# Patient Record
Sex: Female | Born: 1970 | Race: White | Hispanic: No | Marital: Married | Smoking: Former smoker
Health system: Southern US, Community
[De-identification: ages and names within clinical notes are randomized; demographics above are authoritative.]

## PROBLEM LIST (undated history)

## (undated) ENCOUNTER — Emergency Department (HOSPITAL_COMMUNITY): Admission: EM | Payer: PRIVATE HEALTH INSURANCE | Source: Home / Self Care

## (undated) DIAGNOSIS — K760 Fatty (change of) liver, not elsewhere classified: Secondary | ICD-10-CM

## (undated) DIAGNOSIS — R7303 Prediabetes: Secondary | ICD-10-CM

## (undated) DIAGNOSIS — C73 Malignant neoplasm of thyroid gland: Secondary | ICD-10-CM

## (undated) DIAGNOSIS — F419 Anxiety disorder, unspecified: Secondary | ICD-10-CM

## (undated) DIAGNOSIS — F32A Depression, unspecified: Secondary | ICD-10-CM

## (undated) DIAGNOSIS — G5600 Carpal tunnel syndrome, unspecified upper limb: Secondary | ICD-10-CM

## (undated) DIAGNOSIS — M1990 Unspecified osteoarthritis, unspecified site: Secondary | ICD-10-CM

## (undated) DIAGNOSIS — G8929 Other chronic pain: Secondary | ICD-10-CM

## (undated) DIAGNOSIS — M549 Dorsalgia, unspecified: Secondary | ICD-10-CM

## (undated) DIAGNOSIS — K219 Gastro-esophageal reflux disease without esophagitis: Secondary | ICD-10-CM

## (undated) DIAGNOSIS — I10 Essential (primary) hypertension: Secondary | ICD-10-CM

## (undated) DIAGNOSIS — R1310 Dysphagia, unspecified: Secondary | ICD-10-CM

## (undated) DIAGNOSIS — F329 Major depressive disorder, single episode, unspecified: Secondary | ICD-10-CM

## (undated) DIAGNOSIS — E7800 Pure hypercholesterolemia, unspecified: Secondary | ICD-10-CM

## (undated) DIAGNOSIS — Z87442 Personal history of urinary calculi: Secondary | ICD-10-CM

## (undated) DIAGNOSIS — M79606 Pain in leg, unspecified: Secondary | ICD-10-CM

## (undated) DIAGNOSIS — E0590 Thyrotoxicosis, unspecified without thyrotoxic crisis or storm: Secondary | ICD-10-CM

## (undated) DIAGNOSIS — R011 Cardiac murmur, unspecified: Secondary | ICD-10-CM

## (undated) DIAGNOSIS — K5792 Diverticulitis of intestine, part unspecified, without perforation or abscess without bleeding: Secondary | ICD-10-CM

## (undated) DIAGNOSIS — G4730 Sleep apnea, unspecified: Secondary | ICD-10-CM

## (undated) DIAGNOSIS — M2550 Pain in unspecified joint: Secondary | ICD-10-CM

## (undated) DIAGNOSIS — E039 Hypothyroidism, unspecified: Secondary | ICD-10-CM

## (undated) DIAGNOSIS — M25562 Pain in left knee: Secondary | ICD-10-CM

## (undated) HISTORY — DX: Malignant neoplasm of thyroid gland: C73

## (undated) HISTORY — PX: MASS EXCISION: SHX2000

## (undated) HISTORY — DX: Pure hypercholesterolemia, unspecified: E78.00

## (undated) HISTORY — DX: Prediabetes: R73.03

## (undated) HISTORY — DX: Pain in unspecified joint: M25.50

## (undated) HISTORY — PX: KNEE SURGERY: SHX244

## (undated) HISTORY — DX: Thyrotoxicosis, unspecified without thyrotoxic crisis or storm: E05.90

## (undated) HISTORY — DX: Dysphagia, unspecified: R13.10

## (undated) HISTORY — PX: CHOLECYSTECTOMY: SHX55

## (undated) HISTORY — DX: Dorsalgia, unspecified: M54.9

## (undated) HISTORY — PX: COLONOSCOPY: SHX174

## (undated) HISTORY — DX: Fatty (change of) liver, not elsewhere classified: K76.0

## (undated) HISTORY — PX: ACNE CYST REMOVAL: SUR1112

---

## 1996-09-18 HISTORY — PX: DILATION AND CURETTAGE OF UTERUS: SHX78

## 1998-06-16 ENCOUNTER — Ambulatory Visit: Admission: RE | Admit: 1998-06-16 | Discharge: 1998-06-16 | Payer: Self-pay | Admitting: Pulmonary Disease

## 1999-06-13 ENCOUNTER — Other Ambulatory Visit: Admission: RE | Admit: 1999-06-13 | Discharge: 1999-06-13 | Payer: Self-pay | Admitting: Family Medicine

## 2000-12-26 ENCOUNTER — Other Ambulatory Visit: Admission: RE | Admit: 2000-12-26 | Discharge: 2000-12-26 | Payer: Self-pay | Admitting: Family Medicine

## 2001-07-23 ENCOUNTER — Emergency Department (HOSPITAL_COMMUNITY): Admission: EM | Admit: 2001-07-23 | Discharge: 2001-07-23 | Payer: Self-pay | Admitting: Emergency Medicine

## 2002-12-30 ENCOUNTER — Other Ambulatory Visit: Admission: RE | Admit: 2002-12-30 | Discharge: 2002-12-30 | Payer: Self-pay | Admitting: Family Medicine

## 2003-04-23 ENCOUNTER — Emergency Department (HOSPITAL_COMMUNITY): Admission: EM | Admit: 2003-04-23 | Discharge: 2003-04-23 | Payer: Self-pay | Admitting: Emergency Medicine

## 2003-04-23 ENCOUNTER — Encounter: Payer: Self-pay | Admitting: Emergency Medicine

## 2004-04-12 ENCOUNTER — Other Ambulatory Visit: Admission: RE | Admit: 2004-04-12 | Discharge: 2004-04-12 | Payer: Self-pay | Admitting: Obstetrics and Gynecology

## 2004-04-29 ENCOUNTER — Emergency Department (HOSPITAL_COMMUNITY): Admission: EM | Admit: 2004-04-29 | Discharge: 2004-04-29 | Payer: Self-pay | Admitting: Emergency Medicine

## 2004-12-12 ENCOUNTER — Emergency Department (HOSPITAL_COMMUNITY): Admission: EM | Admit: 2004-12-12 | Discharge: 2004-12-12 | Payer: Self-pay | Admitting: Emergency Medicine

## 2005-04-25 ENCOUNTER — Emergency Department (HOSPITAL_COMMUNITY): Admission: EM | Admit: 2005-04-25 | Discharge: 2005-04-25 | Payer: Self-pay | Admitting: Emergency Medicine

## 2006-01-16 ENCOUNTER — Emergency Department (HOSPITAL_COMMUNITY): Admission: EM | Admit: 2006-01-16 | Discharge: 2006-01-16 | Payer: Self-pay | Admitting: Emergency Medicine

## 2006-09-13 ENCOUNTER — Other Ambulatory Visit: Admission: RE | Admit: 2006-09-13 | Discharge: 2006-09-13 | Payer: Self-pay | Admitting: Family Medicine

## 2006-10-04 ENCOUNTER — Emergency Department (HOSPITAL_COMMUNITY): Admission: EM | Admit: 2006-10-04 | Discharge: 2006-10-04 | Payer: Self-pay

## 2006-10-15 ENCOUNTER — Emergency Department (HOSPITAL_COMMUNITY): Admission: EM | Admit: 2006-10-15 | Discharge: 2006-10-16 | Payer: Self-pay | Admitting: Emergency Medicine

## 2006-10-25 ENCOUNTER — Ambulatory Visit: Admission: RE | Admit: 2006-10-25 | Discharge: 2006-10-25 | Payer: Self-pay | Admitting: Family Medicine

## 2006-10-25 ENCOUNTER — Ambulatory Visit: Payer: Self-pay | Admitting: Vascular Surgery

## 2006-11-08 ENCOUNTER — Encounter (INDEPENDENT_AMBULATORY_CARE_PROVIDER_SITE_OTHER): Payer: Self-pay | Admitting: Specialist

## 2006-11-08 ENCOUNTER — Ambulatory Visit (HOSPITAL_COMMUNITY): Admission: RE | Admit: 2006-11-08 | Discharge: 2006-11-08 | Payer: Self-pay | Admitting: Gastroenterology

## 2006-11-14 ENCOUNTER — Ambulatory Visit: Payer: Self-pay | Admitting: Gastroenterology

## 2006-11-21 ENCOUNTER — Ambulatory Visit: Payer: Self-pay | Admitting: Thoracic Surgery

## 2006-11-22 ENCOUNTER — Ambulatory Visit (HOSPITAL_COMMUNITY): Admission: RE | Admit: 2006-11-22 | Discharge: 2006-11-22 | Payer: Self-pay | Admitting: Thoracic Surgery

## 2006-12-10 ENCOUNTER — Encounter (INDEPENDENT_AMBULATORY_CARE_PROVIDER_SITE_OTHER): Payer: Self-pay | Admitting: Specialist

## 2006-12-10 ENCOUNTER — Inpatient Hospital Stay (HOSPITAL_COMMUNITY): Admission: RE | Admit: 2006-12-10 | Discharge: 2006-12-16 | Payer: Self-pay | Admitting: Thoracic Surgery

## 2006-12-10 ENCOUNTER — Ambulatory Visit: Payer: Self-pay | Admitting: Thoracic Surgery

## 2006-12-19 ENCOUNTER — Encounter: Admission: RE | Admit: 2006-12-19 | Discharge: 2006-12-19 | Payer: Self-pay | Admitting: Thoracic Surgery

## 2006-12-19 ENCOUNTER — Ambulatory Visit: Payer: Self-pay | Admitting: Thoracic Surgery

## 2007-01-09 ENCOUNTER — Ambulatory Visit: Payer: Self-pay | Admitting: Thoracic Surgery

## 2007-01-09 ENCOUNTER — Encounter: Admission: RE | Admit: 2007-01-09 | Discharge: 2007-01-09 | Payer: Self-pay | Admitting: Thoracic Surgery

## 2007-02-20 ENCOUNTER — Ambulatory Visit: Payer: Self-pay | Admitting: Thoracic Surgery

## 2007-02-20 ENCOUNTER — Encounter: Admission: RE | Admit: 2007-02-20 | Discharge: 2007-02-20 | Payer: Self-pay | Admitting: Thoracic Surgery

## 2007-03-19 ENCOUNTER — Ambulatory Visit: Payer: Self-pay | Admitting: Thoracic Surgery

## 2007-03-19 ENCOUNTER — Encounter: Admission: RE | Admit: 2007-03-19 | Discharge: 2007-03-19 | Payer: Self-pay | Admitting: Thoracic Surgery

## 2007-05-09 ENCOUNTER — Encounter: Admission: RE | Admit: 2007-05-09 | Discharge: 2007-05-09 | Payer: Self-pay | Admitting: Thoracic Surgery

## 2007-05-09 ENCOUNTER — Ambulatory Visit: Payer: Self-pay | Admitting: Thoracic Surgery

## 2007-06-20 ENCOUNTER — Ambulatory Visit: Payer: Self-pay | Admitting: Thoracic Surgery

## 2007-06-25 ENCOUNTER — Encounter: Admission: RE | Admit: 2007-06-25 | Discharge: 2007-06-25 | Payer: Self-pay | Admitting: Family Medicine

## 2007-06-25 ENCOUNTER — Other Ambulatory Visit: Admission: RE | Admit: 2007-06-25 | Discharge: 2007-06-25 | Payer: Self-pay | Admitting: Family Medicine

## 2007-08-05 ENCOUNTER — Encounter: Admission: RE | Admit: 2007-08-05 | Discharge: 2007-08-05 | Payer: Self-pay | Admitting: Family Medicine

## 2007-11-11 ENCOUNTER — Emergency Department (HOSPITAL_COMMUNITY): Admission: EM | Admit: 2007-11-11 | Discharge: 2007-11-12 | Payer: Self-pay | Admitting: Emergency Medicine

## 2008-10-09 ENCOUNTER — Other Ambulatory Visit: Admission: RE | Admit: 2008-10-09 | Discharge: 2008-10-09 | Payer: Self-pay | Admitting: Family Medicine

## 2008-11-09 ENCOUNTER — Encounter: Admission: RE | Admit: 2008-11-09 | Discharge: 2008-11-09 | Payer: Self-pay | Admitting: Family Medicine

## 2008-11-12 ENCOUNTER — Encounter: Admission: RE | Admit: 2008-11-12 | Discharge: 2008-12-17 | Payer: Self-pay | Admitting: Emergency Medicine

## 2009-02-08 ENCOUNTER — Encounter (INDEPENDENT_AMBULATORY_CARE_PROVIDER_SITE_OTHER): Payer: Self-pay | Admitting: General Surgery

## 2009-02-08 ENCOUNTER — Ambulatory Visit (HOSPITAL_BASED_OUTPATIENT_CLINIC_OR_DEPARTMENT_OTHER): Admission: RE | Admit: 2009-02-08 | Discharge: 2009-02-08 | Payer: Self-pay | Admitting: General Surgery

## 2009-10-19 ENCOUNTER — Emergency Department (HOSPITAL_COMMUNITY): Admission: EM | Admit: 2009-10-19 | Discharge: 2009-10-19 | Payer: Self-pay | Admitting: Emergency Medicine

## 2010-01-24 ENCOUNTER — Other Ambulatory Visit: Admission: RE | Admit: 2010-01-24 | Discharge: 2010-01-24 | Payer: Self-pay | Admitting: Family Medicine

## 2010-12-08 LAB — COMPREHENSIVE METABOLIC PANEL
ALT: 31 U/L (ref 0–35)
AST: 28 U/L (ref 0–37)
Albumin: 4.2 g/dL (ref 3.5–5.2)
Alkaline Phosphatase: 47 U/L (ref 39–117)
BUN: 11 mg/dL (ref 6–23)
CO2: 30 mEq/L (ref 19–32)
Calcium: 9.6 mg/dL (ref 8.4–10.5)
Chloride: 101 mEq/L (ref 96–112)
Creatinine, Ser: 0.59 mg/dL (ref 0.4–1.2)
GFR calc Af Amer: >60 mL/min (ref >60)
GFR calc non Af Amer: >60 mL/min (ref >60)
Glucose, Bld: 98 mg/dL (ref 70–99)
Potassium: 3.6 mEq/L (ref 3.5–5.1)
Sodium: 136 mEq/L (ref 135–145)
Total Bilirubin: 0.7 mg/dL (ref 0.3–1.2)
Total Protein: 7.4 g/dL (ref 6.0–8.3)

## 2010-12-08 LAB — DIFFERENTIAL
Basophils Absolute: 0 10*3/uL (ref 0.0–0.1)
Basophils Relative: 0 % (ref 0–1)
Eosinophils Absolute: 0.1 10*3/uL (ref 0.0–0.7)
Eosinophils Relative: 2 % (ref 0–5)
Lymphocytes Relative: 35 % (ref 12–46)
Lymphs Abs: 2.4 10*3/uL (ref 0.7–4.0)
Monocytes Absolute: 0.5 10*3/uL (ref 0.1–1.0)
Monocytes Relative: 7 % (ref 3–12)
Neutro Abs: 3.9 10*3/uL (ref 1.7–7.7)
Neutrophils Relative %: 56 % (ref 43–77)

## 2010-12-08 LAB — POCT CARDIAC MARKERS
CKMB, poc: <1 ng/mL — ABNORMAL LOW (ref 1.0–8.0)
Myoglobin, poc: 43.4 ng/mL (ref 12–200)
Troponin i, poc: <0.05 ng/mL (ref 0.00–0.09)

## 2010-12-08 LAB — CBC
HCT: 39.4 % (ref 36.0–46.0)
Hemoglobin: 13.4 g/dL (ref 12.0–15.0)
MCHC: 34.1 g/dL (ref 30.0–36.0)
MCV: 93.6 fL (ref 78.0–100.0)
Platelets: 262 10*3/uL (ref 150–400)
RBC: 4.21 MIL/uL (ref 3.87–5.11)
RDW: 14.1 % (ref 11.5–15.5)
WBC: 7 10*3/uL (ref 4.0–10.5)

## 2010-12-08 LAB — D-DIMER, QUANTITATIVE (NOT AT ARMC): D-Dimer, Quant: <0.22 ug/mL-FEU (ref 0.00–0.48)

## 2011-01-03 ENCOUNTER — Other Ambulatory Visit: Payer: Self-pay | Admitting: Gastroenterology

## 2011-01-09 ENCOUNTER — Ambulatory Visit
Admission: RE | Admit: 2011-01-09 | Discharge: 2011-01-09 | Disposition: A | Discharge status: Home / Self Care | Payer: Managed Care, Other (non HMO) | Source: Ambulatory Visit | Attending: Gastroenterology | Admitting: Gastroenterology

## 2011-01-09 ENCOUNTER — Other Ambulatory Visit: Payer: Self-pay | Admitting: Gastroenterology

## 2011-01-10 ENCOUNTER — Emergency Department (HOSPITAL_COMMUNITY)
Admission: EM | Admit: 2011-01-10 | Discharge: 2011-01-10 | Disposition: A | Discharge status: Home / Self Care | Payer: Managed Care, Other (non HMO) | Attending: Emergency Medicine | Admitting: Emergency Medicine

## 2011-01-10 DIAGNOSIS — F411 Generalized anxiety disorder: Secondary | ICD-10-CM | POA: Insufficient documentation

## 2011-01-10 DIAGNOSIS — I10 Essential (primary) hypertension: Secondary | ICD-10-CM | POA: Insufficient documentation

## 2011-01-10 DIAGNOSIS — F329 Major depressive disorder, single episode, unspecified: Secondary | ICD-10-CM | POA: Insufficient documentation

## 2011-01-10 DIAGNOSIS — L02219 Cutaneous abscess of trunk, unspecified: Secondary | ICD-10-CM | POA: Insufficient documentation

## 2011-01-10 DIAGNOSIS — F3289 Other specified depressive episodes: Secondary | ICD-10-CM | POA: Insufficient documentation

## 2011-01-10 DIAGNOSIS — Z79899 Other long term (current) drug therapy: Secondary | ICD-10-CM | POA: Insufficient documentation

## 2011-01-16 ENCOUNTER — Ambulatory Visit
Admission: RE | Admit: 2011-01-16 | Discharge: 2011-01-16 | Disposition: A | Discharge status: Home / Self Care | Payer: Managed Care, Other (non HMO) | Source: Ambulatory Visit | Attending: Gastroenterology | Admitting: Gastroenterology

## 2011-01-23 ENCOUNTER — Other Ambulatory Visit: Payer: Self-pay | Admitting: Gastroenterology

## 2011-01-23 DIAGNOSIS — D219 Benign neoplasm of connective and other soft tissue, unspecified: Secondary | ICD-10-CM

## 2011-01-27 ENCOUNTER — Other Ambulatory Visit: Payer: Managed Care, Other (non HMO)

## 2011-01-31 NOTE — Assessment & Plan Note (Signed)
OFFICE VISIT   Christine Mason, Christine Mason  DOB:  [birth year]                                        June 20, 2007  CHART #:  [id number]   OFFICE NOTE   SUBJECTIVE:  Blood pressure is 120/76, pulse 79, respirations 18,  saturation 96 percent.  She still has some mild post-orchotomy pain and  still some itching of her scars despite the Kenalog.  She says this may  be a little better with treatment.  I told her to gradually increase her  activities and I would see her back again if she continues to have  problems.  I told her this should gradually get better.   Ines Bloomer, M.D.  Electronically Signed   DPB/MEDQ  D:  06/20/2007  T:  06/21/2007  Job:  [id number]

## 2011-01-31 NOTE — Assessment & Plan Note (Signed)
OFFICE VISIT   KINDA, POTTLE  DOB:  [birth year]                                        May 09, 2007  CHART #:  [id number]   She came in today complaining of pain in her chest and itching at her  scar.   PHYSICAL EXAMINATION:  CHEST:  Clear to auscultation and percussion. Her  chest x-ray was improved.  VITAL SIGNS:  Blood pressure 134/90, pulse 82, respirations 18,  saturations 98%.   She has some whiting of her scar, but I do not think that she has  developed any really keloid formation. I did give her a prescription for  Kenalog to put on the scar and we will check her back again in a couple  of months.   Ines Bloomer, M.D.  Electronically Signed   DPB/MEDQ  D:  05/09/2007  T:  05/11/2007  Job:  [id number]

## 2011-01-31 NOTE — Assessment & Plan Note (Signed)
OFFICE VISIT   Christine Mason, Christine Mason  DOB:  1971/09/03                                        March 19, 2007  CHART #:  [id number]   Her blood pressure was 138/92, pulse 88, respirations 18, sats were 98%.  Her incision is well-healed. Her post thoracotomy pain is decreasing.  Her chest x-ray showed normal changes. She is doing remarkably well. I  plan to see her back again in 6 weeks with a chest x-ray.   Ines Bloomer, M.Mason.  Electronically Signed   DPB/MEDQ  Mason:  03/19/2007  T:  03/20/2007  Job:  [id number]

## 2011-01-31 NOTE — Op Note (Signed)
Christine Mason, Christine Mason NO.:  [id number]   MEDICAL RECORD NO.:  [id number]          PATIENT TYPE:  AMB   LOCATION:  DSC                          FACILITY:  [facility]   PHYSICIAN:  Angelia Mould. Derrell Lolling, M.D.DATE OF BIRTH:  [birth year]   DATE OF PROCEDURE:  02/08/2009  DATE OF DISCHARGE:                               OPERATIVE REPORT   PREOPERATIVE DIAGNOSIS:  Soft tissue mass, left upper arm.   POSTOPERATIVE DIAGNOSIS:  A 2-cm soft tissue mass, left upper arm.   OPERATION PERFORMED:  Excision of 2-cm soft tissue mass left upper arm  with layered closure.   SURGEON:  Angelia Mould. Derrell Lolling, MD   OPERATIVE INDICATIONS:  This is a 40 year old Caucasian female who  states that she has had a lump in her left upper arm for 10 years.  She  thinks she got a vaccination there many years ago.  She has always had a  lump.  Recently it has become inflamed and she has had to take courses  of antibiotics, but it has never drained any purulent fluid or required  any surgery.  The skin is chronically discolored and she wants this  excised.  On exam she has got about a 1.8 x 2.5-cm soft tissue mass  overlying the lateral aspect of the left deltoid muscle.  There is some  chronic overlying skin discoloration.  It is very firm, slightly tender.  No evidence of active infection.  On physical exam, this seems to be  more likely a chronic inflammatory or fibrotic process and seems less  likely to be a malignancy.  She wanted to have this area excised.  She  was brought to the operating room electively.   OPERATIVE TECHNIQUE:  The patient was brought to [facility]  and brought to minor surgery room.  The patient was identified as the  correct patient, correct procedure, and correct site.  She was placed in  a right lateral decubitus position with appropriate padding.  The left  upper arm and deltoid area was prepped and draped in a sterile fashion.  Using a marking pen, I chose a  transverse elliptical incision.  She was  anesthetized with 1% Xylocaine with epinephrine mixed with sodium  bicarbonate.  This took about 20 mL, but we had excellent anesthesia.  Transverse elliptical incision was made through the skin down into the  deep subcutaneous tissue around this very firm palpable mass.  It  appeared to be surrounded by benign fatty tissue.  It did not involve  the muscle.  It was sent for routine histology.  Hemostasis was  excellent.  The deep subcutaneous tissue was closed with interrupted  sutures of 3-0 Vicryl and the skin closed with running simple suture of  3-0 nylon.  Clean bandages were placed, and the patient taken recovery  room in stable condition.  Estimated blood loss was about 5 mL to 10 mL.  Complications were none.  Sponge, needle, and instrument counts were  correct.      Angelia Mould. Derrell Lolling, M.D.  Electronically Signed     HMI/MEDQ  D:  02/08/2009  T:  02/09/2009  Job:  [id number]   cc:   [name], PA

## 2011-01-31 NOTE — Assessment & Plan Note (Signed)
OFFICE VISIT   Christine Mason, HEINER D  DOB:  Jun 09, 1971                                        February 20, 2007  CHART #:  [id number]   Her blood pressure is 142/94, pulse 96, respirations 18, sats are 99%.  Her lung incision was well-healed. She is still having a lot of post  thoracotomy pain since she started working again.   I gave her a prescription for Tylox as well as Celebrex 200 mg twice a  day. I will see her back again in 3 to 4 weeks with a chest x-ray. I  told her to let us know if her pain gets worse and we would add Lyrica  or Neurontin. 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   Ines Bloomer, M.D.  Electronically Signed   DPB/MEDQ  D:  02/20/2007  T:  02/20/2007  Job:  [id number]

## 2011-02-01 ENCOUNTER — Ambulatory Visit
Admission: RE | Admit: 2011-02-01 | Discharge: 2011-02-01 | Disposition: A | Discharge status: Home / Self Care | Payer: Managed Care, Other (non HMO) | Source: Ambulatory Visit | Attending: Gastroenterology | Admitting: Gastroenterology

## 2011-02-01 ENCOUNTER — Other Ambulatory Visit: Payer: Managed Care, Other (non HMO)

## 2011-02-01 DIAGNOSIS — D219 Benign neoplasm of connective and other soft tissue, unspecified: Secondary | ICD-10-CM

## 2011-02-01 MED ORDER — IOHEXOL 300 MG/ML  SOLN
75.0000 mL | Freq: Once | INTRAMUSCULAR | Status: AC | PRN
  Administered 2011-02-01: 75 mL via INTRAVENOUS

## 2011-02-03 NOTE — Discharge Summary (Signed)
NAMELENEE, FRANZE NO.:  [id number]   MEDICAL RECORD NO.:  [id number]          PATIENT TYPE:  INP   LOCATION:  3306                         FACILITY:  [facility]   PHYSICIAN:  Ines Bloomer, M.D. DATE OF BIRTH:  07-10-1971   DATE OF ADMISSION:  12/10/2006  DATE OF DISCHARGE:  12/15/2006                               DISCHARGE SUMMARY   HISTORY OF PRESENT ILLNESS:  The patient is a 40 year old female who has  recently been evaluated for an esophageal mass.  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  She was  referred to Dr. Edwyna Shell for resection.   PAST MEDICAL HISTORY:  Significant for:  1. Morbid obesity.  2. Hypertension.   ALLERGIES:  PENICILLIN CAUSES A RASH.   MEDICATIONS PRIOR TO ADMISSION:  Included:  1. Valtrex 1 gram 3 times daily.  2. Atenolol 75 mg daily.  3. Norvasc 5 mg daily.  4. Hydrochlorothiazide 12.5 mg daily.  5. Lasix 40 mg daily.  6. Diclofenac 75 mg 2 daily.  7. Xanax 1 daily, dosage not available.  8. Phenergan once daily, dosage not available.  9. Multivitamin 1 daily.   PAST SURGICAL HISTORY:  1. Cholecystectomy.  2. D&C.   FAMILY HISTORY:  Please see the history and physical done at the time of  admission.   SOCIAL HISTORY:  Please see the history and physical done at the time of  admission.   REVIEW OF SYSTEMS:  Please see the history and physical done at the time  of admission.   PHYSICAL EXAMINATION:  Please see the history and physical done at the  time of admission.   HOSPITAL COURSE:  Patient was admitted electively on December 10, 2006 and  underwent a right thoracotomy for resection of leiomyoma of the  esophagus.  The patient tolerated the procedure well and was taken to  the postanesthesia care unit in stable condition.   POSTOPERATIVE HOSPITAL COURSE:  The patient has done well.  She has  maintained stable hemodynamics.  She has been tachycardic at times, but  her atenolol  has now been restarted.  She underwent a swallowing study  on December 12, 2006, which was unremarkable for leak.  Her diet has slowly  been advanced in the routine manner.  Her laboratory values have been  monitored closely.  She has had some hypokalemia requiring  supplementation.  Her oxygen is being weaned and currently she maintains  saturations of 99% on 1 liter.  She has tolerated a gradual increase in  activity as commenced for level of postoperative, lessens using standard  protocols.  Her morbid obesity is somewhat limiting due to  deconditioning, both preoperatively and her postoperative status.  Her  incision is healing well without evidence of infection.  All routine  lines, monitors and drainage devices have been discontinued in the  standard fashion.  She does currently have 1 chest tube, but it is felt  that this will be removed in the next day or so.  Tentatively, she is  felt to be stable for discharge in the morning  of December 15, 2006 pending  morning round reevaluation.   MEDICATIONS AT DISCHARGE:  Will be as preoperatively, to include:  1. Valtrex 1 gram 3 times daily.  2. Atenolol 75 mg daily.  3. Norvasc 5 mg daily.  4. Hydrochlorothiazide 12.5 mg daily.  5. Lasix 40 mg daily p.r.n.  6. Diclofenac 75 mg 2 tablets daily.  7. Xanax as used previously as needed.  8. Phenergan as used previously as needed.  9. Multivitamin as used previously as needed.  10.For pain, she will be given a prescription for Tylox 1 or 2 every 4-      6 hours as needed.   INSTRUCTIONS:  The patient received written instructions in regard to  medications, activity, diet, wound care and followup.   PATHOLOGY:  The final pathology is currently pending to the chart.   FINAL DIAGNOSIS:  Leiomyoma of the esophagus per previous biopsy with  pathology pending as described above.   OTHER DIAGNOSES:  As previously listed per the history.   FOLLOWUP APPOINTMENT:  Will be arranged for the patient  to see Dr.  Edwyna Shell in 1 week for a chest x-ray.   INSTRUCTIONS:  The patient will receive written instructions regarding  medications, activity, diet, wound care and followup.      Rowe Clack, P.A.-C.      Ines Bloomer, M.D.  Electronically Signed   WEG/MEDQ  D:  12/14/2006  T:  12/14/2006  Job:  [id number]   cc:   Lendon Ka, M.D.
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
## 2011-02-23 ENCOUNTER — Emergency Department (HOSPITAL_COMMUNITY)
Admission: EM | Admit: 2011-02-23 | Discharge: 2011-02-23 | Disposition: A | Discharge status: Home / Self Care | Payer: Managed Care, Other (non HMO) | Attending: Emergency Medicine | Admitting: Emergency Medicine

## 2011-02-23 DIAGNOSIS — R609 Edema, unspecified: Secondary | ICD-10-CM | POA: Insufficient documentation

## 2011-02-23 DIAGNOSIS — R0789 Other chest pain: Secondary | ICD-10-CM | POA: Insufficient documentation

## 2011-02-23 DIAGNOSIS — I10 Essential (primary) hypertension: Secondary | ICD-10-CM | POA: Insufficient documentation

## 2011-02-23 LAB — GLUCOSE, CAPILLARY: Glucose-Capillary: 94 mg/dL (ref 70–99)

## 2011-03-07 ENCOUNTER — Other Ambulatory Visit (HOSPITAL_COMMUNITY): Payer: Self-pay | Admitting: Family Medicine

## 2011-03-07 DIAGNOSIS — Z139 Encounter for screening, unspecified: Secondary | ICD-10-CM

## 2011-03-13 ENCOUNTER — Ambulatory Visit (HOSPITAL_COMMUNITY): Payer: Managed Care, Other (non HMO)

## 2011-03-16 ENCOUNTER — Ambulatory Visit (HOSPITAL_COMMUNITY)
Admission: RE | Admit: 2011-03-16 | Discharge: 2011-03-16 | Disposition: A | Discharge status: Home / Self Care | Payer: Managed Care, Other (non HMO) | Source: Ambulatory Visit | Attending: Family Medicine | Admitting: Family Medicine

## 2011-03-16 DIAGNOSIS — Z139 Encounter for screening, unspecified: Secondary | ICD-10-CM

## 2011-03-16 DIAGNOSIS — Z1231 Encounter for screening mammogram for malignant neoplasm of breast: Secondary | ICD-10-CM | POA: Insufficient documentation

## 2011-03-20 ENCOUNTER — Emergency Department (HOSPITAL_COMMUNITY): Payer: Managed Care, Other (non HMO)

## 2011-03-20 ENCOUNTER — Emergency Department (HOSPITAL_COMMUNITY)
Admission: EM | Admit: 2011-03-20 | Discharge: 2011-03-21 | Disposition: A | Discharge status: Home / Self Care | Payer: Managed Care, Other (non HMO) | Attending: Emergency Medicine | Admitting: Emergency Medicine

## 2011-03-20 DIAGNOSIS — R109 Unspecified abdominal pain: Secondary | ICD-10-CM | POA: Insufficient documentation

## 2011-03-20 DIAGNOSIS — I10 Essential (primary) hypertension: Secondary | ICD-10-CM | POA: Insufficient documentation

## 2011-03-20 DIAGNOSIS — K5732 Diverticulitis of large intestine without perforation or abscess without bleeding: Secondary | ICD-10-CM | POA: Insufficient documentation

## 2011-03-20 DIAGNOSIS — Z79899 Other long term (current) drug therapy: Secondary | ICD-10-CM | POA: Insufficient documentation

## 2011-03-20 DIAGNOSIS — F3289 Other specified depressive episodes: Secondary | ICD-10-CM | POA: Insufficient documentation

## 2011-03-20 DIAGNOSIS — F329 Major depressive disorder, single episode, unspecified: Secondary | ICD-10-CM | POA: Insufficient documentation

## 2011-03-20 DIAGNOSIS — F411 Generalized anxiety disorder: Secondary | ICD-10-CM | POA: Insufficient documentation

## 2011-03-20 DIAGNOSIS — R509 Fever, unspecified: Secondary | ICD-10-CM | POA: Insufficient documentation

## 2011-03-20 DIAGNOSIS — R112 Nausea with vomiting, unspecified: Secondary | ICD-10-CM | POA: Insufficient documentation

## 2011-03-20 HISTORY — DX: Essential (primary) hypertension: I10

## 2011-03-20 LAB — URINALYSIS, ROUTINE W REFLEX MICROSCOPIC
Bilirubin Urine: NEGATIVE
Glucose, UA: NEGATIVE mg/dL
Hgb urine dipstick: NEGATIVE
Ketones, ur: NEGATIVE mg/dL
Leukocytes, UA: NEGATIVE
Nitrite: NEGATIVE
Protein, ur: NEGATIVE mg/dL
Specific Gravity, Urine: >1.03 — ABNORMAL HIGH (ref 1.005–1.030)
Urobilinogen, UA: 0.2 mg/dL (ref 0.0–1.0)
pH: 5.5 (ref 5.0–8.0)

## 2011-03-20 LAB — CBC
HCT: 38.2 % (ref 36.0–46.0)
Hemoglobin: 12.8 g/dL (ref 12.0–15.0)
MCH: 30.5 pg (ref 26.0–34.0)
MCHC: 33.5 g/dL (ref 30.0–36.0)
MCV: 91 fL (ref 78.0–100.0)
Platelets: 315 10*3/uL (ref 150–400)
RBC: 4.2 MIL/uL (ref 3.87–5.11)
RDW: 13.7 % (ref 11.5–15.5)
WBC: 11.7 10*3/uL — ABNORMAL HIGH (ref 4.0–10.5)

## 2011-03-20 LAB — DIFFERENTIAL
Basophils Absolute: 0 10*3/uL (ref 0.0–0.1)
Basophils Relative: 0 % (ref 0–1)
Eosinophils Absolute: 0.1 10*3/uL (ref 0.0–0.7)
Eosinophils Relative: 1 % (ref 0–5)
Lymphocytes Relative: 20 % (ref 12–46)
Lymphs Abs: 2.4 10*3/uL (ref 0.7–4.0)
Monocytes Absolute: 0.9 10*3/uL (ref 0.1–1.0)
Monocytes Relative: 8 % (ref 3–12)
Neutro Abs: 8.3 10*3/uL — ABNORMAL HIGH (ref 1.7–7.7)
Neutrophils Relative %: 71 % (ref 43–77)

## 2011-03-21 ENCOUNTER — Emergency Department (HOSPITAL_COMMUNITY): Payer: Managed Care, Other (non HMO)

## 2011-03-21 ENCOUNTER — Encounter (HOSPITAL_COMMUNITY): Payer: Self-pay | Admitting: Radiology

## 2011-03-21 MED ORDER — IOHEXOL 300 MG/ML  SOLN
100.0000 mL | Freq: Once | INTRAMUSCULAR | Status: AC | PRN
  Administered 2011-03-21: 100 mL via INTRAVENOUS

## 2011-05-08 ENCOUNTER — Other Ambulatory Visit: Payer: Self-pay | Admitting: Gastroenterology

## 2011-06-02 ENCOUNTER — Emergency Department (HOSPITAL_COMMUNITY): Payer: Managed Care, Other (non HMO)

## 2011-06-02 ENCOUNTER — Encounter (HOSPITAL_COMMUNITY): Payer: Self-pay | Admitting: Emergency Medicine

## 2011-06-02 ENCOUNTER — Emergency Department (HOSPITAL_COMMUNITY)
Admission: EM | Admit: 2011-06-02 | Discharge: 2011-06-02 | Disposition: A | Discharge status: Home / Self Care | Payer: Managed Care, Other (non HMO) | Attending: Emergency Medicine | Admitting: Emergency Medicine

## 2011-06-02 DIAGNOSIS — F329 Major depressive disorder, single episode, unspecified: Secondary | ICD-10-CM | POA: Insufficient documentation

## 2011-06-02 DIAGNOSIS — I10 Essential (primary) hypertension: Secondary | ICD-10-CM | POA: Insufficient documentation

## 2011-06-02 DIAGNOSIS — F3289 Other specified depressive episodes: Secondary | ICD-10-CM | POA: Insufficient documentation

## 2011-06-02 DIAGNOSIS — M1710 Unilateral primary osteoarthritis, unspecified knee: Secondary | ICD-10-CM | POA: Insufficient documentation

## 2011-06-02 DIAGNOSIS — IMO0002 Reserved for concepts with insufficient information to code with codable children: Secondary | ICD-10-CM | POA: Insufficient documentation

## 2011-06-02 DIAGNOSIS — F17200 Nicotine dependence, unspecified, uncomplicated: Secondary | ICD-10-CM | POA: Insufficient documentation

## 2011-06-02 DIAGNOSIS — K219 Gastro-esophageal reflux disease without esophagitis: Secondary | ICD-10-CM | POA: Insufficient documentation

## 2011-06-02 HISTORY — DX: Depression, unspecified: F32.A

## 2011-06-02 HISTORY — DX: Major depressive disorder, single episode, unspecified: F32.9

## 2011-06-02 HISTORY — DX: Gastro-esophageal reflux disease without esophagitis: K21.9

## 2011-06-02 MED ORDER — IBUPROFEN 800 MG PO TABS
800.0000 mg | ORAL_TABLET | Freq: Once | ORAL | Status: AC
  Administered 2011-06-02: 800 mg via ORAL
  Filled 2011-06-02: qty 1

## 2011-06-02 MED ORDER — IBUPROFEN 800 MG PO TABS: 800.0000 mg | ORAL_TABLET | Freq: Three times a day (TID) | ORAL | Status: AC

## 2011-06-02 MED ORDER — HYDROCODONE-ACETAMINOPHEN 5-325 MG PO TABS
1.0000 | ORAL_TABLET | Freq: Once | ORAL | Status: AC
  Administered 2011-06-02: 1 via ORAL
  Filled 2011-06-02: qty 1

## 2011-06-02 NOTE — ED Notes (Signed)
Patient c/o left leg pain/knee pain. Patient reports hearing a popping noise while trying to get in the car. Patient also reports recently seen by ortho MD had a cortisone injection on Monday. Patient states "He thought I could have a meniscus tear but it didn't show up on x-ray. I was supposed to go back in 2 weeks and if it wasn't any better he was going to do a MRI."

## 2011-06-02 NOTE — ED Provider Notes (Signed)
History     CSN: [id number] Arrival date & time: 06/02/2011  3:55 PM   Chief Complaint  Patient presents with  . Leg Pain     (Include location/radiation/quality/duration/timing/severity/associated sxs/prior treatment) HPI Comments: Pt was getting into her car.  She swung her legs into the passenger compartment and felt a "POP" but doesn't know if it came from her L knee or L hip.  Patient is a 40 y.o. female presenting with leg pain. The history is provided by the patient. No language interpreter was used.  Leg Pain  The incident occurred less than 1 hour ago. The incident occurred at home. The pain is present in the left knee, left thigh and left hip. The pain is at a severity of 7/10. The pain has been intermittent since onset. Associated symptoms include inability to bear weight. She reports no foreign bodies present. The symptoms are aggravated by bearing weight. She has tried nothing for the symptoms. The treatment provided no relief.     Past Medical History  Diagnosis Date  . Hypertension   . GERD (gastroesophageal reflux disease)   . Depression      Past Surgical History  Procedure Date  . Acne cyst removal   . Mass excision   . Cholecystectomy   . Dilation and curettage of uterus 1998    Family History  Problem Relation Age of Onset  . Diabetes Mother   . Hypertension Mother   . Cancer Mother   . Diabetes Father   . Hypertension Father   . Coronary artery disease Father   . Hypertension Sister   . Cancer Other     History  Substance Use Topics  . Smoking status: Current Everyday Smoker -- 0.2 packs/day for 1 years    Types: Cigarettes  . Smokeless tobacco: Never Used  . Alcohol Use: No    OB History    Grav Para Term Preterm Abortions TAB SAB Ect Mult Living   1    1  1    0      Review of Systems  Musculoskeletal: Positive for arthralgias.  All other systems reviewed and are negative.    Allergies  Penicillins  Home Medications    Current Outpatient Rx  Name Route Sig Dispense Refill  . ACETAMINOPHEN 500 MG PO TABS Oral Take 500 mg by mouth as needed. For pain     . BUPROPION HCL (XL) 150 MG PO TB24 Oral Take 150 mg by mouth daily.      Marland Kitchen HYDROCHLOROTHIAZIDE 25 MG PO TABS Oral Take 25 mg by mouth daily.      Marland Kitchen HYDROCODONE-ACETAMINOPHEN 5-500 MG PO TABS Oral Take 1 tablet by mouth at bedtime as needed. For pain     . ALIVE WOMENS ENERGY PO Oral Take 1 tablet by mouth daily.      Marland Kitchen PANTOPRAZOLE SODIUM 40 MG PO TBEC Oral Take 40 mg by mouth daily.      . SERTRALINE HCL 50 MG PO TABS Oral Take 25 mg by mouth daily.      . B COMPLEX PO TABS Oral Take 1 tablet by mouth daily.        Physical Exam    BP 152/83  Pulse 89  Temp(Src) 97.9 F (36.6 C) (Oral)  Resp 20  Ht 5\' 3"  (1.6 m)  Wt 280 lb (127.007 kg)  BMI 49.60 kg/m2  SpO2 98%  LMP 05/18/2011  Physical Exam  Nursing note and vitals reviewed. Constitutional: She is oriented to  person, place, and time. Vital signs are normal. She appears well-developed and well-nourished. No distress.  HENT:  Head: Normocephalic and atraumatic.  Right Ear: External ear normal.  Left Ear: External ear normal.  Nose: Nose normal.  Mouth/Throat: No oropharyngeal exudate.  Eyes: Conjunctivae and EOM are normal. Pupils are equal, round, and reactive to light. Right eye exhibits no discharge. Left eye exhibits no discharge. No scleral icterus.  Neck: Normal range of motion. Neck supple. No JVD present. No tracheal deviation present. No thyromegaly present.  Cardiovascular: Normal rate, regular rhythm, normal heart sounds, intact distal pulses and normal pulses.  Exam reveals no gallop and no friction rub.   No murmur heard. Pulmonary/Chest: Effort normal and breath sounds normal. No stridor. No respiratory distress. She has no wheezes. She has no rales. She exhibits no tenderness.  Abdominal: Soft. Normal appearance and bowel sounds are normal. She exhibits no distension and no  mass. There is no tenderness. There is no rebound and no guarding.  Musculoskeletal: Normal range of motion. She exhibits tenderness. She exhibits no edema.       Left upper leg: She exhibits tenderness and bony tenderness. She exhibits no swelling, no deformity and no laceration.       Legs: Lymphadenopathy:    She has no cervical adenopathy.  Neurological: She is alert and oriented to person, place, and time. She has normal strength and normal reflexes. No cranial nerve deficit or sensory deficit. Coordination normal. GCS eye subscore is 4. GCS verbal subscore is 5. GCS motor subscore is 6.  Skin: Skin is warm and dry. No rash noted. She is not diaphoretic.  Psychiatric: She has a normal mood and affect. Her speech is normal and behavior is normal. Judgment and thought content normal. Cognition and memory are normal.    ED Course  Procedures  Results for orders placed during the hospital encounter of 03/20/11  DIFFERENTIAL      Component Value Range   Neutrophils Relative 71  43 - 77 (%)   Neutro Abs 8.3 (*) 1.7 - 7.7 (K/uL)   Lymphocytes Relative 20  12 - 46 (%)   Lymphs Abs 2.4  0.7 - 4.0 (K/uL)   Monocytes Relative 8  3 - 12 (%)   Monocytes Absolute 0.9  0.1 - 1.0 (K/uL)   Eosinophils Relative 1  0 - 5 (%)   Eosinophils Absolute 0.1  0.0 - 0.7 (K/uL)   Basophils Relative 0  0 - 1 (%)   Basophils Absolute 0.0  0.0 - 0.1 (K/uL)  CBC      Component Value Range   WBC 11.7 (*) 4.0 - 10.5 (K/uL)   RBC 4.20  3.87 - 5.11 (MIL/uL)   Hemoglobin 12.8  12.0 - 15.0 (g/dL)   HCT 40.9  81.1 - 91.4 (%)   MCV 91.0  78.0 - 100.0 (fL)   MCH 30.5  26.0 - 34.0 (pg)   MCHC 33.5  30.0 - 36.0 (g/dL)   RDW 78.2  95.6 - 21.3 (%)   Platelets 315  150 - 400 (K/uL)  URINALYSIS, ROUTINE W REFLEX MICROSCOPIC      Component Value Range   Color, Urine YELLOW  YELLOW    Appearance CLEAR  CLEAR    Specific Gravity, Urine >1.030 (*) 1.005 - 1.030    pH 5.5  5.0 - 8.0    Glucose, UA NEGATIVE  NEGATIVE  (mg/dL)   Hgb urine dipstick NEGATIVE  NEGATIVE    Bilirubin Urine NEGATIVE  NEGATIVE    Ketones, ur NEGATIVE  NEGATIVE (mg/dL)   Protein, ur NEGATIVE  NEGATIVE (mg/dL)   Urobilinogen, UA 0.2  0.0 - 1.0 (mg/dL)   Nitrite NEGATIVE  NEGATIVE    Leukocytes, UA NEGATIVE  NEGATIVE    No results found.   1. Degenerative joint disease of knee, left      MDM        Worthy Rancher, PA 06/02/11 1708  Worthy Rancher, PA 06/02/11 1712  Worthy Rancher, PA 07/01/11 1429

## 2011-06-13 ENCOUNTER — Other Ambulatory Visit (HOSPITAL_COMMUNITY): Payer: Self-pay | Admitting: Orthopedic Surgery

## 2011-06-13 DIAGNOSIS — M25569 Pain in unspecified knee: Secondary | ICD-10-CM

## 2011-06-16 ENCOUNTER — Ambulatory Visit (HOSPITAL_COMMUNITY)
Admission: RE | Admit: 2011-06-16 | Discharge: 2011-06-16 | Disposition: A | Discharge status: Home / Self Care | Payer: Managed Care, Other (non HMO) | Source: Ambulatory Visit | Attending: Orthopedic Surgery | Admitting: Orthopedic Surgery

## 2011-06-16 DIAGNOSIS — M2240 Chondromalacia patellae, unspecified knee: Secondary | ICD-10-CM | POA: Insufficient documentation

## 2011-06-16 DIAGNOSIS — M25569 Pain in unspecified knee: Secondary | ICD-10-CM

## 2011-06-16 DIAGNOSIS — M23305 Other meniscus derangements, unspecified medial meniscus, unspecified knee: Secondary | ICD-10-CM | POA: Insufficient documentation

## 2011-06-16 DIAGNOSIS — M25469 Effusion, unspecified knee: Secondary | ICD-10-CM | POA: Insufficient documentation

## 2011-07-06 NOTE — ED Provider Notes (Signed)
Medical screening examination/treatment/procedure(s) were performed by non-physician practitioner and as supervising physician I was immediately available for consultation/collaboration.  Donnetta Hutching, MD 07/06/11 2201

## 2012-02-14 ENCOUNTER — Other Ambulatory Visit (HOSPITAL_COMMUNITY): Payer: Self-pay | Admitting: Family Medicine

## 2012-02-14 DIAGNOSIS — Z139 Encounter for screening, unspecified: Secondary | ICD-10-CM

## 2012-03-05 ENCOUNTER — Encounter (HOSPITAL_COMMUNITY): Payer: Self-pay

## 2012-03-05 ENCOUNTER — Emergency Department (HOSPITAL_COMMUNITY)
Admission: EM | Admit: 2012-03-05 | Discharge: 2012-03-05 | Disposition: A | Discharge status: Home / Self Care | Payer: Self-pay | Attending: Emergency Medicine | Admitting: Emergency Medicine

## 2012-03-05 DIAGNOSIS — R110 Nausea: Secondary | ICD-10-CM | POA: Insufficient documentation

## 2012-03-05 DIAGNOSIS — R197 Diarrhea, unspecified: Secondary | ICD-10-CM | POA: Insufficient documentation

## 2012-03-05 DIAGNOSIS — K529 Noninfective gastroenteritis and colitis, unspecified: Secondary | ICD-10-CM

## 2012-03-05 DIAGNOSIS — K5289 Other specified noninfective gastroenteritis and colitis: Secondary | ICD-10-CM | POA: Insufficient documentation

## 2012-03-05 DIAGNOSIS — Z79899 Other long term (current) drug therapy: Secondary | ICD-10-CM | POA: Insufficient documentation

## 2012-03-05 LAB — CBC
HCT: 41.4 % (ref 36.0–46.0)
Hemoglobin: 13.6 g/dL (ref 12.0–15.0)
MCH: 30.1 pg (ref 26.0–34.0)
MCHC: 32.9 g/dL (ref 30.0–36.0)
MCV: 91.6 fL (ref 78.0–100.0)
Platelets: 334 10*3/uL (ref 150–400)
RBC: 4.52 MIL/uL (ref 3.87–5.11)
RDW: 13.7 % (ref 11.5–15.5)
WBC: 9.7 10*3/uL (ref 4.0–10.5)

## 2012-03-05 LAB — URINALYSIS, ROUTINE W REFLEX MICROSCOPIC
Glucose, UA: NEGATIVE mg/dL
Ketones, ur: NEGATIVE mg/dL
Nitrite: NEGATIVE
Protein, ur: 30 mg/dL — AB
Specific Gravity, Urine: 1.025 (ref 1.005–1.030)
Urobilinogen, UA: 0.2 mg/dL (ref 0.0–1.0)
pH: 6 (ref 5.0–8.0)

## 2012-03-05 LAB — DIFFERENTIAL
Basophils Absolute: 0 10*3/uL (ref 0.0–0.1)
Basophils Relative: 0 % (ref 0–1)
Eosinophils Absolute: 0.1 10*3/uL (ref 0.0–0.7)
Eosinophils Relative: 1 % (ref 0–5)
Lymphocytes Relative: 18 % (ref 12–46)
Lymphs Abs: 1.7 10*3/uL (ref 0.7–4.0)
Monocytes Absolute: 0.6 10*3/uL (ref 0.1–1.0)
Monocytes Relative: 7 % (ref 3–12)
Neutro Abs: 7.2 10*3/uL (ref 1.7–7.7)
Neutrophils Relative %: 75 % (ref 43–77)

## 2012-03-05 LAB — URINE MICROSCOPIC-ADD ON

## 2012-03-05 LAB — COMPREHENSIVE METABOLIC PANEL
ALT: 18 U/L (ref 0–35)
AST: 21 U/L (ref 0–37)
Albumin: 4.1 g/dL (ref 3.5–5.2)
Alkaline Phosphatase: 68 U/L (ref 39–117)
BUN: 8 mg/dL (ref 6–23)
CO2: 24 mEq/L (ref 19–32)
Calcium: 9.8 mg/dL (ref 8.4–10.5)
Chloride: 103 mEq/L (ref 96–112)
Creatinine, Ser: 0.65 mg/dL (ref 0.50–1.10)
GFR calc Af Amer: >90 mL/min (ref >90)
GFR calc non Af Amer: >90 mL/min (ref >90)
Glucose, Bld: 102 mg/dL — ABNORMAL HIGH (ref 70–99)
Potassium: 3.8 mEq/L (ref 3.5–5.1)
Sodium: 139 mEq/L (ref 135–145)
Total Bilirubin: 0.5 mg/dL (ref 0.3–1.2)
Total Protein: 7.8 g/dL (ref 6.0–8.3)

## 2012-03-05 MED ORDER — PROMETHAZINE HCL 25 MG PO TABS: 25.0000 mg | ORAL_TABLET | Freq: Four times a day (QID) | ORAL | Status: DC | PRN

## 2012-03-05 MED ORDER — KETOROLAC TROMETHAMINE 30 MG/ML IJ SOLN
30.0000 mg | Freq: Once | INTRAMUSCULAR | Status: AC
  Administered 2012-03-05: 30 mg via INTRAVENOUS
  Filled 2012-03-05: qty 1

## 2012-03-05 MED ORDER — SODIUM CHLORIDE 0.9 % IV SOLN
Freq: Once | INTRAVENOUS | Status: AC
  Administered 2012-03-05: 10:00:00 via INTRAVENOUS

## 2012-03-05 MED ORDER — ONDANSETRON HCL 4 MG/2ML IJ SOLN
4.0000 mg | Freq: Once | INTRAMUSCULAR | Status: AC
  Administered 2012-03-05: 4 mg via INTRAVENOUS
  Filled 2012-03-05: qty 2

## 2012-03-05 NOTE — ED Provider Notes (Signed)
History   This chart was scribed for Geoffery Lyons, MD by Clarita Crane. The patient was seen in room APA03/APA03. Patient's care was started at 0926.    CSN: [id number]  Arrival date & time 03/05/12  7829   First MD Initiated Contact with Patient 03/05/12 [phone]      Chief Complaint  Patient presents with  . Diarrhea    (Consider location/radiation/quality/duration/timing/severity/associated sxs/prior treatment) HPI Christine Mason is a 41 y.o. female who presents to the Emergency Department complaining of constant moderate to severe nausea and diarrhea onset 2 days ago and persistent since with associated abdominal pain, fever, decreased appetite, generalized weakness, low back pain and 1 episode of mild hematochezia. Patient notes she is currently on Keflex and Diflucan to treat an abscess and yeast infection. Denies vomiting, dysuria, cough, chills. Patient with h/o HTN, GERD, cholecystectomy.  Past Medical History  Diagnosis Date  . Hypertension   . GERD (gastroesophageal reflux disease)   . Depression     Past Surgical History  Procedure Date  . Acne cyst removal   . Mass excision   . Cholecystectomy   . Dilation and curettage of uterus 1998    Family History  Problem Relation Age of Onset  . Diabetes Mother   . Hypertension Mother   . Cancer Mother   . Diabetes Father   . Hypertension Father   . Coronary artery disease Father   . Hypertension Sister   . Cancer Other     History  Substance Use Topics  . Smoking status: Current Everyday Smoker -- 0.2 packs/day for 1 years    Types: Cigarettes  . Smokeless tobacco: Never Used  . Alcohol Use: No    OB History    Grav Para Term Preterm Abortions TAB SAB Ect Mult Living   1    1  1    0      Review of Systems A complete 10 system review of systems was obtained and all systems are negative except as noted in the HPI and PMH.   Allergies  Penicillins  Home Medications   Current Outpatient Rx  Name Route  Sig Dispense Refill  . ACETAMINOPHEN 500 MG PO TABS Oral Take 500 mg by mouth as needed. For pain     . B COMPLEX PO TABS Oral Take 1 tablet by mouth daily.      . BUPROPION HCL ER (XL) 150 MG PO TB24 Oral Take 150 mg by mouth daily.      Marland Kitchen HYDROCHLOROTHIAZIDE 25 MG PO TABS Oral Take 25 mg by mouth daily.      Marland Kitchen HYDROCODONE-ACETAMINOPHEN 5-500 MG PO TABS Oral Take 1 tablet by mouth at bedtime as needed. For pain     . ALIVE WOMENS ENERGY PO Oral Take 1 tablet by mouth daily.      Marland Kitchen PANTOPRAZOLE SODIUM 40 MG PO TBEC Oral Take 40 mg by mouth daily.      . SERTRALINE HCL 50 MG PO TABS Oral Take 25 mg by mouth daily.        LMP 03/03/2012  Physical Exam  Nursing note and vitals reviewed. Constitutional: She is oriented to person, place, and time. She appears well-developed and well-nourished. No distress.  HENT:  Head: Normocephalic and atraumatic.  Eyes: EOM are normal. Pupils are equal, round, and reactive to light.  Neck: Neck supple. No tracheal deviation present.  Cardiovascular: Normal rate and regular rhythm.  Exam reveals no gallop and no friction rub.  No murmur heard. Pulmonary/Chest: Effort normal. No respiratory distress. She has no wheezes. She has no rales.  Abdominal: Soft. She exhibits no distension. There is tenderness (mild LLQ and RLQ). There is no rebound, no guarding and no CVA tenderness.  Musculoskeletal: Normal range of motion. She exhibits no edema.  Neurological: She is alert and oriented to person, place, and time. No sensory deficit.  Skin: Skin is warm and dry.  Psychiatric: She has a normal mood and affect. Her behavior is normal.    ED Course  Procedures (including critical care time)  DIAGNOSTIC STUDIES: Oxygen Saturation is 100% on room air, normal by my interpretation.    COORDINATION OF CARE: 9:45AM-Patient informed of current plan for treatment and evaluation and agrees with plan at this time.     Labs Reviewed - No data to display No results  found.   No diagnosis found.    MDM  The labs are reassuring and the patient was hydrated with normal saline.  She will be discharged to home with phenergan prn for her nausea, return prn if worsens.        I personally performed the services described in this documentation, which was scribed in my presence. The recorded information has been reviewed and considered.      Geoffery Lyons, MD 03/05/12 1122

## 2012-03-05 NOTE — Discharge Instructions (Signed)
Viral Gastroenteritis Viral gastroenteritis is also known as stomach flu. This condition affects the stomach and intestinal tract. It can cause sudden diarrhea and vomiting. The illness typically lasts 3 to 8 days. Most people develop an immune response that eventually gets rid of the virus. While this natural response develops, the virus can make you quite ill. CAUSES  Many different viruses can cause gastroenteritis, such as rotavirus or noroviruses. You can catch one of these viruses by consuming contaminated food or water. You may also catch a virus by sharing utensils or other personal items with an infected person or by touching a contaminated surface. SYMPTOMS  The most common symptoms are diarrhea and vomiting. These problems can cause a severe loss of body fluids (dehydration) and a body salt (electrolyte) imbalance. Other symptoms may include:  Fever.   Headache.   Fatigue.   Abdominal pain.  DIAGNOSIS  Your caregiver can usually diagnose viral gastroenteritis based on your symptoms and a physical exam. A stool sample may also be taken to test for the presence of viruses or other infections. TREATMENT  This illness typically goes away on its own. Treatments are aimed at rehydration. The most serious cases of viral gastroenteritis involve vomiting so severely that you are not able to keep fluids down. In these cases, fluids must be given through an intravenous line (IV). HOME CARE INSTRUCTIONS   Drink enough fluids to keep your urine clear or pale yellow. Drink small amounts of fluids frequently and increase the amounts as tolerated.   Ask your caregiver for specific rehydration instructions.   Avoid:   Foods high in sugar.   Alcohol.   Carbonated drinks.   Tobacco.   Juice.   Caffeine drinks.   Extremely hot or cold fluids.   Fatty, greasy foods.   Too much intake of anything at one time.   Dairy products until 24 to 48 hours after diarrhea stops.   You may  consume probiotics. Probiotics are active cultures of beneficial bacteria. They may lessen the amount and number of diarrheal stools in adults. Probiotics can be found in yogurt with active cultures and in supplements.   Wash your hands well to avoid spreading the virus.   Only take over-the-counter or prescription medicines for pain, discomfort, or fever as directed by your caregiver. Do not give aspirin to children. Antidiarrheal medicines are not recommended.   Ask your caregiver if you should continue to take your regular prescribed and over-the-counter medicines.   Keep all follow-up appointments as directed by your caregiver.  SEEK IMMEDIATE MEDICAL CARE IF:   You are unable to keep fluids down.   You do not urinate at least once every 6 to 8 hours.   You develop shortness of breath.   You notice blood in your stool or vomit. This may look like coffee grounds.   You have abdominal pain that increases or is concentrated in one small area (localized).   You have persistent vomiting or diarrhea.   You have a fever.   The patient is a child younger than 3 months, and he or she has a fever.   The patient is a child older than 3 months, and he or she has a fever and persistent symptoms.   The patient is a child older than 3 months, and he or she has a fever and symptoms suddenly get worse.   The patient is a baby, and he or she has no tears when crying.  MAKE SURE YOU:     Understand these instructions.   Will watch your condition.   Will get help right away if you are not doing well or get worse.  Document Released: 09/04/2005 Document Revised: 08/24/2011 Document Reviewed: 06/21/2011 [employer] Patient Information 2012 [employer]. 

## 2012-03-05 NOTE — ED Notes (Signed)
Pt reports lower back pain, fever, abd pain, and diarrhea since Sunday.  Pt says has been taking keflex for boil last week.  Also taking diflucan for yeast infection.

## 2012-04-11 ENCOUNTER — Ambulatory Visit (HOSPITAL_COMMUNITY): Payer: Self-pay

## 2012-04-22 ENCOUNTER — Ambulatory Visit (HOSPITAL_COMMUNITY): Payer: Self-pay

## 2012-04-22 ENCOUNTER — Ambulatory Visit (HOSPITAL_COMMUNITY)
Admission: RE | Admit: 2012-04-22 | Discharge: 2012-04-22 | Disposition: A | Discharge status: Home / Self Care | Payer: Self-pay | Source: Ambulatory Visit | Attending: Family Medicine | Admitting: Family Medicine

## 2012-04-22 DIAGNOSIS — Z139 Encounter for screening, unspecified: Secondary | ICD-10-CM

## 2012-08-18 ENCOUNTER — Encounter (HOSPITAL_COMMUNITY): Payer: Self-pay | Admitting: Emergency Medicine

## 2012-08-18 ENCOUNTER — Emergency Department (HOSPITAL_COMMUNITY)
Admission: EM | Admit: 2012-08-18 | Discharge: 2012-08-18 | Disposition: A | Discharge status: Home / Self Care | Payer: Self-pay | Attending: Emergency Medicine | Admitting: Emergency Medicine

## 2012-08-18 DIAGNOSIS — Z87891 Personal history of nicotine dependence: Secondary | ICD-10-CM | POA: Insufficient documentation

## 2012-08-18 DIAGNOSIS — R21 Rash and other nonspecific skin eruption: Secondary | ICD-10-CM | POA: Insufficient documentation

## 2012-08-18 DIAGNOSIS — I10 Essential (primary) hypertension: Secondary | ICD-10-CM | POA: Insufficient documentation

## 2012-08-18 DIAGNOSIS — K219 Gastro-esophageal reflux disease without esophagitis: Secondary | ICD-10-CM | POA: Insufficient documentation

## 2012-08-18 DIAGNOSIS — Z7982 Long term (current) use of aspirin: Secondary | ICD-10-CM | POA: Insufficient documentation

## 2012-08-18 DIAGNOSIS — Z791 Long term (current) use of non-steroidal anti-inflammatories (NSAID): Secondary | ICD-10-CM | POA: Insufficient documentation

## 2012-08-18 DIAGNOSIS — F329 Major depressive disorder, single episode, unspecified: Secondary | ICD-10-CM | POA: Insufficient documentation

## 2012-08-18 DIAGNOSIS — Z79899 Other long term (current) drug therapy: Secondary | ICD-10-CM | POA: Insufficient documentation

## 2012-08-18 DIAGNOSIS — F3289 Other specified depressive episodes: Secondary | ICD-10-CM | POA: Insufficient documentation

## 2012-08-18 DIAGNOSIS — L299 Pruritus, unspecified: Secondary | ICD-10-CM | POA: Insufficient documentation

## 2012-08-18 MED ORDER — NYSTATIN-TRIAMCINOLONE 100000-0.1 UNIT/GM-% EX CREA: TOPICAL_CREAM | CUTANEOUS | Status: DC

## 2012-08-18 NOTE — ED Notes (Signed)
Pt c/o rash under bilateral breast x 2 weeks.

## 2012-08-18 NOTE — ED Notes (Signed)
Pt presents to ED with rash underneath breasts bilaterally. Pt denies pain but reports "constant itching". Pt states rash has been present x2 weeks.

## 2012-08-19 NOTE — ED Provider Notes (Signed)
Medical screening examination/treatment/procedure(s) were performed by non-physician practitioner and as supervising physician I was immediately available for consultation/collaboration.   Jamicheal Heard, MD 08/19/12 1824 

## 2012-08-19 NOTE — ED Provider Notes (Signed)
History     CSN: [id number]  Arrival date & time 08/18/12  1753   First MD Initiated Contact with Patient 08/18/12 1856      Chief Complaint  Patient presents with  . Rash    (Consider location/radiation/quality/duration/timing/severity/associated sxs/prior treatment) HPI Comments: Christine Mason presents with an itchy rash underneath her breasts which has been present for the past several weeks.  She describes a chronic moisture problem as she is large breasted and started using Johnson's baby powder about a week prior to the development of the rash.  Since,  She has tried gold bond powder without improvement in the rash or the pruritis.  She denies fevers, chills. pain and has had no drainage from the rash.  The history is provided by the patient and a parent.    Past Medical History  Diagnosis Date  . Hypertension   . GERD (gastroesophageal reflux disease)   . Depression     Past Surgical History  Procedure Date  . Acne cyst removal   . Mass excision   . Cholecystectomy   . Dilation and curettage of uterus 1998    Family History  Problem Relation Age of Onset  . Diabetes Mother   . Hypertension Mother   . Cancer Mother   . Diabetes Father   . Hypertension Father   . Coronary artery disease Father   . Hypertension Sister   . Cancer Other     History  Substance Use Topics  . Smoking status: Former Smoker -- 0.2 packs/day for 1 years    Types: Cigarettes  . Smokeless tobacco: Never Used  . Alcohol Use: No    OB History    Grav Para Term Preterm Abortions TAB SAB Ect Mult Living   1    1  1    0      Review of Systems  Constitutional: Negative for fever and chills.  HENT: Negative for facial swelling.   Respiratory: Negative for shortness of breath and wheezing.   Skin: Positive for rash.  Neurological: Negative for numbness.    Allergies  Penicillins  Home Medications   Current Outpatient Rx  Name  Route  Sig  Dispense  Refill  .  ACETAMINOPHEN 500 MG PO TABS   Oral   Take 500 mg by mouth as needed. For pain          . ASPIRIN EC 81 MG PO TBEC   Oral   Take 81 mg by mouth daily.         . B COMPLEX PO TABS   Oral   Take 1 tablet by mouth daily.           . BUPROPION HCL ER (XL) 300 MG PO TB24   Oral   Take 300 mg by mouth daily.         Marland Kitchen CLONIDINE HCL 0.2 MG PO TABS   Oral   Take 0.2 mg by mouth 2 (two) times daily.         Marland Kitchen HYDROCHLOROTHIAZIDE 25 MG PO TABS   Oral   Take 25 mg by mouth daily.           . IBUPROFEN 800 MG PO TABS   Oral   Take 800 mg by mouth 3 (three) times daily.         Marland Kitchen ALIVE WOMENS ENERGY PO   Oral   Take 1 tablet by mouth daily.           Marland Kitchen  PANTOPRAZOLE SODIUM 40 MG PO TBEC   Oral   Take 40 mg by mouth daily.           . NYSTATIN-TRIAMCINOLONE 100000-0.1 UNIT/GM-% EX CREA      Apply to affected area two times daily   30 g   0   . PROMETHAZINE HCL 25 MG PO TABS   Oral   Take 1 tablet (25 mg total) by mouth every 6 (six) hours as needed for nausea.   10 tablet   0     BP 144/91  Pulse 104  Temp 97.5 F (36.4 C) (Oral)  Resp 18  Ht 5\' 3"  (1.6 m)  Wt 280 lb (127.007 kg)  BMI 49.60 kg/m2  SpO2 99%  LMP 07/28/2012  Physical Exam  Constitutional: She appears well-developed and well-nourished. No distress.  HENT:  Head: Normocephalic.  Neck: Neck supple.  Cardiovascular: Normal rate.   Pulmonary/Chest: Effort normal. She has no wheezes.  Musculoskeletal: Normal range of motion. She exhibits no edema.  Skin: Rash noted. No petechiae noted. Rash is maculopapular. Rash is not pustular and not urticarial. There is erythema.       Moist based near confluent erythema with scattered satellite lesions of intertriginous skin beneath breasts.    ED Course  Procedures (including critical care time)  Labs Reviewed - No data to display No results found.   1. Rash       MDM  Possible tinea infection,  Although sx started after applying  Johnson's baby powder,  Possible contact dermatitis as source of sx.  Prescribed triamcinolone with nystatin.  Encouraged f/u with pcp for recheck if not responding over the next week.        Burgess Amor, PA 08/19/12 1259

## 2013-05-29 ENCOUNTER — Other Ambulatory Visit (HOSPITAL_COMMUNITY)
Admission: RE | Admit: 2013-05-29 | Discharge: 2013-05-29 | Disposition: A | Discharge status: Home / Self Care | Payer: BC Managed Care – PPO | Source: Ambulatory Visit | Attending: Nurse Practitioner | Admitting: Nurse Practitioner

## 2013-05-29 ENCOUNTER — Other Ambulatory Visit: Payer: Self-pay | Admitting: Nurse Practitioner

## 2013-05-29 DIAGNOSIS — Z01419 Encounter for gynecological examination (general) (routine) without abnormal findings: Secondary | ICD-10-CM | POA: Insufficient documentation

## 2013-05-29 DIAGNOSIS — Z1151 Encounter for screening for human papillomavirus (HPV): Secondary | ICD-10-CM | POA: Insufficient documentation

## 2013-06-04 ENCOUNTER — Other Ambulatory Visit (HOSPITAL_COMMUNITY): Payer: Self-pay | Admitting: Family Medicine

## 2013-06-04 DIAGNOSIS — Z139 Encounter for screening, unspecified: Secondary | ICD-10-CM

## 2013-06-05 ENCOUNTER — Other Ambulatory Visit: Payer: Self-pay | Admitting: Family Medicine

## 2013-06-05 DIAGNOSIS — Z803 Family history of malignant neoplasm of breast: Secondary | ICD-10-CM

## 2013-06-05 DIAGNOSIS — Z1231 Encounter for screening mammogram for malignant neoplasm of breast: Secondary | ICD-10-CM

## 2013-06-09 ENCOUNTER — Ambulatory Visit (HOSPITAL_COMMUNITY): Payer: BC Managed Care – PPO

## 2013-06-23 ENCOUNTER — Telehealth: Payer: Self-pay | Admitting: *Deleted

## 2013-06-23 NOTE — Telephone Encounter (Signed)
Pt called stating that she needs to schedule just a blood draw per Clydie Braun.  Confirmed 06/24/13 lab appt w/ pt.  Emailed Clydie Braun for her to bring kit down.

## 2013-06-24 ENCOUNTER — Telehealth: Payer: Self-pay | Admitting: Genetic Counselor

## 2013-06-24 ENCOUNTER — Other Ambulatory Visit: Payer: BC Managed Care – PPO | Admitting: Lab

## 2013-06-24 ENCOUNTER — Ambulatory Visit (HOSPITAL_BASED_OUTPATIENT_CLINIC_OR_DEPARTMENT_OTHER): Payer: BC Managed Care – PPO | Admitting: Genetic Counselor

## 2013-06-24 ENCOUNTER — Encounter: Payer: Self-pay | Admitting: Genetic Counselor

## 2013-06-24 DIAGNOSIS — Z1501 Genetic susceptibility to malignant neoplasm of breast: Secondary | ICD-10-CM

## 2013-06-24 DIAGNOSIS — IMO0002 Reserved for concepts with insufficient information to code with codable children: Secondary | ICD-10-CM

## 2013-06-24 DIAGNOSIS — Z803 Family history of malignant neoplasm of breast: Secondary | ICD-10-CM

## 2013-06-24 NOTE — Telephone Encounter (Signed)
VM said it was the phone number of Christine Mason.  Wrong number.

## 2013-06-24 NOTE — Progress Notes (Signed)
Christine Goodpasture, PA requested a consultation for genetic counseling and risk assessment for Christine Mason, a 42 y.o. 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  She presents to clinic today to discuss the possibility of a genetic predisposition to cancer, and to further clarify her risks, as well as her family members' risks for cancer.   HISTORY OF PRESENT ILLNESS: Christine Mason is a 42 y.o. female with no personal history of cancer.  She had a recent CBE which was normal, and a transvaginal u/s that found uterine fibroids.  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  In her early 82s she had a colonoscopy for abdominal pain, which found diverticulities, but no polyps.  Past Medical History  Diagnosis Date  . Hypertension   . GERD (gastroesophageal reflux disease)   . Depression     Past Surgical History  Procedure Laterality Date  . Acne cyst removal    . Mass excision    . Cholecystectomy    . Dilation and curettage of uterus  1998    History   Social History  . Marital Status: Married    Spouse Name: N/A    Number of Children: 0  . Years of Education: N/A   Occupational History  .     Social History Main Topics  . Smoking status: Current Every Day Smoker -- 0.50 packs/day for 5 years    Types: Cigarettes  . Smokeless tobacco: Never Used  . Alcohol Use: No  . Drug Use: No  . Sexual Activity: None   Other Topics Concern  . None   Social History Narrative  . None    REPRODUCTIVE HISTORY AND PERSONAL RISK ASSESSMENT FACTORS: Menarche was at age 26-13.   perimenopausal Uterus Intact: yes Ovaries Intact: yes G0P0A0, first live birth at age N/A  She has not previously undergone treatment for infertility.   Oral Contraceptive use: 0 years   She has not used HRT in the past.    FAMILY HISTORY:  We obtained a detailed, 4-generation family history.   Significant diagnoses are listed below: Family History  Problem Relation Age of Onset  . Diabetes Mother   . Hypertension Mother   . Breast cancer Mother 25    dx again at 37; PALB2 and CHEK2 positive  . Diabetes Father   . Hypertension Father   . Coronary artery disease Father   . Hypertension Sister   . Thyroid cancer Maternal Uncle 46  . Lung cancer Maternal Grandmother   . Leukemia Maternal Grandfather 75    dx with hairy cell leukemia at 47; CLL at 8  . Lymphoma Maternal Grandfather 82    hodgkins lymphoma  . Brain cancer Paternal Grandmother   . Prostate cancer Paternal Grandfather   . Lung cancer Maternal Aunt 57  . Lymphoma Maternal Uncle 60    follicular lymphoma    Patient's maternal ancestors are of Caucasian descent, and paternal ancestors are of English descent. There is no reported Ashkenazi Jewish ancestry. There is no known consanguinity.  GENETIC COUNSELING ASSESSMENT: Christine Mason is a 42 y.o. 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 We, therefore, discussed and recommended the following at today's visit.   DISCUSSION: We reviewed the characteristics, features and inheritance patterns of hereditary cancer syndromes. We also discussed  genetic testing, including the appropriate family members to test, the process of testing, insurance coverage and turn-around-time for results. We discussed autosomal dominant inheritance and risks for cancers based on the CHEK2 and PALB2 mutations.  Individuals with CHEK2 mutations are at an increased risk for both breast and colon cancer, while those with PALB2 mutations are at increased risk for breast, pancreatic and possibly ovarian cancers.  Some of the risks for cancers are not specified, other than "increased risk".  A recent study of over 300 PALB2 mutation carriers found that the risk for breast cancer was between 40-68% lifetime risk,  based on family history.  Based on the fact that her mother has both mutations, Pier's risk for having both mutations is 25%, having one mutation (either PALB2 or CHEK2) is 50%, and for not carrying either mutation is 25%  PLAN: After considering the risks, benefits, and limitations, JANNETTE COTHAM provided informed consent to pursue genetic testing and the blood sample will be sent to [facility] for analysis of the PALB2 and CHEK2 genes. We discussed the implications of a positive, negative and/ or variant of uncertain significance genetic test result. Results should be available within approximately 3-4 weeks' time, at which point they will be disclosed by telephone to Tristan Schroeder, as will any additional recommendations warranted by these results. KOLINA KUBE will receive a summary of her genetic counseling visit and a copy of her results once available. This information will also be available in Epic. We encouraged JAQUANNA BALLENTINE to remain in contact with cancer genetics annually so that we can continuously update the family history and inform her of any changes in cancer genetics and testing that may be of benefit for her family. Britlee Skolnik Shipley's questions were answered to her satisfaction today. Our contact information was provided should additional questions or concerns arise.  The patient was seen for a total of 35 minutes, greater than 50% of which was spent face-to-face counseling.  This note will also be sent to the referring provider via the electronic medical record. The patient will be supplied with a summary of this genetic counseling discussion as well as educational information on the discussed hereditary cancer syndromes following the conclusion of their visit.   Patient was discussed with Dr. Drue Second.   _______________________________________________________________________ For Office Staff:  Number of people involved in session: 1 Was an Intern/ student  involved with case: no

## 2013-06-24 NOTE — Telephone Encounter (Signed)
A man answered the phone and told me that Kira did not live there.  It is not her home phone.

## 2013-06-24 NOTE — Telephone Encounter (Signed)
Contacted patient to let her know that we need to generate an appointment in the system to be able to document that she had genetic counseling.  She will get her blood drawn and then bring her kit to me and I will see her at 10 AM.

## 2013-06-24 NOTE — Telephone Encounter (Signed)
Received new mobile number for her;

## 2013-07-07 ENCOUNTER — Encounter: Payer: Self-pay | Admitting: Genetic Counselor

## 2013-07-14 ENCOUNTER — Telehealth: Payer: Self-pay | Admitting: Genetic Counselor

## 2013-07-14 NOTE — Telephone Encounter (Signed)
Left message that genetic test results were back and to please call.

## 2013-07-14 NOTE — Telephone Encounter (Signed)
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  Discussed cancer risks associated with CHEK2 mutations including up to a 48% lifetime risk for breast cancer and up to almost a 10% risk for colon cancer.  She will come in on Friday to discuss results.

## 2013-07-18 ENCOUNTER — Encounter: Payer: Self-pay | Admitting: Genetic Counselor

## 2013-07-18 ENCOUNTER — Ambulatory Visit (HOSPITAL_BASED_OUTPATIENT_CLINIC_OR_DEPARTMENT_OTHER): Payer: BC Managed Care – PPO | Admitting: Genetic Counselor

## 2013-07-18 DIAGNOSIS — IMO0002 Reserved for concepts with insufficient information to code with codable children: Secondary | ICD-10-CM

## 2013-07-18 DIAGNOSIS — Z1509 Genetic susceptibility to other malignant neoplasm: Secondary | ICD-10-CM

## 2013-07-18 NOTE — Progress Notes (Signed)
Christine Mason, a 42 y.o. female, was seen for discussion of her genetic test results which found a Christine Mason.  She presents to clinic today to discuss the possibility of a genetic predisposition to cancer, and to further clarify her risks, as well as her family members' risks for cancer.   HISTORY OF PRESENT ILLNESS: Christine Mason is a 42 y.o. female with no personal history of cancer.  Christine Mason has had a previous colonoscopy that was "clean", and is due to be seen again in 2017 or 2018.  Her mother was found to be a double heterozygote for a PALB2 and Christine Mason.  Christine Mason wanted to get a breast MRI to determine her risk for breast cancer, but needed to undergo genetic testing first.  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    Past Medical History  Diagnosis Date  . Hypertension   . GERD (gastroesophageal reflux disease)   . Depression     Past Surgical History  Procedure Laterality Date  . Acne cyst removal    . Mass excision    . Cholecystectomy    . Dilation and curettage of uterus  1998    History   Social History  . Marital Status: Married    Spouse Name: N/A    Number of Children: 0  . Years of Education: N/A   Occupational History  .     Social History Main Topics  . Smoking status: Current Every Day Smoker -- 0.50 packs/day for 5 years    Types: Cigarettes  . Smokeless tobacco: Never Used  . Alcohol Use: No  . Drug Use: No  . Sexual Activity: None   Other Topics Concern  . None   Social History Narrative  . None    FAMILY HISTORY:  We obtained a detailed, 4-generation family history.  Significant diagnoses are listed below: Family History  Problem Relation Age of Onset  . Diabetes Mother   . Hypertension Mother   . Breast cancer Mother 13    dx again at 52; PALB2 and Christine positive  . Diabetes Father   . Hypertension Father   . Coronary artery disease Father   . Hypertension Sister   . Thyroid cancer Maternal  Uncle 46  . Lung cancer Maternal Grandmother   . Leukemia Maternal Grandfather 45    dx with hairy cell leukemia at 59; CLL at 23  . Lymphoma Maternal Grandfather 82    hodgkins lymphoma  . Brain cancer Paternal Grandmother   . Prostate cancer Paternal Grandfather   . Lung cancer Maternal Aunt 57  . Lymphoma Maternal Uncle 60    follicular lymphoma   Patient's ancestors are of Caucasian descent. There is no reported Ashkenazi Jewish ancestry. There is no known consanguinity.  GENETIC COUNSELING ASSESSMENT: Christine Mason is a 42 y.o. female with a personal history of a known Christine Mason which conveys a predisposition to cancer. We, therefore, discussed and recommended the following at today's visit.   DISCUSSION: Christine mutations have been found to be associated with an increased risk of breast and other cancers. The estimated cancer risks vary and may be influenced by family history. Women with a Christine deleterious Mason have approximately a 24% (no family history of breast cancer) to 48% (strong family history of breast cancer) lifetime risk of breast cancer and up to a 25% risk of a second breast cancer. Men may have an increased risk for female breast cancer  of about 1%. Men and women may have an increased risk of colon cancer (~10% lifetime risk). There are no established management guidelines for individuals with disease causing mutations in Christine. However, management options based on other genes associated with high risks of breast cancer (such as the BRCA1 and BRCA2) may be appropriate to follow. Additionally, individuals should discuss when to begin screening colonoscopies and how often they should be performed with a gastroenterologist. We discussed that her relatives will be at risk for this gene Mason.  Her sister has a 50% risk of having this Mason. She is also at risk for a PALB2 Mason, which the patient was found to be negative for.    The patient was seen for a total of  30 minutes, greater than 50% of which was spent face-to-face counseling.  This note will also be sent to the referring provider via the electronic medical record. The patient will be supplied with a summary of this genetic counseling discussion as well as educational information on the discussed hereditary cancer syndromes following the conclusion of their visit.   Patient was discussed with Dr. Drue Second.   _______________________________________________________________________ For Office Staff:  Number of people involved in session: 2 Was an Intern/ student involved with case: no

## 2013-07-24 ENCOUNTER — Other Ambulatory Visit (HOSPITAL_COMMUNITY): Payer: Self-pay | Admitting: Family Medicine

## 2013-07-24 DIAGNOSIS — Z803 Family history of malignant neoplasm of breast: Secondary | ICD-10-CM

## 2013-07-24 DIAGNOSIS — Z1231 Encounter for screening mammogram for malignant neoplasm of breast: Secondary | ICD-10-CM

## 2013-07-28 ENCOUNTER — Telehealth: Payer: Self-pay | Admitting: *Deleted

## 2013-07-28 ENCOUNTER — Ambulatory Visit (HOSPITAL_COMMUNITY): Payer: BC Managed Care – PPO

## 2013-07-28 NOTE — Telephone Encounter (Signed)
Confirmed 09/01/13 appt w/ pt.  Mailed before appt letter, welcome letter & packet to pt.  Called Bobbi at referring to make her aware.  Took paperwork to Med Rec for chart.

## 2013-07-29 ENCOUNTER — Ambulatory Visit (HOSPITAL_COMMUNITY): Payer: BC Managed Care – PPO

## 2013-07-29 ENCOUNTER — Encounter: Payer: Self-pay | Admitting: Genetic Counselor

## 2013-07-31 ENCOUNTER — Encounter: Payer: Self-pay | Admitting: *Deleted

## 2013-07-31 NOTE — Progress Notes (Signed)
Gave chart to [name] to enter labs and give back so I can give to MD. 

## 2013-08-04 ENCOUNTER — Other Ambulatory Visit: Payer: Self-pay | Admitting: *Deleted

## 2013-08-04 ENCOUNTER — Encounter: Payer: Self-pay | Admitting: *Deleted

## 2013-08-04 DIAGNOSIS — Z1501 Genetic susceptibility to malignant neoplasm of breast: Secondary | ICD-10-CM | POA: Insufficient documentation

## 2013-08-04 NOTE — Progress Notes (Signed)
[name] returned chart and I put in Dr. Milta Deiters box.

## 2013-08-17 ENCOUNTER — Emergency Department (HOSPITAL_COMMUNITY): Payer: BC Managed Care – PPO

## 2013-08-17 ENCOUNTER — Encounter (HOSPITAL_COMMUNITY): Payer: Self-pay | Admitting: Emergency Medicine

## 2013-08-17 ENCOUNTER — Emergency Department (HOSPITAL_COMMUNITY)
Admission: EM | Admit: 2013-08-17 | Discharge: 2013-08-17 | Disposition: A | Discharge status: Home / Self Care | Payer: BC Managed Care – PPO | Attending: Emergency Medicine | Admitting: Emergency Medicine

## 2013-08-17 DIAGNOSIS — K219 Gastro-esophageal reflux disease without esophagitis: Secondary | ICD-10-CM | POA: Insufficient documentation

## 2013-08-17 DIAGNOSIS — M129 Arthropathy, unspecified: Secondary | ICD-10-CM | POA: Insufficient documentation

## 2013-08-17 DIAGNOSIS — Z880 Allergy status to penicillin: Secondary | ICD-10-CM | POA: Insufficient documentation

## 2013-08-17 DIAGNOSIS — Z79899 Other long term (current) drug therapy: Secondary | ICD-10-CM | POA: Insufficient documentation

## 2013-08-17 DIAGNOSIS — I10 Essential (primary) hypertension: Secondary | ICD-10-CM | POA: Insufficient documentation

## 2013-08-17 DIAGNOSIS — F329 Major depressive disorder, single episode, unspecified: Secondary | ICD-10-CM | POA: Insufficient documentation

## 2013-08-17 DIAGNOSIS — F17200 Nicotine dependence, unspecified, uncomplicated: Secondary | ICD-10-CM | POA: Insufficient documentation

## 2013-08-17 DIAGNOSIS — F3289 Other specified depressive episodes: Secondary | ICD-10-CM | POA: Insufficient documentation

## 2013-08-17 DIAGNOSIS — Z7982 Long term (current) use of aspirin: Secondary | ICD-10-CM | POA: Insufficient documentation

## 2013-08-17 DIAGNOSIS — M25559 Pain in unspecified hip: Secondary | ICD-10-CM | POA: Insufficient documentation

## 2013-08-17 DIAGNOSIS — G8929 Other chronic pain: Secondary | ICD-10-CM | POA: Insufficient documentation

## 2013-08-17 DIAGNOSIS — M79604 Pain in right leg: Secondary | ICD-10-CM

## 2013-08-17 HISTORY — DX: Other chronic pain: G89.29

## 2013-08-17 HISTORY — DX: Pain in leg, unspecified: M79.606

## 2013-08-17 HISTORY — DX: Pain in left knee: M25.562

## 2013-08-17 NOTE — ED Provider Notes (Addendum)
CSN: [id number]     Arrival date & time 08/17/13  1960 History  This chart was scribed for Toy Baker, MD by Blanchard Kelch, ED Scribe. The patient was seen in room APA19/APA19. Patient's care was started at 8:26 PM.    Chief Complaint  Patient presents with  . Leg Pain    Patient is a 42 y.o. female presenting with leg pain. The history is provided by the patient. No language interpreter was used.  Leg Pain   HPI Comments: Christine Mason is a 42 y.o. female who presents to the Emergency Department complaining of waxing and waning right leg pain that began a few months ago, but worsened a few days ago. Her right leg has had spasms intermittently, causing weakness for the past few days, which is why she came to the ED. She denies dragging her leg when she walks, but has been limping since the pain began. The pain is improved by lying down. She has been seen by orthopedic doctors in [place], who ordered an MRI on her back a few weeks ago. She reports that they stated she has arthritis in her back and disc narrowing based on the MRI, which is what is causing the pain. She had a Cortisone shot in her leg a month ago, which provided two days of relief. She has tried Prednisone for the pain without relief.   Past Medical History  Diagnosis Date  . Hypertension   . GERD (gastroesophageal reflux disease)   . Depression   . Chronic pain of left knee   . Chronic leg pain     right   Past Surgical History  Procedure Laterality Date  . Acne cyst removal    . Mass excision    . Cholecystectomy    . Dilation and curettage of uterus  1998   Family History  Problem Relation Age of Onset  . Diabetes Mother   . Hypertension Mother   . Breast cancer Mother 42    dx again at 27; PALB2 and CHEK2 positive  . Diabetes Father   . Hypertension Father   . Coronary artery disease Father   . Hypertension Sister   . Thyroid cancer Maternal Uncle 46  . Lung cancer Maternal Grandmother   .  Leukemia Maternal Grandfather 35    dx with hairy cell leukemia at 41; CLL at 104  . Lymphoma Maternal Grandfather 82    hodgkins lymphoma  . Brain cancer Paternal Grandmother   . Prostate cancer Paternal Grandfather   . Lung cancer Maternal Aunt 57  . Lymphoma Maternal Uncle 60    follicular lymphoma   History  Substance Use Topics  . Smoking status: Current Every Day Smoker -- 0.50 packs/day for 5 years    Types: Cigarettes  . Smokeless tobacco: Never Used  . Alcohol Use: No   OB History   Grav Para Term Preterm Abortions TAB SAB Ect Mult Living   1    1  1    0     Review of Systems  Cardiovascular: Negative for leg swelling.  Musculoskeletal: Positive for arthralgias and myalgias.  All other systems reviewed and are negative.    Allergies  Penicillins  Home Medications   Current Outpatient Rx  Name  Route  Sig  Dispense  Refill  . aspirin EC 81 MG tablet   Oral   Take 81 mg by mouth daily.         Marland Kitchen b complex vitamins tablet  Oral   Take 1 tablet by mouth daily.           . cloNIDine (CATAPRES) 0.2 MG tablet   Oral   Take 0.2 mg by mouth 2 (two) times daily.         Marland Kitchen ibuprofen (ADVIL,MOTRIN) 800 MG tablet   Oral   Take 800 mg by mouth 3 (three) times daily.         . Multiple Vitamins-Minerals (ALIVE WOMENS ENERGY PO)   Oral   Take 1 tablet by mouth daily.           Marland Kitchen nystatin-triamcinolone (MYCOLOG II) cream      Apply to affected area two times daily   30 g   0   . pantoprazole (PROTONIX) 40 MG tablet   Oral   Take 40 mg by mouth daily.           Marland Kitchen EXPIRED: promethazine (PHENERGAN) 25 MG tablet   Oral   Take 1 tablet (25 mg total) by mouth every 6 (six) hours as needed for nausea.   10 tablet   0    Triage Vitals: BP 139/62  Pulse 100  Temp(Src) 98.3 F (36.8 C) (Oral)  Resp 20  Ht 5\' 3"  (1.6 m)  Wt 285 lb (129.275 kg)  BMI 50.50 kg/m2  SpO2 100%  LMP 07/26/2013  Physical Exam  Nursing note and vitals  reviewed. Constitutional: She is oriented to person, place, and time. She appears well-developed and well-nourished.  Non-toxic appearance. No distress.  HENT:  Head: Normocephalic and atraumatic.  Eyes: Conjunctivae, EOM and lids are normal. Pupils are equal, round, and reactive to light.  Neck: Normal range of motion. Neck supple. No tracheal deviation present. No mass present.  Cardiovascular: Normal rate, regular rhythm and normal heart sounds.  Exam reveals no gallop.   No murmur heard. Pulmonary/Chest: Effort normal and breath sounds normal. No stridor. No respiratory distress. She has no decreased breath sounds. She has no wheezes. She has no rhonchi. She has no rales.  Abdominal: Soft. Normal appearance and bowel sounds are normal. She exhibits no distension. There is no tenderness. There is no rebound and no CVA tenderness.  Musculoskeletal: Normal range of motion. She exhibits no edema and no tenderness.  No swelling of lower extremities. Normal dorsi and plantar flexion. Tender along right iliopsoas band. Slight pain with ROM of right hip but no shortening or deformity.   Neurological: She is alert and oriented to person, place, and time. She has normal strength. No cranial nerve deficit or sensory deficit. GCS eye subscore is 4. GCS verbal subscore is 5. GCS motor subscore is 6.  Reflex Scores:      Patellar reflexes are 2+ on the right side and 2+ on the left side. Skin: Skin is warm and dry. No abrasion and no rash noted.  Psychiatric: She has a normal mood and affect. Her speech is normal and behavior is normal.    ED Course  Procedures (including critical care time)  DIAGNOSTIC STUDIES: Oxygen Saturation is 100% on room air, normal by my interpretation.    COORDINATION OF CARE: 8:31 PM -Recommend follow up with pt's Orthopedic doctor. Will order right hip x-ray. Patient verbalizes understanding and agrees with treatment plan.   Labs Review Labs Reviewed - No data to  display Imaging Review No results found.  EKG Interpretation   None       MDM  No diagnosis found.   I personally performed the  services described in this documentation, which was scribed in my presence. The recorded information has been reviewed and is accurate.  9:20 PM X-rays are negative and patient occurs to followup with her Dr. No asymmetric swelling noted. No concern for DVT. Toy Baker, MD 08/17/13 2120  Toy Baker, MD 08/17/13 2120

## 2013-08-17 NOTE — ED Notes (Signed)
Pt states pain in right leg for several months, pain has become worse. Pt had MRI a week ago. Pt states her leg feels like it is given out.

## 2013-08-17 NOTE — ED Notes (Signed)
Patient complaining of right leg pain x 3 months. Has been seen by orthopedic doctor and PMD. Reports pain to back of right thigh, knee, and back calf. Patient also reports "it is hard to walk sometimes and it feels like it is giving out."

## 2013-08-17 NOTE — ED Notes (Signed)
Pt alert & oriented x4. Patient given discharge instructions, paperwork & prescription(s). Patient instructed to stop at the registration desk to finish any additional paperwork. Patient verbalized understanding. Pt left department w/ no further questions. 

## 2013-09-01 ENCOUNTER — Ambulatory Visit (HOSPITAL_BASED_OUTPATIENT_CLINIC_OR_DEPARTMENT_OTHER): Payer: BC Managed Care – PPO | Admitting: Oncology

## 2013-09-01 ENCOUNTER — Other Ambulatory Visit (HOSPITAL_BASED_OUTPATIENT_CLINIC_OR_DEPARTMENT_OTHER): Payer: BC Managed Care – PPO

## 2013-09-01 ENCOUNTER — Ambulatory Visit: Payer: BC Managed Care – PPO

## 2013-09-01 ENCOUNTER — Telehealth: Payer: Self-pay | Admitting: Oncology

## 2013-09-01 ENCOUNTER — Encounter: Payer: Self-pay | Admitting: *Deleted

## 2013-09-01 ENCOUNTER — Encounter: Payer: Self-pay | Admitting: Oncology

## 2013-09-01 VITALS — BP 124/82 | HR 94 | Temp 98.7°F | Resp 20 | Ht 63.0 in | Wt 288.1 lb

## 2013-09-01 DIAGNOSIS — Z808 Family history of malignant neoplasm of other organs or systems: Secondary | ICD-10-CM

## 2013-09-01 DIAGNOSIS — Z148 Genetic carrier of other disease: Secondary | ICD-10-CM

## 2013-09-01 DIAGNOSIS — Z1239 Encounter for other screening for malignant neoplasm of breast: Secondary | ICD-10-CM

## 2013-09-01 DIAGNOSIS — Z803 Family history of malignant neoplasm of breast: Secondary | ICD-10-CM

## 2013-09-01 DIAGNOSIS — Z1231 Encounter for screening mammogram for malignant neoplasm of breast: Secondary | ICD-10-CM

## 2013-09-01 DIAGNOSIS — Z801 Family history of malignant neoplasm of trachea, bronchus and lung: Secondary | ICD-10-CM

## 2013-09-01 DIAGNOSIS — Z1501 Genetic susceptibility to malignant neoplasm of breast: Secondary | ICD-10-CM

## 2013-09-01 DIAGNOSIS — Z806 Family history of leukemia: Secondary | ICD-10-CM

## 2013-09-01 DIAGNOSIS — Z807 Family history of other malignant neoplasms of lymphoid, hematopoietic and related tissues: Secondary | ICD-10-CM

## 2013-09-01 LAB — COMPREHENSIVE METABOLIC PANEL (CC13)
ALT: 32 U/L (ref 0–55)
AST: 23 U/L (ref 5–34)
Albumin: 3.9 g/dL (ref 3.5–5.0)
Alkaline Phosphatase: 65 U/L (ref 40–150)
Anion Gap: 10 mEq/L (ref 3–11)
BUN: 9.7 mg/dL (ref 7.0–26.0)
CO2: 27 mEq/L (ref 22–29)
Calcium: 10.1 mg/dL (ref 8.4–10.4)
Chloride: 104 mEq/L (ref 98–109)
Creatinine: 0.7 mg/dL (ref 0.6–1.1)
Glucose: 99 mg/dl (ref 70–140)
Potassium: 3.8 mEq/L (ref 3.5–5.1)
Sodium: 141 mEq/L (ref 136–145)
Total Bilirubin: 0.35 mg/dL (ref 0.20–1.20)
Total Protein: 7.3 g/dL (ref 6.4–8.3)

## 2013-09-01 LAB — CBC WITH DIFFERENTIAL/PLATELET
BASO%: 0.9 % (ref 0.0–2.0)
Basophils Absolute: 0.1 10*3/uL (ref 0.0–0.1)
EOS%: 0.8 % (ref 0.0–7.0)
Eosinophils Absolute: 0.1 10*3/uL (ref 0.0–0.5)
HCT: 38.2 % (ref 34.8–46.6)
HGB: 13 g/dL (ref 11.6–15.9)
LYMPH%: 34.3 % (ref 14.0–49.7)
MCH: 31.7 pg (ref 25.1–34.0)
MCHC: 33.9 g/dL (ref 31.5–36.0)
MCV: 93.6 fL (ref 79.5–101.0)
MONO#: 0.7 10*3/uL (ref 0.1–0.9)
MONO%: 9.3 % (ref 0.0–14.0)
NEUT#: 4.3 10*3/uL (ref 1.5–6.5)
NEUT%: 54.7 % (ref 38.4–76.8)
Platelets: 331 10*3/uL (ref 145–400)
RBC: 4.08 10*6/uL (ref 3.70–5.45)
RDW: 13.5 % (ref 11.2–14.5)
WBC: 7.9 10*3/uL (ref 3.9–10.3)
lymph#: 2.7 10*3/uL (ref 0.9–3.3)

## 2013-09-01 NOTE — Telephone Encounter (Signed)
gve the pt her dec 2015 appt calendar °

## 2013-09-01 NOTE — Telephone Encounter (Signed)
S/w the pt and she is aware of her [facility] appt at the [facility]. S/w kathy from [facility] regarding the pt's mri appt. Per Olegario Messier she will call the pt directly since their is some information that she will need from the pt.

## 2013-09-01 NOTE — Progress Notes (Signed)
Checked in new patient with no financial issues. She has appt card and I gave her a [facility] packet. °

## 2013-09-01 NOTE — Telephone Encounter (Signed)
S/w ruth from dr toth's office and they are aware of the referral in the workque and will schedule the appt to see the surgeon and call the pt directly with an appt.

## 2013-09-01 NOTE — Progress Notes (Signed)
Walked to Dr. Milta Deiters office and verified that she has the chart in her box.

## 2013-09-02 ENCOUNTER — Encounter: Payer: Self-pay | Admitting: *Deleted

## 2013-09-02 NOTE — Progress Notes (Signed)
Mailed after appt letter to pt. 

## 2013-09-05 NOTE — Progress Notes (Signed)
[facility]  [phone] [phone]   INITIAL CONSULATION- HIGH RISK PATIENT    Referral MD Dr. 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 New Evaluation    high risk  : CHEK2   HPI: patient is a very pleasant 42 year old female whose family history is significant for mother was found to be double heterozygote for a PALB2 CHEK2 mutation. 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  Patient has been having mammograms performed but has not had an MRI performed. Therefore she is seen in high-risk clinic for discussion of future breast cancer risk reduction. Clinically she is doing well. Most recent mammogram was about a year ago and it was negative. Patient does not to do self breast examinations often but she does try. Patient does not eat as well as she should.  Patient has been seen by genetic counselor Maylon Cos. Will went over [facility] results with the patient. She was informed that there is associated increased risk of breast and other cancers. The estimated cancer risk is influenced by family history. Women who have CHEK2 deleterious mutation R. 24% to 48% lifetime risk of developing breast cancer and up to 25% risk of second breast cancer. And then also may be increased risk for female breast cancer about 1%. There is increased risk of colon cancer to about 10% lifetime risk.        Past Medical History  Diagnosis Date  . Hypertension   . GERD (gastroesophageal reflux disease)   . Depression   . Chronic pain of left knee   . Chronic leg pain     right  :  Past Surgical History  Procedure Laterality Date  . Acne cyst removal    . Mass excision    . Cholecystectomy    . Dilation and curettage of uterus  1998  :  Current Outpatient Prescriptions  Medication Sig Dispense Refill  .  aspirin EC 81 MG tablet Take 81 mg by mouth every morning.       Marland Kitchen b complex vitamins tablet Take 1 tablet by mouth every morning.       . cloNIDine (CATAPRES) 0.2 MG tablet Take 0.2 mg by mouth 2 (two) times daily.      Marland Kitchen gabapentin (NEURONTIN) 300 MG capsule Take 300 mg by mouth at bedtime.      Marland Kitchen HYDROcodone-acetaminophen (NORCO/VICODIN) 5-325 MG per tablet Take 1-2 tablets by mouth at bedtime as needed and may repeat dose one time if needed for moderate pain.      Marland Kitchen ibuprofen (ADVIL,MOTRIN) 800 MG tablet Take 800 mg by mouth 3 (three) times daily.      Marland Kitchen LISINOPRIL-HYDROCHLOROTHIAZIDE PO Take 1 tablet by mouth daily.      . Multiple Vitamins-Minerals (ALIVE WOMENS ENERGY PO) Take 1 tablet by mouth daily.        Marland Kitchen nystatin-triamcinolone (MYCOLOG II) cream Apply to affected area two times daily  30 g  0  . pantoprazole (PROTONIX) 40 MG tablet Take 40 mg by mouth daily.        Marland Kitchen venlafaxine XR (EFFEXOR-XR) 150 MG 24 hr capsule Take 150 mg by mouth every morning.       No current facility-administered medications for this visit.     Allergies  Allergen Reactions  . Penicillins Rash  :  Family History  Problem  Relation Age of Onset  . Diabetes Mother   . Hypertension Mother   . Breast cancer Mother 20    dx again at 18; PALB2 and CHEK2 positive  . Diabetes Father   . Hypertension Father   . Coronary artery disease Father   . Hypertension Sister   . Thyroid cancer Maternal Uncle 46  . Lung cancer Maternal Grandmother   . Leukemia Maternal Grandfather 78    dx with hairy cell leukemia at 12; CLL at 85  . Lymphoma Maternal Grandfather 82    hodgkins lymphoma  . Brain cancer Paternal Grandmother   . Prostate cancer Paternal Grandfather   . Lung cancer Maternal Aunt 57  . Lymphoma Maternal Uncle 60    follicular lymphoma  :  History   Social History  . Marital Status: Married    Spouse Name: N/A    Number of Children: 0  . Years of Education: N/A   Occupational History    .     Social History Main Topics  . Smoking status: Current Every Day Smoker -- 0.50 packs/day for 5 years    Types: Cigarettes  . Smokeless tobacco: Never Used  . Alcohol Use: No  . Drug Use: No  . Sexual Activity: Not on file   Other Topics Concern  . Not on file   Social History Narrative  . No narrative on file  :  A comprehensive review of systems was negative.  Exam: Filed Vitals:   09/01/13 1257  BP: 124/82  Pulse: 94  Temp: 98.7 F (37.1 C)  Resp: 20     General:  well-nourished in no acute distress.  Eyes:  no scleral icterus.  ENT:  There were no oropharyngeal lesions.  Neck was without thyromegaly.  Lymphatics:  Negative cervical, supraclavicular or axillary adenopathy.  Respiratory: lungs were clear bilaterally without wheezing or crackles.  Cardiovascular:  Regular rate and rhythm, S1/S2, without murmur, rub or gallop.  There was no pedal edema.  GI:  abdomen was soft, flat, nontender, nondistended, without organomegaly.  Muscoloskeletal:  no spinal tenderness of palpation of vertebral spine.  Skin exam was without echymosis, petichae.  Neuro exam was nonfocal.  Patient was able to get on and off exam table without assistance.  Gait was normal.  Patient was alerted and oriented.  Attention was good.   Language was appropriate.  Mood was normal without depression.  Speech was not pressured.  Thought content was not tangential.     Lab Results  Component Value Date   WBC 7.9 09/01/2013   HGB 13.0 09/01/2013   HCT 38.2 09/01/2013   PLT 331 09/01/2013   GLUCOSE 99 09/01/2013   ALT 32 09/01/2013   AST 23 09/01/2013   NA 141 09/01/2013   K 3.8 09/01/2013   CL 103 03/05/2012   CREATININE 0.7 09/01/2013   BUN 9.7 09/01/2013   CO2 27 09/01/2013    Dg Hip Complete Right  08/17/2013   CLINICAL DATA:  Right hip pain without trauma.  EXAM: RIGHT HIP - COMPLETE 2+ VIEW  COMPARISON:  None.  FINDINGS: No acute fracture or dislocation. Joint spaces maintained.   IMPRESSION: No acute osseous abnormality.   Electronically Signed   By: Jeronimo Greaves M.D.   On: 08/17/2013 21:11     Dg Hip Complete Right  08/17/2013   CLINICAL DATA:  Right hip pain without trauma.  EXAM: RIGHT HIP - COMPLETE 2+ VIEW  COMPARISON:  None.  FINDINGS: No acute fracture  or dislocation. Joint spaces maintained.  IMPRESSION: No acute osseous abnormality.   Electronically Signed   By: Jeronimo Greaves M.D.   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 Patient has an increased risk for developing breast cancer. We discussed this in detail today. We discussed screening as well as ways of preventing future breast cancer. Certainly she could be screened with MRI and mammogram on a yearly basis. And I have ordered MRI for her today.  We discussed chemoprevention with tamoxifen 20 mg daily. However patient at this time does not want to pursue this.she is more interested in having an MRI performed. We also discussed risk reducing surgeries such as double mastectomy with reconstruction. I have recommended that she be seen by a surgeon and a plastic surgeon for further discussions. I have made referrals for her.  I will plan on seeing the patient back in about 6 months time.  Drue Second, MD Medical/Oncology [facility] [phone] (beeper) [phone] (Office)

## 2013-09-29 ENCOUNTER — Ambulatory Visit (INDEPENDENT_AMBULATORY_CARE_PROVIDER_SITE_OTHER): Payer: BC Managed Care – PPO | Admitting: General Surgery

## 2013-10-08 ENCOUNTER — Ambulatory Visit
Admission: RE | Admit: 2013-10-08 | Discharge: 2013-10-08 | Disposition: A | Discharge status: Home / Self Care | Payer: No Typology Code available for payment source | Source: Ambulatory Visit | Attending: Oncology | Admitting: Oncology

## 2013-10-08 DIAGNOSIS — Z1239 Encounter for other screening for malignant neoplasm of breast: Secondary | ICD-10-CM

## 2013-10-15 ENCOUNTER — Other Ambulatory Visit: Payer: BC Managed Care – PPO

## 2013-11-05 ENCOUNTER — Other Ambulatory Visit (HOSPITAL_COMMUNITY): Payer: Self-pay

## 2013-11-05 DIAGNOSIS — R0683 Snoring: Secondary | ICD-10-CM

## 2013-11-05 DIAGNOSIS — G4710 Hypersomnia, unspecified: Secondary | ICD-10-CM

## 2013-11-05 DIAGNOSIS — G4730 Sleep apnea, unspecified: Secondary | ICD-10-CM

## 2013-11-05 DIAGNOSIS — G4700 Insomnia, unspecified: Secondary | ICD-10-CM

## 2013-11-20 ENCOUNTER — Encounter: Payer: Self-pay | Admitting: Neurology

## 2013-11-20 ENCOUNTER — Ambulatory Visit: Payer: No Typology Code available for payment source | Attending: Family Medicine | Admitting: Sleep Medicine

## 2013-11-20 DIAGNOSIS — G4710 Hypersomnia, unspecified: Secondary | ICD-10-CM

## 2013-11-20 DIAGNOSIS — R0683 Snoring: Secondary | ICD-10-CM

## 2013-11-20 DIAGNOSIS — G4730 Sleep apnea, unspecified: Secondary | ICD-10-CM

## 2013-11-20 DIAGNOSIS — G4700 Insomnia, unspecified: Secondary | ICD-10-CM

## 2013-11-20 DIAGNOSIS — G4733 Obstructive sleep apnea (adult) (pediatric): Secondary | ICD-10-CM | POA: Insufficient documentation

## 2013-11-30 NOTE — Sleep Study (Signed)
  [place] A. Merlene Laughter, MD     [email or web address]          LOCATION: SLEEP LAB FACILITY: [facility]  PHYSICIAN: Elanora Quin A. Merlene Laughter, M.D.   DATE OF STUDY: 11/20/2013  NOCTURNAL POLYSOMNOGRAM   REFERRING PHYSICIAN: Carlos Levering, PA-C.  INDICATIONS: This is a 43 year old presents with hypersomnia, obesity and snoring.  MEDICATIONS:  Prior to Admission medications   Medication Sig Start Date End Date Taking? Authorizing Provider  aspirin EC 81 MG tablet Take 81 mg by mouth every morning.     Historical Provider, MD  b complex vitamins tablet Take 1 tablet by mouth every morning.     Historical Provider, MD  cloNIDine (CATAPRES) 0.2 MG tablet Take 0.2 mg by mouth 2 (two) times daily.    Historical Provider, MD  gabapentin (NEURONTIN) 300 MG capsule Take 300 mg by mouth at bedtime.    Historical Provider, MD  HYDROcodone-acetaminophen (NORCO/VICODIN) 5-325 MG per tablet Take 1-2 tablets by mouth at bedtime as needed and may repeat dose one time if needed for moderate pain.    Historical Provider, MD  ibuprofen (ADVIL,MOTRIN) 800 MG tablet Take 800 mg by mouth 3 (three) times daily.    Historical Provider, MD  LISINOPRIL-HYDROCHLOROTHIAZIDE PO Take 1 tablet by mouth daily.    Historical Provider, MD  Multiple Vitamins-Minerals (ALIVE WOMENS ENERGY PO) Take 1 tablet by mouth daily.      Historical Provider, MD  nystatin-triamcinolone (MYCOLOG II) cream Apply to affected area two times daily 08/18/12   Evalee Jefferson, PA-C  pantoprazole (PROTONIX) 40 MG tablet Take 40 mg by mouth daily.      Historical Provider, MD  venlafaxine XR (EFFEXOR-XR) 150 MG 24 hr capsule Take 150 mg by mouth every morning.    Historical Provider, MD      EPWORTH SLEEPINESS SCALE: 11.   BMI: 49.   ARCHITECTURAL SUMMARY: Total recording time was 400 minutes. Sleep efficiency 64 %. Sleep latency 97 minutes. REM latency 275 minutes. Stage NI 16 %, N2 78 % and N3 5 % and REM sleep  2 %.    RESPIRATORY  DATA:  Baseline oxygen saturation is 98 %. The lowest saturation is 92 %. The diagnostic AHI is  9. The RDI is 9. The REM AHI is 13.  LIMB MOVEMENT SUMMARY: PLM index 0.   ELECTROCARDIOGRAM SUMMARY: Average heart rate is 86. No significant arrhythmias are noted.  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  Thanks for this referral.  Yamato Kopf A. Merlene Laughter, M.D. Diplomat, Tax adviser of Sleep Medicine.

## 2014-02-19 ENCOUNTER — Other Ambulatory Visit: Payer: Self-pay | Admitting: *Deleted

## 2014-02-19 DIAGNOSIS — Z1501 Genetic susceptibility to malignant neoplasm of breast: Secondary | ICD-10-CM

## 2014-02-19 NOTE — Progress Notes (Signed)
Pt called requesting f/u visit with Dr. Humphrey Rolls for high risk clinic.  Informed pt that Dr. Humphrey Rolls was out on LOA and will not return.  Scheduled pt with Dr. Grayland Ormond on 03/03/14 at 8:30lab/9:00Dr. Grayland Ormond.  I also gave pt contact information for FC d/t pt no longer has insurance.  Pt denies further needs at this time.

## 2014-03-03 ENCOUNTER — Other Ambulatory Visit (HOSPITAL_BASED_OUTPATIENT_CLINIC_OR_DEPARTMENT_OTHER): Payer: No Typology Code available for payment source

## 2014-03-03 ENCOUNTER — Encounter: Payer: Self-pay | Admitting: Oncology

## 2014-03-03 ENCOUNTER — Ambulatory Visit (HOSPITAL_BASED_OUTPATIENT_CLINIC_OR_DEPARTMENT_OTHER): Payer: No Typology Code available for payment source | Admitting: Oncology

## 2014-03-03 VITALS — BP 123/84 | HR 86 | Temp 98.5°F | Resp 18 | Ht 63.0 in | Wt 284.0 lb

## 2014-03-03 DIAGNOSIS — Z1501 Genetic susceptibility to malignant neoplasm of breast: Secondary | ICD-10-CM

## 2014-03-03 DIAGNOSIS — Z803 Family history of malignant neoplasm of breast: Secondary | ICD-10-CM

## 2014-03-03 DIAGNOSIS — Z801 Family history of malignant neoplasm of trachea, bronchus and lung: Secondary | ICD-10-CM

## 2014-03-03 LAB — COMPREHENSIVE METABOLIC PANEL (CC13)
ALT: 33 U/L (ref 0–55)
AST: 26 U/L (ref 5–34)
Albumin: 3.7 g/dL (ref 3.5–5.0)
Alkaline Phosphatase: 63 U/L (ref 40–150)
Anion Gap: 9 mEq/L (ref 3–11)
BUN: 9.9 mg/dL (ref 7.0–26.0)
CO2: 27 mEq/L (ref 22–29)
Calcium: 9.6 mg/dL (ref 8.4–10.4)
Chloride: 106 mEq/L (ref 98–109)
Creatinine: 0.7 mg/dL (ref 0.6–1.1)
Glucose: 89 mg/dl (ref 70–140)
Potassium: 3.7 mEq/L (ref 3.5–5.1)
Sodium: 141 mEq/L (ref 136–145)
Total Bilirubin: 0.38 mg/dL (ref 0.20–1.20)
Total Protein: 6.9 g/dL (ref 6.4–8.3)

## 2014-03-03 LAB — CBC WITH DIFFERENTIAL/PLATELET
BASO%: 1.1 % (ref 0.0–2.0)
Basophils Absolute: 0.1 10*3/uL (ref 0.0–0.1)
EOS%: 1.4 % (ref 0.0–7.0)
Eosinophils Absolute: 0.1 10*3/uL (ref 0.0–0.5)
HCT: 40 % (ref 34.8–46.6)
HGB: 13.2 g/dL (ref 11.6–15.9)
LYMPH%: 39.2 % (ref 14.0–49.7)
MCH: 30.9 pg (ref 25.1–34.0)
MCHC: 32.9 g/dL (ref 31.5–36.0)
MCV: 94 fL (ref 79.5–101.0)
MONO#: 0.6 10*3/uL (ref 0.1–0.9)
MONO%: 9.2 % (ref 0.0–14.0)
NEUT#: 3.3 10*3/uL (ref 1.5–6.5)
NEUT%: 49.1 % (ref 38.4–76.8)
Platelets: 315 10*3/uL (ref 145–400)
RBC: 4.26 10*6/uL (ref 3.70–5.45)
RDW: 13.8 % (ref 11.2–14.5)
WBC: 6.8 10*3/uL (ref 3.9–10.3)
lymph#: 2.7 10*3/uL (ref 0.9–3.3)

## 2014-03-03 NOTE — Progress Notes (Signed)
I gave the patient an application for asst. No insurance and needs a mammogram. She has no specific diag as of yet--poss breast(family history). Her mom will take the app to [facility]. She was denied medicaid based on income for her and hubby and had insurance but became expensive. She is aware all is self until insurance. She is getting thru her employer and it will start 03/18/14.

## 2014-03-03 NOTE — Progress Notes (Signed)
[place]  [phone] [phone]     ID: Christine Mason OB: 10-Aug-1971  MR#: [id number]  WPY#:[id number]  Patient Care Team: Carlos Levering, PA-C as PCP - General (Family Medicine)  CHIEF COMPLAINT:  Chief Complaint  Patient presents with  . Follow-up    BREAST CANCER HISTORY:  High risk family history and patient with confirmed CHEK2 mutation.  INTERVAL HISTORY:  Patient returns to clinic today for further evaluation and discussion of rescheduling her breast MRI. She currently feels well and is asymptomatic. Her most recent mammogram on September 01, 2013 was reported as BI-RADS 1. She has no neurologic complaints. She denies any recent fevers or illnesses. Patient states she does regular breast exams and has noticed no new lumps or masses. She has no recent fevers. She has a good appetite and denies weight loss. She has no chest pain or shortness of breath. She denies any nausea, vomiting, constipation, or diarrhea. She has no urinary complaints. Patient feels at her baseline and offers no specific complaints today.  REVIEW OF SYSTEMS:  As per HPI. Otherwise, 10 point system review was negative.  PAST MEDICAL HISTORY: Past Medical History  Diagnosis Date  . Hypertension   . GERD (gastroesophageal reflux disease)   . Depression   . Chronic pain of left knee   . Chronic leg pain     right    PAST SURGICAL HISTORY: Past Surgical History  Procedure Laterality Date  . Acne cyst removal    . Mass excision    . Cholecystectomy    . Dilation and curettage of uterus  1998    FAMILY HISTORY Family History  Problem Relation Age of Onset  . Diabetes Mother   . Hypertension Mother   . Breast cancer Mother 7    dx again at 86; PALB2 and CHEK2 positive  . Diabetes Father   . Hypertension Father   . Coronary artery disease Father   . Hypertension Sister   . Thyroid cancer Maternal Uncle 46  . Lung cancer Maternal Grandmother   .  Leukemia Maternal Grandfather 39    dx with hairy cell leukemia at 75; CLL at 30  . Lymphoma Maternal Grandfather 82    hodgkins lymphoma  . Brain cancer Paternal Grandmother   . Prostate cancer Paternal Grandfather   . Lung cancer Maternal Aunt 54  . Lymphoma Maternal Uncle 60    follicular lymphoma    GYNECOLOGIC HISTORY:  No LMP recorded.      ADVANCED DIRECTIVES:    HEALTH MAINTENANCE: History  Substance Use Topics  . Smoking status: Current Every Day Smoker -- 0.50 packs/day for 5 years    Types: Cigarettes  . Smokeless tobacco: Never Used  . Alcohol Use: No     Colonoscopy:  PAP:  Bone density:  Lipid panel:  Allergies  Allergen Reactions  . Penicillins Rash    Current Outpatient Prescriptions  Medication Sig Dispense Refill  . aspirin EC 81 MG tablet Take 81 mg by mouth every morning.       Marland Kitchen b complex vitamins tablet Take 1 tablet by mouth every morning.       . cloNIDine (CATAPRES) 0.2 MG tablet Take 0.2 mg by mouth 2 (two) times daily.      Marland Kitchen gabapentin (NEURONTIN) 300 MG capsule Take 300 mg by mouth at bedtime.      Marland Kitchen HYDROcodone-acetaminophen (NORCO/VICODIN) 5-325 MG per tablet Take 1-2 tablets by mouth at bedtime as needed and may  repeat dose one time if needed for moderate pain.      Marland Kitchen ibuprofen (ADVIL,MOTRIN) 800 MG tablet Take 800 mg by mouth 3 (three) times daily.      Marland Kitchen LISINOPRIL-HYDROCHLOROTHIAZIDE PO Take 1 tablet by mouth daily.      . Multiple Vitamins-Minerals (ALIVE WOMENS ENERGY PO) Take 1 tablet by mouth daily.        . pantoprazole (PROTONIX) 40 MG tablet Take 40 mg by mouth daily.        . traMADol (ULTRAM) 50 MG tablet Take by mouth at bedtime as needed.      . venlafaxine XR (EFFEXOR-XR) 150 MG 24 hr capsule Take 150 mg by mouth every morning. Will change dose to 2105m this weekend      . nystatin-triamcinolone (MYCOLOG II) cream Apply to affected area two times daily  30 g  0   No current facility-administered medications for this  visit.    OBJECTIVE: Filed Vitals:   03/03/14 0856  BP: 123/84  Pulse: 86  Temp: 98.5 F (36.9 C)  Resp: 18     Body mass index is 50.32 kg/(m^2).    ECOG FS:0 - Asymptomatic   General: Well-developed, well-nourished, no acute distress. Eyes: Pink conjunctiva, anicteric sclera. Breasts: Patient requested exam be deferred today. Lungs: Clear to auscultation bilaterally. Heart: Regular rate and rhythm. No rubs, murmurs, or gallops. Abdomen: Soft, nontender, nondistended. No organomegaly noted, normoactive bowel sounds. Musculoskeletal: No edema, cyanosis, or clubbing. Neuro: Alert, answering all questions appropriately. Cranial nerves grossly intact. Skin: No rashes or petechiae noted. Psych: Normal affect.    LAB RESULTS:  CMP     Component Value Date/Time   NA 141 03/03/2014 0840   NA 139 03/05/2012 0946   K 3.7 03/03/2014 0840   K 3.8 03/05/2012 0946   CL 103 03/05/2012 0946   CO2 27 03/03/2014 0840   CO2 24 03/05/2012 0946   GLUCOSE 89 03/03/2014 0840   GLUCOSE 102* 03/05/2012 0946   BUN 9.9 03/03/2014 0840   BUN 8 03/05/2012 0946   CREATININE 0.7 03/03/2014 0840   CREATININE 0.65 03/05/2012 0946   CALCIUM 9.6 03/03/2014 0840   CALCIUM 9.8 03/05/2012 0946   PROT 6.9 03/03/2014 0840   PROT 7.8 03/05/2012 0946   ALBUMIN 3.7 03/03/2014 0840   ALBUMIN 4.1 03/05/2012 0946   AST 26 03/03/2014 0840   AST 21 03/05/2012 0946   ALT 33 03/03/2014 0840   ALT 18 03/05/2012 0946   ALKPHOS 63 03/03/2014 0840   ALKPHOS 68 03/05/2012 0946   BILITOT 0.38 03/03/2014 0840   BILITOT 0.5 03/05/2012 0946   GFRNONAA >90 03/05/2012 0946   GFRAA >90 03/05/2012 0946    I No results found for this basename: SPEP, UPEP,  kappa and lambda light chains    Lab Results  Component Value Date   WBC 6.8 03/03/2014   NEUTROABS 3.3 03/03/2014   HGB 13.2 03/03/2014   HCT 40.0 03/03/2014   MCV 94.0 03/03/2014   PLT 315 03/03/2014      Chemistry      Component Value Date/Time   NA 141 03/03/2014 0840   NA 139  03/05/2012 0946   K 3.7 03/03/2014 0840   K 3.8 03/05/2012 0946   CL 103 03/05/2012 0946   CO2 27 03/03/2014 0840   CO2 24 03/05/2012 0946   BUN 9.9 03/03/2014 0840   BUN 8 03/05/2012 0946   CREATININE 0.7 03/03/2014 0840   CREATININE 0.65 03/05/2012 0946  Component Value Date/Time   CALCIUM 9.6 03/03/2014 0840   CALCIUM 9.8 03/05/2012 0946   ALKPHOS 63 03/03/2014 0840   ALKPHOS 68 03/05/2012 0946   AST 26 03/03/2014 0840   AST 21 03/05/2012 0946   ALT 33 03/03/2014 0840   ALT 18 03/05/2012 0946   BILITOT 0.38 03/03/2014 0840   BILITOT 0.5 03/05/2012 0946       No results found for this basename: LABCA2    No components found with this basename: LABCA125    No results found for this basename: INR,  in the last 168 hours  Urinalysis    Component Value Date/Time   COLORURINE RED* 03/05/2012 1046   APPEARANCEUR HAZY* 03/05/2012 1046   LABSPEC 1.025 03/05/2012 1046   PHURINE 6.0 03/05/2012 1046   GLUCOSEU NEGATIVE 03/05/2012 1046   HGBUR LARGE* 03/05/2012 1046   BILIRUBINUR SMALL* 03/05/2012 1046   KETONESUR NEGATIVE 03/05/2012 1046   PROTEINUR 30* 03/05/2012 1046   UROBILINOGEN 0.2 03/05/2012 1046   NITRITE NEGATIVE 03/05/2012 1046   LEUKOCYTESUR TRACE* 03/05/2012 1046    STUDIES: No results found.  ASSESSMENT: 43 y.o. female with CHEK2 mutation which approximately doubles her chance of breast cancer.  PLAN:  1. High risk breast cancer: After lengthy discussion with patient, I feel that despite her normal mammogram in December of 2014 that a breast MRI in the next several weeks is a reasonable option. We again discussed at length the possibility of prophylactic tamoxifen, but it was determined that this is not necessary at this time. Return to clinic in 6 months for routine evaluation. She expressed understanding and was in agreement with this plan.   Lloyd Huger, MD   03/03/2014 10:44 AM

## 2014-03-04 ENCOUNTER — Telehealth: Payer: Self-pay

## 2014-03-04 ENCOUNTER — Other Ambulatory Visit: Payer: Self-pay

## 2014-03-04 NOTE — Telephone Encounter (Signed)
Faxed order to [facility] for MRI breast w wo contrast.  Requested GI schedule directly with patient for completion by 03/13/14.  Sent to scan.    Let pt know scan would be done at GI - that she should hear from them to schedule to be done prior to 6/26.  Pt voiced understanding.

## 2014-03-04 NOTE — Telephone Encounter (Signed)
Pt called back at 1320 wanting location of MRI at [facility].  POF sent to scheduling.    Called pt to know scheduling would be calling her - she said [facility] called her and when they found out what she wanted they scheduled her MRI at [facility] for her.  Let Webb Silversmith in scheduling know to cxl last pof.

## 2014-03-05 ENCOUNTER — Telehealth: Payer: Self-pay | Admitting: Oncology

## 2014-03-05 NOTE — Telephone Encounter (Signed)
, °

## 2014-03-09 ENCOUNTER — Telehealth: Payer: Self-pay | Admitting: *Deleted

## 2014-03-09 ENCOUNTER — Other Ambulatory Visit: Payer: Self-pay | Admitting: *Deleted

## 2014-03-09 ENCOUNTER — Ambulatory Visit (HOSPITAL_COMMUNITY)
Admission: RE | Admit: 2014-03-09 | Discharge: 2014-03-09 | Disposition: A | Discharge status: Home / Self Care | Payer: PRIVATE HEALTH INSURANCE | Source: Ambulatory Visit | Attending: Oncology | Admitting: Oncology

## 2014-03-09 DIAGNOSIS — Z1501 Genetic susceptibility to malignant neoplasm of breast: Secondary | ICD-10-CM

## 2014-03-09 NOTE — Telephone Encounter (Signed)
Call received requesting hard copy of today's MRI order or a signed order via EPIC.  Marzetta Board unable to view order entered on 03-03-2014.  New order signed and faxed to [phone].

## 2014-03-25 ENCOUNTER — Other Ambulatory Visit: Payer: Self-pay | Admitting: Oncology

## 2014-03-25 ENCOUNTER — Ambulatory Visit
Admission: RE | Admit: 2014-03-25 | Discharge: 2014-03-25 | Disposition: A | Discharge status: Home / Self Care | Payer: PRIVATE HEALTH INSURANCE | Source: Ambulatory Visit | Attending: Adult Health | Admitting: Adult Health

## 2014-03-25 ENCOUNTER — Ambulatory Visit: Admission: RE | Admit: 2014-03-25 | Payer: PRIVATE HEALTH INSURANCE | Source: Ambulatory Visit

## 2014-03-25 DIAGNOSIS — Z1501 Genetic susceptibility to malignant neoplasm of breast: Secondary | ICD-10-CM

## 2014-03-25 MED ORDER — GADOBENATE DIMEGLUMINE 529 MG/ML IV SOLN
20.0000 mL | Freq: Once | INTRAVENOUS | Status: AC | PRN
  Administered 2014-03-25: 20 mL via INTRAVENOUS

## 2014-03-27 ENCOUNTER — Telehealth: Payer: Self-pay | Admitting: *Deleted

## 2014-03-27 NOTE — Telephone Encounter (Signed)
Called pt to inform her of results concerning MRI. Communicated with pt that there was no evidence of malignancy in either breast. Pt was very pleased with results and verbalized understanding. Message to be forwarded to Charlestine Massed, NP.

## 2014-06-19 ENCOUNTER — Encounter (HOSPITAL_COMMUNITY): Payer: Self-pay | Admitting: Emergency Medicine

## 2014-06-19 ENCOUNTER — Emergency Department (HOSPITAL_COMMUNITY): Payer: PRIVATE HEALTH INSURANCE

## 2014-06-19 ENCOUNTER — Emergency Department (HOSPITAL_COMMUNITY)
Admission: EM | Admit: 2014-06-19 | Discharge: 2014-06-19 | Disposition: A | Discharge status: Home / Self Care | Payer: PRIVATE HEALTH INSURANCE | Attending: Emergency Medicine | Admitting: Emergency Medicine

## 2014-06-19 DIAGNOSIS — K219 Gastro-esophageal reflux disease without esophagitis: Secondary | ICD-10-CM | POA: Diagnosis not present

## 2014-06-19 DIAGNOSIS — I10 Essential (primary) hypertension: Secondary | ICD-10-CM | POA: Diagnosis not present

## 2014-06-19 DIAGNOSIS — Z880 Allergy status to penicillin: Secondary | ICD-10-CM | POA: Insufficient documentation

## 2014-06-19 DIAGNOSIS — F329 Major depressive disorder, single episode, unspecified: Secondary | ICD-10-CM | POA: Insufficient documentation

## 2014-06-19 DIAGNOSIS — M25511 Pain in right shoulder: Secondary | ICD-10-CM | POA: Insufficient documentation

## 2014-06-19 DIAGNOSIS — R29898 Other symptoms and signs involving the musculoskeletal system: Secondary | ICD-10-CM | POA: Diagnosis not present

## 2014-06-19 DIAGNOSIS — R000 Tachycardia, unspecified: Secondary | ICD-10-CM | POA: Insufficient documentation

## 2014-06-19 DIAGNOSIS — Z7982 Long term (current) use of aspirin: Secondary | ICD-10-CM | POA: Diagnosis not present

## 2014-06-19 DIAGNOSIS — R42 Dizziness and giddiness: Secondary | ICD-10-CM | POA: Diagnosis present

## 2014-06-19 DIAGNOSIS — G8929 Other chronic pain: Secondary | ICD-10-CM | POA: Diagnosis not present

## 2014-06-19 DIAGNOSIS — F419 Anxiety disorder, unspecified: Secondary | ICD-10-CM | POA: Diagnosis not present

## 2014-06-19 DIAGNOSIS — M898X1 Other specified disorders of bone, shoulder: Secondary | ICD-10-CM

## 2014-06-19 LAB — CBC WITH DIFFERENTIAL/PLATELET
Basophils Absolute: 0 10*3/uL (ref 0.0–0.1)
Basophils Relative: 0 % (ref 0–1)
Eosinophils Absolute: 0.1 10*3/uL (ref 0.0–0.7)
Eosinophils Relative: 1 % (ref 0–5)
HCT: 41.2 % (ref 36.0–46.0)
Hemoglobin: 13.9 g/dL (ref 12.0–15.0)
Lymphocytes Relative: 37 % (ref 12–46)
Lymphs Abs: 3.5 10*3/uL (ref 0.7–4.0)
MCH: 31.3 pg (ref 26.0–34.0)
MCHC: 33.7 g/dL (ref 30.0–36.0)
MCV: 92.8 fL (ref 78.0–100.0)
Monocytes Absolute: 0.7 10*3/uL (ref 0.1–1.0)
Monocytes Relative: 7 % (ref 3–12)
Neutro Abs: 5.2 10*3/uL (ref 1.7–7.7)
Neutrophils Relative %: 55 % (ref 43–77)
Platelets: 310 10*3/uL (ref 150–400)
RBC: 4.44 MIL/uL (ref 3.87–5.11)
RDW: 13.3 % (ref 11.5–15.5)
WBC: 9.5 10*3/uL (ref 4.0–10.5)

## 2014-06-19 LAB — COMPREHENSIVE METABOLIC PANEL
ALT: 31 U/L (ref 0–35)
AST: 23 U/L (ref 0–37)
Albumin: 4.5 g/dL (ref 3.5–5.2)
Alkaline Phosphatase: 80 U/L (ref 39–117)
Anion gap: 16 — ABNORMAL HIGH (ref 5–15)
BUN: 12 mg/dL (ref 6–23)
CO2: 26 mEq/L (ref 19–32)
Calcium: 9.9 mg/dL (ref 8.4–10.5)
Chloride: 96 mEq/L (ref 96–112)
Creatinine, Ser: 0.63 mg/dL (ref 0.50–1.10)
GFR calc Af Amer: >90 mL/min (ref >90)
GFR calc non Af Amer: >90 mL/min (ref >90)
Glucose, Bld: 100 mg/dL — ABNORMAL HIGH (ref 70–99)
Potassium: 3.5 mEq/L — ABNORMAL LOW (ref 3.7–5.3)
Sodium: 138 mEq/L (ref 137–147)
Total Bilirubin: 0.3 mg/dL (ref 0.3–1.2)
Total Protein: 8.3 g/dL (ref 6.0–8.3)

## 2014-06-19 LAB — TROPONIN I: Troponin I: <0.3 ng/mL (ref <0.30)

## 2014-06-19 LAB — URINALYSIS, ROUTINE W REFLEX MICROSCOPIC
Bilirubin Urine: NEGATIVE
Glucose, UA: NEGATIVE mg/dL
Hgb urine dipstick: NEGATIVE
Ketones, ur: NEGATIVE mg/dL
Leukocytes, UA: NEGATIVE
Nitrite: NEGATIVE
Protein, ur: NEGATIVE mg/dL
Specific Gravity, Urine: <1.005 — ABNORMAL LOW (ref 1.005–1.030)
Urobilinogen, UA: 0.2 mg/dL (ref 0.0–1.0)
pH: 6.5 (ref 5.0–8.0)

## 2014-06-19 LAB — MAGNESIUM: Magnesium: 1.9 mg/dL (ref 1.5–2.5)

## 2014-06-19 MED ORDER — ACETAMINOPHEN 500 MG PO TABS
1000.0000 mg | ORAL_TABLET | Freq: Once | ORAL | Status: AC
  Administered 2014-06-19: 1000 mg via ORAL
  Filled 2014-06-19: qty 2

## 2014-06-19 NOTE — ED Notes (Addendum)
Onset app 4 pm  With nausea, dizziness and  Weakness of rt arm.  Alert, talking.  Felt faint earlier , and cont to feel weak.    Took zofran pta ,  MAE,  Color nl.   Equal strong grips.
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
## 2014-06-19 NOTE — Discharge Instructions (Signed)
Have your doctor recheck you this week, she may want to do a doppler ultrasound of your carotid arteries or other tests. Use ice and heat to your pain in your shoulder blade area. Recheck if you get weakness or numbness in your arm or leg that is lasting more than 20-30 minutes.  Increase your aspirin to 325 mg a day until you can be rechecked by your doctor.

## 2014-07-20 ENCOUNTER — Encounter (HOSPITAL_COMMUNITY): Payer: Self-pay | Admitting: Emergency Medicine

## 2014-09-02 ENCOUNTER — Ambulatory Visit: Payer: BC Managed Care – PPO | Admitting: Oncology

## 2014-09-02 ENCOUNTER — Other Ambulatory Visit: Payer: Self-pay

## 2014-09-02 DIAGNOSIS — Z1501 Genetic susceptibility to malignant neoplasm of breast: Secondary | ICD-10-CM

## 2014-09-03 ENCOUNTER — Ambulatory Visit: Payer: BC Managed Care – PPO | Admitting: Oncology

## 2014-09-03 ENCOUNTER — Other Ambulatory Visit: Payer: BC Managed Care – PPO

## 2014-09-03 ENCOUNTER — Telehealth: Payer: Self-pay | Admitting: Hematology and Oncology

## 2014-09-03 ENCOUNTER — Ambulatory Visit (HOSPITAL_BASED_OUTPATIENT_CLINIC_OR_DEPARTMENT_OTHER): Payer: PRIVATE HEALTH INSURANCE | Admitting: Hematology and Oncology

## 2014-09-03 ENCOUNTER — Other Ambulatory Visit (HOSPITAL_BASED_OUTPATIENT_CLINIC_OR_DEPARTMENT_OTHER): Payer: PRIVATE HEALTH INSURANCE

## 2014-09-03 VITALS — BP 140/97 | HR 87 | Temp 98.2°F | Resp 18 | Ht 63.0 in | Wt 296.1 lb

## 2014-09-03 DIAGNOSIS — Z1501 Genetic susceptibility to malignant neoplasm of breast: Secondary | ICD-10-CM

## 2014-09-03 LAB — CBC WITH DIFFERENTIAL/PLATELET
BASO%: 0.3 % (ref 0.0–2.0)
Basophils Absolute: 0 10*3/uL (ref 0.0–0.1)
EOS%: 1.5 % (ref 0.0–7.0)
Eosinophils Absolute: 0.1 10*3/uL (ref 0.0–0.5)
HCT: 40.9 % (ref 34.8–46.6)
HGB: 13.5 g/dL (ref 11.6–15.9)
LYMPH%: 39.2 % (ref 14.0–49.7)
MCH: 30.7 pg (ref 25.1–34.0)
MCHC: 33 g/dL (ref 31.5–36.0)
MCV: 93 fL (ref 79.5–101.0)
MONO#: 0.4 10*3/uL (ref 0.1–0.9)
MONO%: 6.7 % (ref 0.0–14.0)
NEUT#: 3.2 10*3/uL (ref 1.5–6.5)
NEUT%: 52.3 % (ref 38.4–76.8)
Platelets: 311 10*3/uL (ref 145–400)
RBC: 4.4 10*6/uL (ref 3.70–5.45)
RDW: 13 % (ref 11.2–14.5)
WBC: 6.1 10*3/uL (ref 3.9–10.3)
lymph#: 2.4 10*3/uL (ref 0.9–3.3)

## 2014-09-03 LAB — COMPREHENSIVE METABOLIC PANEL (CC13)
ALT: 30 U/L (ref 0–55)
AST: 24 U/L (ref 5–34)
Albumin: 4 g/dL (ref 3.5–5.0)
Alkaline Phosphatase: 72 U/L (ref 40–150)
Anion Gap: 10 mEq/L (ref 3–11)
BUN: 11.4 mg/dL (ref 7.0–26.0)
CO2: 26 mEq/L (ref 22–29)
Calcium: 9.7 mg/dL (ref 8.4–10.4)
Chloride: 104 mEq/L (ref 98–109)
Creatinine: 0.8 mg/dL (ref 0.6–1.1)
EGFR: >90 mL/min/{1.73_m2} (ref >90)
Glucose: 104 mg/dl (ref 70–140)
Potassium: 3.9 mEq/L (ref 3.5–5.1)
Sodium: 141 mEq/L (ref 136–145)
Total Bilirubin: 0.48 mg/dL (ref 0.20–1.20)
Total Protein: 7.4 g/dL (ref 6.4–8.3)

## 2014-09-03 NOTE — Telephone Encounter (Signed)
, °

## 2014-09-03 NOTE — Assessment & Plan Note (Signed)
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 Based on NCCN guidelines, this is a pathogenic mutation that has been associated with risk of not only breast cancer but also colon, thyroid and kidney cancers.   Pathogenesis: I discussed with the patient that CHEK-2 encodes for a serine-threonine tyrosine kinase involved in the DNA repair in combination with ATM, BRCA1, P53 genes. Patients with mutation of the CHEK-2 gene results in the damaged DNAgetting replicated and increase the patient's risk for cancers.   Surveillance: I recommended annual mammograms and annual breast MRIs combined with monthly self breast examinations and breast examinations with her physician. I also discussed different other options including prophylactic bilateral mastectomies. Based on NCCN guidelines, there is no data to support the role of prophylactic bilateral mastectomy for CHEK-2 mutation. However given her familial history, if she chooses to undergo bilateral mastectomy it would not be unreasonable. Lifetime risk of breast cancer in vary from 28% to 38%. Higher with family history of breast cancer.   Other cancer surveillance: Apart of breast cancer, there are no definite surveillance approaches to kidney cancer. For colon cancer she already had a colonoscopy at age 43. she will need manual thyroid examinations for any lumps or nodules.

## 2014-09-03 NOTE — Progress Notes (Signed)
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 She is here for annual follow-up. 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 She gets a mammogram and MRI alternatingly every 6 months. She does not report any breast problems recently. She is a very heavy chested lady. Her sister is undergoing chemotherapy for breast cancer. Her mother has undergone treatment for breast cancer twice so far. Her mother had undergone bilateral mastectomies. She also has an extensive history of lung cancer leukemia/lymphoma and thyroid cancer in her family. It appears that her mother is both CHEK-2 and PALB2  POSITIVE.   REVIEW OF SYSTEMS:   Constitutional: Denies fevers, chills or abnormal weight loss Eyes: Denies blurriness of vision Ears, nose, mouth, throat, and face: Denies mucositis or sore throat Respiratory: Denies cough, dyspnea or wheezes Cardiovascular: Denies palpitation, chest discomfort or lower extremity swelling Gastrointestinal:  Denies nausea, heartburn or change in bowel habits Skin: Denies abnormal skin rashes Lymphatics: Denies new lymphadenopathy or easy bruising Neurological:Denies numbness, tingling or new weaknesses Behavioral/Psych: Mood is stable, no new changes  Breast:  denies any pain or lumps or nodules in either breasts All other systems were reviewed with the patient and are negative.  I have reviewed the past medical history, past surgical history, social history and family history with the patient and they are unchanged from previous note.  ALLERGIES:  is allergic to penicillins.  MEDICATIONS:  Current Outpatient Prescriptions  Medication Sig Dispense Refill  . aspirin EC 81 MG  tablet Take 81 mg by mouth every morning.     Marland Kitchen b complex vitamins tablet Take 1 tablet by mouth every morning.     . cloNIDine (CATAPRES) 0.2 MG tablet Take 0.2 mg by mouth 2 (two) times daily.    Marland Kitchen gabapentin (NEURONTIN) 300 MG capsule Take 300 mg by mouth at bedtime.    Marland Kitchen ibuprofen (ADVIL,MOTRIN) 800 MG tablet Take 800 mg by mouth at bedtime.     Marland Kitchen lisinopril-hydrochlorothiazide (PRINZIDE,ZESTORETIC) 20-12.5 MG per tablet Take 1 tablet by mouth daily.    . Multiple Vitamins-Minerals (ALIVE WOMENS ENERGY PO) Take 1 tablet by mouth daily.      . pantoprazole (PROTONIX) 40 MG tablet Take 40 mg by mouth daily.      . traMADol (ULTRAM) 50 MG tablet Take 50 mg by mouth at bedtime as needed.    . venlafaxine XR (EFFEXOR-XR) 75 MG 24 hr capsule Take 225 mg by mouth daily.    Marland Kitchen HYDROcodone-acetaminophen (NORCO/VICODIN) 5-325 MG per tablet Take 1-2 tablets by mouth at bedtime as needed and may repeat dose one time if needed for moderate pain.     No current facility-administered medications for this visit.    PHYSICAL EXAMINATION: ECOG PERFORMANCE STATUS: 0 - Asymptomatic  Filed Vitals:   09/03/14 1115  BP: 140/97  Pulse: 87  Temp: 98.2 F (36.8 C)  Resp: 18   Filed Weights   09/03/14 1115  Weight: 296 lb 1.6 oz (134.31 kg)    GENERAL:alert, no distress and comfortable SKIN: skin color, texture, turgor are normal, no rashes or significant lesions EYES: normal, Conjunctiva are pink and non-injected, sclera clear OROPHARYNX:no exudate, no erythema and lips, buccal mucosa, and tongue normal  NECK: supple, thyroid normal size, non-tender, without  nodularity LYMPH:  no palpable lymphadenopathy in the cervical, axillary or inguinal LUNGS: clear to auscultation and percussion with normal breathing effort HEART: regular rate & rhythm and no murmurs and no lower extremity edema ABDOMEN:abdomen soft, non-tender and normal bowel sounds Musculoskeletal:no cyanosis of digits and no clubbing   NEURO: alert & oriented x 3 with fluent speech, no focal motor/sensory deficits BREAST: No palpable masses or nodules in either right or left breasts. No palpable axillary supraclavicular or infraclavicular adenopathy no breast tenderness or nipple discharge.   LABORATORY DATA:  I have reviewed the data as listed   Chemistry      Component Value Date/Time   NA 141 09/03/2014 0941   NA 138 06/19/2014 1713   K 3.9 09/03/2014 0941   K 3.5* 06/19/2014 1713   CL 96 06/19/2014 1713   CO2 26 09/03/2014 0941   CO2 26 06/19/2014 1713   BUN 11.4 09/03/2014 0941   BUN 12 06/19/2014 1713   CREATININE 0.8 09/03/2014 0941   CREATININE 0.63 06/19/2014 1713      Component Value Date/Time   CALCIUM 9.7 09/03/2014 0941   CALCIUM 9.9 06/19/2014 1713   ALKPHOS 72 09/03/2014 0941   ALKPHOS 80 06/19/2014 1713   AST 24 09/03/2014 0941   AST 23 06/19/2014 1713   ALT 30 09/03/2014 0941   ALT 31 06/19/2014 1713   BILITOT 0.48 09/03/2014 0941   BILITOT 0.3 06/19/2014 1713       Lab Results  Component Value Date   WBC 6.1 09/03/2014   HGB 13.5 09/03/2014   HCT 40.9 09/03/2014   MCV 93.0 09/03/2014   PLT 311 09/03/2014   NEUTROABS 3.2 09/03/2014     RADIOGRAPHIC STUDIES: I have personally reviewed the radiology reports and agreed with their findings. 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 Based on NCCN guidelines, this is a pathogenic mutation that has been associated with risk of not only breast cancer but also colon, thyroid and kidney cancers.   Pathogenesis: I discussed with the patient that CHEK-2 encodes for a serine-threonine tyrosine kinase involved in the DNA repair in combination with ATM, BRCA1, P53 genes. Patients with mutation of the CHEK-2 gene results in the damaged DNAgetting replicated and  increase the patient's risk for cancers.   Surveillance: I recommended annual mammograms and annual breast MRIs combined with monthly self breast examinations and breast examinations with her physician. I also discussed different other options including prophylactic bilateral mastectomies. Based on NCCN guidelines, there is no data to support the role of prophylactic bilateral mastectomy for CHEK-2 mutation. However given her familial history, if she chooses to undergo bilateral mastectomy it would not be unreasonable. Lifetime risk of breast cancer in vary from 28% to 38%. Higher with family history of breast cancer.   Other cancer surveillance: Apart of breast cancer, there are no definite surveillance approaches to kidney cancer. For colon cancer she already had a colonoscopy at age 43. she will need manual thyroid examinations for any lumps or nodules.     Orders Placed This Encounter  Procedures  . MR Breast Bilateral W Contrast    Standing Status: Future     Number of Occurrences:      Standing Expiration Date: 09/03/2015    Order Specific Question:  Reason for Exam (SYMPTOM  OR DIAGNOSIS REQUIRED)      Answer:  CHEK2 mutation annual MRI    Order Specific Question:  Preferred imaging location?    Answer:  External     Comments:  Breast center    Order Specific Question:  Does the patient have a pacemaker or implanted devices?    Answer:  No    Order Specific Question:  What is the patient's sedation requirement?    Answer:  No Sedation   The patient has a good understanding of the overall plan. she agrees with it. She will call with any problems that may develop before her next visit here.   Rulon Eisenmenger, MD 09/03/2014 12:46 PM

## 2014-09-18 HISTORY — PX: THORACOTOMY: SHX5074

## 2014-09-29 ENCOUNTER — Encounter (HOSPITAL_COMMUNITY): Payer: Self-pay | Admitting: Emergency Medicine

## 2014-09-29 ENCOUNTER — Emergency Department (HOSPITAL_COMMUNITY): Payer: PRIVATE HEALTH INSURANCE

## 2014-09-29 ENCOUNTER — Emergency Department (HOSPITAL_COMMUNITY)
Admission: EM | Admit: 2014-09-29 | Discharge: 2014-09-29 | Disposition: A | Discharge status: Home / Self Care | Payer: PRIVATE HEALTH INSURANCE | Attending: Emergency Medicine | Admitting: Emergency Medicine

## 2014-09-29 DIAGNOSIS — Z79899 Other long term (current) drug therapy: Secondary | ICD-10-CM | POA: Insufficient documentation

## 2014-09-29 DIAGNOSIS — F329 Major depressive disorder, single episode, unspecified: Secondary | ICD-10-CM | POA: Diagnosis not present

## 2014-09-29 DIAGNOSIS — G8929 Other chronic pain: Secondary | ICD-10-CM | POA: Insufficient documentation

## 2014-09-29 DIAGNOSIS — Z720 Tobacco use: Secondary | ICD-10-CM | POA: Insufficient documentation

## 2014-09-29 DIAGNOSIS — K219 Gastro-esophageal reflux disease without esophagitis: Secondary | ICD-10-CM | POA: Insufficient documentation

## 2014-09-29 DIAGNOSIS — Z7982 Long term (current) use of aspirin: Secondary | ICD-10-CM | POA: Diagnosis not present

## 2014-09-29 DIAGNOSIS — R109 Unspecified abdominal pain: Secondary | ICD-10-CM | POA: Diagnosis present

## 2014-09-29 DIAGNOSIS — K5732 Diverticulitis of large intestine without perforation or abscess without bleeding: Secondary | ICD-10-CM | POA: Insufficient documentation

## 2014-09-29 DIAGNOSIS — Z880 Allergy status to penicillin: Secondary | ICD-10-CM | POA: Insufficient documentation

## 2014-09-29 DIAGNOSIS — I10 Essential (primary) hypertension: Secondary | ICD-10-CM | POA: Insufficient documentation

## 2014-09-29 HISTORY — DX: Diverticulitis of intestine, part unspecified, without perforation or abscess without bleeding: K57.92

## 2014-09-29 LAB — BASIC METABOLIC PANEL
Anion gap: 8 (ref 5–15)
BUN: 14 mg/dL (ref 6–23)
CO2: 28 mmol/L (ref 19–32)
Calcium: 9.5 mg/dL (ref 8.4–10.5)
Chloride: 104 mEq/L (ref 96–112)
Creatinine, Ser: 0.64 mg/dL (ref 0.50–1.10)
GFR calc Af Amer: >90 mL/min (ref >90)
GFR calc non Af Amer: >90 mL/min (ref >90)
Glucose, Bld: 101 mg/dL — ABNORMAL HIGH (ref 70–99)
Potassium: 3.6 mmol/L (ref 3.5–5.1)
Sodium: 140 mmol/L (ref 135–145)

## 2014-09-29 LAB — CBC WITH DIFFERENTIAL/PLATELET
Basophils Absolute: 0 10*3/uL (ref 0.0–0.1)
Basophils Relative: 0 % (ref 0–1)
Eosinophils Absolute: 0.1 10*3/uL (ref 0.0–0.7)
Eosinophils Relative: 1 % (ref 0–5)
HCT: 42.1 % (ref 36.0–46.0)
Hemoglobin: 13.7 g/dL (ref 12.0–15.0)
Lymphocytes Relative: 17 % (ref 12–46)
Lymphs Abs: 2 10*3/uL (ref 0.7–4.0)
MCH: 30.6 pg (ref 26.0–34.0)
MCHC: 32.5 g/dL (ref 30.0–36.0)
MCV: 94.2 fL (ref 78.0–100.0)
Monocytes Absolute: 1 10*3/uL (ref 0.1–1.0)
Monocytes Relative: 8 % (ref 3–12)
Neutro Abs: 8.5 10*3/uL — ABNORMAL HIGH (ref 1.7–7.7)
Neutrophils Relative %: 74 % (ref 43–77)
Platelets: 299 10*3/uL (ref 150–400)
RBC: 4.47 MIL/uL (ref 3.87–5.11)
RDW: 13.2 % (ref 11.5–15.5)
WBC: 11.6 10*3/uL — ABNORMAL HIGH (ref 4.0–10.5)

## 2014-09-29 LAB — PREGNANCY, URINE: Preg Test, Ur: NEGATIVE

## 2014-09-29 LAB — URINALYSIS, ROUTINE W REFLEX MICROSCOPIC
Bilirubin Urine: NEGATIVE
Glucose, UA: NEGATIVE mg/dL
Hgb urine dipstick: NEGATIVE
Ketones, ur: NEGATIVE mg/dL
Leukocytes, UA: NEGATIVE
Nitrite: NEGATIVE
Protein, ur: NEGATIVE mg/dL
Specific Gravity, Urine: 1.02 (ref 1.005–1.030)
Urobilinogen, UA: 0.2 mg/dL (ref 0.0–1.0)
pH: 6 (ref 5.0–8.0)

## 2014-09-29 MED ORDER — CIPROFLOXACIN IN D5W 400 MG/200ML IV SOLN
400.0000 mg | Freq: Once | INTRAVENOUS | Status: AC
  Administered 2014-09-29: 400 mg via INTRAVENOUS
  Filled 2014-09-29: qty 200

## 2014-09-29 MED ORDER — ONDANSETRON HCL 4 MG PO TABS: 4.0000 mg | ORAL_TABLET | Freq: Three times a day (TID) | ORAL | Status: DC | PRN

## 2014-09-29 MED ORDER — HYDROMORPHONE HCL 1 MG/ML IJ SOLN
1.0000 mg | Freq: Once | INTRAMUSCULAR | Status: AC
  Administered 2014-09-29: 1 mg via INTRAVENOUS
  Filled 2014-09-29: qty 1

## 2014-09-29 MED ORDER — ONDANSETRON HCL 4 MG/2ML IJ SOLN
4.0000 mg | Freq: Once | INTRAMUSCULAR | Status: AC
  Administered 2014-09-29: 4 mg via INTRAVENOUS
  Filled 2014-09-29: qty 2

## 2014-09-29 MED ORDER — SODIUM CHLORIDE 0.9 % IV BOLUS (SEPSIS)
1000.0000 mL | Freq: Once | INTRAVENOUS | Status: AC
  Administered 2014-09-29: 1000 mL via INTRAVENOUS

## 2014-09-29 MED ORDER — CIPROFLOXACIN HCL 500 MG PO TABS: 500.0000 mg | ORAL_TABLET | Freq: Two times a day (BID) | ORAL | Status: DC

## 2014-09-29 MED ORDER — OXYCODONE-ACETAMINOPHEN 5-325 MG PO TABS
2.0000 | ORAL_TABLET | Freq: Once | ORAL | Status: AC
Start: 2014-09-29 — End: 2014-09-29
  Administered 2014-09-29: 2 via ORAL
  Filled 2014-09-29: qty 2

## 2014-09-29 MED ORDER — FENTANYL CITRATE 0.05 MG/ML IJ SOLN
50.0000 ug | Freq: Once | INTRAMUSCULAR | Status: AC
  Administered 2014-09-29: 50 ug via INTRAVENOUS
  Filled 2014-09-29: qty 2

## 2014-09-29 MED ORDER — OXYCODONE-ACETAMINOPHEN 5-325 MG PO TABS: ORAL_TABLET | ORAL | Status: DC

## 2014-09-29 MED ORDER — METRONIDAZOLE IN NACL 5-0.79 MG/ML-% IV SOLN
500.0000 mg | Freq: Once | INTRAVENOUS | Status: AC
  Administered 2014-09-29: 500 mg via INTRAVENOUS
  Filled 2014-09-29: qty 100

## 2014-09-29 MED ORDER — METRONIDAZOLE 500 MG PO TABS
500.0000 mg | ORAL_TABLET | Freq: Three times a day (TID) | ORAL | Status: DC
Start: 2014-09-29 — End: 2015-04-22

## 2014-09-29 NOTE — ED Notes (Signed)
Patient reports left lower abdominal pain that started yesterday. Reports nausea and feels lightheaded as well. Denies diarrhea, but reports constipation. States last bowel movement 09/27/14.

## 2014-09-29 NOTE — Discharge Instructions (Signed)
°Emergency Department Resource Guide °1) Find a Doctor and Pay Out of Pocket °Although you won't have to find out who is covered by your insurance plan, it is a good idea to ask around and get recommendations. You will then need to call the office and see if the doctor you have chosen will accept you as a new patient and what types of options they offer for patients who are self-pay. Some doctors offer discounts or will set up payment plans for their patients who do not have insurance, but you will need to ask so you aren't surprised when you get to your appointment. ° °2) Contact Your Local Health Department °Not all health departments have doctors that can see patients for sick visits, but many do, so it is worth a call to see if yours does. If you don't know where your local health department is, you can check in your phone book. The CDC also has a tool to help you locate your state's health department, and many state websites also have listings of all of their local health departments. ° °3) Find a Walk-in Clinic °If your illness is not likely to be very severe or complicated, you may want to try a walk in clinic. These are popping up all over the country in pharmacies, drugstores, and shopping centers. They're usually staffed by nurse practitioners or physician assistants that have been trained to treat common illnesses and complaints. They're usually fairly quick and inexpensive. However, if you have serious medical issues or chronic medical problems, these are probably not your best option. ° °No Primary Care Doctor: °- Call [facility] at  832-8000 - they can help you locate a primary care doctor that  accepts your insurance, provides certain services, etc. °- Physician Referral Service- 1-800-533-3463 ° °Chronic Pain Problems: °Organization         Address  Phone   Notes  °[facility]  (336) 297-2271 Patients need to be referred by their primary care doctor.  ° °Medication  Assistance: °Organization         Address  Phone   Notes  °[place] [facility] [address] °[place], [place] [address] (336) 641-8030 --Must be a resident of [place] °-- Must have NO insurance coverage whatsoever (no Medicaid/ Medicare, etc.) °-- The pt. MUST have a primary care doctor that directs their care regularly and follows them in the community °  °[facility]  (866) 331-1348   °[employer] Way  (888) 892-1162   ° °Agencies that provide inexpensive medical care: °Organization         Address  Phone   Notes  °[facility]  (336) 832-8035   °[facility]    (336) 832-7272   °[facility] [address] °[place], [place] [address] (336) 832-4777   °[facility] [address], °[place] (336) 271-4999   °[facility]    (336) 373-0678   °[facility]    (336) 272-1050   °[facility] ° [address], [place] Phone:  (336) 832-4444, Fax:  (336) 832-4440 Hours of Operation:  9 am - 6 pm, M-F.  Also accepts Medicaid/Medicare and self-pay.  °[facility] ° [address], [place] Phone: (336) 832-3150, Fax: (336) [phone]. Hours of Operation:  8:30 am - 5:30 pm, M-F.  Also accepts Medicaid and self-pay.  °[facility] [place] [address], [place] Phone: (336) 878-6027   °[facility] [address], [place], [place] (336)723-1848, Ext. 123 Mondays & Thursdays: 7-9 AM.  First 15 patients are seen on a first come, first serve basis. °  ° °Medicaid-accepting [place] Providers: ° °Organization         Address  Phone   Notes  °[facility] [address], [place] (336) 641-2100 Also accepts self-pay patients.  °[facility] [address], [place] ° (336) 856-9996   °[facility] [address], [place]  (336) 288-8857   °[facility] [address] [place] [address], [place] (336) 299-7000   °Veita Bland [address], [place]  ° (336) 373-1557 Only accepts [place] Access Medicaid patients after they have their name applied to their card.  ° °Self-Pay (no insurance) in [place]: ° °Organization         Address  Phone   Notes  °Sickle Cell Patients, [facility] [address], [place] (336) 832-1970   °[facility] [address], [place] (336) 832-4400   °[facility] [place] ° [address], [place] (336) 992-4800   °[facility]/Dr. Osei-Bonsu ° [address] [place] [address], [place] or [address], [place] (336) 841-8500 Phone number for both [place] and [place] locations is the same.  °[facility] [address], [place] (336) 299-0000   °[facility] [address] [place] [address], [place] or [address] (336) 852-7530 °(336) 878-2260   °[facility] [address], [place] (336) 350-1642, phone; (336) 294-5005, fax Sees patients 1st and 3rd Saturday of every month.  Must not qualify for public or private insurance (i.e. Medicaid, Medicare, [place] Health Choice, Veterans' Benefits) • Household income should be no more than 200% of the poverty level •The clinic cannot treat you if you are pregnant or think you are pregnant • Sexually transmitted diseases are not treated at the clinic.  ° ° °Dental Care: °Organization         Address  Phone  Notes  °[place] [facility] [address], [place] (336) 641-6152 Accepts children up to age 21 who are enrolled in Medicaid or [place] Health Choice; pregnant women with a Medicaid card; and children who have applied for Medicaid or [place] Health Choice, but were declined, whose parents can pay a reduced fee at time of service.  °[place]  [facility] [place]  [address], [place] (336) 641-7733 Accepts children up to age 21 who are enrolled in Medicaid or [place] Health Choice; pregnant women with a Medicaid card; and children who have applied for Medicaid or [place] Health Choice, but were declined, whose parents can pay a reduced fee at time of service.  °[facility] ° [address], [place] (336) 641-4533 Patients are seen by appointment only. Walk-ins are not accepted. [facility] will see patients 18 years of age and older. °Monday - Tuesday (8am-5pm) °Most Wednesdays (8:30-5pm) °$30 per visit, cash only  °[facility] ° [address], [place] (336) 641-4533 Patients are seen by appointment only. Walk-ins are not accepted. [facility] will see patients 18 years of age and older. °One   Wednesday Evening (Monthly: Volunteer Based).  $30 per visit, cash only  °[facility]  (919) 537-3737 for adults; Children under age 4, call Graduate Pediatric Dentistry at ([phone]) [phone]. Children aged 4-14, please call ([phone]) [phone] to request a pediatric application. ° Dental services are provided in all areas of dental care including fillings, crowns and bridges, complete and partial dentures, implants, gum treatment, root canals, and extractions. Preventive care is also provided. Treatment is provided to both adults and children. °Patients are selected via a lottery and there is often a waiting list. °  °[facility] [address], °[place] ° (336) 763-8833 [email or web address] °  °[facility] [address], [place], [place] (336)723-1848, Ext. 123 Second and Fourth Thursday of each month, opens at 6:30 AM; Clinic ends at 9 AM.  Patients are seen on a first-come first-served basis, and a limited number are seen during each clinic.  ° °[facility] ° [address], [place], [place] (336) 723-7904    Eligibility Requirements °You must have lived in [place], [place], or [place] counties for at least the last three months. °  You cannot be eligible for state or federal sponsored healthcare insurance, including Veterans Administration, Medicaid, or Medicare. °  You generally cannot be eligible for healthcare insurance through your employer.  °  How to apply: °Eligibility screenings are held every Tuesday and Wednesday afternoon from 1:00 pm until 4:00 pm. You do not need an appointment for the interview!  °[facility] [address], [place], [place] 336-631-2330   °[facility] [facility]  336-342-8273   °[place] [facility]  336-703-3100   °[facility]  336-570-6415   ° °Behavioral Health Resources in the Community: °Intensive Outpatient Programs °Organization         Address  Phone  Notes  °[facility] [address], [place], [place] 336-878-6098   °[facility] [address], [place], [place] 336-832-9800   °[facility] [address], [place], [place] ° 336-882-2125   °[facility] [address],  °[place], [place] 1-800-853-5163 or 336-641-4981   °Substance Abuse Resources °Organization         Address  Phone  Notes  °[facility]  336-882-2125   °[facility]  336-784-9470   °[facility]  336-285-9073   °[facility]  336-845-3988   °Residential & Outpatient Substance Abuse Program  1-800-659-3381   °Psychological Services °Organization         Address  Phone  Notes  °[facility]  336- 832-9600   °[facility]  336- 378-7881   °[facility] [address], [place] 1-800-853-5163 or 336-641-4981   ° °Mobile Crisis Teams °Organization         Address  Phone  Notes  °[facility], Mobile Crisis Care Unit  1-877-626-1772   °Assertive °[facility] ° [address]  [place], [place] 336-834-9664   °Sharon DeEsch [address] °[place] 336-554-5454   ° °Self-Help/Support Groups °Organization         Address  Phone             Notes  °[facility] - variety of support groups  336- 373-1402 Call for more information  °Narcotics Anonymous (NA), [facility] [address], °[place]   2 meetings at this location  ° °  Residential Treatment Programs °Organization         Address  Phone  Notes  °[facility] [address],    °[place]  1-866-801-8205   °[facility] ° [address], [place], [place] 704-293-8524   °[facility] [address], [place] 336-845-3988 Admissions: 8am-3pm M-F  °[facility] [address]    °[place], [place] 336-841-1104   °[facility] [address], [place], [place] 336-379-7146   °[facility] [address]  °[place], [place] 336-285-9073   °[facility] - Intensive Outpatient [address], [place], [place] 336-852-3033   °[facility] [address]  °[place], [place] 1-877-615-2722 or 336-784-9470   °[facility] [address] [place], [place] 336-227-7417 Accepts Medicaid  °[facility] [address]  °[place] 1-800-659-3381 Substance Abuse/Addiction Treatment  ° °[place] Behavioral Health Resources °Organization         Address  Phone  Notes  °[facility]  (888) 581-9988   °Julie Brannon, PhD [address] [place], [place]   (336) 349-5553 or (336) 951-0000   °[facility]   [address] °[place], [place] (336) 349-4454   °[facility] [address], [place], [place] (336) 342-8316 Insurance/Medicaid/sponsorship through [facility]  °[facility] [address]                                    [place], [place] (336) 342-8316 Therapy/tele-psych/case    °[facility] [address]  ° [place], [place] (336) 349-2233    °Dr. Arfeen  (336) 349-4544   °[facility] [place]  [employer] [place] [facility] 1) [address], [place] °2) [address], [place] °3)  [address], [place] (336) 349-3220 °(336) 342-7768 ° °(336) 342-8140   °[place] Child Abuse [facility] (336) 342-1394 or (336) 342-3537 (After Hours)    ° ° °Take the prescriptions as directed.  Call your regular medical doctor today to schedule a follow up appointment within the next 2 days.  Return to the Emergency Department immediately sooner if worsening.  ° °

## 2014-09-29 NOTE — ED Provider Notes (Signed)
Pt received at change of shift with CT A/P pending. Pt with hx left sided abd "pain" since yesterday. Endorses hx of diverticulitis and "this feels the same." Denies fevers, no N/V/D, no black or blood in stools. A&O, NAD/non-toxic appearing, resps easy, mild TTP left sided abd.  CT with mild diverticulitis; 1st dose abx given. Pt has tol PO well without N/V while in the ED. No stooling. Offered admission, but pt states she wants to go home. Dx and testing d/w pt.  Questions answered.  Verb understanding, agreeable to d/c home with outpt f/u.    Results for orders placed or performed during the hospital encounter of 09/29/14  Urinalysis, Routine w reflex microscopic  Result Value Ref Range   Color, Urine YELLOW YELLOW   APPearance CLEAR CLEAR   Specific Gravity, Urine 1.020 1.005 - 1.030   pH 6.0 5.0 - 8.0   Glucose, UA NEGATIVE NEGATIVE mg/dL   Hgb urine dipstick NEGATIVE NEGATIVE   Bilirubin Urine NEGATIVE NEGATIVE   Ketones, ur NEGATIVE NEGATIVE mg/dL   Protein, ur NEGATIVE NEGATIVE mg/dL   Urobilinogen, UA 0.2 0.0 - 1.0 mg/dL   Nitrite NEGATIVE NEGATIVE   Leukocytes, UA NEGATIVE NEGATIVE  CBC with Differential  Result Value Ref Range   WBC 11.6 (H) 4.0 - 10.5 K/uL   RBC 4.47 3.87 - 5.11 MIL/uL   Hemoglobin 13.7 12.0 - 15.0 g/dL   HCT 42.1 36.0 - 46.0 %   MCV 94.2 78.0 - 100.0 fL   MCH 30.6 26.0 - 34.0 pg   MCHC 32.5 30.0 - 36.0 g/dL   RDW 13.2 11.5 - 15.5 %   Platelets 299 150 - 400 K/uL   Neutrophils Relative % 74 43 - 77 %   Neutro Abs 8.5 (H) 1.7 - 7.7 K/uL   Lymphocytes Relative 17 12 - 46 %   Lymphs Abs 2.0 0.7 - 4.0 K/uL   Monocytes Relative 8 3 - 12 %   Monocytes Absolute 1.0 0.1 - 1.0 K/uL   Eosinophils Relative 1 0 - 5 %   Eosinophils Absolute 0.1 0.0 - 0.7 K/uL   Basophils Relative 0 0 - 1 %   Basophils Absolute 0.0 0.0 - 0.1 K/uL  Basic metabolic panel  Result Value Ref Range   Sodium 140 135 - 145 mmol/L   Potassium 3.6 3.5 - 5.1 mmol/L   Chloride 104 96 -  112 mEq/L   CO2 28 19 - 32 mmol/L   Glucose, Bld 101 (H) 70 - 99 mg/dL   BUN 14 6 - 23 mg/dL   Creatinine, Ser 0.64 0.50 - 1.10 mg/dL   Calcium 9.5 8.4 - 10.5 mg/dL   GFR calc non Af Amer >90 >90 mL/min   GFR calc Af Amer >90 >90 mL/min   Anion gap 8 5 - 15  Pregnancy, urine  Result Value Ref Range   Preg Test, Ur NEGATIVE NEGATIVE   Ct Renal Stone Study 09/29/2014   CLINICAL DATA:  Left-sided abdominal pain  EXAM: CT ABDOMEN AND PELVIS WITHOUT CONTRAST  TECHNIQUE: Multidetector CT imaging of the abdomen and pelvis was performed following the standard protocol without IV contrast.  COMPARISON:  03/21/2011  FINDINGS: The lung bases are clear.  No renal, ureteral, or bladder calculi. No obstructive uropathy. No perinephric stranding is seen. The kidneys are symmetric in size without evidence for exophytic mass. The bladder is unremarkable.  The liver demonstrates no focal abnormality. The gallbladder is surgically absent. The spleen demonstrates no focal abnormality. The  adrenal glands and pancreas are normal.  The unopacified stomach, duodenum and small intestine are unremarkable. There is focal bowel wall thickening with a diverticulum at the descending colon-sigmoid colon junction most consistent with acute diverticulitis. There is surrounding inflammatory changes. There is no focal fluid collection to suggest an abscess. There is a small umbilical fat containing hernia. There is a normal caliber appendix in the right lower quadrant without periappendiceal inflammatory changes. There is no pneumoperitoneum, pneumatosis, or portal venous gas. There is no abdominal or pelvic free fluid. There is no lymphadenopathy.  The abdominal aorta is normal in caliber.  There are mild degenerative changes of bilateral SI joints.  IMPRESSION: 1. Mild acute diverticulitis of the descending colon-stable colon junction. No focal fluid collection to suggest an abscess.   Electronically Signed   By: Kathreen Devoid   On:  09/29/2014 08:08       Francine Graven, DO 10/01/14 1344

## 2014-09-29 NOTE — ED Provider Notes (Addendum)
CSN: [id number]     Arrival date & time 09/29/14  0607 History   First MD Initiated Contact with Patient 09/29/14 [phone]     Chief Complaint  Patient presents with  . Abdominal Pain      HPI Patient reports left-sided abdominal pain which began yesterday.  She has nausea without vomiting.  She denies diarrhea.  She reports that her pain radiates up into her left side and left flank.  She has a history of kidney stones as well as diverticulosis/diverticulitis.  She reports low-grade fevers.  She denies urinary complaints.  Denies blood in her stool.  No right-sided abdominal pain.  Denies chest pain or shortness of breath.  Symptoms are moderate in severity.  Nothing worsens or improves her symptoms   Past Medical History  Diagnosis Date  . Hypertension   . GERD (gastroesophageal reflux disease)   . Depression   . Chronic pain of left knee   . Chronic leg pain     right  . Diverticulitis    Past Surgical History  Procedure Laterality Date  . Acne cyst removal    . Mass excision    . Cholecystectomy    . Dilation and curettage of uterus  1998  . Knee surgery     Family History  Problem Relation Age of Onset  . Diabetes Mother   . Hypertension Mother   . Breast cancer Mother 5    dx again at 104; PALB2 and CHEK2 positive  . Diabetes Father   . Hypertension Father   . Coronary artery disease Father   . Hypertension Sister   . Thyroid cancer Maternal Uncle 46  . Lung cancer Maternal Grandmother   . Leukemia Maternal Grandfather 102    dx with hairy cell leukemia at 58; CLL at 49  . Lymphoma Maternal Grandfather 82    hodgkins lymphoma  . Brain cancer Paternal Grandmother   . Prostate cancer Paternal Grandfather   . Lung cancer Maternal Aunt 84  . Lymphoma Maternal Uncle 60    follicular lymphoma   History  Substance Use Topics  . Smoking status: Current Every Day Smoker -- 0.50 packs/day for 5 years    Types: Cigarettes  . Smokeless tobacco: Never Used  . Alcohol  Use: No   OB History    Gravida Para Term Preterm AB TAB SAB Ectopic Multiple Living   1    1  1    0     Review of Systems  All other systems reviewed and are negative.     Allergies  Penicillins  Home Medications   Prior to Admission medications   Medication Sig Start Date End Date Taking? Authorizing Provider  aspirin EC 81 MG tablet Take 81 mg by mouth every morning.     Historical Provider, MD  b complex vitamins tablet Take 1 tablet by mouth every morning.     Historical Provider, MD  cloNIDine (CATAPRES) 0.2 MG tablet Take 0.2 mg by mouth 2 (two) times daily.    Historical Provider, MD  gabapentin (NEURONTIN) 300 MG capsule Take 300 mg by mouth at bedtime.    Historical Provider, MD  HYDROcodone-acetaminophen (NORCO/VICODIN) 5-325 MG per tablet Take 1-2 tablets by mouth at bedtime as needed and may repeat dose one time if needed for moderate pain.    Historical Provider, MD  ibuprofen (ADVIL,MOTRIN) 800 MG tablet Take 800 mg by mouth at bedtime.     Historical Provider, MD  lisinopril-hydrochlorothiazide (PRINZIDE,ZESTORETIC) 20-12.5 MG per  tablet Take 1 tablet by mouth daily.    Historical Provider, MD  Multiple Vitamins-Minerals (ALIVE WOMENS ENERGY PO) Take 1 tablet by mouth daily.      Historical Provider, MD  pantoprazole (PROTONIX) 40 MG tablet Take 40 mg by mouth daily.      Historical Provider, MD  traMADol (ULTRAM) 50 MG tablet Take 50 mg by mouth at bedtime as needed.    Historical Provider, MD  venlafaxine XR (EFFEXOR-XR) 75 MG 24 hr capsule Take 225 mg by mouth daily.    Historical Provider, MD   BP 137/92 mmHg  Pulse 105  Temp(Src) 98.4 F (36.9 C) (Oral)  Resp 20  Ht 5' 3"  (1.6 m)  Wt 290 lb (131.543 kg)  BMI 51.38 kg/m2  SpO2 100%  LMP 08/20/2014 Physical Exam  Constitutional: She is oriented to person, place, and time. She appears well-developed and well-nourished. No distress.  HENT:  Head: Normocephalic and atraumatic.  Eyes: EOM are normal.   Neck: Normal range of motion.  Cardiovascular: Normal rate, regular rhythm and normal heart sounds.   Pulmonary/Chest: Effort normal and breath sounds normal.  Abdominal: Soft. She exhibits no distension.  Mild left-sided abdominal tenderness without guarding or rebound  Musculoskeletal: Normal range of motion.  Neurological: She is alert and oriented to person, place, and time.  Skin: Skin is warm and dry.  Psychiatric: She has a normal mood and affect. Judgment normal.  Nursing note and vitals reviewed.   ED Course  Procedures (including critical care time) Labs Review Labs Reviewed  CBC WITH DIFFERENTIAL - Abnormal; Notable for the following:    WBC 11.6 (*)    Neutro Abs 8.5 (*)    All other components within normal limits  BASIC METABOLIC PANEL - Abnormal; Notable for the following:    Glucose, Bld 101 (*)    All other components within normal limits  URINALYSIS, ROUTINE W REFLEX MICROSCOPIC  PREGNANCY, URINE    Imaging Review No results found.   EKG Interpretation None      MDM   Final diagnoses:  Left sided abdominal pain    Could represent left-sided ureteral colic versus diverticulitis.  CT scan will be performed.  IV fluids, pain medicine now.  Care to Dr Thurnell Garbe to follow up on CT scan    Hoy Morn, MD 09/29/14 [place], MD 09/29/14 [phone]

## 2014-12-21 ENCOUNTER — Other Ambulatory Visit (HOSPITAL_COMMUNITY): Payer: Self-pay | Admitting: Family Medicine

## 2014-12-21 DIAGNOSIS — Z1231 Encounter for screening mammogram for malignant neoplasm of breast: Secondary | ICD-10-CM

## 2015-01-06 ENCOUNTER — Ambulatory Visit (HOSPITAL_COMMUNITY)
Admission: RE | Admit: 2015-01-06 | Discharge: 2015-01-06 | Disposition: A | Discharge status: Home / Self Care | Payer: PRIVATE HEALTH INSURANCE | Source: Ambulatory Visit | Attending: Family Medicine | Admitting: Family Medicine

## 2015-01-06 DIAGNOSIS — Z1231 Encounter for screening mammogram for malignant neoplasm of breast: Secondary | ICD-10-CM | POA: Insufficient documentation

## 2015-01-08 ENCOUNTER — Other Ambulatory Visit: Payer: Self-pay | Admitting: Adult Health

## 2015-03-19 ENCOUNTER — Telehealth: Payer: Self-pay | Admitting: Hematology and Oncology

## 2015-03-19 NOTE — Telephone Encounter (Signed)
Called and left a message with her new appointment- reschedule from 8/5 to 8/4,calendar mailed

## 2015-04-21 NOTE — Assessment & Plan Note (Signed)
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 Based on NCCN guidelines, this is a pathogenic mutation that has been associated with risk of not only breast cancer but also colon, thyroid and kidney cancers.  Lifetime risk of breast cancer in vary from 28% to 38%. Higher with family history of breast cancer.   Breast Cancer Surveillance: 1. Breast exam 04/22/15: Normal 2. Mammogram 01/11/15 No abnormalities. Postsurgical changes. Breast Density Category B. Discussed the differences between different breast density categories.  RTC in 1 year

## 2015-04-22 ENCOUNTER — Ambulatory Visit (HOSPITAL_BASED_OUTPATIENT_CLINIC_OR_DEPARTMENT_OTHER): Payer: PRIVATE HEALTH INSURANCE | Admitting: Hematology and Oncology

## 2015-04-22 ENCOUNTER — Telehealth: Payer: Self-pay | Admitting: Hematology and Oncology

## 2015-04-22 ENCOUNTER — Encounter: Payer: Self-pay | Admitting: Hematology and Oncology

## 2015-04-22 VITALS — BP 136/74 | HR 89 | Temp 98.5°F | Resp 18 | Ht 63.0 in | Wt 290.8 lb

## 2015-04-22 DIAGNOSIS — Z1501 Genetic susceptibility to malignant neoplasm of breast: Secondary | ICD-10-CM | POA: Diagnosis not present

## 2015-04-22 NOTE — Progress Notes (Signed)
Patient Care Team: Carlos Levering, PA-C as PCP - General (Family Medicine)  DIAGNOSIS: CHEK-2 mutation   CHIEF COMPLIANT: follow-up and surveillance for breast cancer  INTERVAL HISTORY: Christine Mason is a 44 year old with above-mentioned history of CHEK-2 mutation. She is here for surveillance. She had a mammogram in April which was normal. She denies any lungs the nodules in the breasts. She is suffering from a lot of hot flashes related to being perimenopausal.  REVIEW OF SYSTEMS:   Constitutional: Denies fevers, chills or abnormal weight loss Eyes: Denies blurriness of vision Ears, nose, mouth, throat, and face: Denies mucositis or sore throat Respiratory: Denies cough, dyspnea or wheezes Cardiovascular: Denies palpitation, chest discomfort or lower extremity swelling Gastrointestinal:  Denies nausea, heartburn or change in bowel habits Skin: Denies abnormal skin rashes Lymphatics: Denies new lymphadenopathy or easy bruising Neurological:Denies numbness, tingling or new weaknesses Behavioral/Psych: Mood is stable, no new changes  Breast:  denies any pain or lumps or nodules in either breasts All other systems were reviewed with the patient and are negative.  I have reviewed the past medical history, past surgical history, social history and family history with the patient and they are unchanged from previous note.  ALLERGIES:  is allergic to penicillins.  MEDICATIONS:  Current Outpatient Prescriptions  Medication Sig Dispense Refill  . aspirin EC 81 MG tablet Take 81 mg by mouth every morning.     Marland Kitchen b complex vitamins tablet Take 1 tablet by mouth every morning.     . ciprofloxacin (CIPRO) 500 MG tablet Take 1 tablet (500 mg total) by mouth 2 (two) times daily. 14 tablet 0  . cloNIDine (CATAPRES) 0.2 MG tablet Take 0.2 mg by mouth 2 (two) times daily.    Marland Kitchen gabapentin (NEURONTIN) 300 MG capsule Take 300 mg by mouth at bedtime.    Marland Kitchen HYDROcodone-acetaminophen (NORCO/VICODIN)  5-325 MG per tablet Take 1-2 tablets by mouth at bedtime as needed and may repeat dose one time if needed for moderate pain.    Marland Kitchen ibuprofen (ADVIL,MOTRIN) 800 MG tablet Take 800 mg by mouth every 8 (eight) hours as needed (pain).     Marland Kitchen lisinopril-hydrochlorothiazide (PRINZIDE,ZESTORETIC) 20-12.5 MG per tablet Take 1 tablet by mouth daily.    . metroNIDAZOLE (FLAGYL) 500 MG tablet Take 1 tablet (500 mg total) by mouth 3 (three) times daily. 21 tablet 0  . Multiple Vitamins-Minerals (MULTIVITAMIN WITH MINERALS) tablet Take 1 tablet by mouth daily.    . ondansetron (ZOFRAN) 4 MG tablet Take 1 tablet (4 mg total) by mouth every 8 (eight) hours as needed for nausea or vomiting. 6 tablet 0  . oxyCODONE-acetaminophen (PERCOCET/ROXICET) 5-325 MG per tablet 1 or 2 tabs PO q6h prn pain 25 tablet 0  . pantoprazole (PROTONIX) 40 MG tablet Take 40 mg by mouth daily.      . traMADol (ULTRAM) 50 MG tablet Take 50 mg by mouth at bedtime as needed (pain).     Marland Kitchen venlafaxine XR (EFFEXOR-XR) 75 MG 24 hr capsule Take 225 mg by mouth daily.     No current facility-administered medications for this visit.    PHYSICAL EXAMINATION: ECOG PERFORMANCE STATUS: 1 - Symptomatic but completely ambulatory  Filed Vitals:   04/22/15 0841  BP: 136/74  Pulse: 89  Temp: 98.5 F (36.9 C)  Resp: 18   Filed Weights   04/22/15 0841  Weight: 290 lb 12.8 oz (131.906 kg)    GENERAL:alert, no distress and comfortable SKIN: skin color, texture, turgor are normal, no  rashes or significant lesions EYES: normal, Conjunctiva are pink and non-injected, sclera clear OROPHARYNX:no exudate, no erythema and lips, buccal mucosa, and tongue normal  NECK: supple, thyroid normal size, non-tender, without nodularity LYMPH:  no palpable lymphadenopathy in the cervical, axillary or inguinal LUNGS: clear to auscultation and percussion with normal breathing effort HEART: regular rate & rhythm and no murmurs and no lower extremity  edema ABDOMEN:abdomen soft, non-tender and normal bowel sounds Musculoskeletal:no cyanosis of digits and no clubbing  NEURO: alert & oriented x 3 with fluent speech, no focal motor/sensory deficits BREAST: No palpable masses or nodules in either right or left breasts. No palpable axillary supraclavicular or infraclavicular adenopathy no breast tenderness or nipple discharge. (exam performed in the presence of a chaperone)  LABORATORY DATA:  I have reviewed the data as listed   Chemistry      Component Value Date/Time   NA 140 09/29/2014 0645   NA 141 09/03/2014 0941   K 3.6 09/29/2014 0645   K 3.9 09/03/2014 0941   CL 104 09/29/2014 0645   CO2 28 09/29/2014 0645   CO2 26 09/03/2014 0941   BUN 14 09/29/2014 0645   BUN 11.4 09/03/2014 0941   CREATININE 0.64 09/29/2014 0645   CREATININE 0.8 09/03/2014 0941      Component Value Date/Time   CALCIUM 9.5 09/29/2014 0645   CALCIUM 9.7 09/03/2014 0941   ALKPHOS 72 09/03/2014 0941   ALKPHOS 80 06/19/2014 1713   AST 24 09/03/2014 0941   AST 23 06/19/2014 1713   ALT 30 09/03/2014 0941   ALT 31 06/19/2014 1713   BILITOT 0.48 09/03/2014 0941   BILITOT 0.3 06/19/2014 1713       Lab Results  Component Value Date   WBC 11.6* 09/29/2014   HGB 13.7 09/29/2014   HCT 42.1 09/29/2014   MCV 94.2 09/29/2014   PLT 299 09/29/2014   NEUTROABS 8.5* 09/29/2014     RADIOGRAPHIC STUDIES: I have personally reviewed the radiology reports and agreed with their findings. 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 Based on NCCN guidelines, this is a pathogenic mutation that has been associated with risk of not only breast cancer but also colon, thyroid and kidney cancers.  Lifetime risk of breast cancer in vary from 28% to 38%. Higher with family history of breast cancer.   Breast Cancer  Surveillance: 1. Breast exam 04/22/15: Normal 2. Mammogram 01/11/15 No abnormalities. Postsurgical changes. Breast Density Category B. Discussed the differences between different breast density categories.  Patient would like a referral to general surgery for bilateral mastectomies. She prefers to see Dr. Excell Seltzer who had operated on her sister. We sent a referral for that. I recommended that she get a breast MRI. I sent orders for that.  RTC in 1 year    No orders of the defined types were placed in this encounter.   The patient has a good understanding of the overall plan. she agrees with it. she will call with any problems that may develop before the next visit here.   Rulon Eisenmenger, MD

## 2015-04-22 NOTE — Telephone Encounter (Signed)
Gave avs & calendar for August 2017. Faxed MRI to the breast center

## 2015-04-23 ENCOUNTER — Ambulatory Visit: Payer: PRIVATE HEALTH INSURANCE | Admitting: Hematology and Oncology

## 2015-04-27 ENCOUNTER — Other Ambulatory Visit: Payer: Self-pay | Admitting: Hematology and Oncology

## 2015-04-27 DIAGNOSIS — Z1501 Genetic susceptibility to malignant neoplasm of breast: Secondary | ICD-10-CM

## 2015-04-30 ENCOUNTER — Telehealth: Payer: Self-pay

## 2015-04-30 NOTE — Telephone Encounter (Signed)
MRI order faxed to [facility].  Sent to scan.

## 2015-06-01 ENCOUNTER — Telehealth: Payer: Self-pay | Admitting: *Deleted

## 2015-06-01 ENCOUNTER — Inpatient Hospital Stay: Admission: RE | Admit: 2015-06-01 | Payer: PRIVATE HEALTH INSURANCE | Source: Ambulatory Visit

## 2015-06-01 NOTE — Telephone Encounter (Signed)
"  Calling about MRI scheduled by the nurse for Dr. Lindi Adie.  [facility] is out of Network and I need her to schedule this somewhere that is in Network." This nurse called patient instructing her to call insurance and let us know what is in network for her.

## 2015-06-01 NOTE — Telephone Encounter (Signed)
Looked up covering providers.  Sent pof. Let pt know to expect a call to schedule MRI and mammo.

## 2015-06-02 ENCOUNTER — Other Ambulatory Visit: Payer: Self-pay

## 2015-06-02 ENCOUNTER — Telehealth: Payer: Self-pay

## 2015-06-02 ENCOUNTER — Telehealth: Payer: Self-pay | Admitting: Hematology and Oncology

## 2015-06-02 NOTE — Telephone Encounter (Signed)
Per 9/13 pof schedule mri at [facility] and mammo at [facility] ins will only pay for test to be done at these facilities. Spoke with patient and per patient she only needs a breast mri not mammo. Per patient mammo done April 2016 at [facility]. Desk nurse made aware and also that breast mri's are not done at [facility]. Per desk nurse she checked patient's ins site and [facility] comes up. Spoke with Tia Masker to check on contracted facilities and pre auth. Per Vaughan Basta she spoke with Pamelia Hoit at [facility] today and patient has no diagnostic radiology procedure coverage at all. Desk nurse informed and per desk nurse she will inform patient. No breast mri scheduled. Not other orders per 9/13 pof.

## 2015-06-02 NOTE — Telephone Encounter (Signed)
Let pt know we checked her insurance and they do not provide any diagnostic radiology coverage.  They will not pay for this test.  Pt voiced understanding.

## 2015-06-03 ENCOUNTER — Encounter: Payer: Self-pay | Admitting: Genetic Counselor

## 2015-06-03 DIAGNOSIS — Z1379 Encounter for other screening for genetic and chromosomal anomalies: Secondary | ICD-10-CM | POA: Insufficient documentation

## 2015-06-27 ENCOUNTER — Emergency Department (HOSPITAL_COMMUNITY)
Admission: EM | Admit: 2015-06-27 | Discharge: 2015-06-27 | Disposition: A | Discharge status: Home / Self Care | Payer: PRIVATE HEALTH INSURANCE | Attending: Emergency Medicine | Admitting: Emergency Medicine

## 2015-06-27 ENCOUNTER — Encounter (HOSPITAL_COMMUNITY): Payer: Self-pay | Admitting: Emergency Medicine

## 2015-06-27 ENCOUNTER — Emergency Department (HOSPITAL_COMMUNITY): Payer: PRIVATE HEALTH INSURANCE

## 2015-06-27 DIAGNOSIS — I10 Essential (primary) hypertension: Secondary | ICD-10-CM | POA: Insufficient documentation

## 2015-06-27 DIAGNOSIS — R52 Pain, unspecified: Secondary | ICD-10-CM

## 2015-06-27 DIAGNOSIS — Z880 Allergy status to penicillin: Secondary | ICD-10-CM | POA: Diagnosis not present

## 2015-06-27 DIAGNOSIS — Z720 Tobacco use: Secondary | ICD-10-CM | POA: Diagnosis not present

## 2015-06-27 DIAGNOSIS — K219 Gastro-esophageal reflux disease without esophagitis: Secondary | ICD-10-CM | POA: Insufficient documentation

## 2015-06-27 DIAGNOSIS — G8929 Other chronic pain: Secondary | ICD-10-CM | POA: Diagnosis not present

## 2015-06-27 DIAGNOSIS — Z9049 Acquired absence of other specified parts of digestive tract: Secondary | ICD-10-CM | POA: Insufficient documentation

## 2015-06-27 DIAGNOSIS — F329 Major depressive disorder, single episode, unspecified: Secondary | ICD-10-CM | POA: Insufficient documentation

## 2015-06-27 DIAGNOSIS — R1033 Periumbilical pain: Secondary | ICD-10-CM | POA: Diagnosis present

## 2015-06-27 DIAGNOSIS — Z7982 Long term (current) use of aspirin: Secondary | ICD-10-CM | POA: Diagnosis not present

## 2015-06-27 DIAGNOSIS — K429 Umbilical hernia without obstruction or gangrene: Secondary | ICD-10-CM | POA: Insufficient documentation

## 2015-06-27 DIAGNOSIS — Z79899 Other long term (current) drug therapy: Secondary | ICD-10-CM | POA: Insufficient documentation

## 2015-06-27 LAB — COMPREHENSIVE METABOLIC PANEL
ALT: 31 U/L (ref 14–54)
AST: 26 U/L (ref 15–41)
Albumin: 4.4 g/dL (ref 3.5–5.0)
Alkaline Phosphatase: 58 U/L (ref 38–126)
Anion gap: 7 (ref 5–15)
BUN: 14 mg/dL (ref 6–20)
CO2: 27 mmol/L (ref 22–32)
Calcium: 9.9 mg/dL (ref 8.9–10.3)
Chloride: 103 mmol/L (ref 101–111)
Creatinine, Ser: 0.67 mg/dL (ref 0.44–1.00)
GFR calc Af Amer: >60 mL/min (ref >60)
GFR calc non Af Amer: >60 mL/min (ref >60)
Glucose, Bld: 95 mg/dL (ref 65–99)
Potassium: 3.8 mmol/L (ref 3.5–5.1)
Sodium: 137 mmol/L (ref 135–145)
Total Bilirubin: 0.4 mg/dL (ref 0.3–1.2)
Total Protein: 7.5 g/dL (ref 6.5–8.1)

## 2015-06-27 LAB — CBC WITH DIFFERENTIAL/PLATELET
Basophils Absolute: 0 10*3/uL (ref 0.0–0.1)
Basophils Relative: 0 %
Eosinophils Absolute: 0.1 10*3/uL (ref 0.0–0.7)
Eosinophils Relative: 1 %
HCT: 39.3 % (ref 36.0–46.0)
Hemoglobin: 13.2 g/dL (ref 12.0–15.0)
Lymphocytes Relative: 38 %
Lymphs Abs: 3.7 10*3/uL (ref 0.7–4.0)
MCH: 31.4 pg (ref 26.0–34.0)
MCHC: 33.6 g/dL (ref 30.0–36.0)
MCV: 93.6 fL (ref 78.0–100.0)
Monocytes Absolute: 0.7 10*3/uL (ref 0.1–1.0)
Monocytes Relative: 7 %
Neutro Abs: 5.2 10*3/uL (ref 1.7–7.7)
Neutrophils Relative %: 54 %
Platelets: 322 10*3/uL (ref 150–400)
RBC: 4.2 MIL/uL (ref 3.87–5.11)
RDW: 13.6 % (ref 11.5–15.5)
WBC: 9.7 10*3/uL (ref 4.0–10.5)

## 2015-06-27 LAB — URINALYSIS, ROUTINE W REFLEX MICROSCOPIC
Bilirubin Urine: NEGATIVE
Glucose, UA: NEGATIVE mg/dL
Hgb urine dipstick: NEGATIVE
Ketones, ur: NEGATIVE mg/dL
Leukocytes, UA: NEGATIVE
Nitrite: NEGATIVE
Protein, ur: NEGATIVE mg/dL
Specific Gravity, Urine: >1.03 — ABNORMAL HIGH (ref 1.005–1.030)
Urobilinogen, UA: 0.2 mg/dL (ref 0.0–1.0)
pH: 6 (ref 5.0–8.0)

## 2015-06-27 LAB — AMYLASE: Amylase: 57 U/L (ref 28–100)

## 2015-06-27 LAB — LIPASE, BLOOD: Lipase: 36 U/L (ref 22–51)

## 2015-06-27 MED ORDER — HYDROMORPHONE HCL 1 MG/ML IJ SOLN
1.0000 mg | Freq: Once | INTRAMUSCULAR | Status: AC
Start: 2015-06-27 — End: 2015-06-27
  Administered 2015-06-27: 1 mg via INTRAVENOUS
  Filled 2015-06-27: qty 1

## 2015-06-27 MED ORDER — HYDROCODONE-ACETAMINOPHEN 5-325 MG PO TABS: 2.0000 | ORAL_TABLET | ORAL | Status: DC | PRN

## 2015-06-27 MED ORDER — GI COCKTAIL ~~LOC~~
30.0000 mL | Freq: Once | ORAL | Status: AC
  Administered 2015-06-27: 30 mL via ORAL
  Filled 2015-06-27: qty 30

## 2015-06-27 MED ORDER — ONDANSETRON HCL 4 MG/2ML IJ SOLN
4.0000 mg | Freq: Once | INTRAMUSCULAR | Status: AC
Start: 2015-06-27 — End: 2015-06-27
  Administered 2015-06-27: 4 mg via INTRAVENOUS
  Filled 2015-06-27: qty 2

## 2015-06-27 MED ORDER — MORPHINE SULFATE (PF) 4 MG/ML IV SOLN
4.0000 mg | INTRAVENOUS | Status: DC | PRN
  Administered 2015-06-27: 4 mg via INTRAVENOUS
  Filled 2015-06-27: qty 1

## 2015-06-27 NOTE — ED Provider Notes (Signed)
CSN: [id number]     Arrival date & time 06/27/15  1815 History   First MD Initiated Contact with Patient 06/27/15 1903     Chief Complaint  Patient presents with  . Abdominal Pain     HPI  Patient presents for evaluation of sudden sharp periumbilical abdominal pain. States this started on Friday. She stood up and felt sudden pain around her umbilicus. Discontinue sharp pain worse with any movement. Mild nausea today but no vomiting. Has had normal bowel movements. No dysuria or frequency. No hematuria. No food intolerance. No fevers chills. No skin abnormalities to the abdomen. No past similar episode.  Had a prior episode of diverticulitis. States pain from diverticulitis was worse and more down into the left. Pain has not changed in its location since onset 48 hours ago.  Past Medical History  Diagnosis Date  . Hypertension   . GERD (gastroesophageal reflux disease)   . Depression   . Chronic pain of left knee   . Chronic leg pain     right  . Diverticulitis    Past Surgical History  Procedure Laterality Date  . Acne cyst removal    . Mass excision    . Cholecystectomy    . Dilation and curettage of uterus  1998  . Knee surgery     Family History  Problem Relation Age of Onset  . Diabetes Mother   . Hypertension Mother   . Breast cancer Mother 72    dx again at 57; PALB2 and CHEK2 positive  . Diabetes Father   . Hypertension Father   . Coronary artery disease Father   . Hypertension Sister   . Thyroid cancer Maternal Uncle 46  . Lung cancer Maternal Grandmother   . Leukemia Maternal Grandfather 52    dx with hairy cell leukemia at 17; CLL at 77  . Lymphoma Maternal Grandfather 82    hodgkins lymphoma  . Brain cancer Paternal Grandmother   . Prostate cancer Paternal Grandfather   . Lung cancer Maternal Aunt 37  . Lymphoma Maternal Uncle 60    follicular lymphoma   Social History  Substance Use Topics  . Smoking status: Current Every Day Smoker -- 0.50  packs/day for 5 years    Types: Cigarettes  . Smokeless tobacco: Never Used  . Alcohol Use: No   OB History    Gravida Para Term Preterm AB TAB SAB Ectopic Multiple Living   1    1  1    0     Review of Systems  Constitutional: Negative for fever, chills, diaphoresis, appetite change and fatigue.  HENT: Negative for mouth sores, sore throat and trouble swallowing.   Eyes: Negative for visual disturbance.  Respiratory: Negative for cough, chest tightness, shortness of breath and wheezing.   Cardiovascular: Negative for chest pain.  Gastrointestinal: Positive for nausea and abdominal pain. Negative for vomiting, diarrhea and abdominal distention.  Endocrine: Negative for polydipsia, polyphagia and polyuria.  Genitourinary: Negative for dysuria, frequency and hematuria.  Musculoskeletal: Negative for gait problem.  Skin: Negative for color change, pallor and rash.  Neurological: Negative for dizziness, syncope, light-headedness and headaches.  Hematological: Does not bruise/bleed easily.  Psychiatric/Behavioral: Negative for behavioral problems and confusion.      Allergies  Penicillins  Home Medications   Prior to Admission medications   Medication Sig Start Date End Date Taking? Authorizing Provider  aspirin EC 81 MG tablet Take 81 mg by mouth every morning.    Yes Historical Provider,  MD  b complex vitamins tablet Take 1 tablet by mouth every morning.    Yes Historical Provider, MD  cloNIDine (CATAPRES) 0.2 MG tablet Take 0.2 mg by mouth 2 (two) times daily.   Yes Historical Provider, MD  gabapentin (NEURONTIN) 300 MG capsule Take 300 mg by mouth 4 (four) times daily.    Yes Historical Provider, MD  lisinopril-hydrochlorothiazide (PRINZIDE,ZESTORETIC) 20-12.5 MG per tablet Take 1 tablet by mouth daily.   Yes Historical Provider, MD  Multiple Vitamins-Minerals (MULTIVITAMIN WITH MINERALS) tablet Take 1 tablet by mouth daily.   Yes Historical Provider, MD  ondansetron (ZOFRAN) 4  MG tablet Take 1 tablet (4 mg total) by mouth every 8 (eight) hours as needed for nausea or vomiting. 09/29/14  Yes Francine Graven, DO  pantoprazole (PROTONIX) 40 MG tablet Take 40 mg by mouth daily.     Yes Historical Provider, MD  traZODone (DESYREL) 100 MG tablet Take 100 mg by mouth at bedtime.   Yes Historical Provider, MD  venlafaxine XR (EFFEXOR-XR) 75 MG 24 hr capsule Take 225 mg by mouth daily.   Yes Historical Provider, MD  HYDROcodone-acetaminophen (NORCO/VICODIN) 5-325 MG tablet Take 2 tablets by mouth every 4 (four) hours as needed. 06/27/15   Tanna Furry, MD   BP 169/58 mmHg  Pulse 99  Temp(Src) 98.2 F (36.8 C) (Oral)  Resp 18  Ht 5' 3"  (1.6 m)  Wt 290 lb (131.543 kg)  BMI 51.38 kg/m2  SpO2 97%  LMP 05/22/2015 Physical Exam  Constitutional: She is oriented to person, place, and time. She appears well-developed and well-nourished. No distress.  Morbid obesity 44 year old female. Will again a cluster periumbilical abdomen.  HENT:  Head: Normocephalic.  Eyes: Conjunctivae are normal. Pupils are equal, round, and reactive to light. No scleral icterus.  Neck: Normal range of motion. Neck supple. No thyromegaly present.  Cardiovascular: Normal rate and regular rhythm.  Exam reveals no gallop and no friction rub.   No murmur heard. Pulmonary/Chest: Effort normal and breath sounds normal. No respiratory distress. She has no wheezes. She has no rales.  Abdominal: Soft. Bowel sounds are normal. She exhibits no distension. There is no tenderness. There is no rebound.    Musculoskeletal: Normal range of motion.  Neurological: She is alert and oriented to person, place, and time.  Skin: Skin is warm and dry. No rash noted.  Psychiatric: She has a normal mood and affect. Her behavior is normal.    ED Course  Procedures (including critical care time) Labs Review Labs Reviewed  URINALYSIS, [place] (NOT AT [facility]) - Abnormal; Notable for the following:     Specific Gravity, Urine >1.030 (*)    All other components within normal limits  AMYLASE  CBC WITH DIFFERENTIAL/PLATELET  COMPREHENSIVE METABOLIC PANEL  LIPASE, BLOOD    Imaging Review Ct Renal Stone Study  06/27/2015   CLINICAL DATA:  Sharp abdominal pain and occasional nausea for the last 3 days.  EXAM: CT ABDOMEN AND PELVIS WITHOUT CONTRAST  TECHNIQUE: Multidetector CT imaging of the abdomen and pelvis was performed following the standard protocol without IV contrast.  COMPARISON:  CT of the abdomen and pelvis dated 09/29/2014.  FINDINGS: Lower chest:  No acute findings.  Hepatobiliary: No mass visualized on this un-enhanced exam. There has been a prior cholecystectomy.  Pancreas: No mass or inflammatory process identified on this un-enhanced exam.  Spleen: Within normal limits in size.  Adrenals/Urinary Tract: No evidence of urolithiasis or hydronephrosis. There is a 2.1 cm  hypoattenuated circumscribed lesion within the inferior pole of the right kidney. Similarly there is a 1.4 cm hypoattenuated circumscribed lesion within the inferior pole of the left kidney. These are stable from the previous CT in January 2016, and likely represent cysts.  Stomach/Bowel: No evidence of obstruction, inflammatory process, or abnormal fluid collections. Scattered left colonic diverticula without evidence of diverticulitis.  Vascular/Lymphatic: No pathologically enlarged lymph nodes. No evidence of abdominal aortic aneurysm.  Reproductive: Leiomyomatous uterus.  No evidence of adnexal masses.  Other: Infraumbilical fat containing anterior abdominal wall hernia.  Musculoskeletal:  No suspicious bone lesions identified.  IMPRESSION: Bilateral renal cysts.  Left colonic diverticulosis without evidence of diverticulitis.  Leiomyomatous uterus.  Infraumbilical fat containing anterior abdominal wall hernia.   Electronically Signed   By: Fidela Salisbury M.D.   On: 06/27/2015 20:18   I have personally reviewed and  evaluated these images and lab results as part of my medical decision-making.   EKG Interpretation None      MDM   Final diagnoses:  Periumbilical hernia    Symptoms improving. No bowel incarceration. Plan is home, symptomatic treatment, outpatient surgical referral if continued symptoms. ER with worsening, vomiting, other changes.    Tanna Furry, MD 06/27/15 2051

## 2015-06-27 NOTE — ED Notes (Signed)
Pt states that on Friday she started having very sharp abdominal pains.  States occasional nausea.

## 2015-06-27 NOTE — Discharge Instructions (Signed)
Hernia, Adult A hernia is the bulging of an organ or tissue through a weak spot in the muscles of the abdomen (abdominal wall). Hernias develop most often near the navel or groin. There are many kinds of hernias. Common kinds include:  Femoral hernia. This kind of hernia develops under the groin in the upper thigh area.  Inguinal hernia. This kind of hernia develops in the groin or scrotum.  Umbilical hernia. This kind of hernia develops near the navel.  Hiatal hernia. This kind of hernia causes part of the stomach to be pushed up into the chest.  Incisional hernia. This kind of hernia bulges through a scar from an abdominal surgery. CAUSES This condition may be caused by:  Heavy lifting.  Coughing over a long period of time.  Straining to have a bowel movement.  An incision made during an abdominal surgery.  A birth defect (congenital defect).  Excess weight or obesity.  Smoking.  Poor nutrition.  Cystic fibrosis.  Excess fluid in the abdomen.  Undescended testicles. SYMPTOMS Symptoms of a hernia include:  A lump on the abdomen. This is the first sign of a hernia. The lump may become more obvious with standing, straining, or coughing. It may get bigger over time if it is not treated or if the condition causing it is not treated.  Pain. A hernia is usually painless, but it may become painful over time if treatment is delayed. The pain is usually dull and may get worse with standing or lifting heavy objects. Sometimes a hernia gets tightly squeezed in the weak spot (strangulated) or stuck there (incarcerated) and causes additional symptoms. These symptoms may include:  Vomiting.  Nausea.  Constipation.  Irritability. DIAGNOSIS A hernia may be diagnosed with:  A physical exam. During the exam your health care provider may ask you to cough or to make a specific movement, because a hernia is usually more visible when you move.  Imaging tests. These can  include:  X-rays.  Ultrasound.  CT scan. TREATMENT A hernia that is small and painless may not need to be treated. A hernia that is large or painful may be treated with surgery. Inguinal hernias may be treated with surgery to prevent incarceration or strangulation. Strangulated hernias are always treated with surgery, because lack of blood to the trapped organ or tissue can cause it to die. Surgery to treat a hernia involves pushing the bulge back into place and repairing the weak part of the abdomen. HOME CARE INSTRUCTIONS  Avoid straining.  Do not lift anything heavier than 10 lb (4.5 kg).  Lift with your leg muscles, not your back muscles. This helps avoid strain.  When coughing, try to cough gently.  Prevent constipation. Constipation leads to straining with bowel movements, which can make a hernia worse or cause a hernia repair to break down. You can prevent constipation by:  Eating a high-fiber diet that includes plenty of fruits and vegetables.  Drinking enough fluids to keep your urine clear or pale yellow. Aim to drink 6-8 glasses of water per day.  Using a stool softener as directed by your health care provider.  Lose weight, if you are overweight.  Do not use any tobacco products, including cigarettes, chewing tobacco, or electronic cigarettes. If you need help quitting, ask your health care provider.  Keep all follow-up visits as directed by your health care provider. This is important. Your health care provider may need to monitor your condition. SEEK MEDICAL CARE IF:  You have   swelling, redness, and pain in the affected area.  Your bowel habits change. SEEK IMMEDIATE MEDICAL CARE IF:  You have a fever.  You have abdominal pain that is getting worse.  You feel nauseous or you vomit.  You cannot push the hernia back in place by gently pressing on it while you are lying down.  The hernia:  Changes in shape or size.  Is stuck outside the  abdomen.  Becomes discolored.  Feels hard or tender.   This information is not intended to replace advice given to you by your health care provider. Make sure you discuss any questions you have with your health care provider.   Document Released: 09/04/2005 Document Revised: 09/25/2014 Document Reviewed: 07/15/2014 Elsevier Interactive Patient Education 2016 [employer]  

## 2016-03-17 ENCOUNTER — Other Ambulatory Visit (HOSPITAL_COMMUNITY): Payer: Self-pay | Admitting: Internal Medicine

## 2016-03-17 ENCOUNTER — Other Ambulatory Visit: Payer: Self-pay | Admitting: Hematology and Oncology

## 2016-03-17 DIAGNOSIS — Z1231 Encounter for screening mammogram for malignant neoplasm of breast: Secondary | ICD-10-CM

## 2016-03-22 ENCOUNTER — Ambulatory Visit (HOSPITAL_COMMUNITY): Payer: PRIVATE HEALTH INSURANCE

## 2016-03-22 ENCOUNTER — Ambulatory Visit (HOSPITAL_COMMUNITY)
Admission: RE | Admit: 2016-03-22 | Discharge: 2016-03-22 | Disposition: A | Discharge status: Home / Self Care | Payer: BLUE CROSS/BLUE SHIELD | Source: Ambulatory Visit | Attending: Internal Medicine | Admitting: Internal Medicine

## 2016-03-22 DIAGNOSIS — Z1231 Encounter for screening mammogram for malignant neoplasm of breast: Secondary | ICD-10-CM | POA: Diagnosis present

## 2016-04-19 ENCOUNTER — Telehealth: Payer: Self-pay

## 2016-04-19 NOTE — Telephone Encounter (Signed)
Received VM from pt questioning information on appointment scheduled for tomorrow.  Called pt to inform her she had an appointment to see Dr. Lindi Adie at 8:15am tomorrow.  Unable to reach pt as phone continued to ring until eventually hanging up.  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  I will remove this number from pt's chart.  I will attempt to contact pt again at later time.

## 2016-04-19 NOTE — Assessment & Plan Note (Signed)
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 Based on NCCN guidelines, this is a pathogenic mutation that has been associated with risk of not only breast cancer but also colon, thyroid and kidney cancers.  Lifetime risk of breast cancer in vary from 28% to 38%. Higher with family history of breast cancer.   Breast Cancer Surveillance: 1. Breast exam 04/22/15: Normal 2. Mammogram 03/24/16 No abnormalities. Breast Density Category B. Discussed the differences between different breast density categories.  Patient would like a referral to general surgery for bilateral mastectomies. She prefers to see Dr. Excell Seltzer who had operated on her sister. We sent a referral for that. I recommended that she get an annual breast MRI. Last MRI was in 2015  RTC in 1 year

## 2016-04-20 ENCOUNTER — Encounter: Payer: Self-pay | Admitting: Hematology and Oncology

## 2016-04-20 ENCOUNTER — Ambulatory Visit (HOSPITAL_BASED_OUTPATIENT_CLINIC_OR_DEPARTMENT_OTHER): Payer: BLUE CROSS/BLUE SHIELD | Admitting: Hematology and Oncology

## 2016-04-20 ENCOUNTER — Telehealth: Payer: Self-pay | Admitting: Hematology and Oncology

## 2016-04-20 DIAGNOSIS — Z1501 Genetic susceptibility to malignant neoplasm of breast: Secondary | ICD-10-CM

## 2016-04-20 DIAGNOSIS — Z803 Family history of malignant neoplasm of breast: Secondary | ICD-10-CM | POA: Diagnosis not present

## 2016-04-20 MED ORDER — LOSARTAN POTASSIUM-HCTZ 50-12.5 MG PO TABS: 1.0000 | ORAL_TABLET | Freq: Every day | ORAL | Status: DC

## 2016-04-20 NOTE — Progress Notes (Signed)
Patient Care Team: Carlos Levering, PA-C as PCP - General (Family Medicine)  DIAGNOSIS: CHEK-2 mutation   CHIEF COMPLIANT: follow-up and surveillance for breast cancer  INTERVAL HISTORY: Christine Mason is a 45 year old with above-mentioned history of CHEK-2 mutation. She is here for surveillance. She is not certain about doing bilateral mastectomies. She would rather be followed with surveillance mammograms and breast MRIs. She denies any lumps or nodules in the breasts.  REVIEW OF SYSTEMS:   Constitutional: Denies fevers, chills or abnormal weight loss Eyes: Denies blurriness of vision Ears, nose, mouth, throat, and face: Denies mucositis or sore throat Respiratory: Denies cough, dyspnea or wheezes Cardiovascular: Denies palpitation, chest discomfort Gastrointestinal:  Denies nausea, heartburn or change in bowel habits Skin: Denies abnormal skin rashes Lymphatics: Denies new lymphadenopathy or easy bruising Neurological:Denies numbness, tingling or new weaknesses Behavioral/Psych: Mood is stable, no new changes  Extremities: No lower extremity edema Breast:  denies any pain or lumps or nodules in either breasts All other systems were reviewed with the patient and are negative.  I have reviewed the past medical history, past surgical history, social history and family history with the patient and they are unchanged from previous note.  ALLERGIES:  is allergic to penicillins.  MEDICATIONS:  Current Outpatient Prescriptions  Medication Sig Dispense Refill  . aspirin EC 81 MG tablet Take 81 mg by mouth every morning.     Marland Kitchen b complex vitamins tablet Take 1 tablet by mouth every morning.     . cloNIDine (CATAPRES) 0.2 MG tablet Take 0.2 mg by mouth 2 (two) times daily.    Marland Kitchen gabapentin (NEURONTIN) 300 MG capsule Take 300 mg by mouth 4 (four) times daily.     Marland Kitchen losartan-hydrochlorothiazide (HYZAAR) 50-12.5 MG tablet Take 1 tablet by mouth daily.    . Multiple Vitamins-Minerals  (MULTIVITAMIN WITH MINERALS) tablet Take 1 tablet by mouth daily.    . ondansetron (ZOFRAN) 4 MG tablet Take 1 tablet (4 mg total) by mouth every 8 (eight) hours as needed for nausea or vomiting. 6 tablet 0  . pantoprazole (PROTONIX) 40 MG tablet Take 40 mg by mouth daily.      . traZODone (DESYREL) 100 MG tablet Take 100 mg by mouth at bedtime.    Marland Kitchen venlafaxine XR (EFFEXOR-XR) 75 MG 24 hr capsule Take 225 mg by mouth daily.     No current facility-administered medications for this visit.     PHYSICAL EXAMINATION: ECOG PERFORMANCE STATUS: 0 - Asymptomatic  Vitals:   04/20/16 0859  BP: (!) 132/93  Pulse: 90  Resp: 18  Temp: 98.9 F (37.2 C)   Filed Weights   04/20/16 0859  Weight: 294 lb (133.4 kg)    GENERAL:alert, no distress and comfortable SKIN: skin color, texture, turgor are normal, no rashes or significant lesions EYES: normal, Conjunctiva are pink and non-injected, sclera clear OROPHARYNX:no exudate, no erythema and lips, buccal mucosa, and tongue normal  NECK: supple, thyroid normal size, non-tender, without nodularity LYMPH:  no palpable lymphadenopathy in the cervical, axillary or inguinal LUNGS: clear to auscultation and percussion with normal breathing effort HEART: regular rate & rhythm and no murmurs and no lower extremity edema ABDOMEN:abdomen soft, non-tender and normal bowel sounds MUSCULOSKELETAL:no cyanosis of digits and no clubbing  NEURO: alert & oriented x 3 with fluent speech, no focal motor/sensory deficits EXTREMITIES: No lower extremity edema BREAST: No palpable masses or nodules in either right or left breasts. No palpable axillary supraclavicular or infraclavicular adenopathy no breast tenderness  or nipple discharge. (exam performed in the presence of a chaperone)  LABORATORY DATA:  I have reviewed the data as listed   Chemistry      Component Value Date/Time   NA 137 06/27/2015 1933   NA 141 09/03/2014 0941   K 3.8 06/27/2015 1933   K 3.9  09/03/2014 0941   CL 103 06/27/2015 1933   CO2 27 06/27/2015 1933   CO2 26 09/03/2014 0941   BUN 14 06/27/2015 1933   BUN 11.4 09/03/2014 0941   CREATININE 0.67 06/27/2015 1933   CREATININE 0.8 09/03/2014 0941      Component Value Date/Time   CALCIUM 9.9 06/27/2015 1933   CALCIUM 9.7 09/03/2014 0941   ALKPHOS 58 06/27/2015 1933   ALKPHOS 72 09/03/2014 0941   AST 26 06/27/2015 1933   AST 24 09/03/2014 0941   ALT 31 06/27/2015 1933   ALT 30 09/03/2014 0941   BILITOT 0.4 06/27/2015 1933   BILITOT 0.48 09/03/2014 0941       Lab Results  Component Value Date   WBC 9.7 06/27/2015   HGB 13.2 06/27/2015   HCT 39.3 06/27/2015   MCV 93.6 06/27/2015   PLT 322 06/27/2015   NEUTROABS 5.2 06/27/2015     ASSESSMENT & PLAN:  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 Based on NCCN guidelines, this is a pathogenic mutation that has been associated with risk of not only breast cancer but also colon, thyroid and kidney cancers.  Lifetime risk of breast cancer in vary from 28% to 38%. Higher with family history of breast cancer.   Breast Cancer Surveillance: 1. Breast exam 04/22/15: Normal 2. Mammogram 03/24/16 No abnormalities. Breast Density Category B. Discussed the differences between different breast density categories.  Patient initially wanted to see Dr. Excell Seltzer to consider bilateral mastectomies but she made up her decision not to do the surgery at this time. She would like to be watched with mammograms and bilateral MRIs for surveillance. I recommended that she get an annual breast MRI. Last MRI was in 2015. I will send an order for another MRI to be done.  RTC in 1 year   No orders of the defined types were placed in this encounter.  The patient has a good understanding of the overall plan. she agrees with it. she will call with any problems that may develop before the next visit  here.   Rulon Eisenmenger, MD 04/20/16

## 2016-04-20 NOTE — Telephone Encounter (Signed)
per pof to sch pt appt-gave pt copy of avs/cal-adv pt to [facility] to make Breast MRI appt

## 2016-05-06 ENCOUNTER — Other Ambulatory Visit: Payer: BLUE CROSS/BLUE SHIELD

## 2016-05-16 ENCOUNTER — Inpatient Hospital Stay: Admission: RE | Admit: 2016-05-16 | Payer: BLUE CROSS/BLUE SHIELD | Source: Ambulatory Visit

## 2017-04-20 ENCOUNTER — Telehealth: Payer: Self-pay

## 2017-04-20 ENCOUNTER — Ambulatory Visit (HOSPITAL_BASED_OUTPATIENT_CLINIC_OR_DEPARTMENT_OTHER): Payer: BLUE CROSS/BLUE SHIELD | Admitting: Hematology and Oncology

## 2017-04-20 ENCOUNTER — Encounter: Payer: Self-pay | Admitting: Hematology and Oncology

## 2017-04-20 ENCOUNTER — Other Ambulatory Visit: Payer: Self-pay | Admitting: Emergency Medicine

## 2017-04-20 DIAGNOSIS — Z1231 Encounter for screening mammogram for malignant neoplasm of breast: Secondary | ICD-10-CM

## 2017-04-20 DIAGNOSIS — Z1501 Genetic susceptibility to malignant neoplasm of breast: Secondary | ICD-10-CM | POA: Diagnosis not present

## 2017-04-20 MED ORDER — TRAMADOL HCL 50 MG PO TABS: 50.0000 mg | ORAL_TABLET | Freq: Every day | ORAL | Status: DC

## 2017-04-20 MED ORDER — GABAPENTIN 300 MG PO CAPS: 300.0000 mg | ORAL_CAPSULE | Freq: Every day | ORAL | Status: DC

## 2017-04-20 MED ORDER — METHOCARBAMOL 750 MG PO TABS: 750.0000 mg | ORAL_TABLET | Freq: Every day | ORAL | Status: DC

## 2017-04-20 MED ORDER — ONDANSETRON HCL 4 MG PO TABS: 4.0000 mg | ORAL_TABLET | Freq: Three times a day (TID) | ORAL | 1 refills | Status: DC | PRN

## 2017-04-20 MED ORDER — ALPRAZOLAM 0.25 MG PO TABS: 0.2500 mg | ORAL_TABLET | Freq: Every evening | ORAL | 0 refills | Status: DC | PRN

## 2017-04-20 NOTE — Telephone Encounter (Signed)
Spoke with patient about up coming appointment on 04/19/2018

## 2017-04-20 NOTE — Assessment & Plan Note (Signed)
CHEK 2 p.Q222L variant: Based on NCCN guidelines, this is a pathogenic mutation that has been associated with risk of not only breast cancer but also colon, thyroid and kidney cancers.  Lifetime risk of breast cancer in vary from 28% to 38%. Higher with family history of breast cancer.   Breast Cancer Surveillance: 1. Breast exam 04/20/17: Normal 2. Mammogram 03/22/16 No abnormalities. Breast Density Category B.  Patient has not done breast MRIs even though we recommended it.  Patient initially wanted to see Dr. Excell Seltzer to consider bilateral mastectomies but she made up her decision not to do the surgery at this time.  I once again recommended that she get an annual breast MRI. Last MRI was in 2015. I will send an order for another MRI to be done.  RTC in 1 year

## 2017-04-20 NOTE — Progress Notes (Signed)
Patient Care Team: Carlos Levering, PA-C as PCP - General (Family Medicine)  DIAGNOSIS:  Encounter Diagnosis  Name Primary?  . 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  She has not done her annual breast MRIs primarily because of cost concerns. She feels that if she does it at the hospital it would be cheaper for her. Apparently she tried to do this at [facility] but their machine would not fit her. She would like to try at [facility]. She denies any lumps or nodules in the breast. She has chronic back pain for which she takes multiple medications.  REVIEW OF SYSTEMS:   Constitutional: Denies fevers, chills or abnormal weight loss Eyes: Denies blurriness of vision Ears, nose, mouth, throat, and face: Denies mucositis or sore throat Respiratory: Denies cough, dyspnea or wheezes Cardiovascular: Denies palpitation, chest discomfort Gastrointestinal:  Denies nausea, heartburn or change in bowel habits Skin: Denies abnormal skin rashes Lymphatics: Denies new lymphadenopathy or easy bruising Neurological:Denies numbness, tingling or new weaknesses Behavioral/Psych: Mood is stable, no new changes  Extremities: No lower extremity edema Breast:  denies any pain or lumps or nodules in either breasts All other systems were reviewed with the patient and are negative.  I have reviewed the past medical history, past surgical history, social history and family history with the patient and they are unchanged from previous note.  ALLERGIES:  is allergic to penicillins.  MEDICATIONS:  Current Outpatient Prescriptions  Medication Sig Dispense Refill  . aspirin EC 81 MG tablet Take 81 mg by mouth every morning.     Marland Kitchen b complex vitamins tablet Take 1 tablet by mouth every morning.     . cloNIDine (CATAPRES)  0.2 MG tablet Take 0.2 mg by mouth 2 (two) times daily.    Marland Kitchen gabapentin (NEURONTIN) 300 MG capsule Take 300 mg by mouth 4 (four) times daily.     Marland Kitchen losartan-hydrochlorothiazide (HYZAAR) 50-12.5 MG tablet Take 1 tablet by mouth daily.    . Multiple Vitamins-Minerals (MULTIVITAMIN WITH MINERALS) tablet Take 1 tablet by mouth daily.    . ondansetron (ZOFRAN) 4 MG tablet Take 1 tablet (4 mg total) by mouth every 8 (eight) hours as needed for nausea or vomiting. 6 tablet 0  . pantoprazole (PROTONIX) 40 MG tablet Take 40 mg by mouth daily.      . traZODone (DESYREL) 100 MG tablet Take 100 mg by mouth at bedtime.    Marland Kitchen venlafaxine XR (EFFEXOR-XR) 75 MG 24 hr capsule Take 225 mg by mouth daily.     No current facility-administered medications for this visit.     PHYSICAL EXAMINATION: ECOG PERFORMANCE STATUS: 1 - Symptomatic but completely ambulatory  Vitals:   04/20/17 0800  BP: 134/78  Pulse: 85  Resp: 18  Temp: 99 F (37.2 C)   Filed Weights   04/20/17 0800  Weight: 295 lb 6.4 oz (134 kg)    GENERAL:alert, no distress and comfortable SKIN: skin color, texture, turgor are normal, no rashes or significant lesions EYES: normal, Conjunctiva are pink and non-injected, sclera clear OROPHARYNX:no exudate, no erythema and lips, buccal mucosa, and tongue normal  NECK: supple, thyroid normal size, non-tender, without nodularity LYMPH:  no palpable lymphadenopathy in the cervical, axillary or inguinal LUNGS: clear to auscultation and percussion with normal breathing effort HEART: regular rate & rhythm and  no murmurs and no lower extremity edema ABDOMEN:abdomen soft, non-tender and normal bowel sounds MUSCULOSKELETAL:no cyanosis of digits and no clubbing  NEURO: alert & oriented x 3 with fluent speech, no focal motor/sensory deficits EXTREMITIES: No lower extremity edema BREAST: No palpable masses or nodules in either right or left breasts. No palpable axillary supraclavicular or infraclavicular  adenopathy no breast tenderness or nipple discharge. (exam performed in the presence of a chaperone)  LABORATORY DATA:  I have reviewed the data as listed   Chemistry      Component Value Date/Time   NA 137 06/27/2015 1933   NA 141 09/03/2014 0941   K 3.8 06/27/2015 1933   K 3.9 09/03/2014 0941   CL 103 06/27/2015 1933   CO2 27 06/27/2015 1933   CO2 26 09/03/2014 0941   BUN 14 06/27/2015 1933   BUN 11.4 09/03/2014 0941   CREATININE 0.67 06/27/2015 1933   CREATININE 0.8 09/03/2014 0941      Component Value Date/Time   CALCIUM 9.9 06/27/2015 1933   CALCIUM 9.7 09/03/2014 0941   ALKPHOS 58 06/27/2015 1933   ALKPHOS 72 09/03/2014 0941   AST 26 06/27/2015 1933   AST 24 09/03/2014 0941   ALT 31 06/27/2015 1933   ALT 30 09/03/2014 0941   BILITOT 0.4 06/27/2015 1933   BILITOT 0.48 09/03/2014 0941       Lab Results  Component Value Date   WBC 9.7 06/27/2015   HGB 13.2 06/27/2015   HCT 39.3 06/27/2015   MCV 93.6 06/27/2015   PLT 322 06/27/2015   NEUTROABS 5.2 06/27/2015    ASSESSMENT & PLAN:  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  Lifetime risk of breast cancer in vary from 28% to 38%. Higher with family history of breast cancer.   Breast Cancer Surveillance: 1. Breast exam 04/20/17: Normal 2. Mammogram 03/22/16 No abnormalities. Breast Density Category B.  Patient has not done breast MRIs even though we recommended It primarily because of cost concerns .  Patient initially wanted to see Dr. Excell Seltzer to consider bilateral mastectomies but she made up her decision not to do the surgery at this time. She feels that she would like to see the breast MRI before contemplating on surgery.  I once again recommended that she get an annual breast MRI. Last MRI was in 2015. I will send  an order for another MRI to be done At [place] in 1 year  I spent 15 minutes talking to the patient of which more than half was spent in counseling and coordination of care.  No orders of the defined types were placed in this encounter.  The patient has a good understanding of the overall plan. she agrees with it. she will call with any problems that may develop before the next visit here.   Rulon Eisenmenger, MD 04/20/17

## 2017-04-26 ENCOUNTER — Other Ambulatory Visit: Payer: Self-pay

## 2017-04-26 MED ORDER — ONDANSETRON HCL 4 MG PO TABS: 4.0000 mg | ORAL_TABLET | Freq: Three times a day (TID) | ORAL | 1 refills | Status: DC | PRN

## 2017-06-14 ENCOUNTER — Other Ambulatory Visit: Payer: Self-pay | Admitting: Internal Medicine

## 2017-06-14 DIAGNOSIS — Z1231 Encounter for screening mammogram for malignant neoplasm of breast: Secondary | ICD-10-CM

## 2017-06-20 ENCOUNTER — Ambulatory Visit (HOSPITAL_COMMUNITY)
Admission: RE | Admit: 2017-06-20 | Discharge: 2017-06-20 | Disposition: A | Discharge status: Home / Self Care | Payer: BLUE CROSS/BLUE SHIELD | Source: Ambulatory Visit | Attending: Internal Medicine | Admitting: Internal Medicine

## 2017-06-20 ENCOUNTER — Encounter (HOSPITAL_COMMUNITY): Payer: Self-pay

## 2017-06-20 DIAGNOSIS — Z1231 Encounter for screening mammogram for malignant neoplasm of breast: Secondary | ICD-10-CM | POA: Diagnosis not present

## 2017-06-21 ENCOUNTER — Ambulatory Visit: Payer: BLUE CROSS/BLUE SHIELD

## 2018-01-10 ENCOUNTER — Ambulatory Visit (HOSPITAL_COMMUNITY)
Admission: RE | Admit: 2018-01-10 | Discharge: 2018-01-10 | Disposition: A | Discharge status: Home / Self Care | Payer: BLUE CROSS/BLUE SHIELD | Source: Ambulatory Visit | Attending: Adult Health Nurse Practitioner | Admitting: Adult Health Nurse Practitioner

## 2018-01-10 ENCOUNTER — Other Ambulatory Visit (HOSPITAL_COMMUNITY): Payer: Self-pay | Admitting: Adult Health Nurse Practitioner

## 2018-01-10 DIAGNOSIS — R05 Cough: Secondary | ICD-10-CM

## 2018-01-10 DIAGNOSIS — R059 Cough, unspecified: Secondary | ICD-10-CM

## 2018-03-16 ENCOUNTER — Emergency Department (HOSPITAL_COMMUNITY)
Admission: EM | Admit: 2018-03-16 | Discharge: 2018-03-16 | Disposition: A | Discharge status: Home / Self Care | Payer: BLUE CROSS/BLUE SHIELD | Attending: Emergency Medicine | Admitting: Emergency Medicine

## 2018-03-16 ENCOUNTER — Encounter (HOSPITAL_COMMUNITY): Payer: Self-pay | Admitting: Emergency Medicine

## 2018-03-16 ENCOUNTER — Other Ambulatory Visit: Payer: Self-pay

## 2018-03-16 ENCOUNTER — Emergency Department (HOSPITAL_COMMUNITY): Payer: BLUE CROSS/BLUE SHIELD

## 2018-03-16 DIAGNOSIS — Y92410 Unspecified street and highway as the place of occurrence of the external cause: Secondary | ICD-10-CM | POA: Diagnosis not present

## 2018-03-16 DIAGNOSIS — Y999 Unspecified external cause status: Secondary | ICD-10-CM | POA: Insufficient documentation

## 2018-03-16 DIAGNOSIS — I10 Essential (primary) hypertension: Secondary | ICD-10-CM | POA: Diagnosis not present

## 2018-03-16 DIAGNOSIS — S29012A Strain of muscle and tendon of back wall of thorax, initial encounter: Secondary | ICD-10-CM | POA: Diagnosis not present

## 2018-03-16 DIAGNOSIS — Z79899 Other long term (current) drug therapy: Secondary | ICD-10-CM | POA: Insufficient documentation

## 2018-03-16 DIAGNOSIS — Z7982 Long term (current) use of aspirin: Secondary | ICD-10-CM | POA: Diagnosis not present

## 2018-03-16 DIAGNOSIS — Y9389 Activity, other specified: Secondary | ICD-10-CM | POA: Insufficient documentation

## 2018-03-16 DIAGNOSIS — Z87891 Personal history of nicotine dependence: Secondary | ICD-10-CM | POA: Insufficient documentation

## 2018-03-16 DIAGNOSIS — S299XXA Unspecified injury of thorax, initial encounter: Secondary | ICD-10-CM | POA: Diagnosis present
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
## 2018-04-19 ENCOUNTER — Inpatient Hospital Stay: Payer: BLUE CROSS/BLUE SHIELD | Attending: Hematology | Admitting: Hematology and Oncology

## 2018-04-19 ENCOUNTER — Telehealth: Payer: Self-pay | Admitting: Hematology and Oncology

## 2018-04-19 DIAGNOSIS — Z1501 Genetic susceptibility to malignant neoplasm of breast: Secondary | ICD-10-CM

## 2018-04-19 DIAGNOSIS — Z1509 Genetic susceptibility to other malignant neoplasm: Secondary | ICD-10-CM

## 2018-04-19 DIAGNOSIS — Z1231 Encounter for screening mammogram for malignant neoplasm of breast: Secondary | ICD-10-CM

## 2018-04-19 MED ORDER — ATENOLOL 50 MG PO TABS: 50.0000 mg | ORAL_TABLET | Freq: Every day | ORAL | Status: AC

## 2018-04-19 MED ORDER — MELOXICAM 15 MG PO TABS: 15.0000 mg | ORAL_TABLET | Freq: Every day | ORAL | Status: DC

## 2018-04-19 NOTE — Telephone Encounter (Signed)
Gave patient avs and calendar of upcoming appts.  °

## 2018-04-19 NOTE — Assessment & Plan Note (Signed)
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  Lifetime risk of breast cancer in vary from 28% to 38%. Higher with family history of breast cancer.   Breast Cancer Surveillance: 1. Breast exam 04/2018: Normal 2. Mammogram 7/5/17No abnormalities. Breast Density Category B.  Patient has not done breast MRIs even though we recommended It primarily because of cost concerns .  Patient initially wanted to see Dr. Excell Seltzer to consider bilateral mastectomies but she made up her decision not to do the surgery at this time.   Return to clinic in 1 year for follow-up

## 2018-04-19 NOTE — Progress Notes (Signed)
Patient Care Team: Carlos Levering, PA-C as PCP - General (Family Medicine)  DIAGNOSIS:  Encounter Diagnosis  Name Primary?  . 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  She reports no pain or discomfort in the breast.  Denies any lumps or nodules.  She had mammograms in October 2018 which were normal.  She has not had a breast MRI because of cost concerns.  I had previously ordered for MRIs but none of them were done because she does not think that they are covered by her insurance. She is complaining of fatigue.  She is on multiple medications which may be causing her symptoms.  REVIEW OF SYSTEMS:   Constitutional: Denies fevers, chills or abnormal weight loss Eyes: Denies blurriness of vision Ears, nose, mouth, throat, and face: Denies mucositis or sore throat Respiratory: Denies cough, dyspnea or wheezes Cardiovascular: Denies palpitation, chest discomfort Gastrointestinal:  Denies nausea, heartburn or change in bowel habits Skin: Denies abnormal skin rashes Lymphatics: Denies new lymphadenopathy or easy bruising Neurological:Denies numbness, tingling or new weaknesses Behavioral/Psych: Mood is stable, no new changes  Extremities: No lower extremity edema Breast:  denies any pain or lumps or nodules in either breasts All other systems were reviewed with the patient and are negative.  I have reviewed the past medical history, past surgical history, social history and family history with the patient and they are unchanged from previous note.  ALLERGIES:  is allergic to penicillins.  MEDICATIONS:  Current Outpatient Medications  Medication Sig Dispense Refill  . ALPRAZolam (XANAX) 0.25 MG tablet Take 1 tablet (0.25 mg total) by mouth at bedtime as needed for  anxiety. 30 tablet 0  . aspirin EC 81 MG tablet Take 81 mg by mouth every morning.     Marland Kitchen b complex vitamins tablet Take 1 tablet by mouth every morning.     . cloNIDine (CATAPRES) 0.2 MG tablet Take 0.2 mg by mouth 2 (two) times daily.    Marland Kitchen gabapentin (NEURONTIN) 300 MG capsule Take 1 capsule (300 mg total) by mouth at bedtime.    Marland Kitchen losartan-hydrochlorothiazide (HYZAAR) 50-12.5 MG tablet Take 1 tablet by mouth daily.    . methocarbamol (ROBAXIN-750) 750 MG tablet Take 1 tablet (750 mg total) by mouth at bedtime.    . Multiple Vitamins-Minerals (MULTIVITAMIN WITH MINERALS) tablet Take 1 tablet by mouth daily.    . ondansetron (ZOFRAN) 4 MG tablet Take 1 tablet (4 mg total) by mouth every 8 (eight) hours as needed for nausea or vomiting. 30 tablet 1  . pantoprazole (PROTONIX) 40 MG tablet Take 40 mg by mouth daily.      . traMADol (ULTRAM) 50 MG tablet Take 1 tablet (50 mg total) by mouth at bedtime. 30 tablet   . traZODone (DESYREL) 100 MG tablet Take 100 mg by mouth at bedtime.    Marland Kitchen venlafaxine XR (EFFEXOR-XR) 75 MG 24 hr capsule Take 225 mg by mouth daily.     No current facility-administered medications for this visit.     PHYSICAL EXAMINATION: ECOG PERFORMANCE STATUS: 1 - Symptomatic but completely ambulatory  Vitals:   04/19/18 0903  BP: 134/83  Pulse: 72  Resp: 20  Temp: 98.8 F (37.1 C)  SpO2: 95%   Filed Weights   04/19/18 0903  Weight: (!) 301 lb  12.8 oz (136.9 kg)    GENERAL:alert, no distress and comfortable SKIN: skin color, texture, turgor are normal, no rashes or significant lesions EYES: normal, Conjunctiva are pink and non-injected, sclera clear OROPHARYNX:no exudate, no erythema and lips, buccal mucosa, and tongue normal  NECK: supple, thyroid normal size, non-tender, without nodularity LYMPH:  no palpable lymphadenopathy in the cervical, axillary or inguinal LUNGS: clear to auscultation and percussion with normal breathing effort HEART: regular rate & rhythm  and no murmurs and no lower extremity edema ABDOMEN:abdomen soft, non-tender and normal bowel sounds MUSCULOSKELETAL:no cyanosis of digits and no clubbing  NEURO: alert & oriented x 3 with fluent speech, no focal motor/sensory deficits EXTREMITIES: No lower extremity edema BREAST: No palpable masses or nodules in either right or left breasts. No palpable axillary supraclavicular or infraclavicular adenopathy no breast tenderness or nipple discharge. (exam performed in the presence of a chaperone)  LABORATORY DATA:  I have reviewed the data as listed CMP Latest Ref Rng & Units 06/27/2015 09/29/2014 09/03/2014  Glucose 65 - 99 mg/dL 95 101(H) 104  BUN 6 - 20 mg/dL 14 14 11.4  Creatinine 0.44 - 1.00 mg/dL 0.67 0.64 0.8  Sodium 135 - 145 mmol/L 137 140 141  Potassium 3.5 - 5.1 mmol/L 3.8 3.6 3.9  Chloride 101 - 111 mmol/L 103 104 -  CO2 22 - 32 mmol/L 27 28 26   Calcium 8.9 - 10.3 mg/dL 9.9 9.5 9.7  Total Protein 6.5 - 8.1 g/dL 7.5 - 7.4  Total Bilirubin 0.3 - 1.2 mg/dL 0.4 - 0.48  Alkaline Phos 38 - 126 U/L 58 - 72  AST 15 - 41 U/L 26 - 24  ALT 14 - 54 U/L 31 - 30    Lab Results  Component Value Date   WBC 9.7 06/27/2015   HGB 13.2 06/27/2015   HCT 39.3 06/27/2015   MCV 93.6 06/27/2015   PLT 322 06/27/2015   NEUTROABS 5.2 06/27/2015    ASSESSMENT & PLAN:  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  Lifetime risk of breast cancer in vary from 28% to 38%. Higher with family history of breast cancer.   Breast Cancer Surveillance: 1. Breast exam 04/2018: Normal 2. Mammogram 10/3/2018n abnormalities. Breast Density Category A.  Patient has not done breast MRIs even though we recommended It primarily because of cost concerns . I ordered a breast MRI once again.  Patient initially wanted  to see Dr. Excell Seltzer to consider bilateral mastectomies but she made up her decision not to do the surgery at this time.   Return to clinic in 1 year for follow-up  No orders of the defined types were placed in this encounter.  The patient has a good understanding of the overall plan. she agrees with it. she will call with any problems that may develop before the next visit here.   Harriette Ohara, MD 04/19/18

## 2018-05-18 ENCOUNTER — Ambulatory Visit (HOSPITAL_COMMUNITY): Admission: RE | Admit: 2018-05-18 | Payer: BLUE CROSS/BLUE SHIELD | Source: Ambulatory Visit

## 2018-07-05 ENCOUNTER — Other Ambulatory Visit (HOSPITAL_COMMUNITY): Payer: Self-pay | Admitting: Adult Health Nurse Practitioner

## 2018-07-05 ENCOUNTER — Ambulatory Visit (HOSPITAL_COMMUNITY)
Admission: RE | Admit: 2018-07-05 | Discharge: 2018-07-05 | Disposition: A | Discharge status: Home / Self Care | Payer: BLUE CROSS/BLUE SHIELD | Source: Ambulatory Visit | Attending: Adult Health Nurse Practitioner | Admitting: Adult Health Nurse Practitioner

## 2018-07-05 ENCOUNTER — Ambulatory Visit (HOSPITAL_COMMUNITY): Payer: Self-pay

## 2018-07-05 DIAGNOSIS — M5440 Lumbago with sciatica, unspecified side: Secondary | ICD-10-CM | POA: Insufficient documentation

## 2018-07-05 DIAGNOSIS — M8938 Hypertrophy of bone, other site: Secondary | ICD-10-CM | POA: Diagnosis not present

## 2018-07-12 ENCOUNTER — Other Ambulatory Visit (HOSPITAL_COMMUNITY): Payer: Self-pay | Admitting: Internal Medicine

## 2018-07-12 DIAGNOSIS — Z1231 Encounter for screening mammogram for malignant neoplasm of breast: Secondary | ICD-10-CM

## 2018-07-22 ENCOUNTER — Ambulatory Visit (HOSPITAL_COMMUNITY)
Admission: RE | Admit: 2018-07-22 | Discharge: 2018-07-22 | Disposition: A | Discharge status: Home / Self Care | Payer: BLUE CROSS/BLUE SHIELD | Source: Ambulatory Visit | Attending: Internal Medicine | Admitting: Internal Medicine

## 2018-07-22 ENCOUNTER — Encounter (HOSPITAL_COMMUNITY): Payer: Self-pay

## 2018-07-22 DIAGNOSIS — Z1231 Encounter for screening mammogram for malignant neoplasm of breast: Secondary | ICD-10-CM

## 2018-07-23 ENCOUNTER — Other Ambulatory Visit: Payer: Self-pay | Admitting: Hematology and Oncology

## 2018-08-01 ENCOUNTER — Other Ambulatory Visit: Payer: Self-pay

## 2018-08-01 ENCOUNTER — Emergency Department (HOSPITAL_COMMUNITY)
Admission: EM | Admit: 2018-08-01 | Discharge: 2018-08-01 | Disposition: A | Discharge status: Home / Self Care | Payer: BLUE CROSS/BLUE SHIELD | Attending: Emergency Medicine | Admitting: Emergency Medicine

## 2018-08-01 ENCOUNTER — Encounter (HOSPITAL_COMMUNITY): Payer: Self-pay | Admitting: Emergency Medicine

## 2018-08-01 DIAGNOSIS — Z87891 Personal history of nicotine dependence: Secondary | ICD-10-CM | POA: Diagnosis not present

## 2018-08-01 DIAGNOSIS — F329 Major depressive disorder, single episode, unspecified: Secondary | ICD-10-CM | POA: Insufficient documentation

## 2018-08-01 DIAGNOSIS — Z9049 Acquired absence of other specified parts of digestive tract: Secondary | ICD-10-CM | POA: Insufficient documentation

## 2018-08-01 DIAGNOSIS — I10 Essential (primary) hypertension: Secondary | ICD-10-CM | POA: Diagnosis not present

## 2018-08-01 DIAGNOSIS — Z79899 Other long term (current) drug therapy: Secondary | ICD-10-CM | POA: Diagnosis not present

## 2018-08-01 LAB — COMPREHENSIVE METABOLIC PANEL
ALT: 61 U/L — ABNORMAL HIGH (ref 0–44)
AST: 41 U/L (ref 15–41)
Albumin: 4.1 g/dL (ref 3.5–5.0)
Alkaline Phosphatase: 48 U/L (ref 38–126)
Anion gap: 9 (ref 5–15)
BUN: 11 mg/dL (ref 6–20)
CO2: 26 mmol/L (ref 22–32)
Calcium: 9.8 mg/dL (ref 8.9–10.3)
Chloride: 104 mmol/L (ref 98–111)
Creatinine, Ser: 0.61 mg/dL (ref 0.44–1.00)
GFR calc Af Amer: >60 mL/min (ref >60)
GFR calc non Af Amer: >60 mL/min (ref >60)
Glucose, Bld: 89 mg/dL (ref 70–99)
Potassium: 3.7 mmol/L (ref 3.5–5.1)
Sodium: 139 mmol/L (ref 135–145)
Total Bilirubin: 0.7 mg/dL (ref 0.3–1.2)
Total Protein: 7.4 g/dL (ref 6.5–8.1)

## 2018-08-01 LAB — CBC WITH DIFFERENTIAL/PLATELET
Abs Immature Granulocytes: 0.02 10*3/uL (ref 0.00–0.07)
Basophils Absolute: 0 10*3/uL (ref 0.0–0.1)
Basophils Relative: 1 %
Eosinophils Absolute: 0.1 10*3/uL (ref 0.0–0.5)
Eosinophils Relative: 1 %
HCT: 40.6 % (ref 36.0–46.0)
Hemoglobin: 13.3 g/dL (ref 12.0–15.0)
Immature Granulocytes: 0 %
Lymphocytes Relative: 42 %
Lymphs Abs: 2.7 10*3/uL (ref 0.7–4.0)
MCH: 31.9 pg (ref 26.0–34.0)
MCHC: 32.8 g/dL (ref 30.0–36.0)
MCV: 97.4 fL (ref 80.0–100.0)
Monocytes Absolute: 0.5 10*3/uL (ref 0.1–1.0)
Monocytes Relative: 7 %
Neutro Abs: 3.1 10*3/uL (ref 1.7–7.7)
Neutrophils Relative %: 49 %
Platelets: 300 10*3/uL (ref 150–400)
RBC: 4.17 MIL/uL (ref 3.87–5.11)
RDW: 13.2 % (ref 11.5–15.5)
WBC: 6.5 10*3/uL (ref 4.0–10.5)
nRBC: 0 % (ref 0.0–0.2)

## 2018-08-01 LAB — TSH: TSH: 0.372 u[IU]/mL (ref 0.350–4.500)

## 2018-08-01 NOTE — Discharge Instructions (Addendum)
Follow-up with your family doctor next week for recheck. 

## 2018-08-01 NOTE — ED Triage Notes (Signed)
Pt c/o elevated BP, blurred vision, and back pain x 2 days. Pt states BP was 105/109 yesterday and 150/90 this morning.

## 2018-08-01 NOTE — ED Provider Notes (Signed)
[facility] EMERGENCY DEPARTMENT Provider Note   CSN: [id number] Arrival date & time: 08/01/18  1448     History   Chief Complaint Chief Complaint  Patient presents with  . Hypertension    HPI Christine Mason is a 47 y.o. female.  Patient states she had some dizziness today and her blood pressure was elevated.  She feels much better now.  The history is provided by the patient.  Hypertension  This is a recurrent problem. The current episode started more than 2 days ago. The problem occurs constantly. The problem has not changed since onset.Pertinent negatives include no chest pain, no abdominal pain and no headaches. Nothing aggravates the symptoms. Nothing relieves the symptoms. She has tried nothing for the symptoms. The treatment provided no relief.    Past Medical History:  Diagnosis Date  . Chronic leg pain    right  . Chronic pain of left knee   . Depression   . Diverticulitis   . GERD (gastroesophageal reflux disease)   . Hypertension     Patient Active Problem List   Diagnosis Date Noted  . Genetic testing 06/03/2015  . 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 ACNE CYST REMOVAL    . CHOLECYSTECTOMY    . DILATION AND CURETTAGE OF UTERUS  1998  . KNEE SURGERY    . MASS EXCISION       OB History    Gravida  1   Para      Term      Preterm      AB  1   Living  0     SAB  1   TAB      Ectopic      Multiple      Live Births               Home Medications    Prior to Admission medications   Medication Sig Start Date End Date Taking? Authorizing Provider  ALPRAZolam Duanne Moron) 0.5 MG tablet Take 1 tablet by mouth at bedtime. 07/01/18  Yes [provider]  atenolol (TENORMIN) 50 MG tablet Take 1 tablet (50 mg total) by mouth daily. 04/19/18  Yes Nicholas Lose, MD  b complex vitamins tablet Take 1 tablet by mouth every morning.    Yes [provider]  cloNIDine (CATAPRES) 0.2 MG tablet Take 0.2 mg by mouth 2 (two) times daily.   Yes [provider]  docusate sodium (COLACE) 100 MG capsule Take 100 mg by mouth 2 (two) times daily.   Yes [provider]  gabapentin (NEURONTIN) 300 MG capsule Take 1 capsule (300 mg total) by mouth at bedtime. 04/20/17  Yes Nicholas Lose, MD  HYDROcodone-acetaminophen (NORCO/VICODIN) 5-325 MG tablet Take 1 tablet by mouth 2 (two) times daily as needed. 06/28/18  Yes [provider]  hydrOXYzine (VISTARIL) 25 MG capsule Take 1 capsule by mouth at bedtime as needed. 06/12/18  Yes [provider]  losartan-hydrochlorothiazide (HYZAAR) 100-25 MG tablet Take 1 tablet by mouth daily. 05/06/18  Yes [provider]  meloxicam (MOBIC) 15 MG tablet Take 1 tablet (15 mg total) by mouth daily. 04/19/18  Yes Nicholas Lose, MD  methocarbamol (ROBAXIN-750) 750 MG tablet Take 1 tablet (750 mg total) by mouth at bedtime. Patient taking differently: Take 750-1,500 mg by mouth every 8 (eight) hours as needed.  04/20/17  Yes Nicholas Lose, MD  Multiple Vitamins-Minerals (MULTIVITAMIN  WITH MINERALS) tablet Take 1 tablet by mouth daily.   Yes [provider]  ondansetron (ZOFRAN) 4 MG tablet Take 1 tablet (4 mg total) by mouth every 8 (eight) hours as needed for nausea or vomiting. 04/26/17  Yes Nicholas Lose, MD  pantoprazole (PROTONIX) 40 MG tablet Take 40 mg by mouth daily.     Yes [provider]  traMADol (ULTRAM) 50 MG tablet Take 1 tablet (50 mg total) by mouth at bedtime. 04/20/17  Yes Nicholas Lose, MD  traZODone (DESYREL) 100 MG tablet Take 100 mg by mouth at bedtime.   Yes [provider]  venlafaxine XR (EFFEXOR-XR) 75 MG 24 hr capsule Take 225 mg by mouth daily.   Yes [provider]    Family History Family History  Problem Relation Age of Onset  . Diabetes Mother   . Hypertension Mother   . Breast cancer Mother 23       dx again at  69; PALB2 and CHEK2 positive  . Diabetes Father   . Hypertension Father   . Coronary artery disease Father   . Hypertension Sister   . Breast cancer Sister        diagnosed in her 65's  . Thyroid cancer Maternal Uncle 46  . Lung cancer Maternal Grandmother   . Leukemia Maternal Grandfather 73       dx with hairy cell leukemia at 7; CLL at 60  . Lymphoma Maternal Grandfather 82       hodgkins lymphoma  . Brain cancer Paternal Grandmother   . Prostate cancer Paternal Grandfather   . Lung cancer Maternal Aunt 20  . Lymphoma Maternal Uncle 60       follicular lymphoma    Social History Social History   Tobacco Use  . Smoking status: Former Smoker    Packs/day: 0.50    Years: 5.00    Pack years: 2.50    Types: Cigarettes    Last attempt to quit: 09/15/2017    Years since quitting: 0.8  . Smokeless tobacco: Never Used  Substance Use Topics  . Alcohol use: No  . Drug use: No     Allergies   Penicillins   Review of Systems Review of Systems  Constitutional: Negative for appetite change and fatigue.  HENT: Negative for congestion, ear discharge and sinus pressure.   Eyes: Negative for discharge.  Respiratory: Negative for cough.   Cardiovascular: Negative for chest pain.  Gastrointestinal: Negative for abdominal pain and diarrhea.  Genitourinary: Negative for frequency and hematuria.  Musculoskeletal: Negative for back pain.  Skin: Negative for rash.  Neurological: Positive for dizziness. Negative for seizures and headaches.  Psychiatric/Behavioral: Negative for hallucinations.     Physical Exam Updated Vital Signs BP (!) 119/57   Pulse 73   Temp 98.1 F (36.7 C) (Oral)   Resp 12   Ht _0  (1.6 m)   Wt 124.7 kg   LMP 05/22/2015   SpO2 100%   BMI 48.71 kg/m   Physical Exam  Constitutional: She is oriented to person, place, and time. She appears well-developed.  HENT:  Head: Normocephalic.  Eyes: Conjunctivae and EOM are normal. No scleral icterus.    Neck: Neck supple. No thyromegaly present.  Cardiovascular: Normal rate and regular rhythm. Exam reveals no gallop and no friction rub.  No murmur heard. Pulmonary/Chest: No stridor. She has no wheezes. She has no rales. She exhibits no tenderness.  Abdominal: She exhibits no distension. There is no tenderness. There is  no rebound.  Musculoskeletal: Normal range of motion. She exhibits no edema.  Lymphadenopathy:    She has no cervical adenopathy.  Neurological: She is oriented to person, place, and time. She exhibits normal muscle tone. Coordination normal.  Skin: No rash noted. No erythema.  Psychiatric: She has a normal mood and affect. Her behavior is normal.     ED Treatments / Results  Labs (all labs ordered are listed, but only abnormal results are displayed) Labs Reviewed  COMPREHENSIVE METABOLIC PANEL - Abnormal; Notable for the following components:      Result Value   ALT 61 (*)    All other components within normal limits  CBC WITH DIFFERENTIAL/PLATELET  TSH    EKG None  Radiology No results found.  Procedures Procedures (including critical care time)  Medications Ordered in ED Medications - No data to display   Initial Impression / Assessment and Plan / ED Course  I have reviewed the triage vital signs and the nursing notes.  Pertinent labs & imaging results that were available during my care of the patient were reviewed by me and considered in my medical decision making (see chart for details).     Labs unremarkable including thyroid studies.  Blood pressure seems to be fine here in the emergency department.  She will follow-up with her doctor next week for repeat check.  Patient no longer has symptoms of dizziness  Final Clinical Impressions(s) / ED Diagnoses   Final diagnoses:  Essential hypertension    ED Discharge Orders    None       Milton Ferguson, MD 08/01/18 1525

## 2018-08-25 ENCOUNTER — Encounter (HOSPITAL_COMMUNITY): Payer: Self-pay | Admitting: Emergency Medicine

## 2018-08-25 ENCOUNTER — Emergency Department (HOSPITAL_COMMUNITY): Payer: BLUE CROSS/BLUE SHIELD

## 2018-08-25 ENCOUNTER — Emergency Department (HOSPITAL_COMMUNITY)
Admission: EM | Admit: 2018-08-25 | Discharge: 2018-08-25 | Disposition: A | Discharge status: Home / Self Care | Payer: BLUE CROSS/BLUE SHIELD | Attending: Emergency Medicine | Admitting: Emergency Medicine

## 2018-08-25 DIAGNOSIS — K5792 Diverticulitis of intestine, part unspecified, without perforation or abscess without bleeding: Secondary | ICD-10-CM

## 2018-08-25 DIAGNOSIS — I10 Essential (primary) hypertension: Secondary | ICD-10-CM | POA: Diagnosis not present

## 2018-08-25 DIAGNOSIS — R1084 Generalized abdominal pain: Secondary | ICD-10-CM | POA: Diagnosis not present

## 2018-08-25 DIAGNOSIS — Z87891 Personal history of nicotine dependence: Secondary | ICD-10-CM | POA: Diagnosis not present

## 2018-08-25 DIAGNOSIS — Z79899 Other long term (current) drug therapy: Secondary | ICD-10-CM | POA: Diagnosis not present

## 2018-08-25 DIAGNOSIS — R109 Unspecified abdominal pain: Secondary | ICD-10-CM

## 2018-08-25 LAB — CBC WITH DIFFERENTIAL/PLATELET
Abs Immature Granulocytes: 0.04 10*3/uL (ref 0.00–0.07)
Basophils Absolute: 0 10*3/uL (ref 0.0–0.1)
Basophils Relative: 0 %
Eosinophils Absolute: 0.1 10*3/uL (ref 0.0–0.5)
Eosinophils Relative: 1 %
HCT: 40.3 % (ref 36.0–46.0)
Hemoglobin: 12.7 g/dL (ref 12.0–15.0)
Immature Granulocytes: 0 %
Lymphocytes Relative: 17 %
Lymphs Abs: 1.7 10*3/uL (ref 0.7–4.0)
MCH: 30.7 pg (ref 26.0–34.0)
MCHC: 31.5 g/dL (ref 30.0–36.0)
MCV: 97.3 fL (ref 80.0–100.0)
Monocytes Absolute: 0.8 10*3/uL (ref 0.1–1.0)
Monocytes Relative: 8 %
Neutro Abs: 7.2 10*3/uL (ref 1.7–7.7)
Neutrophils Relative %: 74 %
Platelets: 288 10*3/uL (ref 150–400)
RBC: 4.14 MIL/uL (ref 3.87–5.11)
RDW: 13.2 % (ref 11.5–15.5)
WBC: 9.8 10*3/uL (ref 4.0–10.5)
nRBC: 0 % (ref 0.0–0.2)

## 2018-08-25 LAB — URINALYSIS, ROUTINE W REFLEX MICROSCOPIC
Bacteria, UA: NONE SEEN
Bilirubin Urine: NEGATIVE
Glucose, UA: NEGATIVE mg/dL
Hgb urine dipstick: NEGATIVE
Ketones, ur: NEGATIVE mg/dL
Nitrite: NEGATIVE
Protein, ur: NEGATIVE mg/dL
Specific Gravity, Urine: 1.015 (ref 1.005–1.030)
pH: 7 (ref 5.0–8.0)

## 2018-08-25 LAB — COMPREHENSIVE METABOLIC PANEL
ALT: 48 U/L — ABNORMAL HIGH (ref 0–44)
AST: 33 U/L (ref 15–41)
Albumin: 4.1 g/dL (ref 3.5–5.0)
Alkaline Phosphatase: 60 U/L (ref 38–126)
Anion gap: 8 (ref 5–15)
BUN: 11 mg/dL (ref 6–20)
CO2: 30 mmol/L (ref 22–32)
Calcium: 9.3 mg/dL (ref 8.9–10.3)
Chloride: 99 mmol/L (ref 98–111)
Creatinine, Ser: 0.69 mg/dL (ref 0.44–1.00)
GFR calc Af Amer: >60 mL/min (ref >60)
GFR calc non Af Amer: >60 mL/min (ref >60)
Glucose, Bld: 142 mg/dL — ABNORMAL HIGH (ref 70–99)
Potassium: 3.3 mmol/L — ABNORMAL LOW (ref 3.5–5.1)
Sodium: 137 mmol/L (ref 135–145)
Total Bilirubin: 0.7 mg/dL (ref 0.3–1.2)
Total Protein: 7.4 g/dL (ref 6.5–8.1)

## 2018-08-25 LAB — LIPASE, BLOOD: Lipase: 38 U/L (ref 11–51)

## 2018-08-25 LAB — PREGNANCY, URINE: Preg Test, Ur: NEGATIVE

## 2018-08-25 MED ORDER — PROMETHAZINE HCL 25 MG PO TABS: 25.0000 mg | ORAL_TABLET | Freq: Four times a day (QID) | ORAL | 0 refills | Status: DC | PRN

## 2018-08-25 MED ORDER — CIPROFLOXACIN HCL 500 MG PO TABS: 500.0000 mg | ORAL_TABLET | Freq: Two times a day (BID) | ORAL | 0 refills | Status: DC

## 2018-08-25 MED ORDER — CIPROFLOXACIN HCL 250 MG PO TABS
500.0000 mg | ORAL_TABLET | Freq: Once | ORAL | Status: AC
  Administered 2018-08-25: 500 mg via ORAL
  Filled 2018-08-25: qty 2

## 2018-08-25 MED ORDER — POTASSIUM CHLORIDE CRYS ER 20 MEQ PO TBCR
40.0000 meq | EXTENDED_RELEASE_TABLET | Freq: Once | ORAL | Status: AC
  Administered 2018-08-25: 40 meq via ORAL
  Filled 2018-08-25: qty 2

## 2018-08-25 MED ORDER — MORPHINE SULFATE (PF) 4 MG/ML IV SOLN
4.0000 mg | INTRAVENOUS | Status: DC | PRN
  Administered 2018-08-25: 4 mg via INTRAVENOUS
  Filled 2018-08-25: qty 1

## 2018-08-25 MED ORDER — ONDANSETRON HCL 4 MG/2ML IJ SOLN
4.0000 mg | INTRAMUSCULAR | Status: DC | PRN
  Administered 2018-08-25: 4 mg via INTRAVENOUS
  Filled 2018-08-25: qty 2

## 2018-08-25 MED ORDER — METRONIDAZOLE 500 MG PO TABS
500.0000 mg | ORAL_TABLET | Freq: Once | ORAL | Status: AC
  Administered 2018-08-25: 500 mg via ORAL
  Filled 2018-08-25: qty 1

## 2018-08-25 MED ORDER — IOPAMIDOL (ISOVUE-300) INJECTION 61%
100.0000 mL | Freq: Once | INTRAVENOUS | Status: AC | PRN
  Administered 2018-08-25: 100 mL via INTRAVENOUS

## 2018-08-25 MED ORDER — METRONIDAZOLE 500 MG PO TABS: 500.0000 mg | ORAL_TABLET | Freq: Three times a day (TID) | ORAL | 0 refills | Status: DC

## 2018-08-25 MED ORDER — FAMOTIDINE IN NACL 20-0.9 MG/50ML-% IV SOLN
20.0000 mg | Freq: Once | INTRAVENOUS | Status: AC
  Administered 2018-08-25: 20 mg via INTRAVENOUS
  Filled 2018-08-25: qty 50

## 2018-08-25 MED ORDER — HYDROCODONE-ACETAMINOPHEN 5-325 MG PO TABS: ORAL_TABLET | ORAL | 0 refills | Status: DC

## 2018-08-25 NOTE — Discharge Instructions (Signed)
Take the prescriptions as directed. Take in a fluids diet for the next few days, then advance to a bland diet, then to your regular diet, with increased fiber, slowly as you can tolerate it.  Call your regular medical doctor tomorrow to schedule a follow up appointment in the next 3 days. Call your GI doctor tomorrow to schedule a follow up appointment within the next week.  Return to the Emergency Department immediately if not improving (or even worsening) despite taking the medicines as prescribed, any black or bloody stool or vomit, if you develop a fever over "101," or for any other concerns.

## 2018-08-25 NOTE — ED Provider Notes (Signed)
[facility] EMERGENCY DEPARTMENT Provider Note   CSN: [id number] Arrival date & time: 08/25/18  7829     History   Chief Complaint Chief Complaint  Patient presents with  . Abdominal Pain    HPI Christine Mason is a 47 y.o. female.  HPI  Pt was seen at 0830.  Per pt, c/o gradual onset and persistence of constant left abd "pain" for the past 2 to 3 days.  Has been associated with nausea and several episodes of diarrhea. Describes the abd pain as "hurting," worsens with palpation of her abd and movement.  Denies vomiting, no fevers, no flank pain, no rash, no CP/SOB, no black or blood in stools, no dysuria/hematuria, no injury, no rash.      Past Medical History:  Diagnosis Date  . Chronic leg pain    right  . Chronic pain of left knee   . Depression   . Diverticulitis   . GERD (gastroesophageal reflux disease)   . Hypertension     Patient Active Problem List   Diagnosis Date Noted  . Genetic testing 06/03/2015  . 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 ACNE CYST REMOVAL    . CHOLECYSTECTOMY    . DILATION AND CURETTAGE OF UTERUS  1998  . KNEE SURGERY    . MASS EXCISION       OB History    Gravida  1   Para      Term      Preterm      AB  1   Living  0     SAB  1   TAB      Ectopic      Multiple      Live Births               Home Medications    Prior to Admission medications   Medication Sig Start Date End Date Taking? Authorizing Provider  ALPRAZolam Duanne Moron) 0.5 MG tablet Take 1 tablet by mouth at bedtime. 07/01/18   [provider]  atenolol (TENORMIN) 50 MG tablet Take 1 tablet (50 mg total) by mouth daily. 04/19/18   Nicholas Lose, MD  b complex vitamins tablet Take 1 tablet by mouth every morning.     [provider]  cloNIDine (CATAPRES) 0.2 MG tablet Take 0.2 mg by mouth 2 (two) times daily.    [provider]  docusate  sodium (COLACE) 100 MG capsule Take 100 mg by mouth 2 (two) times daily.    [provider]  gabapentin (NEURONTIN) 300 MG capsule Take 1 capsule (300 mg total) by mouth at bedtime. 04/20/17   Nicholas Lose, MD  HYDROcodone-acetaminophen (NORCO/VICODIN) 5-325 MG tablet Take 1 tablet by mouth 2 (two) times daily as needed. 06/28/18   [provider]  hydrOXYzine (VISTARIL) 25 MG capsule Take 1 capsule by mouth at bedtime as needed. 06/12/18   [provider]  losartan-hydrochlorothiazide (HYZAAR) 100-25 MG tablet Take 1 tablet by mouth daily. 05/06/18   [provider]  meloxicam (MOBIC) 15 MG tablet Take 1 tablet (15 mg total) by mouth daily. 04/19/18   Nicholas Lose, MD  methocarbamol (ROBAXIN-750) 750 MG tablet Take 1 tablet (750 mg total) by mouth at bedtime. Patient taking differently: Take 750-1,500 mg by mouth every 8 (eight) hours as needed.  04/20/17   Nicholas Lose, MD  Multiple Vitamins-Minerals (MULTIVITAMIN WITH MINERALS) tablet Take 1 tablet by  mouth daily.    [provider]  ondansetron (ZOFRAN) 4 MG tablet Take 1 tablet (4 mg total) by mouth every 8 (eight) hours as needed for nausea or vomiting. 04/26/17   Nicholas Lose, MD  pantoprazole (PROTONIX) 40 MG tablet Take 40 mg by mouth daily.      [provider]  traMADol (ULTRAM) 50 MG tablet Take 1 tablet (50 mg total) by mouth at bedtime. 04/20/17   Nicholas Lose, MD  traZODone (DESYREL) 100 MG tablet Take 100 mg by mouth at bedtime.    [provider]  venlafaxine XR (EFFEXOR-XR) 75 MG 24 hr capsule Take 225 mg by mouth daily.    [provider]    Family History Family History  Problem Relation Age of Onset  . Diabetes Mother   . Hypertension Mother   . Breast cancer Mother 35       dx again at 43; PALB2 and CHEK2 positive  . Diabetes Father   . Hypertension Father   . Coronary artery disease Father   . Hypertension Sister   . Breast cancer Sister         diagnosed in her 25's  . Thyroid cancer Maternal Uncle 46  . Lung cancer Maternal Grandmother   . Leukemia Maternal Grandfather 23       dx with hairy cell leukemia at 50; CLL at 67  . Lymphoma Maternal Grandfather 82       hodgkins lymphoma  . Brain cancer Paternal Grandmother   . Prostate cancer Paternal Grandfather   . Lung cancer Maternal Aunt 56  . Lymphoma Maternal Uncle 60       follicular lymphoma    Social History Social History   Tobacco Use  . Smoking status: Former Smoker    Packs/day: 0.50    Years: 5.00    Pack years: 2.50    Types: Cigarettes    Last attempt to quit: 09/15/2017    Years since quitting: 0.9  . Smokeless tobacco: Never Used  Substance Use Topics  . Alcohol use: No  . Drug use: No     Allergies   Penicillins   Review of Systems Review of Systems ROS: Statement: All systems negative except as marked or noted in the HPI; Constitutional: Negative for fever and chills. ; ; Eyes: Negative for eye pain, redness and discharge. ; ; ENMT: Negative for ear pain, hoarseness, nasal congestion, sinus pressure and sore throat. ; ; Cardiovascular: Negative for chest pain, palpitations, diaphoresis, dyspnea and peripheral edema. ; ; Respiratory: Negative for cough, wheezing and stridor. ; ; Gastrointestinal: +nausea, diarrhea, abd pain. Negative for vomiting, blood in stool, hematemesis, jaundice and rectal bleeding. . ; ; Genitourinary: Negative for dysuria, flank pain and hematuria. ; ; Musculoskeletal: Negative for back pain and neck pain. Negative for swelling and trauma.; ; Skin: Negative for pruritus, rash, abrasions, blisters, bruising and skin lesion.; ; Neuro: Negative for headache, lightheadedness and neck stiffness. Negative for weakness, altered level of consciousness, altered mental status, extremity weakness, paresthesias, involuntary movement, seizure and syncope.       Physical Exam Updated Vital Signs BP (!) 136/49 (BP Location: Right Arm)    Pulse 94   Temp 98.1 F (36.7 C) (Oral)   Resp 16   Ht _0  (1.6 m)   Wt 136.1 kg   LMP 05/22/2015   SpO2 92%   BMI 53.14 kg/m   Physical Exam 0835: Physical examination:  Nursing notes reviewed; Vital signs and O2  SAT reviewed;  Constitutional: Well developed, Well nourished, Well hydrated, In no acute distress; Head:  Normocephalic, atraumatic; Eyes: EOMI, PERRL, No scleral icterus; ENMT: Mouth and pharynx normal, Mucous membranes moist; Neck: Supple, Full range of motion, No lymphadenopathy; Cardiovascular: Regular rate and rhythm, No gallop; Respiratory: Breath sounds clear & equal bilaterally, No wheezes.  Speaking full sentences with ease, Normal respiratory effort/excursion; Chest: Nontender, Movement normal; Abdomen: Soft, +diffuse < left sided abd tenderness to palp. No rebound or guarding. Nondistended, Normal bowel sounds; Genitourinary: No CVA tenderness; Extremities: Peripheral pulses normal, No tenderness, No edema, No calf edema or asymmetry.; Neuro: AA&Ox3, Major CN grossly intact.  Speech clear. No gross focal motor or sensory deficits in extremities.; Skin: Color normal, Warm, Dry.   ED Treatments / Results  Labs (all labs ordered are listed, but only abnormal results are displayed)   EKG None  Radiology   Procedures Procedures (including critical care time)  Medications Ordered in ED Medications  famotidine (PEPCID) IVPB 20 mg premix (has no administration in time range)  ondansetron (ZOFRAN) injection 4 mg (has no administration in time range)  morphine 4 MG/ML injection 4 mg (has no administration in time range)     Initial Impression / Assessment and Plan / ED Course  I have reviewed the triage vital signs and the nursing notes.  Pertinent labs & imaging results that were available during my care of the patient were reviewed by me and considered in my medical decision making (see chart for details).  MDM Reviewed: previous chart, nursing note and  vitals Reviewed previous: labs Interpretation: labs, x-ray and CT scan   Results for orders placed or performed during the hospital encounter of 08/25/18  Pregnancy, urine  Result Value Ref Range   Preg Test, Ur NEGATIVE NEGATIVE  Urinalysis, Routine w reflex microscopic  Result Value Ref Range   Color, Urine AMBER (A) YELLOW   APPearance HAZY (A) CLEAR   Specific Gravity, Urine 1.015 1.005 - 1.030   pH 7.0 5.0 - 8.0   Glucose, UA NEGATIVE NEGATIVE mg/dL   Hgb urine dipstick NEGATIVE NEGATIVE   Bilirubin Urine NEGATIVE NEGATIVE   Ketones, ur NEGATIVE NEGATIVE mg/dL   Protein, ur NEGATIVE NEGATIVE mg/dL   Nitrite NEGATIVE NEGATIVE   Leukocytes, UA TRACE (A) NEGATIVE   RBC / HPF 0-5 0 - 5 RBC/hpf   WBC, UA 0-5 0 - 5 WBC/hpf   Bacteria, UA NONE SEEN NONE SEEN   Squamous Epithelial / LPF 0-5 0 - 5   Mucus PRESENT   Comprehensive metabolic panel  Result Value Ref Range   Sodium 137 135 - 145 mmol/L   Potassium 3.3 (L) 3.5 - 5.1 mmol/L   Chloride 99 98 - 111 mmol/L   CO2 30 22 - 32 mmol/L   Glucose, Bld 142 (H) 70 - 99 mg/dL   BUN 11 6 - 20 mg/dL   Creatinine, Ser 0.69 0.44 - 1.00 mg/dL   Calcium 9.3 8.9 - 10.3 mg/dL   Total Protein 7.4 6.5 - 8.1 g/dL   Albumin 4.1 3.5 - 5.0 g/dL   AST 33 15 - 41 U/L   ALT 48 (H) 0 - 44 U/L   Alkaline Phosphatase 60 38 - 126 U/L   Total Bilirubin 0.7 0.3 - 1.2 mg/dL   GFR calc non Af Amer >60 >60 mL/min   GFR calc Af Amer >60 >60 mL/min   Anion gap 8 5 - 15  Lipase, blood  Result Value Ref Range  Lipase 38 11 - 51 U/L  CBC with Differential  Result Value Ref Range   WBC 9.8 4.0 - 10.5 K/uL   RBC 4.14 3.87 - 5.11 MIL/uL   Hemoglobin 12.7 12.0 - 15.0 g/dL   HCT 40.3 36.0 - 46.0 %   MCV 97.3 80.0 - 100.0 fL   MCH 30.7 26.0 - 34.0 pg   MCHC 31.5 30.0 - 36.0 g/dL   RDW 13.2 11.5 - 15.5 %   Platelets 288 150 - 400 K/uL   nRBC 0.0 0.0 - 0.2 %   Neutrophils Relative % 74 %   Neutro Abs 7.2 1.7 - 7.7 K/uL   Lymphocytes Relative 17 %     Lymphs Abs 1.7 0.7 - 4.0 K/uL   Monocytes Relative 8 %   Monocytes Absolute 0.8 0.1 - 1.0 K/uL   Eosinophils Relative 1 %   Eosinophils Absolute 0.1 0.0 - 0.5 K/uL   Basophils Relative 0 %   Basophils Absolute 0.0 0.0 - 0.1 K/uL   Immature Granulocytes 0 %   Abs Immature Granulocytes 0.04 0.00 - 0.07 K/uL   Dg Chest 2 View Result Date: 08/25/2018 CLINICAL DATA:  Left abdominal pain.  Increasing in severity. EXAM: CHEST - 2 VIEW COMPARISON:  CT chest 02/01/2011 FINDINGS: The heart size and mediastinal contours are within normal limits. Both lungs are clear. The visualized skeletal structures are unremarkable. IMPRESSION: No active cardiopulmonary disease. Electronically Signed   By: Kathreen Devoid   On: 08/25/2018 10:12   Ct Abdomen Pelvis W Contrast Result Date: 08/25/2018 CLINICAL DATA:  Left-sided pain for 2 days.  Nausea.  Diarrhea. EXAM: CT ABDOMEN AND PELVIS WITH CONTRAST TECHNIQUE: Multidetector CT imaging of the abdomen and pelvis was performed using the standard protocol following bolus administration of intravenous contrast. CONTRAST:  168m ISOVUE-300 IOPAMIDOL (ISOVUE-300) INJECTION 61% COMPARISON:  06/27/2015 FINDINGS: Lower chest: Clear lung bases. Normal heart size without pericardial or pleural effusion. Right hemidiaphragm elevation. Tiny hiatal hernia. Hepatobiliary: Hepatomegaly, on the order of 20 cm craniocaudal. Probable hepatic steatosis. Cholecystectomy, without biliary ductal dilatation. Pancreas: Normal, without mass or ductal dilatation. Spleen: Normal in size, without focal abnormality. Adrenals/Urinary Tract: Normal adrenal glands. Bilateral renal cysts, maximally 2.5 cm. No hydronephrosis. Normal urinary bladder. Stomach/Bowel: Normal remainder of the stomach. Large amount of stool in the rectum including at 7.0 cm. Scattered colonic diverticula. Inflammation surrounding the distal descending colon is moderate, including on image 62/2. No free perforation or pericolonic  fluid collection. Normal terminal ileum and appendix. Normal small bowel. Vascular/Lymphatic: Normal caliber of the aorta and branch vessels. Small retroperitoneal nodes are not pathologic by size criteria. No pelvic sidewall adenopathy. Reproductive: Suspect a left posterior uterine body/fundal fibroid on the order of 2.6 cm. No adnexal mass. Other: No significant free fluid. At least 1 fat containing ventral abdominal wall hernia. Musculoskeletal: Left iliac sclerotic lesion is likely a bone island. Mild convex left lumbar spine curvature. IMPRESSION: 1. Non complicated descending diverticulitis. 2. Large amount of stool in the rectum may represent constipation or even fecal impaction. 3. Hepatomegaly and probable hepatic steatosis. 4.  Tiny hiatal hernia. Electronically Signed   By: KAbigail MiyamotoM.D.   On: 08/25/2018 10:47    1120:  Potassium repleted PO. PO abx 1st doses given in ED for CT findings. Workup otherwise reassuring. Pt has tol PO well while in the ED without N/V.  No stooling while in the ED. Remains afebrile, resps easy, VSS, NAD. Offered admission, but pt states she feels  better and wants to go home. Return precautions given. Dx and testing d/w pt and family.  Questions answered.  Verb understanding, agreeable to d/c home with outpt f/u.     Final Clinical Impressions(s) / ED Diagnoses   Final diagnoses:  None    ED Discharge Orders    None       Francine Graven, DO 08/30/18 1345

## 2018-08-25 NOTE — ED Triage Notes (Signed)
Pt states her left side has been hurting for about 2 days increasing in severity.  Hurts to bend, sit, or walk.  Denies v/d but positive for nausea.  Denies urinary s/s.

## 2018-09-13 ENCOUNTER — Emergency Department (HOSPITAL_COMMUNITY): Payer: BLUE CROSS/BLUE SHIELD

## 2018-09-13 ENCOUNTER — Other Ambulatory Visit: Payer: Self-pay

## 2018-09-13 ENCOUNTER — Emergency Department (HOSPITAL_COMMUNITY)
Admission: EM | Admit: 2018-09-13 | Discharge: 2018-09-13 | Disposition: A | Discharge status: Home / Self Care | Payer: BLUE CROSS/BLUE SHIELD | Attending: Emergency Medicine | Admitting: Emergency Medicine

## 2018-09-13 ENCOUNTER — Encounter (HOSPITAL_COMMUNITY): Payer: Self-pay

## 2018-09-13 DIAGNOSIS — I10 Essential (primary) hypertension: Secondary | ICD-10-CM | POA: Diagnosis not present

## 2018-09-13 DIAGNOSIS — Z87891 Personal history of nicotine dependence: Secondary | ICD-10-CM | POA: Insufficient documentation

## 2018-09-13 DIAGNOSIS — R0789 Other chest pain: Secondary | ICD-10-CM | POA: Diagnosis not present

## 2018-09-13 DIAGNOSIS — R1011 Right upper quadrant pain: Secondary | ICD-10-CM | POA: Diagnosis present

## 2018-09-13 DIAGNOSIS — Z79899 Other long term (current) drug therapy: Secondary | ICD-10-CM | POA: Insufficient documentation

## 2018-09-13 DIAGNOSIS — R52 Pain, unspecified: Secondary | ICD-10-CM

## 2018-09-13 LAB — URINALYSIS, ROUTINE W REFLEX MICROSCOPIC
Bilirubin Urine: NEGATIVE
Glucose, UA: NEGATIVE mg/dL
Hgb urine dipstick: NEGATIVE
Ketones, ur: NEGATIVE mg/dL
Leukocytes, UA: NEGATIVE
Nitrite: NEGATIVE
Protein, ur: NEGATIVE mg/dL
Specific Gravity, Urine: 1.014 (ref 1.005–1.030)
pH: 6 (ref 5.0–8.0)

## 2018-09-13 LAB — CBC WITH DIFFERENTIAL/PLATELET
Abs Immature Granulocytes: 0.02 10*3/uL (ref 0.00–0.07)
Basophils Absolute: 0 10*3/uL (ref 0.0–0.1)
Basophils Relative: 1 %
Eosinophils Absolute: 0.1 10*3/uL (ref 0.0–0.5)
Eosinophils Relative: 2 %
HCT: 41.3 % (ref 36.0–46.0)
Hemoglobin: 13.1 g/dL (ref 12.0–15.0)
Immature Granulocytes: 0 %
Lymphocytes Relative: 35 %
Lymphs Abs: 2.2 10*3/uL (ref 0.7–4.0)
MCH: 31 pg (ref 26.0–34.0)
MCHC: 31.7 g/dL (ref 30.0–36.0)
MCV: 97.6 fL (ref 80.0–100.0)
Monocytes Absolute: 0.7 10*3/uL (ref 0.1–1.0)
Monocytes Relative: 11 %
Neutro Abs: 3.2 10*3/uL (ref 1.7–7.7)
Neutrophils Relative %: 51 %
Platelets: 336 10*3/uL (ref 150–400)
RBC: 4.23 MIL/uL (ref 3.87–5.11)
RDW: 13.2 % (ref 11.5–15.5)
WBC: 6.2 10*3/uL (ref 4.0–10.5)
nRBC: 0 % (ref 0.0–0.2)

## 2018-09-13 LAB — BASIC METABOLIC PANEL
Anion gap: 8 (ref 5–15)
BUN: 12 mg/dL (ref 6–20)
CO2: 29 mmol/L (ref 22–32)
Calcium: 9.6 mg/dL (ref 8.9–10.3)
Chloride: 102 mmol/L (ref 98–111)
Creatinine, Ser: 0.67 mg/dL (ref 0.44–1.00)
GFR calc Af Amer: >60 mL/min (ref >60)
GFR calc non Af Amer: >60 mL/min (ref >60)
Glucose, Bld: 101 mg/dL — ABNORMAL HIGH (ref 70–99)
Potassium: 3.7 mmol/L (ref 3.5–5.1)
Sodium: 139 mmol/L (ref 135–145)

## 2018-09-13 LAB — D-DIMER, QUANTITATIVE: D-Dimer, Quant: 0.99 ug/mL-FEU — ABNORMAL HIGH (ref 0.00–0.50)

## 2018-09-13 LAB — TROPONIN I: Troponin I: <0.03 ng/mL (ref <0.03)

## 2018-09-13 MED ORDER — IOPAMIDOL (ISOVUE-370) INJECTION 76%
100.0000 mL | Freq: Once | INTRAVENOUS | Status: AC | PRN
  Administered 2018-09-13: 100 mL via INTRAVENOUS

## 2018-09-13 MED ORDER — OXYCODONE-ACETAMINOPHEN 5-325 MG PO TABS: 1.0000 | ORAL_TABLET | ORAL | 0 refills | Status: DC | PRN

## 2018-09-13 MED ORDER — OXYCODONE-ACETAMINOPHEN 5-325 MG PO TABS
1.0000 | ORAL_TABLET | Freq: Once | ORAL | Status: AC
  Administered 2018-09-13: 1 via ORAL
  Filled 2018-09-13: qty 1

## 2018-09-13 NOTE — ED Triage Notes (Signed)
Pt reports pain to right lateral side/ flank pain for 3 days. Pt reports nausea. No blood in urine

## 2018-09-13 NOTE — ED Notes (Signed)
Please note VS 1208

## 2018-09-13 NOTE — ED Notes (Signed)
Pt reports flank/back pain for the last 3 days  Went to physician's office "but they were so full I came here and they wouldn't order an xray without seeing me"  Asked due to chronic pain if took pain meds or has run out  "I haven't taken anything- I took tramadol last night but it doesn't help"  Denies other pain meds

## 2018-09-13 NOTE — Discharge Instructions (Addendum)
Alternate ice and heat to your chest.  Follow-up with your doctor for recheck.

## 2018-09-13 NOTE — ED Notes (Signed)
To rad 

## 2018-09-16 NOTE — ED Provider Notes (Signed)
[facility] EMERGENCY DEPARTMENT Provider Note   CSN: [id number] Arrival date & time: 09/13/18  1016     History   Chief Complaint Chief Complaint  Patient presents with  . Flank Pain    HPI Christine Mason is a 47 y.o. female.  HPI   Christine Mason is a 47 y.o. female who presents to the Emergency Department complaining of right lateral rib pain and pain with deep breathing.  Pain has been present for 3 days and associated with nausea.  She describes the pain as constant, but worse with movement and deep breathing.  No known trauma.  Pain was gradual in onset.  She takes tramadol for chronic knee and leg pain, but states that it is not helping her chest pain.  She does have some shortness of breath associated with the pain.  She denies fever, dysuria, chills, recent cough or illness.  No recent travel or sedentary lifestyle.  She does not take birth control.  No history of kidney stone, PE or DVT.   Past Medical History:  Diagnosis Date  . Chronic leg pain    right  . Chronic pain of left knee   . Depression   . Diverticulitis   . GERD (gastroesophageal reflux disease)   . Hypertension     Patient Active Problem List   Diagnosis Date Noted  . Genetic testing 06/03/2015  . 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 ACNE CYST REMOVAL    . CHOLECYSTECTOMY    . DILATION AND CURETTAGE OF UTERUS  1998  . KNEE SURGERY    . MASS EXCISION       OB History    Gravida  1   Para      Term      Preterm      AB  1   Living  0     SAB  1   TAB      Ectopic      Multiple      Live Births               Home Medications    Prior to Admission medications   Medication Sig Start Date End Date Taking? Authorizing Provider  ALPRAZolam Duanne Moron) 0.5 MG tablet Take 1 tablet by mouth at bedtime. 07/01/18  Yes [provider]  atenolol (TENORMIN) 50 MG tablet Take 1  tablet (50 mg total) by mouth daily. 04/19/18  Yes Nicholas Lose, MD  cloNIDine (CATAPRES) 0.2 MG tablet Take 0.2 mg by mouth 2 (two) times daily.   Yes [provider]  gabapentin (NEURONTIN) 300 MG capsule Take 1 capsule (300 mg total) by mouth at bedtime. Patient taking differently: Take 300 mg by mouth 3 (three) times daily.  04/20/17  Yes Nicholas Lose, MD  hydrOXYzine (VISTARIL) 25 MG capsule Take 1 capsule by mouth every 8 (eight) hours as needed.  06/12/18  Yes [provider]  losartan-hydrochlorothiazide (HYZAAR) 100-25 MG tablet Take 1 tablet by mouth daily.   Yes [provider]  meloxicam (MOBIC) 15 MG tablet Take 1 tablet (15 mg total) by mouth daily. 04/19/18  Yes Nicholas Lose, MD  methocarbamol (ROBAXIN-750) 750 MG tablet Take 1 tablet (750 mg total) by mouth at bedtime. Patient taking differently: Take 750-1,500 mg by mouth every 8 (eight) hours as needed. Take 1-2 tablets up to 3 times daily. 04/20/17  Yes Nicholas Lose, MD  Multiple  Vitamins-Minerals (MULTIVITAMIN WITH MINERALS) tablet Take 1 tablet by mouth daily.   Yes [provider]  pantoprazole (PROTONIX) 40 MG tablet Take 40 mg by mouth daily.     Yes [provider]  traMADol (ULTRAM) 50 MG tablet Take 1 tablet (50 mg total) by mouth at bedtime. Patient taking differently: Take 50 mg by mouth every 6 (six) hours as needed.  04/20/17  Yes Nicholas Lose, MD  traZODone (DESYREL) 100 MG tablet Take 100 mg by mouth at bedtime.   Yes [provider]  venlafaxine XR (EFFEXOR-XR) 75 MG 24 hr capsule Take 225 mg by mouth daily.   Yes [provider]  ciprofloxacin (CIPRO) 500 MG tablet Take 1 tablet (500 mg total) by mouth 2 (two) times daily. Patient not taking: Reported on 09/13/2018 08/25/18   Francine Graven, DO  metroNIDAZOLE (FLAGYL) 500 MG tablet Take 1 tablet (500 mg total) by mouth 3 (three) times daily. Patient not taking: Reported on 09/13/2018 08/25/18   Francine Graven, DO  ondansetron (ZOFRAN) 4 MG tablet Take 1 tablet (4 mg total) by mouth every 8 (eight) hours as needed for nausea or vomiting. Patient not taking: Reported on 09/13/2018 04/26/17   Nicholas Lose, MD  oxyCODONE-acetaminophen (PERCOCET/ROXICET) 5-325 MG tablet Take 1 tablet by mouth every 4 (four) hours as needed. 09/13/18   Jiayi Lengacher, PA-C  promethazine (PHENERGAN) 25 MG tablet Take 1 tablet (25 mg total) by mouth every 6 (six) hours as needed for nausea or vomiting. Patient not taking: Reported on 09/13/2018 08/25/18   Francine Graven, DO    Family History Family History  Problem Relation Age of Onset  . Diabetes Mother   . Hypertension Mother   . Breast cancer Mother 36       dx again at 49; PALB2 and CHEK2 positive  . Diabetes Father   . Hypertension Father   . Coronary artery disease Father   . Hypertension Sister   . Breast cancer Sister        diagnosed in her 76's  . Thyroid cancer Maternal Uncle 46  . Lung cancer Maternal Grandmother   . Leukemia Maternal Grandfather 39       dx with hairy cell leukemia at 23; CLL at 36  . Lymphoma Maternal Grandfather 82       hodgkins lymphoma  . Brain cancer Paternal Grandmother   . Prostate cancer Paternal Grandfather   . Lung cancer Maternal Aunt 71  . Lymphoma Maternal Uncle 60       follicular lymphoma    Social History Social History   Tobacco Use  . Smoking status: Former Smoker    Packs/day: 0.50    Years: 5.00    Pack years: 2.50    Types: Cigarettes    Last attempt to quit: 09/15/2017    Years since quitting: 1.0  . Smokeless tobacco: Never Used  Substance Use Topics  . Alcohol use: No  . Drug use: No     Allergies   Penicillins   Review of Systems Review of Systems  Constitutional: Negative for appetite change, chills and fever.  HENT: Negative for congestion and sore throat.   Respiratory: Positive for shortness of breath. Negative for cough.   Cardiovascular: Positive for chest pain.  Negative for palpitations and leg swelling.  Gastrointestinal: Positive for nausea. Negative for abdominal pain, diarrhea and vomiting.  Genitourinary: Negative for decreased urine volume, dysuria, flank pain and hematuria.  Musculoskeletal: Negative for neck pain.  Skin: Negative  for rash.  Neurological: Negative for dizziness, weakness and numbness.     Physical Exam Updated Vital Signs BP 130/70 (BP Location: Right Arm)   Pulse 85   Temp 97.6 F (36.4 C) (Oral)   Resp 15   Ht _0  (1.6 m)   Wt 136.1 kg   LMP 05/22/2015   SpO2 96%   BMI 53.14 kg/m   Physical Exam Vitals signs and nursing note reviewed.  Constitutional:      General: She is not in acute distress.    Appearance: She is obese.     Comments: Patient is uncomfortable appearing, but nontoxic.  HENT:     Head: Atraumatic.     Mouth/Throat:     Mouth: Mucous membranes are moist.     Pharynx: Oropharynx is clear. No posterior oropharyngeal erythema.  Neck:     Musculoskeletal: Normal range of motion. No muscular tenderness.  Cardiovascular:     Rate and Rhythm: Normal rate and regular rhythm.     Pulses: Normal pulses.  Pulmonary:     Effort: Pulmonary effort is normal.     Breath sounds: Normal breath sounds. No wheezing or rales.  Chest:     Chest wall: Tenderness (Tender to palpation of the right upper chest wall.  No bony deformities or crepitus.) present.  Abdominal:     General: There is no distension.     Palpations: Abdomen is soft.     Tenderness: There is no abdominal tenderness. There is no right CVA tenderness or guarding.  Musculoskeletal: Normal range of motion.        General: No swelling or tenderness.  Lymphadenopathy:     Cervical: No cervical adenopathy.  Skin:    General: Skin is warm.     Findings: No rash.  Neurological:     General: No focal deficit present.     Mental Status: She is alert.     Sensory: No sensory deficit.     Motor: No weakness.      ED Treatments /  Results  Labs (all labs ordered are listed, but only abnormal results are displayed) Labs Reviewed  D-DIMER, QUANTITATIVE (NOT AT [facility]) - Abnormal; Notable for the following components:      Result Value   D-Dimer, Quant 0.99 (*)    All other components within normal limits  BASIC METABOLIC PANEL - Abnormal; Notable for the following components:   Glucose, Bld 101 (*)    All other components within normal limits  URINALYSIS, ROUTINE W REFLEX MICROSCOPIC  TROPONIN I  CBC WITH DIFFERENTIAL/PLATELET    EKG EKG Interpretation  Date/Time:  Friday September 13 2018 13:34:39 EST Ventricular Rate:  82 PR Interval:  166 QRS Duration: 66 QT Interval:  358 QTC Calculation: 418 R Axis:   54 Text Interpretation:  Normal sinus rhythm Normal ECG since last tracing no significant change Confirmed by Daleen Bo [phone]) on 09/13/2018 1:45:46 PM   Radiology Dg Chest 2 View  Result Date: 09/13/2018 CLINICAL DATA:  Acute onset of RIGHT axillary chest pain and shortness of breath. EXAM: CHEST - 2 VIEW COMPARISON:  08/25/2018 and earlier, including CT chest 02/01/2011. FINDINGS: Cardiomediastinal silhouette unremarkable and unchanged. Subpleural opacity in the RIGHT hemithorax, new since prior examinations. Lungs clear. Bronchovascular markings normal. Pulmonary vascularity normal. No pneumothorax. Exaggeration of the usual thoracic kyphosis resulting in low lung volumes. Stable chronic elevation the RIGHT hemidiaphragm. Degenerative changes and DISH involving the thoracic spine. IMPRESSION: 1. No acute cardiopulmonary disease. 2. Subpleural  opacity in the RIGHT hemithorax, new since prior examinations, possibly benign subpleural fat. A subpleural chest wall mass is felt less likely. Electronically Signed   By: Evangeline Dakin M.D.   On: 09/13/2018 11:55    Dg Ribs Unilateral Right  Result Date: 09/13/2018 CLINICAL DATA:  Acute onset of RIGHT axillary chest pain and shortness of breath. EXAM:  RIGHT RIBS - 2 VIEW COMPARISON:  No prior RIGHT rib imaging. Multiple prior chest x-rays. Bone window images from prior CT chest 02/01/2011. FINDINGS: No evidence of acute, subacute or healed RIGHT rib fractures. No intrinsic osseous abnormalities. The subpleural opacity in the RIGHT LATERAL chest wall is noted as described on the concurrent chest x-ray. IMPRESSION: No osseous abnormality. Electronically Signed   By: Evangeline Dakin M.D.   On: 09/13/2018 11:57   Ct Angio Chest Pe W And/or Wo Contrast  Result Date: 09/13/2018 CLINICAL DATA:  Right lateral and flank pain for 3 days. EXAM: CT ANGIOGRAPHY CHEST WITH CONTRAST TECHNIQUE: Multidetector CT imaging of the chest was performed using the standard protocol during bolus administration of intravenous contrast. Multiplanar CT image reconstructions and MIPs were obtained to evaluate the vascular anatomy. CONTRAST:  169m ISOVUE-370 IOPAMIDOL (ISOVUE-370) INJECTION 76% COMPARISON:  02/01/2011 CT, CXR 09/13/2018 FINDINGS: Cardiovascular: Normal heart size without pericardial effusion. Nonaneurysmal thoracic aorta. No dissection. No large central pulmonary embolus though contrast is preferentially seen within the aorta at time of imaging. Common branch point of the right brachiocephalic left common carotid arteries. Patent great vessels. Mediastinum/Nodes: No enlarged mediastinal, hilar, or axillary lymph nodes. Thyroid gland, trachea, and esophagus demonstrate no significant findings. Lungs/Pleura: Minimal scarring at the right lung apex. Mild dependent atelectasis bilaterally. No effusion or pneumothorax. No dominant mass. Upper Abdomen: Cholecystectomy. Partially included interpolar right renal cyst measuring 2 cm. No acute abnormality within the upper abdomen. Musculoskeletal: Subacute to chronic appearing right lateral seventh rib fracture. Bone island of the anterior right eighth rib. Degenerative change along the thoracic spine. No acute nor suspicious  osseous abnormalities. Review of the MIP images confirms the above findings. IMPRESSION: 1. No acute pulmonary embolus, aortic aneurysm or dissection. 2. No active pulmonary disease. 3. Subacute to chronic appearing right lateral seventh rib fracture. Electronically Signed   By: DAshley RoyaltyM.D.   On: 09/13/2018 15:44      Procedures Procedures (including critical care time)  Medications Ordered in ED Medications  oxyCODONE-acetaminophen (PERCOCET/ROXICET) 5-325 MG per tablet 1 tablet (1 tablet Oral Given 09/13/18 1244)  iopamidol (ISOVUE-370) 76 % injection 100 mL (100 mLs Intravenous Contrast Given 09/13/18 1530)     Initial Impression / Assessment and Plan / ED Course  I have reviewed the triage vital signs and the nursing notes.  Pertinent labs & imaging results that were available during my care of the patient were reviewed by me and considered in my medical decision making (see chart for details).     Patient with pleuritic right chest wall pain.  Vitals are stable.  Urinalysis does not show hematuria she does not have CVA tenderness.  Doubt ACS.  I will obtain labs and chest x-ray, including d-dimer  D-dimer elevated, pain slightly improved after oxycodone.  Will obtain CT angio chest to rule out PE.  Work-up reassuring, CT angio of the chest negative for PE.  No lower extremity erythema or edema.  I have discussed CT results with the patient, she does not recall a previous rib fracture.  Fracture is felt to be subacute.  I will prescribe  short course of Percocet and she agrees to discontinue her tramadol while taking the Percocet.  I feel she is appropriate for discharge home and she agrees to close outpatient follow-up.  Return precautions were discussed.  Final Clinical Impressions(s) / ED Diagnoses   Final diagnoses:  Chest wall pain    ED Discharge Orders         Ordered    oxyCODONE-acetaminophen (PERCOCET/ROXICET) 5-325 MG tablet  Every 4 hours PRN     09/13/18 1637            Kem Parkinson, PA-C 09/16/18 1345    Fredia Sorrow, MD 09/18/18 1635

## 2019-01-08 ENCOUNTER — Other Ambulatory Visit: Payer: Self-pay

## 2019-01-08 ENCOUNTER — Ambulatory Visit (INDEPENDENT_AMBULATORY_CARE_PROVIDER_SITE_OTHER): Payer: BLUE CROSS/BLUE SHIELD | Admitting: Allergy & Immunology

## 2019-01-08 ENCOUNTER — Encounter: Payer: Self-pay | Admitting: Allergy & Immunology

## 2019-01-08 VITALS — BP 126/88 | HR 73 | Temp 96.5°F | Resp 18 | Ht 63.0 in | Wt 315.0 lb

## 2019-01-08 DIAGNOSIS — R1310 Dysphagia, unspecified: Secondary | ICD-10-CM

## 2019-01-08 DIAGNOSIS — R6889 Other general symptoms and signs: Secondary | ICD-10-CM | POA: Diagnosis not present

## 2019-01-08 DIAGNOSIS — L299 Pruritus, unspecified: Secondary | ICD-10-CM

## 2019-01-08 DIAGNOSIS — R0989 Other specified symptoms and signs involving the circulatory and respiratory systems: Secondary | ICD-10-CM
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
## 2019-01-08 NOTE — Patient Instructions (Addendum)
1. Itching - Unfortunately, we could not do testing since you took Xyzal last night. - Stop the Xyzal today and come back next Wednesday April 29th at 8am for skin testing. - We will be able to figure out additional changes once we have the skin testing. - Start the prednisone pack provided to help with the itching since we are taking off the antihistamines: 10mg  (one tablet) twice daily until the next visit. - Avoidance measures for dust mite and cockroach provided since these were positive on blood work.  2. Throat clearing - Start Nasacort one spray per nostril daily.  - This might help decrease postnasal drip, which might decrease the throat clearing.   3. Dysphagia (difficulty swallowing) - Consider Gastroenterologist referral in the future. - Continue Protonix 1-2 times daily.  4. Return in about 1 week (around 01/15/2019) for SKIN TESTING (8AM) .    Please inform us of any Emergency Department visits, hospitalizations, or changes in symptoms. Call us before going to the ED for breathing or allergy symptoms since we might be able to fit you in for a sick visit. Feel free to contact us anytime with any questions, problems, or concerns.  It was a pleasure to meet you today!  Websites that have reliable patient information: 1. American Academy of Asthma, Allergy, and Immunology: www.aaaai.org 2. Food Allergy Research and Education (FARE): foodallergy.org 3. Mothers of Asthmatics: http://www.asthmacommunitynetwork.org 4. American College of Allergy, Asthma, and Immunology: www.acaai.org  "Like" [place] on Facebook and Instagram for our latest updates!      Make sure you are registered to vote! If you have moved or changed any of your contact information, you will need to get this updated before voting!    Voter ID laws are NOT going into effect for the General Election in November 2020! DO NOT let this stop you from exercising your right to vote!     Control of House Dust Mite  Allergen    House dust mites play a major role in allergic asthma and rhinitis.  They occur in environments with high humidity wherever human skin, the food for dust mites is found. High levels have been detected in dust obtained from mattresses, pillows, carpets, upholstered furniture, bed covers, clothes and soft toys.  The principal allergen of the house dust mite is found in its feces.  A gram of dust may contain 1,000 mites and 250,000 fecal particles.  Mite antigen is easily measured in the air during house cleaning activities.    1. Encase mattresses, including the box spring, and pillow, in an air tight cover.  Seal the zipper end of the encased mattresses with wide adhesive tape. 2. Wash the bedding in water of 130 degrees Farenheit weekly.  Avoid cotton comforters/quilts and flannel bedding: the most ideal bed covering is the dacron comforter. 3. Remove all upholstered furniture from the bedroom. 4. Remove carpets, carpet padding, rugs, and non-washable window drapes from the bedroom.  Wash drapes weekly or use plastic window coverings. 5. Remove all non-washable stuffed toys from the bedroom.  Wash stuffed toys weekly. 6. Have the room cleaned frequently with a vacuum cleaner and a damp dust-mop.  The patient should not be in a room which is being cleaned and should wait 1 hour after cleaning before going into the room. 7. Close and seal all heating outlets in the bedroom.  Otherwise, the room will become filled with dust-laden air.  An electric heater can be used to heat the room. 8. Reduce  indoor humidity to less than 50%.  Do not use a humidifier.  Control of Cockroach Allergen  Cockroach allergen has been identified as an important cause of acute attacks of asthma, especially in urban settings.  There are fifty-five species of cockroach that exist in the [place], however only three, the [place], [place] species produce allergen that can affect patients with Asthma.   Allergens can be obtained from fecal particles, egg casings and secretions from cockroaches.    1. Remove food sources. 2. Reduce access to water. 3. Seal access and entry points. 4. Spray runways with 0.5-1% Diazinon or Chlorpyrifos 5. Blow boric acid power under stoves and refrigerator. 6. Place bait stations (hydramethylnon) at feeding sites.

## 2019-01-15 ENCOUNTER — Ambulatory Visit (INDEPENDENT_AMBULATORY_CARE_PROVIDER_SITE_OTHER): Payer: BLUE CROSS/BLUE SHIELD | Admitting: Allergy & Immunology

## 2019-01-15 ENCOUNTER — Encounter: Payer: Self-pay | Admitting: Allergy & Immunology

## 2019-01-15 ENCOUNTER — Other Ambulatory Visit: Payer: Self-pay

## 2019-01-15 VITALS — BP 134/92 | HR 65 | Resp 20 | Ht 62.0 in | Wt 311.0 lb

## 2019-01-15 DIAGNOSIS — J3089 Other allergic rhinitis: Secondary | ICD-10-CM | POA: Diagnosis not present

## 2019-01-15 DIAGNOSIS — R0989 Other specified symptoms and signs involving the circulatory and respiratory systems: Secondary | ICD-10-CM

## 2019-01-15 DIAGNOSIS — R6889 Other general symptoms and signs: Secondary | ICD-10-CM

## 2019-01-15 DIAGNOSIS — J302 Other seasonal allergic rhinitis: Secondary | ICD-10-CM

## 2019-01-15 DIAGNOSIS — L299 Pruritus, unspecified: Secondary | ICD-10-CM | POA: Diagnosis not present

## 2019-01-15 MED ORDER — EPINEPHRINE 0.3 MG/0.3ML IJ SOAJ: 0.3000 mg | Freq: Once | INTRAMUSCULAR | 1 refills | Status: AC

## 2019-01-15 MED ORDER — MONTELUKAST SODIUM 10 MG PO TABS: 10.0000 mg | ORAL_TABLET | Freq: Every day | ORAL | 5 refills | Status: DC

## 2019-01-15 NOTE — Addendum Note (Signed)
Addended by: Valentina Shaggy on: 01/15/2019 11:59 AM   Modules accepted: Orders

## 2019-01-15 NOTE — Progress Notes (Deleted)
Entered in error

## 2019-01-15 NOTE — Progress Notes (Signed)
VIALS EXP 01-15-2020 

## 2019-01-15 NOTE — Progress Notes (Addendum)
FOLLOW UP  Date of Service/Encounter:  01/15/19   Assessment:   Itching  Perennial and seasonal allergic rhinitis (grasses, ragweed, weeds, indoor molds, dust mites and cat)  Dysphagia - ? eosinophilic esophagitis   [name] presents for skin testing.  She does have sensitizations on intradermal testing today, the most prominent of which is dust mite.  We did discuss options for treatment, including sublingual dust mite immunotherapy with Christine Mason.  We also discussed traditional subcutaneous allergen immunotherapy.  After discussion about the two, she decided to go with the traditional subcutaneous immunotherapy since we can include all of her allergic triggers.  She will make an appointment in 2 to 3 weeks to start this treatment.  In the meantime, we are going to restart her Xyzal and add on Singulair to see if this could help with the itching.  She does have dust mite coverings at home, although she has not installed them yet.  She is going to do that.  Her cats are a big part of her life and do go into her room, so we did discuss ways to decrease the dander load in her bedroom at least.  This includes use of a HEPA filter.  However, the immunotherapy will also help.   Plan/Recommendations:   1. Itching - Testing today showed: grasses, ragweed, weeds, indoor molds, dust mites and cat (dust mites were the most reactive) - Copy of test results provided.  - Avoidance measures provided. - Continue with: Xyzal (levocetirizine) 61m tablet once daily and Nasacort (triamcinolone) one spray per nostril daily - Start taking: Singulair (montelukast) 165mdaily - You can use an extra dose of the antihistamine, if needed, for breakthrough symptoms.  - Definitely put on the dust mite covers on your bedding. - Consider the addition of Odactra (dust mite sublingual immunotherapy). - Consider allergy shots, which would cover all of your triggers from testing today.   2. Dysphagia (difficulty  swallowing) - Consider Gastroenterologist referral in the future. - Continue Protonix 1-2 times daily.  4. Return in about 3 months (around 04/16/2019). This can be an in-person, a virtual Webex or a telephone follow up visit.   Subjective:   Christine Mason a [age].o. 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  Christine Mason a [age].o. female presenting for skin testing.  She was last seen 1 week ago.  At that time, she had taken Xyzal, therefore we could not do testing.  We started her on a prednisone pack to help with itching since we were taking her antihistamine off.  We did start her on Nasacort 1 spray per nostril daily, which I felt might help with the postnasal drip.  She was also endorsing dysphasia, so we briefly discussed eosinophilic esophagitis.  Since the last visit, she has done well. She did start the prednisone on Sunday and it has provided quite a bit of relief of her itching. Typically she does not like the side effects of the prednisone but she is glad that she started it.   Otherwise, there have been no changes to her past medical history, surgical history, family history, or social history.  Review of Systems  Constitutional: Negative.  Negative for fever, malaise/fatigue and weight loss.  HENT: Negative.  Negative for congestion, ear discharge and ear pain.   Eyes: Negative for pain, discharge and redness.  Respiratory: Negative for cough, sputum production, shortness of breath and wheezing.   Cardiovascular: Negative.  Negative for chest pain and palpitations.  Gastrointestinal: Negative for abdominal pain, heartburn, nausea and vomiting.  Skin:  Negative.  Negative for itching and rash.  Neurological: Negative for dizziness and headaches.  Endo/Heme/Allergies: Negative for environmental allergies. Does not bruise/bleed easily.       Objective:   Blood pressure (!) 134/92, pulse 65, resp. rate 20, height _0  (1.575 m), weight (!) 311 lb (141.1 kg), last menstrual period 05/22/2015, SpO2 95 %. Body mass index is 56.88 kg/m.   Physical Exam: deferred since this was a skin testing appointment only   Diagnostic studies:    Allergy Studies:    Airborne Adult Perc - 01/15/19 0800    Time Antigen Placed  8115    Allergen Manufacturer  Lavella Hammock    Location  Back    Number of Test  59    Panel 1  Select    1. Control-Buffer 50% Glycerol  Negative    2. Control-Histamine 1 mg/ml  2+    3. Albumin saline  Negative    4. [place]  Negative    5. [place]  Negative    6. Johnson  Negative    7. [place] Blue  Negative    8. Meadow Fescue  Negative    9. Perennial Rye  Negative    10. Sweet Vernal  Negative    11. Timothy  Negative    12. Cocklebur  Negative    13. Burweed [place]er  Negative    14. Ragweed, short  Negative    15. Ragweed, Giant  Negative    16. Plantain,  English  Negative    17. Lamb's Quarters  Negative    18. Sheep Sorrell  Negative    19. Rough Pigweed  Negative    20. Marsh Elder, Rough  Negative    21. Mugwort, Common  Negative    22. Ash mix  Negative    23. Birch mix  Negative    24. Beech American  Negative    25. Box, Elder  Negative    26. Cedar, red  Negative    27. Cottonwood, [place]  Negative    28. Elm mix  Negative    29. Hickory mix  Negative    30. Maple mix  Negative    31. Oak, [place] mix  Negative    32. Pecan Pollen  Negative    33. Pine mix  Negative    34. Sycamore Eastern  Negative    35. [place], Black Pollen  Negative    36. Alternaria alternata  Negative    37. Cladosporium Herbarum  Negative    38. Aspergillus mix  Negative    39. Penicillium mix  Negative    40.  Bipolaris sorokiniana (Helminthosporium)  Negative    41. Drechslera spicifera (Curvularia)  Negative    42. Mucor plumbeus  Negative    43. Fusarium moniliforme  Negative    44. Aureobasidium pullulans (pullulara)  Negative    45. Rhizopus oryzae  Negative    46. Botrytis cinera  Negative    47. Epicoccum nigrum  Negative    48. Phoma betae  Negative    49. Candida Albicans  Negative    50. Trichophyton mentagrophytes  Negative    51. Mite, D Farinae  5,000 AU/ml  Negative    52. Mite, D Pteronyssinus  5,000 AU/ml  Negative    53. Cat Hair 10,000 BAU/ml  Negative    54.  Dog Epithelia  Negative    55. Mixed Feathers  Negative    56. Horse Epithelia  Negative    57. Cockroach, German  Negative    58. Mouse  Negative    59. Tobacco Leaf  Negative     Food Perc - 01/15/19 0800    Time Antigen Placed  0820    Allergen Manufacturer  Lavella Hammock    Location  Back    Number of allergen test  10    Food  Select    1. Peanut  Negative    2. Soybean food  Negative    3. Wheat, whole  Negative    4. Sesame  Negative    5. Milk, cow  Negative    6. Egg White, chicken  Negative    7. Casein  Negative    8. Shellfish mix  Negative    9. Fish mix  Negative    10. Cashew  Negative     Intradermal - 01/15/19 0850    Time Antigen Placed  0850    Allergen Manufacturer  Lavella Hammock    Location  Arm    Number of Test  15    Intradermal  Select    Control  Negative    [place]  Negative    Johnson  1+    7 Grass  Negative    Ragweed mix  1+    Weed mix  1+    Tree mix  Negative    Mold 1  Negative    Mold 2  2+    Mold 3  Negative    Mold 4  Negative    Cat  1+    Dog  Negative    Cockroach  Negative    Mite mix  4+       Allergy testing results were read and interpreted by myself, documented by clinical staff.      Salvatore Marvel, MD  [facility]

## 2019-01-15 NOTE — Patient Instructions (Addendum)
1. Itching - Testing today showed: grasses, ragweed, weeds, indoor molds, dust mites and cat (dust mites were the most reactive) - Copy of test results provided.  - Avoidance measures provided. - Continue with: Xyzal (levocetirizine) 5mg  tablet once daily and Nasacort (triamcinolone) one spray per nostril daily - Start taking: Singulair (montelukast) 10mg  daily - You can use an extra dose of the antihistamine, if needed, for breakthrough symptoms.  - Definitely put on the dust mite covers on your bedding. - Consider the addition of Odactra (dust mite sublingual immunotherapy). - Consider allergy shots, which would cover all of your triggers from testing today.   2. Dysphagia (difficulty swallowing) - Consider Gastroenterologist referral in the future. - Continue Protonix 1-2 times daily.  4. Return in about 3 months (around 04/16/2019). This can be an in-person, a virtual Webex or a telephone follow up visit.   Please inform us of any Emergency Department visits, hospitalizations, or changes in symptoms. Call us before going to the ED for breathing or allergy symptoms since we might be able to fit you in for a sick visit. Feel free to contact us anytime with any questions, problems, or concerns.  It was a pleasure to see you again today!  Websites that have reliable patient information: 1. American Academy of Asthma, Allergy, and Immunology: www.aaaai.org 2. Food Allergy Research and Education (FARE): foodallergy.org 3. Mothers of Asthmatics: http://www.asthmacommunitynetwork.org 4. American College of Allergy, Asthma, and Immunology: www.acaai.org  "Like" [place] on Facebook and Instagram for our latest updates!      Make sure you are registered to vote! If you have moved or changed any of your contact information, you will need to get this updated before voting!    Voter ID laws are NOT going into effect for the General Election in November 2020! DO NOT let this stop you from  exercising your right to vote!    Reducing Pollen Exposure  The American Academy of Allergy, Asthma and Immunology suggests the following steps to reduce your exposure to pollen during allergy seasons.    1. Do not hang sheets or clothing out to dry; pollen may collect on these items. 2. Do not mow lawns or spend time around freshly cut grass; mowing stirs up pollen. 3. Keep windows closed at night.  Keep car windows closed while driving. 4. Minimize morning activities outdoors, a time when pollen counts are usually at their highest. 5. Stay indoors as much as possible when pollen counts or humidity is high and on windy days when pollen tends to remain in the air longer. 6. Use air conditioning when possible.  Many air conditioners have filters that trap the pollen spores. 7. Use a HEPA room air filter to remove pollen form the indoor air you breathe.  Control of Mold Allergen   Mold and fungi can grow on a variety of surfaces provided certain temperature and moisture conditions exist.  Outdoor molds grow on plants, decaying vegetation and soil.  The major outdoor mold, Alternaria and Cladosporium, are found in very high numbers during hot and dry conditions.  Generally, a late Summer - Fall peak is seen for common outdoor fungal spores.  Rain will temporarily lower outdoor mold spore count, but counts rise rapidly when the rainy period ends.  The most important indoor molds are Aspergillus and Penicillium.  Dark, humid and poorly ventilated basements are ideal sites for mold growth.  The next most common sites of mold growth are the bathroom and the kitchen.  Indoor (Perennial) Mold Control   Positive indoor molds via skin testing: Aspergillus and Penicillium  1. Maintain humidity below 50%. 2. Clean washable surfaces with 5% bleach solution. 3. Remove sources e.g. contaminated carpets.     Control of House Dust Mite Allergen    House dust mites play a major role in allergic  asthma and rhinitis.  They occur in environments with high humidity wherever human skin, the food for dust mites is found. High levels have been detected in dust obtained from mattresses, pillows, carpets, upholstered furniture, bed covers, clothes and soft toys.  The principal allergen of the house dust mite is found in its feces.  A gram of dust may contain 1,000 mites and 250,000 fecal particles.  Mite antigen is easily measured in the air during house cleaning activities.    1. Encase mattresses, including the box spring, and pillow, in an air tight cover.  Seal the zipper end of the encased mattresses with wide adhesive tape. 2. Wash the bedding in water of 130 degrees Farenheit weekly.  Avoid cotton comforters/quilts and flannel bedding: the most ideal bed covering is the dacron comforter. 3. Remove all upholstered furniture from the bedroom. 4. Remove carpets, carpet padding, rugs, and non-washable window drapes from the bedroom.  Wash drapes weekly or use plastic window coverings. 5. Remove all non-washable stuffed toys from the bedroom.  Wash stuffed toys weekly. 6. Have the room cleaned frequently with a vacuum cleaner and a damp dust-mop.  The patient should not be in a room which is being cleaned and should wait 1 hour after cleaning before going into the room. 7. Close and seal all heating outlets in the bedroom.  Otherwise, the room will become filled with dust-laden air.  An electric heater can be used to heat the room. 8. Reduce indoor humidity to less than 50%.  Do not use a humidifier.  Control of Dog or Cat Allergen  Avoidance is the best way to manage a dog or cat allergy. If you have a dog or cat and are allergic to dog or cats, consider removing the dog or cat from the home. If you have a dog or cat but don't want to find it a new home, or if your family wants a pet even though someone in the household is allergic, here are some strategies that may help keep symptoms at bay:   1. Keep the pet out of your bedroom and restrict it to only a few rooms. Be advised that keeping the dog or cat in only one room will not limit the allergens to that room. 2. Don't pet, hug or kiss the dog or cat; if you do, wash your hands with soap and water. 3. High-efficiency particulate air (HEPA) cleaners run continuously in a bedroom or living room can reduce allergen levels over time. 4. Regular use of a high-efficiency vacuum cleaner or a central vacuum can reduce allergen levels. 5. Giving your dog or cat a bath at least once a week can reduce airborne allergen.  Allergy Shots   Allergies are the result of a chain reaction that starts in the immune system. Your immune system controls how your body defends itself. For instance, if you have an allergy to pollen, your immune system identifies pollen as an invader or allergen. Your immune system overreacts by producing antibodies called Immunoglobulin E (IgE). These antibodies travel to cells that release chemicals, causing an allergic reaction.  The concept behind allergy immunotherapy, whether it is received  in the form of shots or tablets, is that the immune system can be desensitized to specific allergens that trigger allergy symptoms. Although it requires time and patience, the payback can be long-term relief.  How Do Allergy Shots Work?  Allergy shots work much like a vaccine. Your body responds to injected amounts of a particular allergen given in increasing doses, eventually developing a resistance and tolerance to it. Allergy shots can lead to decreased, minimal or no allergy symptoms.  There generally are two phases: build-up and maintenance. Build-up often ranges from three to six months and involves receiving injections with increasing amounts of the allergens. The shots are typically given once or twice a week, though more rapid build-up schedules are sometimes used.  The maintenance phase begins when the most effective dose is  reached. This dose is different for each person, depending on how allergic you are and your response to the build-up injections. Once the maintenance dose is reached, there are longer periods between injections, typically two to four weeks.  Occasionally doctors give cortisone-type shots that can temporarily reduce allergy symptoms. These types of shots are different and should not be confused with allergy immunotherapy shots.  Who Can Be Treated with Allergy Shots?  Allergy shots may be a good treatment approach for people with allergic rhinitis (hay fever), allergic asthma, conjunctivitis (eye allergy) or stinging insect allergy.   Before deciding to begin allergy shots, you should consider:  . The length of allergy season and the severity of your symptoms . Whether medications and/or changes to your environment can control your symptoms . Your desire to avoid long-term medication use . Time: allergy immunotherapy requires a major time commitment . Cost: may vary depending on your insurance coverage  Allergy shots for children age 61 and older are effective and often well tolerated. They might prevent the onset of new allergen sensitivities or the progression to asthma.  Allergy shots are not started on patients who are pregnant but can be continued on patients who become pregnant while receiving them. In some patients with other medical conditions or who take certain common medications, allergy shots may be of risk. It is important to mention other medications you talk to your allergist.   When Will I Feel Better?  Some may experience decreased allergy symptoms during the build-up phase. For others, it may take as long as 12 months on the maintenance dose. If there is no improvement after a year of maintenance, your allergist will discuss other treatment options with you.  If you aren't responding to allergy shots, it may be because there is not enough dose of the allergen in your  vaccine or there are missing allergens that were not identified during your allergy testing. Other reasons could be that there are high levels of the allergen in your environment or major exposure to non-allergic triggers like tobacco smoke.  What Is the Length of Treatment?  Once the maintenance dose is reached, allergy shots are generally continued for three to five years. The decision to stop should be discussed with your allergist at that time. Some people may experience a permanent reduction of allergy symptoms. Others may relapse and a longer course of allergy shots can be considered.  What Are the Possible Reactions?  The two types of adverse reactions that can occur with allergy shots are local and systemic. Common local reactions include very mild redness and swelling at the injection site, which can happen immediately or several hours after. A systemic reaction,  which is less common, affects the entire body or a particular body system. They are usually mild and typically respond quickly to medications. Signs include increased allergy symptoms such as sneezing, a stuffy nose or hives.  Rarely, a serious systemic reaction called anaphylaxis can develop. Symptoms include swelling in the throat, wheezing, a feeling of tightness in the chest, nausea or dizziness. Most serious systemic reactions develop within 30 minutes of allergy shots. This is why it is strongly recommended you wait in your doctor's office for 30 minutes after your injections. Your allergist is trained to watch for reactions, and his or her staff is trained and equipped with the proper medications to identify and treat them.  Who Should Administer Allergy Shots?  The preferred location for receiving shots is your prescribing allergist's office. Injections can sometimes be given at another facility where the physician and staff are trained to recognize and treat reactions, and have received instructions by your prescribing  allergist.

## 2019-01-16 DIAGNOSIS — J301 Allergic rhinitis due to pollen: Secondary | ICD-10-CM | POA: Diagnosis not present

## 2019-01-20 DIAGNOSIS — J3089 Other allergic rhinitis: Secondary | ICD-10-CM

## 2019-01-28 ENCOUNTER — Ambulatory Visit (HOSPITAL_COMMUNITY)
Admission: RE | Admit: 2019-01-28 | Discharge: 2019-01-28 | Disposition: A | Discharge status: Home / Self Care | Payer: BLUE CROSS/BLUE SHIELD | Source: Ambulatory Visit | Attending: Family Medicine | Admitting: Family Medicine

## 2019-01-28 ENCOUNTER — Other Ambulatory Visit: Payer: Self-pay | Admitting: Family Medicine

## 2019-01-28 ENCOUNTER — Other Ambulatory Visit: Payer: Self-pay

## 2019-01-28 DIAGNOSIS — M79671 Pain in right foot: Secondary | ICD-10-CM

## 2019-01-31 ENCOUNTER — Encounter: Payer: Self-pay | Admitting: Allergy & Immunology

## 2019-02-05 ENCOUNTER — Other Ambulatory Visit: Payer: Self-pay

## 2019-02-05 ENCOUNTER — Ambulatory Visit (INDEPENDENT_AMBULATORY_CARE_PROVIDER_SITE_OTHER): Payer: BLUE CROSS/BLUE SHIELD

## 2019-02-05 DIAGNOSIS — J309 Allergic rhinitis, unspecified: Secondary | ICD-10-CM | POA: Diagnosis not present

## 2019-02-05 NOTE — Progress Notes (Signed)
Immunotherapy   Patient Details  Name: Christine Mason MRN: [id number] Date of Birth: 02-Apr-1971  02/05/2019  Christine Mason started allergy injections. She received 0.05 of both her blue vials. One with Grass-Weed-Cat and the other with Molds-DM. Patient waited in an exam room for 30 minutes with no problems. Following schedule: B Frequency: 1-2 times weekly Epi-Pen: Yes Consent signed and patient instructions given.   Herbie Drape 02/05/2019, 3:10 PM

## 2019-02-12 ENCOUNTER — Ambulatory Visit (INDEPENDENT_AMBULATORY_CARE_PROVIDER_SITE_OTHER): Payer: BLUE CROSS/BLUE SHIELD

## 2019-02-12 DIAGNOSIS — J309 Allergic rhinitis, unspecified: Secondary | ICD-10-CM

## 2019-02-19 ENCOUNTER — Ambulatory Visit (INDEPENDENT_AMBULATORY_CARE_PROVIDER_SITE_OTHER): Payer: BLUE CROSS/BLUE SHIELD

## 2019-02-19 DIAGNOSIS — J309 Allergic rhinitis, unspecified: Secondary | ICD-10-CM

## 2019-02-26 ENCOUNTER — Ambulatory Visit (INDEPENDENT_AMBULATORY_CARE_PROVIDER_SITE_OTHER): Payer: BLUE CROSS/BLUE SHIELD

## 2019-02-26 DIAGNOSIS — J309 Allergic rhinitis, unspecified: Secondary | ICD-10-CM | POA: Diagnosis not present

## 2019-03-05 ENCOUNTER — Ambulatory Visit (INDEPENDENT_AMBULATORY_CARE_PROVIDER_SITE_OTHER): Payer: BLUE CROSS/BLUE SHIELD | Admitting: *Deleted

## 2019-03-05 DIAGNOSIS — J309 Allergic rhinitis, unspecified: Secondary | ICD-10-CM

## 2019-03-06 ENCOUNTER — Encounter: Payer: BLUE CROSS/BLUE SHIELD | Admitting: Obstetrics and Gynecology

## 2019-03-12 ENCOUNTER — Ambulatory Visit (INDEPENDENT_AMBULATORY_CARE_PROVIDER_SITE_OTHER): Payer: BLUE CROSS/BLUE SHIELD

## 2019-03-12 ENCOUNTER — Telehealth: Payer: Self-pay | Admitting: Obstetrics and Gynecology

## 2019-03-12 DIAGNOSIS — J309 Allergic rhinitis, unspecified: Secondary | ICD-10-CM | POA: Diagnosis not present

## 2019-03-12 NOTE — Telephone Encounter (Signed)

## 2019-03-13 ENCOUNTER — Ambulatory Visit (INDEPENDENT_AMBULATORY_CARE_PROVIDER_SITE_OTHER): Payer: BLUE CROSS/BLUE SHIELD | Admitting: Obstetrics and Gynecology

## 2019-03-13 ENCOUNTER — Encounter: Payer: Self-pay | Admitting: Obstetrics and Gynecology

## 2019-03-13 ENCOUNTER — Other Ambulatory Visit: Payer: Self-pay

## 2019-03-13 VITALS — BP 148/89 | HR 75 | Ht 63.0 in | Wt 325.0 lb

## 2019-03-13 DIAGNOSIS — N951 Menopausal and female climacteric states: Secondary | ICD-10-CM | POA: Diagnosis not present

## 2019-03-13 DIAGNOSIS — Z1501 Genetic susceptibility to malignant neoplasm of breast: Secondary | ICD-10-CM

## 2019-03-13 NOTE — Progress Notes (Signed)
Patient ID: CRISS BARTLES, female   DOB: [birth year], 48 y.o.   MRN: [id number]    [facility] Visit  _0 @            Patient name: Christine Mason MRN [id number]  Date of birth: Feb 09, 1971  CC & HPI:  Christine Mason is a 48 y.o. female [place] presenting today to discuss starting estrogen therapy. She has daily hot flashes but is not sure if they are caused by her medication. She also has night sweats but they do not affect her sleep very much.  She has been postmenopausal for over a year. She has a CHEK2 mutation.  I spent some time on up-to-date and check mutation triples the risk of breast cancer and accelerates the agent which is developed.  Most breast cancers are estrogen receptor positive when the patient is chek 2 positive. Her mother was diagnosed with breast cancer in her 65s and sister in her late 71s. Pt gets yearly mammograms. She still has her uterus but denies bleeding and abnormalities.  There does not appear to be increased risk of ovarian cancer with check to genetic mutation based on my initial review of up-to-date The pt is not physically active but has been talking to her doctor about how to lose weight. They discussed putting her on weight loss medication. She does not crave food but she eats when she is not hungry. 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 He needs a knee replacement in a few months and is overweight. The patient denies sexual activity, fever, chills or any other symptoms or complaints at this time.   ROS:  ROS + hot flashes + night sweats - fever - chills All systems are negative except as noted in the HPI and PMH.   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  She is allowed to use caregiver responsibilities to "trapped her" and to a lifestyle of reduced activity Medical         Past Medical  History:  Diagnosis Date  . Chronic leg pain    right  . Chronic pain of left knee   . Depression   . Diverticulitis   . GERD (gastroesophageal reflux disease)   . Hypertension                               Surgical Hx:    Past Surgical History:  Procedure Laterality Date  . ACNE CYST REMOVAL    . CHOLECYSTECTOMY    . DILATION AND CURETTAGE OF UTERUS  1998  . KNEE SURGERY    . MASS EXCISION     Medications: Reviewed & Updated - see associated section                       Current Outpatient Medications:  .  ALPRAZolam (XANAX) 0.5 MG tablet, Take 1 tablet by mouth at bedtime., Disp: , Rfl: 1 .  atenolol (TENORMIN) 50 MG tablet, Take 1 tablet (50 mg total) by mouth daily., Disp: , Rfl:  .  EPINEPHrine 0.3 mg/0.3 mL IJ SOAJ injection, , Disp: , Rfl:  .  gabapentin (NEURONTIN) 300 MG capsule, Take 1 capsule (300 mg total) by mouth at bedtime. (Patient taking differently:  Take 300 mg by mouth every 6 (six) hours. ), Disp: , Rfl:  .  hydrochlorothiazide (HYDRODIURIL) 25 MG tablet, , Disp: , Rfl:  .  HYDROcodone-acetaminophen (NORCO/VICODIN) 5-325 MG tablet, , Disp: , Rfl:  .  levocetirizine (XYZAL) 5 MG tablet, Take 5 mg by mouth every evening., Disp: , Rfl:  .  losartan (COZAAR) 100 MG tablet, , Disp: , Rfl:  .  LOSARTAN POTASSIUM PO, Take 100 mg by mouth daily., Disp: , Rfl:  .  methocarbamol (ROBAXIN-750) 750 MG tablet, Take 1 tablet (750 mg total) by mouth at bedtime. (Patient taking differently: Take 750-1,500 mg by mouth every 8 (eight) hours as needed. Take 1-2 tablets up to 3 times daily.), Disp: , Rfl:  .  montelukast (SINGULAIR) 10 MG tablet, Take 1 tablet (10 mg total) by mouth at bedtime., Disp: 30 tablet, Rfl: 5 .  Multiple Vitamins-Minerals (MULTIVITAMIN WITH MINERALS) tablet, Take 1 tablet by mouth daily., Disp: , Rfl:  .  ondansetron (ZOFRAN) 4 MG tablet, Take 1 tablet (4 mg total) by mouth every 8 (eight) hours as needed for nausea or vomiting., Disp: 30 tablet, Rfl: 1 .   oxybutynin (DITROPAN-XL) 5 MG 24 hr tablet, Take 5 mg by mouth daily., Disp: , Rfl:  .  pantoprazole (PROTONIX) 40 MG tablet, Take 40 mg by mouth daily.  , Disp: , Rfl:  .  QUEtiapine (SEROQUEL) 25 MG tablet, Take 50 mg by mouth at bedtime., Disp: , Rfl:  .  traZODone (DESYREL) 100 MG tablet, Take 100 mg by mouth at bedtime., Disp: , Rfl:  .  UNABLE TO FIND, Allergy shots-Wednesday, Disp: , Rfl:  .  venlafaxine XR (EFFEXOR-XR) 75 MG 24 hr capsule, Take 225 mg by mouth daily., Disp: , Rfl:   Social History: Reviewed -  reports that she quit smoking about 17 months ago. Her smoking use included cigarettes. She has a 2.50 pack-year smoking history. She has never used smokeless tobacco.  Objective Findings:  Vitals: Blood pressure (!) 148/89, pulse 75, height _0  (1.6 m), weight (!) 325 lb (147.4 kg), last menstrual period 05/22/2015.  PHYSICAL EXAMINATION General appearance - alert, well appearing, and in no distress, oriented to person, place, and time and overweight Mental status - alert, oriented to person, place, and time, normal mood, behavior, speech, dress, motor activity, and thought processes, affect appropriate to mood  PELVIC DEFERRED  Discussion: Discussed with pt weight loss methods including food measurement and calorie counting using apps. Additionally encouraged pt to become more active by taking daily walks of at least 30 minute duration, join a local gym such as the [facility] or attend water aerobics classes. Also advised pt to use pedometer on smartphone or utilize a smartband such as FitBit to keep track of daily activity.   At end of discussion, pt had opportunity to ask questions and has no further questions at this time.   Specific discussion of lifestyle changes, behavioral modifications and healthy eating habits as noted above. Greater than 50% was spent in counseling and coordination of care with the patient.  Total time greater than: 30 minutes.   Assessment & Plan:    A:  1. Morbid Obesity Body mass index is 57.57 kg/m.  2. Hypertension 3. Postmenopausal for 1 yr 4. CHEK2 mutation - FHx of breast cancer (mother and sister)  P:  1. Consider estrogen therapy after pt sees a Dietitian 2. Prioritize weight loss  By signing my name below, I, De Burrs, attest that this  documentation has been prepared under the direction and in the presence of Jonnie Kind, MD. Electronically Signed: De Burrs, Medical Scribe. 03/13/19. 9:36 AM.  I personally performed the services described in this documentation, which was SCRIBED in my presence. The recorded information has been reviewed and considered accurate. It has been edited as necessary during review. Jonnie Kind, MD

## 2019-03-13 NOTE — Patient Instructions (Signed)

## 2019-03-19 ENCOUNTER — Ambulatory Visit (INDEPENDENT_AMBULATORY_CARE_PROVIDER_SITE_OTHER): Payer: BLUE CROSS/BLUE SHIELD

## 2019-03-19 DIAGNOSIS — J309 Allergic rhinitis, unspecified: Secondary | ICD-10-CM

## 2019-03-25 ENCOUNTER — Other Ambulatory Visit: Payer: BLUE CROSS/BLUE SHIELD

## 2019-03-25 ENCOUNTER — Other Ambulatory Visit: Payer: Self-pay

## 2019-03-25 DIAGNOSIS — Z20822 Contact with and (suspected) exposure to covid-19: Secondary | ICD-10-CM

## 2019-03-29 LAB — NOVEL CORONAVIRUS, NAA: SARS-CoV-2, NAA: NOT DETECTED

## 2019-04-09 ENCOUNTER — Other Ambulatory Visit (HOSPITAL_BASED_OUTPATIENT_CLINIC_OR_DEPARTMENT_OTHER): Payer: Self-pay

## 2019-04-09 DIAGNOSIS — R5383 Other fatigue: Secondary | ICD-10-CM

## 2019-04-09 DIAGNOSIS — R0683 Snoring: Secondary | ICD-10-CM

## 2019-04-16 ENCOUNTER — Other Ambulatory Visit: Payer: Self-pay

## 2019-04-16 ENCOUNTER — Ambulatory Visit: Payer: BLUE CROSS/BLUE SHIELD | Admitting: Allergy & Immunology

## 2019-04-16 ENCOUNTER — Encounter: Payer: Self-pay | Admitting: Allergy & Immunology

## 2019-04-16 ENCOUNTER — Ambulatory Visit (INDEPENDENT_AMBULATORY_CARE_PROVIDER_SITE_OTHER): Payer: BLUE CROSS/BLUE SHIELD | Admitting: Allergy & Immunology

## 2019-04-16 DIAGNOSIS — R1310 Dysphagia, unspecified: Secondary | ICD-10-CM | POA: Diagnosis not present

## 2019-04-16 DIAGNOSIS — J302 Other seasonal allergic rhinitis: Secondary | ICD-10-CM | POA: Diagnosis not present

## 2019-04-16 DIAGNOSIS — L299 Pruritus, unspecified: Secondary | ICD-10-CM | POA: Diagnosis not present

## 2019-04-16 DIAGNOSIS — J3089 Other allergic rhinitis: Secondary | ICD-10-CM

## 2019-04-16 NOTE — Progress Notes (Signed)
Patient Care Team: Celene Squibb, MD as PCP - General (Internal Medicine)  DIAGNOSIS:    ICD-10-CM   1. 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 with above-mentioned history of CHEK2 mutation who is at high risk for breast cancer. Mammogram on 07/22/18 showed no evidence of malignancy bilaterally. I last saw her a year ago. She presents to the clinic today for annual follow-up.  Patient does not want to do breast MRIs because of cost.  She is still contemplating on by having bilateral mastectomies.  Her husband has multiple health issues and he recently fractured his hip.  Because of this she is a primary caregiver.  She does not want to do any surgeries this year.  REVIEW OF SYSTEMS:   Constitutional: Denies fevers, chills or abnormal weight loss Eyes: Denies blurriness of vision Ears, nose, mouth, throat, and face: Denies mucositis or sore throat Respiratory: Denies cough, dyspnea or wheezes Cardiovascular: Denies palpitation, chest discomfort Gastrointestinal: Denies nausea, heartburn or change in bowel habits Skin: Denies abnormal skin rashes Lymphatics: Denies new lymphadenopathy or easy bruising Neurological: Denies numbness, tingling or new weaknesses Behavioral/Psych: Mood is stable, no new changes  Extremities: No lower extremity edema Breast: Complaining of tingling sensation in the breast intermittently. All other systems were reviewed with the patient and are negative.  I have reviewed the past medical history, past surgical history, social history and family history with the patient and they are unchanged from previous note.  ALLERGIES:  is allergic to penicillins.  MEDICATIONS:  Current Outpatient Medications  Medication Sig Dispense Refill  . ALPRAZolam (XANAX) 0.5 MG tablet Take 1 tablet by mouth at bedtime.  1  .  atenolol (TENORMIN) 50 MG tablet Take 1 tablet (50 mg total) by mouth daily.    Marland Kitchen EPINEPHrine 0.3 mg/0.3 mL IJ SOAJ injection     . hydrochlorothiazide (HYDRODIURIL) 25 MG tablet     . HYDROcodone-acetaminophen (NORCO/VICODIN) 5-325 MG tablet     . levocetirizine (XYZAL) 5 MG tablet Take 1 tablet (5 mg total) by mouth every morning.    Marland Kitchen losartan (COZAAR) 100 MG tablet     . LOSARTAN POTASSIUM PO Take 100 mg by mouth daily.    . methocarbamol (ROBAXIN-750) 750 MG tablet Take 1 tablet (750 mg total) by mouth at bedtime. (Patient taking differently: Take 750-1,500 mg by mouth every 8 (eight) hours as needed. Take 1-2 tablets up to 3 times daily.)    . montelukast (SINGULAIR) 10 MG tablet Take 1 tablet (10 mg total) by mouth at bedtime. 30 tablet 5  . Multiple Vitamins-Minerals (MULTIVITAMIN WITH MINERALS) tablet Take 1 tablet by mouth daily.    . ondansetron (ZOFRAN) 4 MG tablet Take 1 tablet (4 mg total) by mouth every 8 (eight) hours as needed for nausea or vomiting. 30 tablet 1  . oxybutynin (DITROPAN-XL) 5 MG 24 hr tablet Take 5 mg by mouth daily.    . pantoprazole (PROTONIX) 40 MG tablet Take 40 mg by mouth daily.      Marland Kitchen UNABLE TO FIND Allergy shots-Wednesday    . venlafaxine XR (EFFEXOR-XR) 75 MG 24 hr capsule Take 225 mg by mouth daily.     No current facility-administered medications for this visit.     PHYSICAL EXAMINATION: ECOG PERFORMANCE STATUS: 1 - Symptomatic but completely ambulatory  Vitals:   04/18/19  0836  BP: (!) 145/78  Pulse: 80  Resp: 18  Temp: 98.2 F (36.8 C)  SpO2: 97%   Filed Weights   04/18/19 0836  Weight: 223 lb 1.6 oz (101.2 kg)    GENERAL: alert, no distress and comfortable SKIN: skin color, texture, turgor are normal, no rashes or significant lesions EYES: normal, Conjunctiva are pink and non-injected, sclera clear OROPHARYNX: no exudate, no erythema and lips, buccal mucosa, and tongue normal  NECK: supple, thyroid normal size, non-tender, without  nodularity LYMPH: no palpable lymphadenopathy in the cervical, axillary or inguinal LUNGS: clear to auscultation and percussion with normal breathing effort HEART: regular rate & rhythm and no murmurs and no lower extremity edema ABDOMEN: abdomen soft, non-tender and normal bowel sounds MUSCULOSKELETAL: no cyanosis of digits and no clubbing  NEURO: alert & oriented x 3 with fluent speech, no focal motor/sensory deficits EXTREMITIES: No lower extremity edema BREAST: No palpable masses or nodules in either right or left breasts. No palpable axillary supraclavicular or infraclavicular adenopathy no breast tenderness or nipple discharge. (exam performed in the presence of a chaperone)  LABORATORY DATA:  I have reviewed the data as listed CMP Latest Ref Rng & Units 09/13/2018 08/25/2018 08/01/2018  Glucose 70 - 99 mg/dL 101(H) 142(H) 89  BUN 6 - 20 mg/dL 12 11 11   Creatinine 0.44 - 1.00 mg/dL 0.67 0.69 0.61  Sodium 135 - 145 mmol/L 139 137 139  Potassium 3.5 - 5.1 mmol/L 3.7 3.3(L) 3.7  Chloride 98 - 111 mmol/L 102 99 104  CO2 22 - 32 mmol/L 29 30 26   Calcium 8.9 - 10.3 mg/dL 9.6 9.3 9.8  Total Protein 6.5 - 8.1 g/dL - 7.4 7.4  Total Bilirubin 0.3 - 1.2 mg/dL - 0.7 0.7  Alkaline Phos 38 - 126 U/L - 60 48  AST 15 - 41 U/L - 33 41  ALT 0 - 44 U/L - 48(H) 61(H)    Lab Results  Component Value Date   WBC 6.2 09/13/2018   HGB 13.1 09/13/2018   HCT 41.3 09/13/2018   MCV 97.6 09/13/2018   PLT 336 09/13/2018   NEUTROABS 3.2 09/13/2018    ASSESSMENT & PLAN:  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  Lifetime risk of breast cancer in vary from 28% to 38%. Higher with family history of breast cancer.   Breast Cancer Surveillance: 1. Breast exam  04/18/2019: Normal 2. Mammogram  07/22/2018  benign. Breast Density Category B. Patient has not done breast MRIs even thoughwerecommended Itprimarily because of cost concerns. Patient fully understands that the best way to do surveillance with Chek 2 mutation is through MRIs.  Patient wants to consider bilateral mastectomies but she made up her decision not to do the surgery at this time.   She may decide to have bilateral mastectomies next year.  Return to clinic in 1 year for follow-up    No orders of the defined types were placed in this encounter.  The patient has a good understanding of the overall plan. she agrees with it. she will call with any problems that may develop before the next visit here.  Nicholas Lose, MD 04/18/2019  Julious Oka Dorshimer am acting as scribe for Dr. Nicholas Lose.  I have reviewed the above documentation for accuracy and completeness, and I agree with the above.

## 2019-04-16 NOTE — Progress Notes (Signed)
RE: Christine Mason MRN: [id number] DOB: [birth year] Date of Telemedicine Visit: 04/16/2019  Referring provider: Celene Squibb, MD Primary care provider: Celene Squibb, MD  Chief Complaint: Allergic Rhinitis  (Televisit at home. Patient gave verbal consent to treat and bill insurance for this visit.)   Telemedicine Follow Up Visit via Telephone: I connected with Christine Mason for a follow up on 04/16/19 by telephone and verified that I am speaking with the correct person using two identifiers.   I discussed the limitations, risks, security and privacy concerns of performing an evaluation and management service by telephone and the availability of in person appointments. I also discussed with the patient that there may be a patient responsible charge related to this service. The patient expressed understanding and agreed to proceed.  Patient is at home.  Provider is at the office.  Visit start time: 10:02 AM Visit end time: 10:17 AM Insurance consent/check in by: Christine Mason Medical consent and medical assistant/nurse: Christine Mason  History of Present Illness:  She is a 48 y.o. female, who is being followed for itching as well as P/SAR and dysphagia. Her previous allergy office visit was in April 2020 with myself.  At that visit, we did end up doing allergy testing.  This is been rescheduled since she was on Xyzal the first time that we saw her.  Her testing was positive to grasses, ragweed, weeds, indoor molds, dust mite, and cat.  Dust mites were definitely the most reactive, but all were reactive on intradermal testing.  For the itching, we continued her Xyzal.  We added on Nasacort to help with any rhinitis symptoms.  We added on Singulair 10 mg daily to see if this would help with her itching as well.  I did emphasize the need for dust mite amelioration.  We did discuss allergen immunotherapy as well, and she did make the decision to start this.  She was also endorsing a history of dysphasia.  We  continued her Protonix 1-2 times daily.  We did consider a gastroenterology referral.  Since last visit, she has done well.  She did feel that the initiation of the allergen immunotherapy helped with her itching.  However, she has not been getting her shots since we are temporarily out of [place].  She is planning to restart once we move into her new office there.  She has been on Xyzal 1 tablet in the morning and Singulair 1 tablet at night.  She has not tried doubling up on her Xyzal, but is willing to give this a try.  The itching seems to be worse at night.  She has been on Benadryl in the past which does not really help long-term.  She does get drowsy from the Benadryl.  She has no drowsiness from the Xyzal.  She continues to have dysphagia.  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  She does not remember who her gastroenterologist was at the time but she has had an endoscopy relatively recently.  She is open to another referral to gastroenterology for possible endoscopy.  She lives in [place].  Otherwise, there have been no changes to her past medical history, surgical history, family history, or social history.  Assessment and Plan:  Christine Mason is a 48 y.o. 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 Itching with P/SAR (grasses, ragweed, weeds, indoor molds, dust mites and cat) - I did offer [place] as an alternative in the meantime so that we can keep the dosing of the immunotherapy up. - She gets off of work at [employer], so she could make it to [place] on the late days, which she was not aware was even an option. - In the meantime, we will continue with Xyzal (levocetirizine) 5mg  tablet once daily.  and Nasacort (triamcinolone) one spray per nostril  daily - Start taking: Singulair (montelukast) 10mg  daily - You can use an extra dose of the antihistamine, if needed, for breakthrough symptoms.  - Definitely put on the dust mite covers on your bedding. - Consider the addition of Odactra (dust mite sublingual immunotherapy). - Consider allergy shots, which would cover all of your triggers from testing today.   2. Dysphagia (difficulty swallowing) - Consider Gastroenterologist referral in the future. - Continue Protonix 1-2 times daily.  3. Follow up in six months or earlier if needed. This can be an in-person, a virtual Webex or a telephone follow up visit.  Diagnostics: None.  Medication List:  Current Outpatient Medications  Medication Sig Dispense Refill  . ALPRAZolam (XANAX) 0.5 MG tablet Take 1 tablet by mouth at bedtime.  1  . atenolol (TENORMIN) 50 MG tablet Take 1 tablet (50 mg total) by mouth daily.    Marland Kitchen EPINEPHrine 0.3 mg/0.3 mL IJ SOAJ injection     . hydrochlorothiazide (HYDRODIURIL) 25 MG tablet     . HYDROcodone-acetaminophen (NORCO/VICODIN) 5-325 MG tablet     . levocetirizine (XYZAL) 5 MG tablet Take 5 mg by mouth every evening.    Marland Kitchen losartan (COZAAR) 100 MG tablet     . methocarbamol (ROBAXIN-750) 750 MG tablet Take 1 tablet (750 mg total) by mouth at bedtime. (Patient taking differently: Take 750-1,500 mg by mouth every 8 (eight) hours as needed. Take 1-2 tablets up to 3 times daily.)    . montelukast (SINGULAIR) 10 MG tablet Take 1 tablet (10 mg total) by mouth at bedtime. 30 tablet 5  . Multiple Vitamins-Minerals (MULTIVITAMIN WITH MINERALS) tablet Take 1 tablet by mouth daily.    . ondansetron (ZOFRAN) 4 MG tablet Take 1 tablet (4 mg total) by mouth every 8 (eight) hours as needed for nausea or vomiting. 30 tablet 1  . oxybutynin (DITROPAN-XL) 5 MG 24 hr tablet Take 5 mg by mouth daily.    . pantoprazole (PROTONIX) 40 MG tablet Take 40 mg by mouth daily.      Marland Kitchen UNABLE TO FIND Allergy shots-Wednesday    .  venlafaxine XR (EFFEXOR-XR) 75 MG 24 hr capsule Take 225 mg by mouth daily.    Marland Kitchen gabapentin (NEURONTIN) 300 MG capsule Take 1 capsule (300 mg total) by mouth at bedtime. (Patient not taking: Reported on 04/16/2019)    . LOSARTAN POTASSIUM PO Take 100 mg by mouth daily.    . QUEtiapine (SEROQUEL) 25 MG tablet Take 50 mg by mouth at bedtime.    . traZODone (DESYREL) 100 MG tablet Take 100 mg by mouth at bedtime.     No current facility-administered medications for this visit.    Allergies: Allergies  Allergen Reactions  . Penicillins Rash    Has patient had a PCN reaction causing immediate rash, facial/tongue/throat swelling, SOB or lightheadedness with hypotension: {Yes Has patient had a PCN reaction causing severe rash involving mucus membranes or skin necrosis:unknown Has patient had a  PCN reaction that required hospitalizationNo Has patient had a PCN reaction occurring within the last 10 years:No If all of the above answers are "NO", then may proceed with Cephalosporin use.    I reviewed her past medical history, social history, family history, and environmental history and no significant changes have been reported from previous visits.  Review of Systems  Constitutional: Negative for activity change and appetite change.  HENT: Negative for congestion, postnasal drip, rhinorrhea, sinus pressure and sore throat.   Eyes: Negative for pain, discharge, redness and itching.  Respiratory: Negative for shortness of breath, wheezing and stridor.   Gastrointestinal: Negative for diarrhea, nausea and vomiting.  Musculoskeletal: Negative for arthralgias, joint swelling and myalgias.  Skin: Negative for rash.       Positive for itching.  Allergic/Immunologic: Negative for environmental allergies and food allergies.    Objective:  Physical exam not obtained as encounter was done via telephone.   Previous notes and tests were reviewed.  I discussed the assessment and treatment plan with the  patient. The patient was provided an opportunity to ask questions and all were answered. The patient agreed with the plan and demonstrated an understanding of the instructions.   The patient was advised to call back or seek an in-person evaluation if the symptoms worsen or if the condition fails to improve as anticipated.  I provided 15 minutes of non-face-to-face time during this encounter.  It was my pleasure to participate in [place] Pezzullo's care today. Please feel free to contact me with any questions or concerns.   Sincerely,  Valentina Shaggy, MD

## 2019-04-16 NOTE — Patient Instructions (Signed)
1. Itching with P/SAR (grasses, ragweed, weeds, indoor molds, dust mites and cat) - I did offer [place] as an alternative in the meantime so that we can keep the dosing of the immunotherapy up. - She gets off of work at [employer], so she could make it to [place] on the late days, which she was not aware was even an option. - In the meantime, we will continue with Xyzal (levocetirizine) 5mg  tablet once daily.  and Nasacort (triamcinolone) one spray per nostril daily - Start taking: Singulair (montelukast) 10mg  daily - You can use an extra dose of the antihistamine, if needed, for breakthrough symptoms.  - Definitely put on the dust mite covers on your bedding. - Consider the addition of Odactra (dust mite sublingual immunotherapy). - Consider allergy shots, which would cover all of your triggers from testing today.   2. Dysphagia (difficulty swallowing) - Consider Gastroenterologist referral in the future. - Continue Protonix 1-2 times daily.  3. No follow-ups on file. This can be an in-person, a virtual Webex or a telephone follow up visit.   Please inform us of any Emergency Department visits, hospitalizations, or changes in symptoms. Call us before going to the ED for breathing or allergy symptoms since we might be able to fit you in for a sick visit. Feel free to contact us anytime with any questions, problems, or concerns.  It was a pleasure to talk to you today today!  Websites that have reliable patient information: 1. American Academy of Asthma, Allergy, and Immunology: www.aaaai.org 2. Food Allergy Research and Education (FARE): foodallergy.org 3. Mothers of Asthmatics: http://www.asthmacommunitynetwork.org 4. American College of Allergy, Asthma, and Immunology: www.acaai.org  "Like" [place] on Facebook and Instagram for our latest updates!      Make sure you are registered to vote! If you have moved or changed any of your contact information, you will need to get this updated  before voting!  In some cases, you MAY be able to register to vote online: [email or web address]    Voter ID laws are NOT going into effect for the General Election in November 2020! DO NOT let this stop you from exercising your right to vote!   Absentee voting is the SAFEST way to vote during the coronavirus pandemic!   Download and print an absentee ballot request form at [email or web address] or you can scan the QR code below with your smart phone:      More information on absentee ballots can be found here: [email or web address]

## 2019-04-17 ENCOUNTER — Ambulatory Visit: Payer: BLUE CROSS/BLUE SHIELD | Attending: Internal Medicine | Admitting: Neurology

## 2019-04-17 DIAGNOSIS — G4733 Obstructive sleep apnea (adult) (pediatric): Secondary | ICD-10-CM | POA: Insufficient documentation

## 2019-04-17 DIAGNOSIS — Z79899 Other long term (current) drug therapy: Secondary | ICD-10-CM | POA: Diagnosis not present

## 2019-04-17 DIAGNOSIS — R5383 Other fatigue: Secondary | ICD-10-CM

## 2019-04-17 DIAGNOSIS — R0683 Snoring: Secondary | ICD-10-CM

## 2019-04-18 ENCOUNTER — Inpatient Hospital Stay: Payer: BLUE CROSS/BLUE SHIELD | Attending: Hematology and Oncology | Admitting: Hematology and Oncology

## 2019-04-18 ENCOUNTER — Telehealth: Payer: Self-pay | Admitting: Hematology and Oncology

## 2019-04-18 ENCOUNTER — Other Ambulatory Visit: Payer: Self-pay

## 2019-04-18 DIAGNOSIS — Z803 Family history of malignant neoplasm of breast: Secondary | ICD-10-CM | POA: Insufficient documentation

## 2019-04-18 DIAGNOSIS — Z1501 Genetic susceptibility to malignant neoplasm of breast: Secondary | ICD-10-CM | POA: Insufficient documentation

## 2019-04-18 MED ORDER — LEVOCETIRIZINE DIHYDROCHLORIDE 5 MG PO TABS: 5.0000 mg | ORAL_TABLET | Freq: Every morning | ORAL | Status: DC

## 2019-04-18 NOTE — Telephone Encounter (Signed)
I talk with patient regarding schedule  

## 2019-04-18 NOTE — Assessment & Plan Note (Signed)
CHEK 2 p.V223C variant: Based on NCCN guidelines, this is a pathogenic mutation that has been associated with risk of not only breast cancer but also colon, thyroid and kidney cancers.  Lifetime risk of breast cancer in vary from 28% to 38%. Higher with family history of breast cancer.   Breast Cancer Surveillance: 1. Breast exam  04/18/2019: Normal 2. Mammogram  07/22/2018 benign. Breast Density Category B. Patient has not done breast MRIs even thoughwerecommended Itprimarily because of cost concerns.   Patient initially wanted to see Dr. Excell Seltzer to consider bilateral mastectomies but she made up her decision not to do the surgery at this time.   Return to clinic in 1 year for follow-up

## 2019-04-21 ENCOUNTER — Other Ambulatory Visit: Payer: Self-pay

## 2019-04-21 DIAGNOSIS — Z1231 Encounter for screening mammogram for malignant neoplasm of breast: Secondary | ICD-10-CM

## 2019-04-21 NOTE — Progress Notes (Signed)
Pt called to request appointment for MRI of breast.  Orders placed.  Pt notified that scheduling will contact her with appointment.  No further needs.

## 2019-04-21 NOTE — Procedures (Signed)
   Donaldson A. Merlene Laughter, MD     [email or web address]             HOME SLEEP STUDY  LOCATION: [facility]  Patient Name: Christine Mason, Christine Mason Date: 04/17/2019 Gender: Female D.O.B: 09-Jan-1971 Age (years): 48 Referring Provider: Delphina Cahill Height (inches): 26 Interpreting Physician: Phillips Odor MD, ABSM Weight (lbs): 325 RPSGT: Peak, Robert BMI: 58 MRN: [id number] Neck Size: CLINICAL INFORMATION Sleep Study Type: HST     Indication for sleep study: Fatigue, Snoring     Epworth Sleepiness Score: NA  SLEEP STUDY TECHNIQUE A multi-channel overnight portable sleep study was performed. The channels recorded were: nasal airflow, thoracic respiratory movement, and oxygen saturation with a pulse oximetry. Snoring was also monitored.  MEDICATIONS Patient self administered medications include: N/A.  Current Outpatient Medications:  .  ALPRAZolam (XANAX) 0.5 MG tablet, Take 1 tablet by mouth at bedtime., Disp: , Rfl: 1 .  atenolol (TENORMIN) 50 MG tablet, Take 1 tablet (50 mg total) by mouth daily., Disp: , Rfl:  .  EPINEPHrine 0.3 mg/0.3 mL IJ SOAJ injection, , Disp: , Rfl:  .  hydrochlorothiazide (HYDRODIURIL) 25 MG tablet, , Disp: , Rfl:  .  HYDROcodone-acetaminophen (NORCO/VICODIN) 5-325 MG tablet, , Disp: , Rfl:  .  levocetirizine (XYZAL) 5 MG tablet, Take 1 tablet (5 mg total) by mouth every morning., Disp: , Rfl:  .  losartan (COZAAR) 100 MG tablet, , Disp: , Rfl:  .  LOSARTAN POTASSIUM PO, Take 100 mg by mouth daily., Disp: , Rfl:  .  methocarbamol (ROBAXIN-750) 750 MG tablet, Take 1 tablet (750 mg total) by mouth at bedtime. (Patient taking differently: Take 750-1,500 mg by mouth every 8 (eight) hours as needed. Take 1-2 tablets up to 3 times daily.), Disp: , Rfl:  .  montelukast (SINGULAIR) 10 MG tablet, Take 1 tablet (10 mg total) by mouth at bedtime., Disp: 30 tablet, Rfl: 5 .  Multiple Vitamins-Minerals (MULTIVITAMIN WITH MINERALS) tablet, Take 1  tablet by mouth daily., Disp: , Rfl:  .  ondansetron (ZOFRAN) 4 MG tablet, Take 1 tablet (4 mg total) by mouth every 8 (eight) hours as needed for nausea or vomiting., Disp: 30 tablet, Rfl: 1 .  oxybutynin (DITROPAN-XL) 5 MG 24 hr tablet, Take 5 mg by mouth daily., Disp: , Rfl:  .  pantoprazole (PROTONIX) 40 MG tablet, Take 40 mg by mouth daily.  , Disp: , Rfl:  .  UNABLE TO FIND, Allergy shots-Wednesday, Disp: , Rfl:  .  venlafaxine XR (EFFEXOR-XR) 75 MG 24 hr capsule, Take 225 mg by mouth daily., Disp: , Rfl:    SLEEP ARCHITECTURE Patient was studied for 473.9 minutes. The sleep efficiency was 98.7 % and the patient was supine for 90%. The arousal index was 0.0 per hour.  RESPIRATORY PARAMETERS The overall AHI was 19.9 per hour, with a central apnea index of 0.0 per hour.  The oxygen nadir was 78% during sleep.     CARDIAC DATA Mean heart rate during sleep was 75.3 bpm.  IMPRESSIONS Moderate obstructive sleep apnea occurred during this study (AHI = 19.9/h).  AUTOPAP 8-20 IS RECOMMENDED.   Delano Metz, MD Diplomate, American Board of Sleep Medicine.       ELECTRONICALLY SIGNED ON:  04/21/2019, 7:29 PM [place] PH: [phone]   FX: [phone] [place]

## 2019-04-24 ENCOUNTER — Ambulatory Visit (INDEPENDENT_AMBULATORY_CARE_PROVIDER_SITE_OTHER): Payer: BLUE CROSS/BLUE SHIELD | Admitting: Nurse Practitioner

## 2019-04-26 ENCOUNTER — Emergency Department (HOSPITAL_COMMUNITY)
Admission: EM | Admit: 2019-04-26 | Discharge: 2019-04-26 | Discharge status: Against Medical Advice | Payer: BLUE CROSS/BLUE SHIELD

## 2019-04-26 ENCOUNTER — Other Ambulatory Visit: Payer: Self-pay

## 2019-05-04 NOTE — Progress Notes (Addendum)
Subjective:    Patient ID: Christine Mason, female    DOB: March 02, 1971, 48 y.o.   MRN: [id number]  HPI Christine Mason. 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 No heartburn or stomach pain. She is taking Protonix 61m once daily. Foods such as dry bread, steak and chicken get stuck to her throat/upper esophagus area which occurs once weekly. She drinks water and the food passes down the esophagus. No NSAID use. She underwent 2 EGDs in the past, she cannot recall when these procedures were done, the last EGD was more than 5 years ago. Never had esophageal dilatation. She was diagnosed with an esophageal leiomyoma in 2008 which required a posterior thoracotomy for excision of this mass. She [place] less than 5 [place] ago [place] no polyps were found per her report. She has obstructive sleep apnea confirmed by a recent sleep study. She has not yet started Cpap, no immediate plans to   Past Medical History:  Diagnosis Date  . Chronic leg pain    right  . Chronic pain of left knee   . Depression   . Diverticulitis   . GERD (gastroesophageal reflux disease)   . Hypertension    Past Surgical History:  Procedure Laterality Date  . ACNE CYST REMOVAL    . CHOLECYSTECTOMY    . DILATION AND CURETTAGE OF UTERUS  1998  . KNEE SURGERY    . MASS EXCISION     Current Outpatient Medications on File Prior to Visit  Medication Sig Dispense Refill  . ALPRAZolam (XANAX) 0.5 MG tablet Take 1 tablet by mouth at bedtime.  1  . atenolol (TENORMIN) 50 MG tablet Take 1 tablet (50 mg total) by mouth daily.    .Marland Kitchen EPINEPHrine 0.3 mg/0.3 mL IJ SOAJ injection     . hydrochlorothiazide (HYDRODIURIL) 25 MG tablet     . HYDROcodone-acetaminophen (NORCO/VICODIN) 5-325 MG tablet     . levocetirizine (XYZAL) 5 MG tablet Take 1 tablet (5 mg total) by mouth every morning.    .Marland Kitchenlosartan (COZAAR) 100 MG tablet     . LOSARTAN POTASSIUM PO Take 100 mg by mouth daily.    . methocarbamol (ROBAXIN-750) 750 MG tablet Take 1 tablet (750 mg total) by mouth at bedtime. (Patient taking differently: Take 750-1,500 mg by mouth every 8 (eight) hours as needed. Take 1-2 tablets up to 3 times daily.)    . montelukast (SINGULAIR) 10 MG tablet Take 1 tablet (10 mg total) by mouth at bedtime. 30 tablet 5  . Multiple Vitamins-Minerals (MULTIVITAMIN WITH MINERALS) tablet Take 1 tablet by mouth daily.    . ondansetron (ZOFRAN) 4 MG tablet Take 1 tablet (4 mg total) by mouth every 8 (eight) hours as needed for nausea or vomiting. 30 tablet 1  . oxybutynin (DITROPAN-XL) 5 MG 24 hr tablet Take 5 mg by mouth daily. 2 tabs once daily    . pantoprazole (PROTONIX) 40 MG tablet Take 40 mg by mouth 2 (two) times daily.      .Marland KitchenUNABLE TO FIND Allergy shots-Wednesday    . venlafaxine XR (EFFEXOR-XR) 75 MG 24 hr capsule Take 225 mg by mouth daily.  No current facility-administered medications on file prior to visit.    Allergies  Allergen Reactions  . Penicillins Rash    Has patient had a PCN reaction causing immediate rash, facial/tongue/throat swelling, SOB or lightheadedness with hypotension: {Yes Has patient had a PCN reaction causing severe rash involving mucus membranes or skin necrosis:unknown Has patient had a PCN reaction that required hospitalizationNo Has patient had a PCN reaction occurring within the last 10 years:No If all of the above answers are "NO", then may proceed with Cephalosporin use.    Family History  Problem Relation Age of Onset  . Diabetes Mother   . Hypertension Mother   . Breast cancer Mother 60       dx  again at 54; PALB2 and CHEK2 positive  . Diabetes Father   . Hypertension Father   . Coronary artery disease Father   . Hypertension Sister   . Breast cancer Sister        diagnosed in her 20's  . Thyroid cancer Maternal Uncle 46  . Lung cancer Maternal Grandmother   . Leukemia Maternal Grandfather 56       dx with hairy cell leukemia at 75; CLL at 48  . Lymphoma Maternal Grandfather 82       hodgkins lymphoma  . Brain cancer Paternal Grandmother   . Prostate cancer Paternal Grandfather   . Lung cancer Maternal Aunt 48  . Lymphoma Maternal Uncle 60       follicular lymphoma  . Allergic rhinitis Neg Hx   . Asthma Neg Hx    Social History   Socioeconomic History  . Marital status: Married    Spouse name: Not on file  . Number of children: 0  . Years of education: Not on file  . Highest education level: Not on file  Occupational History    Employer: [employer] Needs  . Financial resource strain: Not on file  . Food insecurity    Worry: Not on file    Inability: Not on file  . Transportation needs    Medical: Not on file    Non-medical: Not on file  Tobacco Use  . Smoking status: Former Smoker    Packs/day: 0.50    Years: 5.00    Pack years: 2.50    Types: Cigarettes    Quit date: 09/15/2017    Years since quitting: 1.6  . Smokeless tobacco: Never Used  Substance and Sexual Activity  . Alcohol use: No  . Drug use: No  . Sexual activity: Not Currently    Birth control/protection: Post-menopausal  Lifestyle  . Physical activity    Days per week: Not on file    Minutes per session: Not on file  . Stress: Not on file  Relationships  . Social Herbalist on phone: Not on file    Gets together: Not on file    Attends religious service: Not on file    Active member of club or organization: Not on file    Attends meetings of clubs or organizations: Not on file    Relationship status: Not on file  . Intimate partner violence    Fear of  current or ex partner: Not on file    Emotionally abused: Not on file    Physically abused: Not on file    Forced sexual activity: Not on file  Other Topics Concern  . Not on file  Social History Narrative  . Not on file  Review of Systems  See HPI, all other systems reviewed and are negative      Objective:   Physical Exam Blood pressure 134/84, pulse 89, temperature 98.4 F (36.9 C), height 5' 3"  (1.6 m), weight (!) 320 lb 12.8 oz (145.5 kg), last menstrual period 05/22/2015. BMI 56%  General: obese female in NAD Eyes: sclera non-icteric, conjunctiva pink Mouth: dentition intact, no ulcers, large tonsils 3-4. Neck: supple Heart: RRR, no murmur Lungs: clear throughout Abdomen: soft, nontender, + BS x 4 quads Extremities: no edema Neuro: alert and oriented x 4, no focal deficits      Assessment & Plan:   1. 48 y.o. female with chronic oropharyngeal dysphagia -Barium Swallow with tablet -EGD with Propofol, ANESTHESIA CONSULT REQUESTED, patient may need intubation for procedure secondary to BMI 56%, hx of obstructive sleep apnea, large tonsils at increased risk for respiratory complications during procedure -continue Protonix 84m once daily  2. CHECK2 mutation which associated with increased risk of  breast cancer but also colon cancer -request a copy of the patient's colonoscopy she reports was normal done < 5 years ago  3. Hx of an esophageal leiomyoma s/p resection 2008  Further follow up to be determined after the above evaluation completed   ADDENDUM 05/08/2019: I spoke to Anesthesiologist, Dr. NHilaria Otaand discussed patient's history, plans for EGD with concern of airway management during procedure due to morbid obesity, BMI 56%, obstructive sleep apnea, tonsils 3+, potentially would require intubation for EGD. Dr. [facility]are[facility] to be called at time of this appointment to assess patient. Patient is at high risk for  intubation for procedure and at risk of remaining intubated post procedure.

## 2019-05-05 ENCOUNTER — Encounter (INDEPENDENT_AMBULATORY_CARE_PROVIDER_SITE_OTHER): Payer: Self-pay | Admitting: Nurse Practitioner

## 2019-05-05 ENCOUNTER — Other Ambulatory Visit: Payer: Self-pay

## 2019-05-05 ENCOUNTER — Encounter (INDEPENDENT_AMBULATORY_CARE_PROVIDER_SITE_OTHER): Payer: Self-pay | Admitting: *Deleted

## 2019-05-05 ENCOUNTER — Ambulatory Visit (INDEPENDENT_AMBULATORY_CARE_PROVIDER_SITE_OTHER): Payer: BLUE CROSS/BLUE SHIELD | Admitting: Nurse Practitioner

## 2019-05-05 VITALS — BP 134/84 | HR 89 | Temp 98.4°F | Ht 63.0 in | Wt 320.8 lb

## 2019-05-05 DIAGNOSIS — R1312 Dysphagia, oropharyngeal phase: Secondary | ICD-10-CM

## 2019-05-05 DIAGNOSIS — R1314 Dysphagia, pharyngoesophageal phase: Secondary | ICD-10-CM | POA: Diagnosis not present

## 2019-05-05 NOTE — Patient Instructions (Signed)
1. Schedule a barium swallow with tablet, to be done at least one week before your EGD (upper endoscopy)  2. EGD to be scheduled. Anesthesia consult prior to EGD to be scheduled.  3. Follow up in our office 2 weeks after EGD completed  3. Drink 3 separate sips of water before swallowing any pill of food, cut food into small pieces and chew food thoroughly.  4. I will request a copy of your most recent colonoscopy from your primary doctor, you reported a colonoscopy was done less than 5 years ago

## 2019-05-06 ENCOUNTER — Encounter (INDEPENDENT_AMBULATORY_CARE_PROVIDER_SITE_OTHER): Payer: Self-pay | Admitting: *Deleted

## 2019-05-06 DIAGNOSIS — R1312 Dysphagia, oropharyngeal phase: Secondary | ICD-10-CM | POA: Insufficient documentation

## 2019-05-07 ENCOUNTER — Ambulatory Visit (INDEPENDENT_AMBULATORY_CARE_PROVIDER_SITE_OTHER): Payer: BLUE CROSS/BLUE SHIELD

## 2019-05-07 DIAGNOSIS — J309 Allergic rhinitis, unspecified: Secondary | ICD-10-CM

## 2019-05-08 ENCOUNTER — Telehealth (INDEPENDENT_AMBULATORY_CARE_PROVIDER_SITE_OTHER): Payer: Self-pay | Admitting: Nurse Practitioner

## 2019-05-08 NOTE — Telephone Encounter (Signed)
I spoke to Anesthesiologist, Dr. Hilaria Ota and discussed patient's history, plans for EGD with concern of airway management during procedure due to morbid obesity, BMI 56%, obstructive sleep apnea, tonsils 3+, potentially would require intubation for EGD. Dr. Hilaria Ota requested Pre Op appointment at [facility], anesthesiologist to be called at time of this appointment to assess patient. Patient is at high risk for intubation for procedure and at risk of remaining intubated post procedure.

## 2019-05-08 NOTE — Telephone Encounter (Signed)
I spoke to  Maudie Mercury and she has added a note to patient preop appt for nurses to call Dr Hilaria Ota at time of pre-op

## 2019-05-09 ENCOUNTER — Other Ambulatory Visit: Payer: Self-pay

## 2019-05-09 ENCOUNTER — Ambulatory Visit (HOSPITAL_COMMUNITY)
Admission: RE | Admit: 2019-05-09 | Discharge: 2019-05-09 | Disposition: A | Discharge status: Home / Self Care | Payer: BLUE CROSS/BLUE SHIELD | Source: Ambulatory Visit | Attending: Nurse Practitioner | Admitting: Nurse Practitioner

## 2019-05-09 DIAGNOSIS — R1312 Dysphagia, oropharyngeal phase: Secondary | ICD-10-CM | POA: Diagnosis present

## 2019-05-14 ENCOUNTER — Ambulatory Visit (INDEPENDENT_AMBULATORY_CARE_PROVIDER_SITE_OTHER): Payer: BLUE CROSS/BLUE SHIELD | Admitting: *Deleted

## 2019-05-14 DIAGNOSIS — J309 Allergic rhinitis, unspecified: Secondary | ICD-10-CM | POA: Diagnosis not present

## 2019-05-21 ENCOUNTER — Ambulatory Visit (INDEPENDENT_AMBULATORY_CARE_PROVIDER_SITE_OTHER): Payer: BLUE CROSS/BLUE SHIELD | Admitting: *Deleted

## 2019-05-21 DIAGNOSIS — J309 Allergic rhinitis, unspecified: Secondary | ICD-10-CM | POA: Diagnosis not present

## 2019-05-22 ENCOUNTER — Ambulatory Visit
Admission: RE | Admit: 2019-05-22 | Discharge: 2019-05-22 | Disposition: A | Discharge status: Home / Self Care | Payer: BLUE CROSS/BLUE SHIELD | Source: Ambulatory Visit | Attending: Hematology and Oncology | Admitting: Hematology and Oncology

## 2019-05-22 ENCOUNTER — Other Ambulatory Visit: Payer: Self-pay

## 2019-05-22 DIAGNOSIS — Z1231 Encounter for screening mammogram for malignant neoplasm of breast: Secondary | ICD-10-CM

## 2019-05-22 MED ORDER — GADOBUTROL 1 MMOL/ML IV SOLN
10.0000 mL | Freq: Once | INTRAVENOUS | Status: AC | PRN
  Administered 2019-05-22: 10 mL via INTRAVENOUS

## 2019-05-28 ENCOUNTER — Ambulatory Visit (INDEPENDENT_AMBULATORY_CARE_PROVIDER_SITE_OTHER): Payer: BLUE CROSS/BLUE SHIELD

## 2019-05-28 DIAGNOSIS — J309 Allergic rhinitis, unspecified: Secondary | ICD-10-CM

## 2019-06-04 ENCOUNTER — Ambulatory Visit (INDEPENDENT_AMBULATORY_CARE_PROVIDER_SITE_OTHER): Payer: BLUE CROSS/BLUE SHIELD | Admitting: *Deleted

## 2019-06-04 DIAGNOSIS — J309 Allergic rhinitis, unspecified: Secondary | ICD-10-CM | POA: Diagnosis not present

## 2019-06-09 ENCOUNTER — Encounter (HOSPITAL_COMMUNITY): Payer: Self-pay

## 2019-06-10 ENCOUNTER — Other Ambulatory Visit (HOSPITAL_COMMUNITY)
Admission: RE | Admit: 2019-06-10 | Discharge: 2019-06-10 | Disposition: A | Discharge status: Home / Self Care | Payer: BLUE CROSS/BLUE SHIELD | Source: Ambulatory Visit | Attending: Internal Medicine | Admitting: Internal Medicine

## 2019-06-10 ENCOUNTER — Other Ambulatory Visit: Payer: Self-pay

## 2019-06-10 ENCOUNTER — Encounter (HOSPITAL_COMMUNITY)
Admission: RE | Admit: 2019-06-10 | Discharge: 2019-06-10 | Disposition: A | Discharge status: Home / Self Care | Payer: BLUE CROSS/BLUE SHIELD | Source: Ambulatory Visit | Attending: Internal Medicine | Admitting: Internal Medicine

## 2019-06-10 DIAGNOSIS — Z20828 Contact with and (suspected) exposure to other viral communicable diseases: Secondary | ICD-10-CM | POA: Diagnosis not present

## 2019-06-10 DIAGNOSIS — Z01812 Encounter for preprocedural laboratory examination: Secondary | ICD-10-CM | POA: Diagnosis not present

## 2019-06-10 HISTORY — DX: Sleep apnea, unspecified: G47.30

## 2019-06-10 LAB — SARS CORONAVIRUS 2 (TAT 6-24 HRS): SARS Coronavirus 2: NEGATIVE

## 2019-06-12 ENCOUNTER — Other Ambulatory Visit: Payer: Self-pay

## 2019-06-12 ENCOUNTER — Telehealth: Payer: Self-pay | Admitting: Hematology

## 2019-06-12 MED ORDER — MONTELUKAST SODIUM 10 MG PO TABS: 10.0000 mg | ORAL_TABLET | Freq: Every day | ORAL | 5 refills | Status: DC

## 2019-06-12 NOTE — Telephone Encounter (Signed)
Patient called to see if her pharmacy sent a request for her singulair.  If not please send to [facility]

## 2019-06-12 NOTE — Telephone Encounter (Signed)
Pt is aware covid 19 test is negative °

## 2019-06-12 NOTE — Telephone Encounter (Signed)
Med sent.

## 2019-06-13 ENCOUNTER — Encounter (HOSPITAL_COMMUNITY): Payer: Self-pay | Admitting: *Deleted

## 2019-06-13 ENCOUNTER — Other Ambulatory Visit: Payer: Self-pay

## 2019-06-13 ENCOUNTER — Encounter (HOSPITAL_COMMUNITY)
Admission: RE | Disposition: A | Discharge status: Home / Self Care | Payer: Self-pay | Source: Home / Self Care | Attending: Internal Medicine

## 2019-06-13 ENCOUNTER — Ambulatory Visit (HOSPITAL_COMMUNITY): Payer: BLUE CROSS/BLUE SHIELD | Admitting: Anesthesiology

## 2019-06-13 ENCOUNTER — Ambulatory Visit (HOSPITAL_COMMUNITY)
Admission: RE | Admit: 2019-06-13 | Discharge: 2019-06-13 | Disposition: A | Discharge status: Home / Self Care | Payer: BLUE CROSS/BLUE SHIELD | Attending: Internal Medicine | Admitting: Internal Medicine

## 2019-06-13 ENCOUNTER — Ambulatory Visit (INDEPENDENT_AMBULATORY_CARE_PROVIDER_SITE_OTHER): Payer: BLUE CROSS/BLUE SHIELD | Admitting: *Deleted

## 2019-06-13 DIAGNOSIS — R1314 Dysphagia, pharyngoesophageal phase: Secondary | ICD-10-CM | POA: Insufficient documentation

## 2019-06-13 DIAGNOSIS — K228 Other specified diseases of esophagus: Secondary | ICD-10-CM

## 2019-06-13 DIAGNOSIS — Z87891 Personal history of nicotine dependence: Secondary | ICD-10-CM | POA: Insufficient documentation

## 2019-06-13 DIAGNOSIS — I10 Essential (primary) hypertension: Secondary | ICD-10-CM | POA: Diagnosis not present

## 2019-06-13 DIAGNOSIS — Z79899 Other long term (current) drug therapy: Secondary | ICD-10-CM | POA: Insufficient documentation

## 2019-06-13 DIAGNOSIS — K219 Gastro-esophageal reflux disease without esophagitis: Secondary | ICD-10-CM | POA: Diagnosis not present

## 2019-06-13 DIAGNOSIS — K3189 Other diseases of stomach and duodenum: Secondary | ICD-10-CM

## 2019-06-13 DIAGNOSIS — J309 Allergic rhinitis, unspecified: Secondary | ICD-10-CM | POA: Diagnosis not present

## 2019-06-13 DIAGNOSIS — G4730 Sleep apnea, unspecified: Secondary | ICD-10-CM | POA: Diagnosis not present

## 2019-06-13 DIAGNOSIS — R933 Abnormal findings on diagnostic imaging of other parts of digestive tract: Secondary | ICD-10-CM

## 2019-06-13 DIAGNOSIS — R1312 Dysphagia, oropharyngeal phase: Secondary | ICD-10-CM

## 2019-06-13 DIAGNOSIS — K449 Diaphragmatic hernia without obstruction or gangrene: Secondary | ICD-10-CM

## 2019-06-13 HISTORY — PX: ESOPHAGOGASTRODUODENOSCOPY (EGD) WITH PROPOFOL: SHX5813

## 2019-06-13 HISTORY — PX: MALONEY DILATION: SHX5535

## 2019-06-13 SURGERY — ESOPHAGOGASTRODUODENOSCOPY (EGD) WITH PROPOFOL
Anesthesia: General

## 2019-06-13 MED ORDER — PROPOFOL 10 MG/ML IV BOLUS
INTRAVENOUS | Status: DC | PRN
  Administered 2019-06-13 (×2): 20 mg via INTRAVENOUS

## 2019-06-13 MED ORDER — PROMETHAZINE HCL 25 MG/ML IJ SOLN: 6.2500 mg | INTRAMUSCULAR | Status: DC | PRN

## 2019-06-13 MED ORDER — HYDROCODONE-ACETAMINOPHEN 7.5-325 MG PO TABS: 1.0000 | ORAL_TABLET | Freq: Once | ORAL | Status: DC | PRN

## 2019-06-13 MED ORDER — CHLORHEXIDINE GLUCONATE CLOTH 2 % EX PADS: 6.0000 | MEDICATED_PAD | Freq: Once | CUTANEOUS | Status: DC

## 2019-06-13 MED ORDER — HYDROMORPHONE HCL 1 MG/ML IJ SOLN: 0.2500 mg | INTRAMUSCULAR | Status: DC | PRN

## 2019-06-13 MED ORDER — PROPOFOL 500 MG/50ML IV EMUL
INTRAVENOUS | Status: DC | PRN
  Administered 2019-06-13: 150 ug/kg/min via INTRAVENOUS

## 2019-06-13 MED ORDER — GLYCOPYRROLATE 0.2 MG/ML IJ SOLN
INTRAMUSCULAR | Status: DC | PRN
  Administered 2019-06-13: 0.2 mg via INTRAVENOUS

## 2019-06-13 MED ORDER — KETAMINE HCL 10 MG/ML IJ SOLN
INTRAMUSCULAR | Status: DC | PRN
  Administered 2019-06-13 (×2): 10 mg via INTRAVENOUS

## 2019-06-13 MED ORDER — LACTATED RINGERS IV SOLN
INTRAVENOUS | Status: DC
  Administered 2019-06-13: 1000 mL via INTRAVENOUS

## 2019-06-13 MED ORDER — KETAMINE HCL 50 MG/5ML IJ SOSY
PREFILLED_SYRINGE | INTRAMUSCULAR | Status: AC
  Filled 2019-06-13: qty 5

## 2019-06-13 MED ORDER — LIDOCAINE HCL 1 % IJ SOLN
INTRAMUSCULAR | Status: DC | PRN
  Administered 2019-06-13: 40 mg via INTRADERMAL

## 2019-06-13 MED ORDER — PROPOFOL 10 MG/ML IV BOLUS
INTRAVENOUS | Status: AC
  Filled 2019-06-13: qty 40

## 2019-06-13 MED ORDER — MIDAZOLAM HCL 2 MG/2ML IJ SOLN: 0.5000 mg | Freq: Once | INTRAMUSCULAR | Status: DC | PRN

## 2019-06-13 NOTE — Anesthesia Postprocedure Evaluation (Signed)
Anesthesia Post Note  Patient: Christine Mason  Procedure(s) Performed: ESOPHAGOGASTRODUODENOSCOPY (EGD) WITH PROPOFOL (N/A ) [place] DILATION  Patient location during evaluation: PACU Anesthesia Type: General Level of consciousness: awake and alert Pain management: pain level controlled Vital Signs Assessment: post-procedure vital signs reviewed and stable Respiratory status: spontaneous breathing Cardiovascular status: stable Postop Assessment: no apparent nausea or vomiting Anesthetic complications: no     Last Vitals:  Vitals:   06/13/19 0700 06/13/19 0715  BP: (!) 105/56 (!) 103/56  Resp:    Temp:    SpO2:      Last Pain:  Vitals:   06/13/19 0729  TempSrc:   PainSc: 0-No pain                 Kalliopi Coupland

## 2019-06-13 NOTE — Transfer of Care (Signed)
Immediate Anesthesia Transfer of Care Note  Patient: Christine Mason  Procedure(s) Performed: ESOPHAGOGASTRODUODENOSCOPY (EGD) WITH PROPOFOL (N/A ) [facility] DILATION  Patient Location: PACU  Anesthesia Type:General  Level of Consciousness: awake  Airway & Oxygen Therapy: Patient Spontanous Breathing  Post-op Assessment: Report given to RN  Post vital signs: Reviewed  Last Vitals:  Vitals Value Taken Time  BP 110/44 06/13/19 0747  Temp    Pulse 78 06/13/19 0749  Resp 13 06/13/19 0749  SpO2 95 % 06/13/19 0749  Vitals shown include unvalidated device data.  Last Pain:  Vitals:   06/13/19 0729  TempSrc:   PainSc: 0-No pain      Patients Stated Pain Goal: 8 ([id number])  Complications: No apparent anesthesia complications

## 2019-06-13 NOTE — Anesthesia Preprocedure Evaluation (Signed)
Anesthesia Evaluation  Patient identified by MRN, date of birth, ID band Patient awake    Reviewed: Allergy & Precautions, NPO status , Patient's Chart, lab work & pertinent test results, reviewed documented beta blocker date and time   Airway Mallampati: III  TM Distance: >3 FB Neck ROM: Full    Dental no notable dental hx. (+) Teeth Intact   Pulmonary sleep apnea , former smoker,  States doesn't use CPAP Reports enviornmental allergies on daily singulair  States last rescue inhalers were over a year ago   Pulmonary exam normal breath sounds clear to auscultation       Cardiovascular Exercise Tolerance: Good hypertension, Pt. on medications and Pt. on home beta blockers + DOE  Normal cardiovascular examI Rhythm:Regular Rate:Normal  Reports DOE which she attributes to weight  Denies CP or known cardiac issues    Neuro/Psych PSYCHIATRIC DISORDERS Depression negative neurological ROS     GI/Hepatic Neg liver ROS, GERD  Medicated and Controlled,  Endo/Other  Morbid obesityBMI >53-SMO  Renal/GU negative Renal ROS  negative genitourinary   Musculoskeletal negative musculoskeletal ROS (+)   Abdominal   Peds negative pediatric ROS (+)  Hematology negative hematology ROS (+)   Anesthesia Other Findings   Reproductive/Obstetrics negative OB ROS                             Anesthesia Physical Anesthesia Plan  ASA: IV  Anesthesia Plan: General   Post-op Pain Management:    Induction: Intravenous  PONV Risk Score and Plan: 3 and TIVA, Propofol infusion, Ondansetron and Treatment may vary due to age or medical condition  Airway Management Planned: Nasal Cannula and Simple Face Mask  Additional Equipment:   Intra-op Plan:   Post-operative Plan:   Informed Consent: I have reviewed the patients History and Physical, chart, labs and discussed the procedure including the risks, benefits  and alternatives for the proposed anesthesia with the patient or authorized representative who has indicated his/her understanding and acceptance.     Dental advisory given  Plan Discussed with: CRNA  Anesthesia Plan Comments: (Plan Full PPE use  Plan GA with GETA as needed d/w pt -WTP with same after Q&A)        Anesthesia Quick Evaluation

## 2019-06-13 NOTE — Discharge Instructions (Signed)
Resume usual medications and diet as before. Remember to take small bites and chew food thoroughly and eat slowly. No driving for 24 hours. Please call office with progress report next week.   Monitored Anesthesia Care, Care After These instructions provide you with information about caring for yourself after your procedure. Your health care provider may also give you more specific instructions. Your treatment has been planned according to current medical practices, but problems sometimes occur. Call your health care provider if you have any problems or questions after your procedure. What can I expect after the procedure? After your procedure, you may:  Feel sleepy for several hours.  Feel clumsy and have poor balance for several hours.  Feel forgetful about what happened after the procedure.  Have poor judgment for several hours.  Feel nauseous or vomit.  Have a sore throat if you had a breathing tube during the procedure. Follow these instructions at home: For at least 24 hours after the procedure:      Have a responsible adult stay with you. It is important to have someone help care for you until you are awake and alert.  Rest as needed.  Do not: ? Participate in activities in which you could fall or become injured. ? Drive. ? Use heavy machinery. ? Drink alcohol. ? Take sleeping pills or medicines that cause drowsiness. ? Make important decisions or sign legal documents. ? Take care of children on your own. Eating and drinking  Follow the diet that is recommended by your health care provider.  If you vomit, drink water, juice, or soup when you can drink without vomiting.  Make sure you have little or no nausea before eating solid foods. General instructions  Take over-the-counter and prescription medicines only as told by your health care provider.  If you have sleep apnea, surgery and certain medicines can increase your risk for breathing problems. Follow  instructions from your health care provider about wearing your sleep device: ? Anytime you are sleeping, including during daytime naps. ? While taking prescription pain medicines, sleeping medicines, or medicines that make you drowsy.  If you smoke, do not smoke without supervision.  Keep all follow-up visits as told by your health care provider. This is important. Contact a health care provider if:  You keep feeling nauseous or you keep vomiting.  You feel light-headed.  You develop a rash.  You have a fever. Get help right away if:  You have trouble breathing. Summary  For several hours after your procedure, you may feel sleepy and have poor judgment.  Have a responsible adult stay with you for at least 24 hours or until you are awake and alert. This information is not intended to replace advice given to you by your health care provider. Make sure you discuss any questions you have with your health care provider. Document Released: 12/26/2015 Document Revised: 12/03/2017 Document Reviewed: 12/26/2015 Elsevier Patient Education  2020 [employer].

## 2019-06-13 NOTE — Op Note (Signed)
[facility] Patient Name: Christine Mason Procedure Date: 06/13/2019 7:08 AM MRN: [id number] Date of Birth: [birth year] Attending MD: Hildred Laser , MD CSN: [id number] Age: 48 Admit Type: Outpatient Procedure:                Upper GI endoscopy Indications:              Esophageal dysphagia, Abnormal cine-esophagram Providers:                Hildred Laser, MD, Otis Peak B. Sharon Seller, RN, Aram Candela Referring MD:             Delphina Cahill, MD Medicines:                Propofol per Anesthesia Complications:            No immediate complications. Estimated Blood Loss:     Estimated blood loss: none. Procedure:                Pre-Anesthesia Assessment:                           - Prior to the procedure, a History and Physical                            was performed, and patient medications and                            allergies were reviewed. The patient's tolerance of                            previous anesthesia was also reviewed. The risks                            and benefits of the procedure and the sedation                            options and risks were discussed with the patient.                            All questions were answered, and informed consent                            was obtained. Prior Anticoagulants: The patient has                            taken no previous anticoagulant or antiplatelet                            agents. ASA Grade Assessment: III - A patient with                            severe systemic disease. After reviewing the risks  and benefits, the patient was deemed in                            satisfactory condition to undergo the procedure.                           After obtaining informed consent, the endoscope was                            passed under direct vision. Throughout the                            procedure, the patient's blood pressure, pulse, and                             oxygen saturations were monitored continuously. The                            [id number]) scope was introduced through the                            mouth, and advanced to the second part of duodenum.                            The upper GI endoscopy was accomplished without                            difficulty. The patient tolerated the procedure                            well. Scope In: [id number] AM Scope Out: 7:41:10 AM Total Procedure Duration: 0 hours 6 minutes 52 seconds  Findings:      The examined esophagus was normal.      The Z-line was irregular and was found 36 cm from the incisors.      A 2 cm hiatal hernia was present.      No endoscopic abnormality was evident in the esophagus to explain the       patient's complaint of dysphagia. It was decided, however, to proceed       with dilation of the entire esophagus. The scope was withdrawn. Dilation       was performed with a Maloney dilator with no resistance at 20 Fr. The       dilation site was examined following endoscope reinsertion and showed no       change and no bleeding, mucosal tear or perforation.      Patchy mildly erythematous mucosa without bleeding was found in the       gastric antrum.      The exam of the stomach was otherwise normal.      The duodenal bulb and second portion of the duodenum were normal. Impression:               - Normal esophagus.                           - Z-line irregular, 36 cm from the incisors.                           -  2 cm hiatal hernia.                           - No endoscopic esophageal abnormality to explain                            patient's dysphagia. Esophagus dilated. Dilated.                           - Erythematous mucosa in the antrum.                           - Normal duodenal bulb and second portion of the                            duodenum.                           - No specimens collected. Moderate Sedation:      Per Anesthesia Care Recommendation:            - Patient has a contact number available for                            emergencies. The signs and symptoms of potential                            delayed complications were discussed with the                            patient. Return to normal activities tomorrow.                            Written discharge instructions were provided to the                            patient.                           - Resume previous diet today.                           - Continue present medications.                           - H. Pylori serology.                           - Telephone GI clinic in 1 week. Procedure Code(s):        --- Professional ---                           [id number], Esophagogastroduodenoscopy, flexible,                            transoral; diagnostic, including collection of  specimen(s) by brushing or washing, when performed                            (separate procedure)                           43450, Dilation of esophagus, by unguided sound or                            bougie, single or multiple passes Diagnosis Code(s):        --- Professional ---                           K22.8, Other specified diseases of esophagus                           K44.9, Diaphragmatic hernia without obstruction or                            gangrene                           K31.89, Other diseases of stomach and duodenum                           R13.14, Dysphagia, pharyngoesophageal phase                           R93.3, Abnormal findings on diagnostic imaging of                            other parts of digestive tract CPT copyright 2019 American [employer] Association. All rights reserved. The codes documented in this report are preliminary and upon coder review may  be revised to meet current compliance requirements. Hildred Laser, MD Hildred Laser, MD 06/13/2019 7:51:04 AM This report has been signed electronically. Number of Addenda: 0
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
## 2019-06-13 NOTE — Addendum Note (Signed)
Addendum  created 06/13/19 0904 by Ollen Bowl, CRNA   Intraprocedure Meds edited

## 2019-06-16 LAB — H. PYLORI ANTIBODY, IGG: H Pylori IgG: 0.5 Index Value (ref 0.00–0.79)

## 2019-06-18 ENCOUNTER — Encounter (HOSPITAL_COMMUNITY): Payer: Self-pay | Admitting: Internal Medicine

## 2019-06-18 ENCOUNTER — Ambulatory Visit (INDEPENDENT_AMBULATORY_CARE_PROVIDER_SITE_OTHER): Payer: BLUE CROSS/BLUE SHIELD

## 2019-06-18 DIAGNOSIS — J309 Allergic rhinitis, unspecified: Secondary | ICD-10-CM

## 2019-06-25 ENCOUNTER — Ambulatory Visit (INDEPENDENT_AMBULATORY_CARE_PROVIDER_SITE_OTHER): Payer: BLUE CROSS/BLUE SHIELD

## 2019-06-25 DIAGNOSIS — J309 Allergic rhinitis, unspecified: Secondary | ICD-10-CM

## 2019-07-02 ENCOUNTER — Ambulatory Visit (INDEPENDENT_AMBULATORY_CARE_PROVIDER_SITE_OTHER): Payer: BLUE CROSS/BLUE SHIELD

## 2019-07-02 DIAGNOSIS — J309 Allergic rhinitis, unspecified: Secondary | ICD-10-CM | POA: Diagnosis not present

## 2019-07-09 ENCOUNTER — Ambulatory Visit (INDEPENDENT_AMBULATORY_CARE_PROVIDER_SITE_OTHER): Payer: BLUE CROSS/BLUE SHIELD

## 2019-07-09 DIAGNOSIS — J309 Allergic rhinitis, unspecified: Secondary | ICD-10-CM | POA: Diagnosis not present

## 2019-07-16 ENCOUNTER — Telehealth: Payer: Self-pay

## 2019-07-16 NOTE — Telephone Encounter (Signed)
Patient reports low grade fevers over the last few weeks. Her home temperature was reported as 100.2 this morning. Just today she finished a round of azithromycin for fever and sore throat and is not feeling any better. She reports her last COVID testing was negative, however, this was about 3 weeks ago. Patient advised to return to her PCP and repeat COVID testing. She will return for allergen immunotherapy when fever free and feeling well.

## 2019-07-16 NOTE — Telephone Encounter (Signed)
Patient called stating she has been having some low grade fevers for the past few weeks. She isn't having fevers every day. She has been covid tested about a month ago and it was negative. She has been placed on antibiotics by her PCP. She is wondering can she spike fevers with her allergy injections? Patient is due back for an injection today. I told her to hold off until she hears back from a nurse.  Please Advise.

## 2019-07-23 ENCOUNTER — Ambulatory Visit (INDEPENDENT_AMBULATORY_CARE_PROVIDER_SITE_OTHER): Payer: BLUE CROSS/BLUE SHIELD

## 2019-07-23 DIAGNOSIS — J309 Allergic rhinitis, unspecified: Secondary | ICD-10-CM

## 2019-07-30 ENCOUNTER — Ambulatory Visit (INDEPENDENT_AMBULATORY_CARE_PROVIDER_SITE_OTHER): Payer: BLUE CROSS/BLUE SHIELD

## 2019-07-30 DIAGNOSIS — J309 Allergic rhinitis, unspecified: Secondary | ICD-10-CM

## 2019-08-06 ENCOUNTER — Ambulatory Visit (INDEPENDENT_AMBULATORY_CARE_PROVIDER_SITE_OTHER): Payer: BLUE CROSS/BLUE SHIELD

## 2019-08-06 DIAGNOSIS — J309 Allergic rhinitis, unspecified: Secondary | ICD-10-CM

## 2019-08-13 ENCOUNTER — Ambulatory Visit (INDEPENDENT_AMBULATORY_CARE_PROVIDER_SITE_OTHER): Payer: BLUE CROSS/BLUE SHIELD

## 2019-08-13 DIAGNOSIS — J309 Allergic rhinitis, unspecified: Secondary | ICD-10-CM | POA: Diagnosis not present

## 2019-08-20 ENCOUNTER — Ambulatory Visit (INDEPENDENT_AMBULATORY_CARE_PROVIDER_SITE_OTHER): Payer: BLUE CROSS/BLUE SHIELD

## 2019-08-20 DIAGNOSIS — J309 Allergic rhinitis, unspecified: Secondary | ICD-10-CM

## 2019-08-25 ENCOUNTER — Other Ambulatory Visit (HOSPITAL_COMMUNITY): Payer: Self-pay | Admitting: Hematology and Oncology

## 2019-08-25 DIAGNOSIS — Z1231 Encounter for screening mammogram for malignant neoplasm of breast: Secondary | ICD-10-CM

## 2019-08-27 ENCOUNTER — Ambulatory Visit (INDEPENDENT_AMBULATORY_CARE_PROVIDER_SITE_OTHER): Payer: BLUE CROSS/BLUE SHIELD | Admitting: *Deleted

## 2019-08-27 DIAGNOSIS — J309 Allergic rhinitis, unspecified: Secondary | ICD-10-CM | POA: Diagnosis not present

## 2019-09-01 ENCOUNTER — Other Ambulatory Visit: Payer: Self-pay

## 2019-09-01 ENCOUNTER — Ambulatory Visit (HOSPITAL_COMMUNITY)
Admission: RE | Admit: 2019-09-01 | Discharge: 2019-09-01 | Disposition: A | Discharge status: Home / Self Care | Payer: BLUE CROSS/BLUE SHIELD | Source: Ambulatory Visit | Attending: Hematology and Oncology | Admitting: Hematology and Oncology

## 2019-09-01 DIAGNOSIS — Z1231 Encounter for screening mammogram for malignant neoplasm of breast: Secondary | ICD-10-CM | POA: Diagnosis not present

## 2019-09-03 ENCOUNTER — Other Ambulatory Visit (HOSPITAL_COMMUNITY): Payer: Self-pay | Admitting: Hematology and Oncology

## 2019-09-03 ENCOUNTER — Other Ambulatory Visit: Payer: Self-pay | Admitting: *Deleted

## 2019-09-03 ENCOUNTER — Telehealth: Payer: Self-pay | Admitting: *Deleted

## 2019-09-03 DIAGNOSIS — R928 Other abnormal and inconclusive findings on diagnostic imaging of breast: Secondary | ICD-10-CM

## 2019-09-03 NOTE — Telephone Encounter (Signed)
Received a call from pt regarding mammogram that was preformed this week at [facility].  States mammogram showed possible left breast mass and is concerned it may be cancer.  RN expressed to pt that the [facility] Radiologist ordered an ultrasound and a diagnostic breast mammogram to be done on 09/09/2019.  RN educated pt this will give the radiologist a better idea of what the mass may be.  If needed, the radiologist at [facility] will order a Biopsy to be preformed.  Pt requested RN to call [facility] to see if they can see the pt any sooner.  RN placed call to [facility] but they are not able to schedule any patients until the beginning of January.  Pt states she will keep the apt at [facility] and will follow up with Dr. Lindi Adie on 12/29 to review results.

## 2019-09-05 ENCOUNTER — Ambulatory Visit (INDEPENDENT_AMBULATORY_CARE_PROVIDER_SITE_OTHER): Payer: BLUE CROSS/BLUE SHIELD

## 2019-09-05 DIAGNOSIS — J309 Allergic rhinitis, unspecified: Secondary | ICD-10-CM

## 2019-09-09 ENCOUNTER — Ambulatory Visit (HOSPITAL_COMMUNITY)
Admission: RE | Admit: 2019-09-09 | Discharge: 2019-09-09 | Disposition: A | Discharge status: Home / Self Care | Payer: BLUE CROSS/BLUE SHIELD | Source: Ambulatory Visit | Attending: Hematology and Oncology | Admitting: Hematology and Oncology

## 2019-09-09 ENCOUNTER — Other Ambulatory Visit: Payer: Self-pay

## 2019-09-09 DIAGNOSIS — R928 Other abnormal and inconclusive findings on diagnostic imaging of breast: Secondary | ICD-10-CM | POA: Insufficient documentation

## 2019-09-10 ENCOUNTER — Other Ambulatory Visit: Payer: Self-pay

## 2019-09-10 ENCOUNTER — Ambulatory Visit (INDEPENDENT_AMBULATORY_CARE_PROVIDER_SITE_OTHER): Payer: BLUE CROSS/BLUE SHIELD

## 2019-09-10 DIAGNOSIS — J309 Allergic rhinitis, unspecified: Secondary | ICD-10-CM

## 2019-09-10 DIAGNOSIS — Z1501 Genetic susceptibility to malignant neoplasm of breast: Secondary | ICD-10-CM

## 2019-09-10 DIAGNOSIS — Z1231 Encounter for screening mammogram for malignant neoplasm of breast: Secondary | ICD-10-CM

## 2019-09-16 ENCOUNTER — Ambulatory Visit: Payer: BLUE CROSS/BLUE SHIELD | Admitting: Hematology and Oncology

## 2019-09-17 ENCOUNTER — Ambulatory Visit (INDEPENDENT_AMBULATORY_CARE_PROVIDER_SITE_OTHER): Payer: BLUE CROSS/BLUE SHIELD

## 2019-09-17 DIAGNOSIS — J309 Allergic rhinitis, unspecified: Secondary | ICD-10-CM | POA: Diagnosis not present

## 2019-09-23 ENCOUNTER — Encounter (HOSPITAL_COMMUNITY): Payer: BLUE CROSS/BLUE SHIELD

## 2019-09-24 ENCOUNTER — Ambulatory Visit (INDEPENDENT_AMBULATORY_CARE_PROVIDER_SITE_OTHER): Payer: BLUE CROSS/BLUE SHIELD

## 2019-09-24 DIAGNOSIS — J309 Allergic rhinitis, unspecified: Secondary | ICD-10-CM | POA: Diagnosis not present

## 2019-10-01 ENCOUNTER — Ambulatory Visit (INDEPENDENT_AMBULATORY_CARE_PROVIDER_SITE_OTHER): Payer: BLUE CROSS/BLUE SHIELD

## 2019-10-01 DIAGNOSIS — J309 Allergic rhinitis, unspecified: Secondary | ICD-10-CM

## 2019-10-06 ENCOUNTER — Telehealth: Payer: Self-pay

## 2019-10-06 NOTE — Telephone Encounter (Signed)
Pt states that she has tried alcohol, calamine lotion, this has not helped.  Pt states that she has taken xyzal and Singulair.  Pt states that she received the vaccine September 28, 2019.  Pt denied any breathing issues or any fever, she has reported her side effects to the daily vaccine clinic.  Recommend that patient increase her antihistamine xyzal to help with itching and try some hydrocortisone cream/ointment. Pt was okay with this plan.   Pt is asking if she could still get her allergy injection this week Friday.    Please advise:  Would you recommend anything else and can she get her allergy injection this week?

## 2019-10-06 NOTE — Telephone Encounter (Signed)
Call to patient, no answer, left message to call clinic back.

## 2019-10-06 NOTE — Telephone Encounter (Signed)
Patient called stating that she received her COVID vaccine a week and a half ago and that one week after the vaccine she started to have redness and itching at the injection site. She verbalizes that is is driving her crazy. She denies any pain. She is wondering if this could be considered an allergic reaction to the vaccine. Please advise.

## 2019-10-07 MED ORDER — CLOBETASOL PROPIONATE 0.05 % EX OINT: 1.0000 "application " | TOPICAL_OINTMENT | Freq: Two times a day (BID) | CUTANEOUS | 0 refills | Status: DC

## 2019-10-07 NOTE — Telephone Encounter (Signed)
Call to patient, today she is doing better.  She would prefer the topical ointment clobetasol.  Ointment has been sent in to the pharmacy.  Pt will keep her shot appointment on Friday so Dr Ernst Bowler can take a look at her arm.

## 2019-10-07 NOTE — Addendum Note (Signed)
Addended by: Neomia Dear on: 10/07/2019 09:40 AM   Modules accepted: Orders

## 2019-10-07 NOTE — Telephone Encounter (Signed)
We are actually seeing this a lot, especially with the Moderna vaccine. This is not an IgE mediated reaction (i.e. 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 We could consider patch testing in the future if needed to clarify this more.   If needed, we can add on a low dose prednisone to see if this helps (10mg  BID for three days). This can blunt her immune reaction to the vaccine and decrease the effectiveness, so if she can just continue with the topical treatments and antihistamines, that would probably be better. We could add on a topical steroid such as clobetasol to use for a limited amount of time (two weeks or so), which would not have the side effects of the prednisone. I would prefer that.   She can still get her shot this week - that will allow me to take a look at it in person, too!   Salvatore Marvel, MD Allergy and [place] [facility] [place]

## 2019-10-08 ENCOUNTER — Ambulatory Visit: Payer: Self-pay

## 2019-10-08 ENCOUNTER — Ambulatory Visit: Payer: BLUE CROSS/BLUE SHIELD | Admitting: Allergy & Immunology

## 2019-10-10 ENCOUNTER — Ambulatory Visit (INDEPENDENT_AMBULATORY_CARE_PROVIDER_SITE_OTHER): Payer: BLUE CROSS/BLUE SHIELD

## 2019-10-10 DIAGNOSIS — J309 Allergic rhinitis, unspecified: Secondary | ICD-10-CM | POA: Diagnosis not present

## 2019-10-17 ENCOUNTER — Ambulatory Visit (INDEPENDENT_AMBULATORY_CARE_PROVIDER_SITE_OTHER): Payer: BLUE CROSS/BLUE SHIELD | Admitting: Allergy & Immunology

## 2019-10-17 ENCOUNTER — Other Ambulatory Visit: Payer: Self-pay

## 2019-10-17 ENCOUNTER — Encounter: Payer: Self-pay | Admitting: Allergy & Immunology

## 2019-10-17 ENCOUNTER — Ambulatory Visit (INDEPENDENT_AMBULATORY_CARE_PROVIDER_SITE_OTHER): Payer: BLUE CROSS/BLUE SHIELD

## 2019-10-17 VITALS — BP 118/88 | HR 89 | Temp 98.4°F | Resp 18

## 2019-10-17 DIAGNOSIS — H6123 Impacted cerumen, bilateral: Secondary | ICD-10-CM

## 2019-10-17 DIAGNOSIS — J3089 Other allergic rhinitis: Secondary | ICD-10-CM

## 2019-10-17 DIAGNOSIS — L299 Pruritus, unspecified: Secondary | ICD-10-CM | POA: Diagnosis not present

## 2019-10-17 DIAGNOSIS — R1310 Dysphagia, unspecified: Secondary | ICD-10-CM | POA: Diagnosis not present

## 2019-10-17 DIAGNOSIS — J309 Allergic rhinitis, unspecified: Secondary | ICD-10-CM | POA: Diagnosis not present

## 2019-10-17 DIAGNOSIS — J302 Other seasonal allergic rhinitis: Secondary | ICD-10-CM

## 2019-10-17 MED ORDER — MONTELUKAST SODIUM 10 MG PO TABS: 10.0000 mg | ORAL_TABLET | Freq: Every day | ORAL | 5 refills | Status: DC

## 2019-10-17 NOTE — Patient Instructions (Addendum)
1. Itching with P/SAR (grasses, ragweed, weeds, indoor molds, dust mites and cat) - We are going to continue with allergy shots at the same schedule.  - Continue taking: Singulair (montelukast) 10mg  daily and Xyzal (levocetirizine) 5mg  1-2 times daily and Nasacort one spray per nostril as needed. - You can use an extra dose of the antihistamine, if needed, for breakthrough symptoms.  - Be sure to moisturize since moist skin is less itchy than dry skin. - Avoid lotions with fragrances and dyes.   2. Dysphagia (difficulty swallowing) - Continue with Protonix twice daily.   3. Cerumen impaction (ear wax) - Cerumen removed from both ear canals today. - Ear canals rinsed with warm water. - Consider using a few drops of olive oil a few times a week to keep the canals lubricated to allow wax to come out on its own.   4. Return in about 6 months (around 04/15/2020). This can be an in-person, a virtual Webex or a telephone follow up visit.   Please inform us of any Emergency Department visits, hospitalizations, or changes in symptoms. Call us before going to the ED for breathing or allergy symptoms since we might be able to fit you in for a sick visit. Feel free to contact us anytime with any questions, problems, or concerns.  It was a pleasure to see you again today! Good luck with the car!   Websites that have reliable patient information: 1. American Academy of Asthma, Allergy, and Immunology: www.aaaai.org 2. Food Allergy Research and Education (FARE): foodallergy.org 3. Mothers of Asthmatics: http://www.asthmacommunitynetwork.org 4. American College of Allergy, Asthma, and Immunology: www.acaai.org   COVID-19 Vaccine Information can be found at: ShippingScam.co.uk For questions related to vaccine distribution or appointments, please email [email or web address] or call [phone].     "Like" [place] on Facebook and Instagram for our  latest updates!        Make sure you are registered to vote! If you have moved or changed any of your contact information, you will need to get this updated before voting!  In some cases, you MAY be able to register to vote online: [email or web address]

## 2019-10-17 NOTE — Progress Notes (Signed)
Patient had bilateral ear wax removal with the use of warm water. Patient tolerated procedure well without any complaints of pain during the removal. Patient had no signs of symptoms of dizziness, ear pain or trouble hearing. Patient was advised to take pain medication incase of any pain later on in the day.
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
## 2019-10-22 ENCOUNTER — Ambulatory Visit (INDEPENDENT_AMBULATORY_CARE_PROVIDER_SITE_OTHER): Payer: BLUE CROSS/BLUE SHIELD

## 2019-10-22 DIAGNOSIS — J309 Allergic rhinitis, unspecified: Secondary | ICD-10-CM

## 2019-10-29 ENCOUNTER — Ambulatory Visit (INDEPENDENT_AMBULATORY_CARE_PROVIDER_SITE_OTHER): Payer: BLUE CROSS/BLUE SHIELD

## 2019-10-29 DIAGNOSIS — J309 Allergic rhinitis, unspecified: Secondary | ICD-10-CM | POA: Diagnosis not present

## 2019-11-05 ENCOUNTER — Ambulatory Visit (INDEPENDENT_AMBULATORY_CARE_PROVIDER_SITE_OTHER): Payer: BLUE CROSS/BLUE SHIELD

## 2019-11-05 DIAGNOSIS — J309 Allergic rhinitis, unspecified: Secondary | ICD-10-CM | POA: Diagnosis not present

## 2019-11-12 ENCOUNTER — Telehealth: Payer: Self-pay

## 2019-11-12 NOTE — Telephone Encounter (Signed)
Patient call and was wondering about her allergy injections. Patient stated that next week she starts a new job from [place]. I informed patient that we start allergy injections at 8:30 and if she was at the office by 830 she would be one of the first ones to receive her injection and get to work on time. Patient will try this and if it doesn't work then patient may take vials out to her PCP to get injections on Saturday.

## 2019-11-19 ENCOUNTER — Ambulatory Visit (INDEPENDENT_AMBULATORY_CARE_PROVIDER_SITE_OTHER): Payer: BLUE CROSS/BLUE SHIELD

## 2019-11-19 DIAGNOSIS — J309 Allergic rhinitis, unspecified: Secondary | ICD-10-CM

## 2019-11-27 DIAGNOSIS — J3089 Other allergic rhinitis: Secondary | ICD-10-CM

## 2019-11-27 NOTE — Progress Notes (Signed)
EXP 11/26/20

## 2019-11-28 ENCOUNTER — Ambulatory Visit (INDEPENDENT_AMBULATORY_CARE_PROVIDER_SITE_OTHER): Payer: BLUE CROSS/BLUE SHIELD

## 2019-11-28 DIAGNOSIS — J309 Allergic rhinitis, unspecified: Secondary | ICD-10-CM

## 2019-12-03 ENCOUNTER — Ambulatory Visit (INDEPENDENT_AMBULATORY_CARE_PROVIDER_SITE_OTHER): Payer: BLUE CROSS/BLUE SHIELD

## 2019-12-03 DIAGNOSIS — J309 Allergic rhinitis, unspecified: Secondary | ICD-10-CM | POA: Diagnosis not present

## 2019-12-12 ENCOUNTER — Ambulatory Visit (INDEPENDENT_AMBULATORY_CARE_PROVIDER_SITE_OTHER): Payer: BLUE CROSS/BLUE SHIELD

## 2019-12-12 DIAGNOSIS — J309 Allergic rhinitis, unspecified: Secondary | ICD-10-CM

## 2019-12-17 ENCOUNTER — Ambulatory Visit (INDEPENDENT_AMBULATORY_CARE_PROVIDER_SITE_OTHER): Payer: BLUE CROSS/BLUE SHIELD

## 2019-12-17 DIAGNOSIS — J309 Allergic rhinitis, unspecified: Secondary | ICD-10-CM | POA: Diagnosis not present

## 2019-12-24 ENCOUNTER — Ambulatory Visit (INDEPENDENT_AMBULATORY_CARE_PROVIDER_SITE_OTHER): Payer: BLUE CROSS/BLUE SHIELD

## 2019-12-24 DIAGNOSIS — J309 Allergic rhinitis, unspecified: Secondary | ICD-10-CM | POA: Diagnosis not present

## 2019-12-31 ENCOUNTER — Ambulatory Visit (INDEPENDENT_AMBULATORY_CARE_PROVIDER_SITE_OTHER): Payer: BLUE CROSS/BLUE SHIELD

## 2019-12-31 DIAGNOSIS — J309 Allergic rhinitis, unspecified: Secondary | ICD-10-CM | POA: Diagnosis not present

## 2020-01-07 ENCOUNTER — Ambulatory Visit (INDEPENDENT_AMBULATORY_CARE_PROVIDER_SITE_OTHER): Payer: BLUE CROSS/BLUE SHIELD

## 2020-01-07 DIAGNOSIS — J309 Allergic rhinitis, unspecified: Secondary | ICD-10-CM

## 2020-01-14 ENCOUNTER — Ambulatory Visit (INDEPENDENT_AMBULATORY_CARE_PROVIDER_SITE_OTHER): Payer: BLUE CROSS/BLUE SHIELD

## 2020-01-14 DIAGNOSIS — J309 Allergic rhinitis, unspecified: Secondary | ICD-10-CM

## 2020-01-15 ENCOUNTER — Encounter: Payer: Self-pay | Admitting: Emergency Medicine

## 2020-01-15 ENCOUNTER — Other Ambulatory Visit: Payer: Self-pay

## 2020-01-15 ENCOUNTER — Ambulatory Visit
Admission: EM | Admit: 2020-01-15 | Discharge: 2020-01-15 | Disposition: A | Discharge status: Home / Self Care | Payer: BLUE CROSS/BLUE SHIELD | Attending: Emergency Medicine | Admitting: Emergency Medicine

## 2020-01-15 DIAGNOSIS — Z1152 Encounter for screening for COVID-19: Secondary | ICD-10-CM | POA: Diagnosis not present

## 2020-01-15 DIAGNOSIS — M545 Low back pain, unspecified: Secondary | ICD-10-CM

## 2020-01-15 DIAGNOSIS — R5383 Other fatigue: Secondary | ICD-10-CM | POA: Diagnosis not present

## 2020-01-15 MED ORDER — IBUPROFEN 600 MG PO TABS
600.0000 mg | ORAL_TABLET | Freq: Four times a day (QID) | ORAL | 0 refills | Status: DC | PRN
Start: 2020-01-15 — End: 2020-04-16

## 2020-01-15 MED ORDER — PREDNISONE 10 MG PO TABS
20.0000 mg | ORAL_TABLET | Freq: Every day | ORAL | 0 refills | Status: DC
Start: 2020-01-15 — End: 2020-04-16

## 2020-01-15 NOTE — ED Provider Notes (Signed)
[facility]    CSN: [id number] Arrival date & time: 01/15/20  1733      History   Chief Complaint Chief Complaint  Patient presents with  . Fatigue    HPI Christine Mason is a 49 y.o. female.    who presented to the urgent care with a complaint of fatigue, bacterial fever and back pain for the past 3 days.  Denies any precipitating event.  Denies closure to Covid flu or strep. Denies recent travel.  Denies aggravating or alleviating symptoms.  Denies previous COVID infection.   Denies  chills, nasal congestion, rhinorrhea, sore throat, cough, SOB, wheezing, chest pain, nausea, vomiting, changes in bowel or bladder habits.        The history is provided by the patient. No language interpreter was used.    Past Medical History:  Diagnosis Date  . Chronic leg pain    right  . Chronic pain of left knee   . Depression   . Diverticulitis   . GERD (gastroesophageal reflux disease)   . Hypertension   . Sleep apnea     Patient Active Problem List   Diagnosis Date Noted  . Oropharyngeal dysphagia 05/06/2019  . Dysphagia, pharyngoesophageal phase 05/05/2019  . Vasomotor symptoms due to menopause 03/13/2019  . Genetic testing 06/03/2015  . 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 ACNE CYST REMOVAL    . CHOLECYSTECTOMY    . DILATION AND CURETTAGE OF UTERUS  1998  . ESOPHAGOGASTRODUODENOSCOPY (EGD) WITH PROPOFOL N/A 06/13/2019   Procedure: ESOPHAGOGASTRODUODENOSCOPY (EGD) WITH PROPOFOL;  Surgeon: Rogene Houston, MD;  [facility];  Service: Endoscopy;  Laterality: N/A;  7:30  . KNEE SURGERY    . MALONEY DILATION  06/13/2019   Procedure: MALONEY DILATION;  Surgeon: Rogene Houston, MD;  Location: [facility];  Service: Endoscopy;;  . MASS EXCISION      OB History    Gravida  1   Para      Term      Preterm      AB  1   Living  0     SAB  1   TAB       Ectopic      Multiple      Live Births               Home Medications    Prior to Admission medications   Medication Sig Start Date End Date Taking? Authorizing Provider  hydrochlorothiazide (HYDRODIURIL) 25 MG tablet Take 25 mg by mouth daily.  12/09/18  Yes [provider]  HYDROcodone-acetaminophen (NORCO/VICODIN) 5-325 MG tablet Take 1 tablet by mouth at bedtime.  01/03/19  Yes [provider]  levocetirizine (XYZAL) 5 MG tablet Take 1 tablet (5 mg total) by mouth every morning. 04/18/19  Yes Nicholas Lose, MD  losartan (COZAAR) 100 MG tablet Take 100 mg by mouth daily.  12/09/18  Yes [provider]  methocarbamol (ROBAXIN-750) 750 MG tablet Take 1 tablet (750 mg total) by mouth at bedtime. 04/20/17  Yes Nicholas Lose, MD  montelukast (SINGULAIR) 10 MG tablet Take 1 tablet (10 mg total) by mouth at bedtime. 06/12/19  Yes Valentina Shaggy, MD  Multiple Vitamins-Minerals (MULTIVITAMIN WITH MINERALS) tablet Take 1 tablet by mouth daily.   Yes [provider]  oxybutynin (DITROPAN-XL) 5 MG 24 hr tablet Take 10 mg by mouth daily.  Yes [provider]  pantoprazole (PROTONIX) 40 MG tablet Take 40 mg by mouth 2 (two) times daily.     Yes [provider]  venlafaxine XR (EFFEXOR-XR) 75 MG 24 hr capsule Take 225 mg by mouth daily.   Yes [provider]  ALPRAZolam Duanne Moron) 0.5 MG tablet Take 0.5 mg by mouth at bedtime.  07/01/18   [provider]  atenolol (TENORMIN) 50 MG tablet Take 1 tablet (50 mg total) by mouth daily. Patient taking differently: Take 50 mg by mouth at bedtime.  04/19/18   Nicholas Lose, MD  clobetasol ointment (TEMOVATE) 1.02 % Apply 1 application topically 2 (two) times daily. 10/07/19   Valentina Shaggy, MD  EPINEPHrine 0.3 mg/0.3 mL IJ SOAJ injection Inject 0.3 mg into the muscle as needed for anaphylaxis.  01/15/19   [provider]  ibuprofen (ADVIL) 600 MG tablet Take 1 tablet  (600 mg total) by mouth every 6 (six) hours as needed. 01/15/20   Aleyah Balik, Darrelyn Hillock, FNP  montelukast (SINGULAIR) 10 MG tablet Take 1 tablet (10 mg total) by mouth at bedtime. 10/17/19   Valentina Shaggy, MD  ondansetron (ZOFRAN) 4 MG tablet Take 1 tablet (4 mg total) by mouth every 8 (eight) hours as needed for nausea or vomiting. 04/26/17   Nicholas Lose, MD  predniSONE (DELTASONE) 10 MG tablet Take 2 tablets (20 mg total) by mouth daily. 01/15/20   AvegnoDarrelyn Hillock, FNP    Family History Family History  Problem Relation Age of Onset  . Diabetes Mother   . Hypertension Mother   . Breast cancer Mother 18       dx again at 30; PALB2 and CHEK2 positive  . Diabetes Father   . Hypertension Father   . Coronary artery disease Father   . Hypertension Sister   . Breast cancer Sister        diagnosed in her 17's  . Thyroid cancer Maternal Uncle 46  . Lung cancer Maternal Grandmother   . Leukemia Maternal Grandfather 74       dx with hairy cell leukemia at 52; CLL at 11  . Lymphoma Maternal Grandfather 82       hodgkins lymphoma  . Brain cancer Paternal Grandmother   . Prostate cancer Paternal Grandfather   . Lung cancer Maternal Aunt 62  . Lymphoma Maternal Uncle 60       follicular lymphoma  . Allergic rhinitis Neg Hx   . Asthma Neg Hx     Social History Social History   Tobacco Use  . Smoking status: Former Smoker    Packs/day: 0.50    Years: 5.00    Pack years: 2.50    Types: Cigarettes    Quit date: 09/15/2017    Years since quitting: 2.3  . Smokeless tobacco: Never Used  Substance Use Topics  . Alcohol use: No  . Drug use: No     Allergies   Penicillins   Review of Systems Review of Systems  Constitutional: Positive for fatigue.  Respiratory: Negative.   Cardiovascular: Negative.   Musculoskeletal: Positive for back pain.  All other systems reviewed and are negative.    Physical Exam Triage Vital Signs ED Triage Vitals  Enc Vitals Group     BP  01/15/20 1754 (!) 135/96     Pulse Rate 01/15/20 1754 95     Resp 01/15/20 1754 20     Temp 01/15/20 1754 98.7 F (37.1 C)     Temp  Source 01/15/20 1754 Oral     SpO2 01/15/20 1754 96 %     Weight --      Height --      Head Circumference --      Peak Flow --      Pain Score 01/15/20 1750 5     Pain Loc --      Pain Edu? --      Excl. in [place]? --    No data found.  Updated Vital Signs BP (!) 135/96 (BP Location: Right Arm)   Pulse 95   Temp 98.7 F (37.1 C) (Oral)   Resp 20   LMP 05/22/2015   SpO2 96%   Visual Acuity Right Eye Distance:   Left Eye Distance:   Bilateral Distance:    Right Eye Near:   Left Eye Near:    Bilateral Near:     Physical Exam Vitals and nursing note reviewed.  Constitutional:      General: She is not in acute distress.    Appearance: Normal appearance. She is normal weight. She is not ill-appearing, toxic-appearing or diaphoretic.  Cardiovascular:     Rate and Rhythm: Normal rate and regular rhythm.     Pulses: Normal pulses.     Heart sounds: Normal heart sounds. No murmur. No friction rub. No gallop.   Pulmonary:     Effort: Pulmonary effort is normal. No respiratory distress.     Breath sounds: Normal breath sounds. No stridor. No wheezing, rhonchi or rales.  Chest:     Chest wall: No tenderness.  Abdominal:     General: Abdomen is flat. There is no distension.     Palpations: Abdomen is soft. There is no mass.     Tenderness: There is no abdominal tenderness. There is no right CVA tenderness, left CVA tenderness, guarding or rebound.     Hernia: No hernia is present.  Musculoskeletal:        General: Tenderness present.     Lumbar back: Tenderness present.     Comments: Back:  Patient ambulates from chair to exam table without difficulty.  Inspection: Skin clear and intact without obvious swelling, erythema, or ecchymosis. Warm to the touch  Palpation: Vertebral processes nontender. Tenderness about the lower back  ROM: FROM  Strength: 5/5 hip flexion, 5/5 knee extension, 5/5 knee flexion, 5/5 plantar flexion, 5/5 dorsiflexion    Neurological:     Mental Status: She is alert.      UC Treatments / Results  Labs (all labs ordered are listed, but only abnormal results are displayed) Labs Reviewed  NOVEL CORONAVIRUS, NAA  CBC WITH DIFFERENTIAL/PLATELET  BASIC METABOLIC PANEL  POC SARS CORONAVIRUS 2 AG -  ED    EKG   Radiology No results found.  Procedures Procedures (including critical care time)  Medications Ordered in UC Medications - No data to display  Initial Impression / Assessment and Plan / UC Course  I have reviewed the triage vital signs and the nursing notes.  Pertinent labs & imaging results that were available during my care of the patient were reviewed by me and considered in my medical decision making (see chart for details).    Patient is stable for discharge.  CBC and CMP were completed.  POCT COVID-19 test were negative.  PCR COVID-19 test were ordered.  She was advised to follow-up with PCP.  To return or go to ED for worsening symptoms.  Final Clinical Impressions(s) / UC Diagnoses   Final diagnoses:  Acute bilateral low back pain without sciatica  Other fatigue  Encounter for screening for COVID-19     Discharge Instructions     CBC and CMP were completed at this visit. POCT COVID-19 test was negative  COVID testing ordered.  It will take between 2-7 days for test results.  Someone will contact you regarding abnormal results.    In the meantime: You should remain isolated in your home for 10 days from symptom onset AND greater than 24 hours after symptoms resolution (absence of fever without the use of fever-reducing medication and improvement in respiratory symptoms), whichever is longer Get plenty of rest and push fluids Use medications daily for symptom relief Use OTC medications like ibuprofen or tylenol as needed fever or pain Call or go to the ED if you  have any new or worsening symptoms such as fever, worsening cough, shortness of breath, chest tightness, chest pain, turning blue, changes in mental status, etc...     ED Prescriptions    Medication Sig Dispense Auth. Provider   ibuprofen (ADVIL) 600 MG tablet Take 1 tablet (600 mg total) by mouth every 6 (six) hours as needed. 30 tablet Ryleah Miramontes S, FNP   predniSONE (DELTASONE) 10 MG tablet Take 2 tablets (20 mg total) by mouth daily. 15 tablet Marcedes Tech, Darrelyn Hillock, FNP     PDMP not reviewed this encounter.   Emerson Monte, FNP 01/15/20 1835

## 2020-01-15 NOTE — ED Triage Notes (Signed)
01/13/2020 onset of not feeling well, patient feels tired, feverish.   Denies cough, cold or runny nose.  Patient complains of back pain in mid to lower back

## 2020-01-15 NOTE — Discharge Instructions (Addendum)
CBC and CMP were completed at this visit. POCT COVID-19 test was negative  COVID testing ordered.  It will take between 2-7 days for test results.  Someone will contact you regarding abnormal results.    In the meantime: You should remain isolated in your home for 10 days from symptom onset AND greater than 24 hours after symptoms resolution (absence of fever without the use of fever-reducing medication and improvement in respiratory symptoms), whichever is longer Get plenty of rest and push fluids Use medications daily for symptom relief Use OTC medications like ibuprofen or tylenol as needed fever or pain Call or go to the ED if you have any new or worsening symptoms such as fever, worsening cough, shortness of breath, chest tightness, chest pain, turning blue, changes in mental status, etc..Marland Kitchen

## 2020-01-16 LAB — CBC WITH DIFFERENTIAL/PLATELET
Basophils Absolute: 0 10*3/uL (ref 0.0–0.2)
Basos: 0 %
EOS (ABSOLUTE): 0.1 10*3/uL (ref 0.0–0.4)
Eos: 1 %
Hematocrit: 39.6 % (ref 34.0–46.6)
Hemoglobin: 13 g/dL (ref 11.1–15.9)
Immature Grans (Abs): 0 10*3/uL (ref 0.0–0.1)
Immature Granulocytes: 0 %
Lymphocytes Absolute: 2.9 10*3/uL (ref 0.7–3.1)
Lymphs: 35 %
MCH: 30.4 pg (ref 26.6–33.0)
MCHC: 32.8 g/dL (ref 31.5–35.7)
MCV: 93 fL (ref 79–97)
Monocytes Absolute: 0.7 10*3/uL (ref 0.1–0.9)
Monocytes: 8 %
Neutrophils Absolute: 4.6 10*3/uL (ref 1.4–7.0)
Neutrophils: 56 %
Platelets: 307 10*3/uL (ref 150–450)
RBC: 4.27 x10E6/uL (ref 3.77–5.28)
RDW: 12.2 % (ref 11.7–15.4)
WBC: 8.3 10*3/uL (ref 3.4–10.8)

## 2020-01-16 LAB — BASIC METABOLIC PANEL
BUN/Creatinine Ratio: 22 (ref 9–23)
BUN: 16 mg/dL (ref 6–24)
CO2: 26 mmol/L (ref 20–29)
Calcium: 10.1 mg/dL (ref 8.7–10.2)
Chloride: 101 mmol/L (ref 96–106)
Creatinine, Ser: 0.74 mg/dL (ref 0.57–1.00)
GFR calc Af Amer: 109 mL/min/{1.73_m2} (ref >59)
GFR calc non Af Amer: 95 mL/min/{1.73_m2} (ref >59)
Glucose: 99 mg/dL (ref 65–99)
Potassium: 4 mmol/L (ref 3.5–5.2)
Sodium: 141 mmol/L (ref 134–144)

## 2020-01-16 LAB — NOVEL CORONAVIRUS, NAA: SARS-CoV-2, NAA: NOT DETECTED

## 2020-01-16 LAB — SARS-COV-2, NAA 2 DAY TAT

## 2020-01-20 ENCOUNTER — Telehealth: Payer: Self-pay

## 2020-01-20 NOTE — Telephone Encounter (Signed)
Patient called today verbalizing that she received her allergy injection on last Wednesday and started to experience cold and flu like symptoms on Thursday. She verbalized that she felt "bad" for a couple of days and that she went for COVID testing which was negative. Patient was concerned that this may be a side effect of her allergy injections since she Googled it and it came up that it could possibly happen. Patient received .30 from her second Red vial. Patient questioned rather she should come in for her injection tomorrow. Patient was instructed that this is unlikely and that she should come in tomorrow to receive her injection as planned. Please advise.

## 2020-01-20 NOTE — Telephone Encounter (Signed)
I completely agree. With more people getting vaccinated and going out and about, run of the mill viruses are increasing in frequency. Let's see how she does this week. Please remind her to take her antihistamine before her injection.   Salvatore Marvel, MD [facility] [place]

## 2020-01-21 ENCOUNTER — Ambulatory Visit (INDEPENDENT_AMBULATORY_CARE_PROVIDER_SITE_OTHER): Payer: BLUE CROSS/BLUE SHIELD

## 2020-01-21 DIAGNOSIS — J309 Allergic rhinitis, unspecified: Secondary | ICD-10-CM

## 2020-01-21 NOTE — Telephone Encounter (Signed)
Called and advised to patient. Patient verbalized understanding and will take her antihistamines prior to injection.

## 2020-01-28 ENCOUNTER — Ambulatory Visit (INDEPENDENT_AMBULATORY_CARE_PROVIDER_SITE_OTHER): Payer: BLUE CROSS/BLUE SHIELD

## 2020-01-28 DIAGNOSIS — J309 Allergic rhinitis, unspecified: Secondary | ICD-10-CM

## 2020-02-04 ENCOUNTER — Ambulatory Visit (INDEPENDENT_AMBULATORY_CARE_PROVIDER_SITE_OTHER): Payer: BLUE CROSS/BLUE SHIELD

## 2020-02-04 DIAGNOSIS — J309 Allergic rhinitis, unspecified: Secondary | ICD-10-CM | POA: Diagnosis not present

## 2020-02-11 ENCOUNTER — Ambulatory Visit (INDEPENDENT_AMBULATORY_CARE_PROVIDER_SITE_OTHER): Payer: BLUE CROSS/BLUE SHIELD

## 2020-02-11 DIAGNOSIS — J309 Allergic rhinitis, unspecified: Secondary | ICD-10-CM | POA: Diagnosis not present

## 2020-02-18 ENCOUNTER — Other Ambulatory Visit (HOSPITAL_COMMUNITY): Payer: Self-pay | Admitting: Internal Medicine

## 2020-02-18 ENCOUNTER — Other Ambulatory Visit: Payer: Self-pay | Admitting: Internal Medicine

## 2020-02-18 DIAGNOSIS — E041 Nontoxic single thyroid nodule: Secondary | ICD-10-CM

## 2020-02-19 DIAGNOSIS — J3089 Other allergic rhinitis: Secondary | ICD-10-CM

## 2020-02-19 NOTE — Progress Notes (Signed)
VIALS EXP 02-18-21

## 2020-02-20 ENCOUNTER — Ambulatory Visit (INDEPENDENT_AMBULATORY_CARE_PROVIDER_SITE_OTHER): Payer: BLUE CROSS/BLUE SHIELD

## 2020-02-20 DIAGNOSIS — J309 Allergic rhinitis, unspecified: Secondary | ICD-10-CM | POA: Diagnosis not present

## 2020-02-25 ENCOUNTER — Ambulatory Visit (HOSPITAL_COMMUNITY)
Admission: RE | Admit: 2020-02-25 | Discharge: 2020-02-25 | Disposition: A | Discharge status: Home / Self Care | Payer: BLUE CROSS/BLUE SHIELD | Source: Ambulatory Visit | Attending: Internal Medicine | Admitting: Internal Medicine

## 2020-02-25 ENCOUNTER — Ambulatory Visit (INDEPENDENT_AMBULATORY_CARE_PROVIDER_SITE_OTHER): Payer: BLUE CROSS/BLUE SHIELD

## 2020-02-25 ENCOUNTER — Other Ambulatory Visit: Payer: Self-pay

## 2020-02-25 DIAGNOSIS — J309 Allergic rhinitis, unspecified: Secondary | ICD-10-CM

## 2020-02-25 DIAGNOSIS — E041 Nontoxic single thyroid nodule: Secondary | ICD-10-CM | POA: Diagnosis not present

## 2020-03-03 ENCOUNTER — Ambulatory Visit (INDEPENDENT_AMBULATORY_CARE_PROVIDER_SITE_OTHER): Payer: BLUE CROSS/BLUE SHIELD

## 2020-03-03 DIAGNOSIS — J309 Allergic rhinitis, unspecified: Secondary | ICD-10-CM

## 2020-03-04 ENCOUNTER — Other Ambulatory Visit (HOSPITAL_COMMUNITY): Payer: Self-pay | Admitting: Internal Medicine

## 2020-03-04 DIAGNOSIS — E041 Nontoxic single thyroid nodule: Secondary | ICD-10-CM

## 2020-03-05 ENCOUNTER — Other Ambulatory Visit (HOSPITAL_COMMUNITY): Payer: Self-pay | Admitting: Internal Medicine

## 2020-03-05 ENCOUNTER — Other Ambulatory Visit: Payer: Self-pay

## 2020-03-05 ENCOUNTER — Other Ambulatory Visit: Payer: Self-pay | Admitting: Internal Medicine

## 2020-03-05 ENCOUNTER — Ambulatory Visit (HOSPITAL_COMMUNITY)
Admission: RE | Admit: 2020-03-05 | Discharge: 2020-03-05 | Disposition: A | Discharge status: Home / Self Care | Payer: BLUE CROSS/BLUE SHIELD | Source: Ambulatory Visit | Attending: Internal Medicine | Admitting: Internal Medicine

## 2020-03-05 DIAGNOSIS — M79661 Pain in right lower leg: Secondary | ICD-10-CM

## 2020-03-10 ENCOUNTER — Ambulatory Visit (HOSPITAL_COMMUNITY)
Admission: RE | Admit: 2020-03-10 | Discharge: 2020-03-10 | Disposition: A | Discharge status: Home / Self Care | Payer: BLUE CROSS/BLUE SHIELD | Source: Ambulatory Visit | Attending: Internal Medicine | Admitting: Internal Medicine

## 2020-03-10 ENCOUNTER — Encounter (HOSPITAL_COMMUNITY): Payer: Self-pay

## 2020-03-10 ENCOUNTER — Other Ambulatory Visit (HOSPITAL_COMMUNITY): Payer: Self-pay | Admitting: Internal Medicine

## 2020-03-10 ENCOUNTER — Ambulatory Visit (INDEPENDENT_AMBULATORY_CARE_PROVIDER_SITE_OTHER): Payer: BLUE CROSS/BLUE SHIELD

## 2020-03-10 ENCOUNTER — Other Ambulatory Visit: Payer: Self-pay

## 2020-03-10 DIAGNOSIS — J309 Allergic rhinitis, unspecified: Secondary | ICD-10-CM

## 2020-03-10 DIAGNOSIS — D440 Neoplasm of uncertain behavior of thyroid gland: Secondary | ICD-10-CM | POA: Diagnosis not present

## 2020-03-10 DIAGNOSIS — E041 Nontoxic single thyroid nodule: Secondary | ICD-10-CM

## 2020-03-10 DIAGNOSIS — E042 Nontoxic multinodular goiter: Secondary | ICD-10-CM | POA: Diagnosis present

## 2020-03-10 MED ORDER — LIDOCAINE HCL (PF) 2 % IJ SOLN
INTRAMUSCULAR | Status: AC
  Filled 2020-03-10: qty 20

## 2020-03-10 NOTE — Procedures (Signed)
PreOperative Dx: BILATERAL thyroid nodule Postoperative Dx: BILATERAL  thyroid nodule Procedure:   US guided FNA of BILATERAL  thyroid nodule Radiologist:  Thornton Papas Anesthesia:  9 ml of 2% lidocaine Specimen:  FNA x 5 of RT mid nodule and FNA x 5 of LT inferior thyroid nodule  EBL:   < 1 ml Complications: None

## 2020-03-12 LAB — CYTOLOGY - NON PAP

## 2020-03-17 ENCOUNTER — Ambulatory Visit (INDEPENDENT_AMBULATORY_CARE_PROVIDER_SITE_OTHER): Payer: BLUE CROSS/BLUE SHIELD

## 2020-03-17 DIAGNOSIS — J309 Allergic rhinitis, unspecified: Secondary | ICD-10-CM | POA: Diagnosis not present

## 2020-03-24 ENCOUNTER — Ambulatory Visit (INDEPENDENT_AMBULATORY_CARE_PROVIDER_SITE_OTHER): Payer: BLUE CROSS/BLUE SHIELD

## 2020-03-24 DIAGNOSIS — J309 Allergic rhinitis, unspecified: Secondary | ICD-10-CM

## 2020-03-30 ENCOUNTER — Encounter (HOSPITAL_COMMUNITY): Payer: Self-pay

## 2020-04-07 ENCOUNTER — Ambulatory Visit (INDEPENDENT_AMBULATORY_CARE_PROVIDER_SITE_OTHER): Payer: BLUE CROSS/BLUE SHIELD

## 2020-04-07 DIAGNOSIS — J309 Allergic rhinitis, unspecified: Secondary | ICD-10-CM

## 2020-04-16 ENCOUNTER — Ambulatory Visit (INDEPENDENT_AMBULATORY_CARE_PROVIDER_SITE_OTHER): Payer: Commercial Managed Care - PPO

## 2020-04-16 ENCOUNTER — Encounter: Payer: Self-pay | Admitting: Allergy & Immunology

## 2020-04-16 ENCOUNTER — Other Ambulatory Visit: Payer: Self-pay | Admitting: Allergy & Immunology

## 2020-04-16 ENCOUNTER — Other Ambulatory Visit: Payer: Self-pay

## 2020-04-16 ENCOUNTER — Ambulatory Visit (INDEPENDENT_AMBULATORY_CARE_PROVIDER_SITE_OTHER): Payer: Commercial Managed Care - PPO | Admitting: Allergy & Immunology

## 2020-04-16 DIAGNOSIS — J309 Allergic rhinitis, unspecified: Secondary | ICD-10-CM | POA: Diagnosis not present

## 2020-04-16 DIAGNOSIS — L299 Pruritus, unspecified: Secondary | ICD-10-CM

## 2020-04-16 NOTE — Patient Instructions (Addendum)
1. Itching with P/SAR (grasses, ragweed, weeds, indoor molds, dust mites and cat) - We are going to continue with allergy shots at the same schedule. - We are changing her to Schedule B to get this advance go faster.  - Continue taking: Singulair (montelukast) 10mg  daily and Xyzal (levocetirizine) 5mg  1-2 times daily and Nasacort one spray per nostril as needed. - You can use an extra dose of the antihistamine, if needed, for breakthrough symptoms.  - Be sure to moisturize since moist skin is less itchy than dry skin. - Avoid lotions with fragrances and dyes.  - We are going to get some labs to see if this itching might be related to your thyroid issues.   2. Dysphagia (difficulty swallowing) - Continue with Protonix twice daily.   3. Return in about 6 months (around 10/17/2020). This can be an in-person, a virtual Webex or a telephone follow up visit.   Please inform us of any Emergency Department visits, hospitalizations, or changes in symptoms. Call us before going to the ED for breathing or allergy symptoms since we might be able to fit you in for a sick visit. Feel free to contact us anytime with any questions, problems, or concerns.  It was a pleasure to see you again today!  Websites that have reliable patient information: 1. American Academy of Asthma, Allergy, and Immunology: www.aaaai.org 2. Food Allergy Research and Education (FARE): foodallergy.org 3. Mothers of Asthmatics: http://www.asthmacommunitynetwork.org 4. American College of Allergy, Asthma, and Immunology: www.acaai.org   COVID-19 Vaccine Information can be found at: ShippingScam.co.uk For questions related to vaccine distribution or appointments, please email vaccine@[email or web address] .com or call [phone].     "Like" [place] on Facebook and Instagram for our latest updates!        Make sure you are registered to vote! If you have moved or changed any of your  contact information, you will need to get this updated before voting!  In some cases, you MAY be able to register to vote online: [email or web address]

## 2020-04-16 NOTE — Progress Notes (Signed)
FOLLOW UP  Date of Service/Encounter:  04/16/20   Assessment:   Seasonal and perennial allergic rhinitis(grasses, ragweed, weeds, indoor molds, dust mites and cat)  Itching- improved with allergen immunotherapy  Dysphagia- with a history of a benign esophageal mass in 2008  Bilateral cerumen impaction - removed in clinic today  Plan/Recommendations:   1. Itching with P/SAR (grasses, ragweed, weeds, indoor molds, dust mites and cat) - We are going to continue with allergy shots at the same schedule. - We are changing her to Schedule B to get this advance go faster.  - Continue taking: Singulair (montelukast) 35m daily and Xyzal (levocetirizine) 523m1-2 times daily and Nasacort one spray per nostril as needed. - You can use an extra dose of the antihistamine, if needed, for breakthrough symptoms.  - Be sure to moisturize since moist skin is less itchy than dry skin. - Avoid lotions with fragrances and dyes.  - We are going to get some labs to see if this itching might be related to your thyroid issues.   2. Dysphagia (difficulty swallowing) - Continue with Protonix twice daily.   3. Return in about 6 months (around 10/17/2020). This can be an in-person, a virtual Webex or a telephone follow up visit.  Subjective:   KaDURU REIGERs a [age].o. female presenting today for follow up of  Chief Complaint  Patient presents with  . Allergic Rhinitis     doing well. they seem to be helping some. she does have some ongoing itching but not a lot.     KaTELENA PEYSERas a history of the following: Patient Active Problem List   Diagnosis Date Noted  . Oropharyngeal dysphagia 05/06/2019  . Dysphagia, pharyngoesophageal phase 05/05/2019  . Vasomotor symptoms due to menopause 03/13/2019  . Genetic testing 06/03/2015  . 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  KaDecembers a [age].o. female  presenting for a follow up visit.  She was last seen in January 2021.  At that time, she did have bilateral cerumen impaction and we removed it in the clinic.  She was also endorsing itching with perennial and seasonal allergic rhinitis.  We continue with allergy shots and continued Singulair as well as Xyzal and Nasacort.  We did tell her that she could use an extra Xyzal for breakthrough symptoms.  For her dysphagia, we will continue with Protonix twice daily.  Allergic Rhinitis Symptom History: She remains on montelukast 1096maily. She is also on the Xyzal 1-2 times daily as well as Nasacort one spray per nostril daily. She has not required any antibiotics or prednisone at all since the last visit.   Her itching continues but it seems somewhat better.  KatFlorice on allergen immunotherapy. She receives two injections. Immunotherapy script #1 contains weeds, grasses and cat. She currently receives 0.76m75m the RED vial (1/100). Immunotherapy script #2 contains molds and dust mites. She currently receives 0.76mL18mthe RED vial (1/100). She started shots May of 2020 and reached maintenance in December of 2020.  Aside from some very localized hives, she has had no reactions.  The Nasacort does help when she has nasal congestion.  She does not use it every day.  She did have a biopsy of the left side of her thyroid that came back as "suspicious". She apparently had some hyperthyroidism noted on her labwork. She might have to have this removed. She has lost  a ton of weight. There is a history of cancer in multiple family members, although she herself has never had cancer. They think that they caught the lesion early. She is going to have surgery sometime in the near future. She has a consultation on August 11th. She will need to take Synthroid after her surgery.  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 She did get vaccinated for COVID19 back in the early summer.    Otherwise, there have been no changes to her past medical history, surgical history, family history, or social history.    Review of Systems  Constitutional: Negative.  Negative for chills, fever, malaise/fatigue and weight loss.  HENT: Negative for congestion, ear discharge, ear pain, sinus pain and sore throat.   Eyes: Negative for pain, discharge and redness.  Respiratory: Negative for cough, sputum production, shortness of breath and wheezing.   Cardiovascular: Negative.  Negative for chest pain and palpitations.  Gastrointestinal: Negative for abdominal pain, constipation, diarrhea, heartburn, nausea and vomiting.  Skin: Negative.  Negative for itching and rash.  Neurological: Negative for dizziness and headaches.  Endo/Heme/Allergies: Positive for environmental allergies. Does not bruise/bleed easily.       Objective:   Blood pressure 112/74, pulse 70, resp. rate 18, height 5' 3" (1.6 m), weight (!) 265 lb (120.2 kg), last menstrual period 05/22/2015, SpO2 96 %. Body mass index is 46.94 kg/m.   Physical Exam:  Physical Exam Constitutional:      Appearance: She is well-developed.     Comments: Obese female, but she has clearly lost weight.  HENT:     Head: Normocephalic and atraumatic.     Right Ear: Tympanic membrane, ear canal and external ear normal.     Left Ear: Tympanic membrane, ear canal and external ear normal.     Nose: No nasal deformity, septal deviation, mucosal edema or rhinorrhea.     Right Turbinates: Enlarged and swollen.     Left Turbinates: Enlarged and swollen.     Right Sinus: No maxillary sinus tenderness or frontal sinus tenderness.     Left Sinus: No maxillary sinus tenderness or frontal sinus tenderness.     Comments: Minimal cobblestoning present in the posterior oropharynx.    Mouth/Throat:     Mouth: Mucous membranes are not pale and not dry.     Pharynx: Uvula midline.  Eyes:     General:        Right eye: No discharge.        Left  eye: No discharge.     Conjunctiva/sclera: Conjunctivae normal.     Right eye: Right conjunctiva is not injected. No chemosis.    Left eye: Left conjunctiva is not injected. No chemosis.    Pupils: Pupils are equal, round, and reactive to light.  Cardiovascular:     Rate and Rhythm: Normal rate and regular rhythm.     Heart sounds: Normal heart sounds.  Pulmonary:     Effort: Pulmonary effort is normal. No tachypnea, accessory muscle usage or respiratory distress.     Breath sounds: Normal breath sounds. No wheezing, rhonchi or rales.     Comments: Moving air well in all lung fields. No increased work of breathing noted.  Chest:     Chest wall: No tenderness.  Lymphadenopathy:     Cervical: No cervical adenopathy.  Skin:    Coloration: Skin is not pale.     Findings: No abrasion, erythema,  petechiae or rash. Rash is not papular, urticarial or vesicular.     Comments: No eczematous or urticarial lesions noted.   Neurological:     Mental Status: She is alert.      Diagnostic studies: none    Salvatore Marvel, MD  [facility] [place] [facility]

## 2020-04-18 ENCOUNTER — Encounter: Payer: Self-pay | Admitting: Allergy & Immunology

## 2020-04-19 ENCOUNTER — Inpatient Hospital Stay: Payer: BLUE CROSS/BLUE SHIELD | Admitting: Hematology and Oncology

## 2020-04-19 NOTE — Assessment & Plan Note (Deleted)
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  Lifetime risk of breast cancer in vary from 28% to 38%. Higher with family history of breast cancer.   Breast Cancer Surveillance: 1. Breast exam8/10/2019: Benign 2. Mammogramand ultrasound left breast 09/09/2019 benign.  Cluster of cysts at 5 o'clock position 1 cm from the nipple measuring 0.5 cm.  Repeat mammogram in 6 months recommended.  Breast Density Category B.  Patient has not done breast MRIs even thoughwerecommended Itprimarily because of cost concerns. Patient fully understands that the best way to do surveillance with Chek 2 mutation is through MRIs.  Patient wants to consider bilateral mastectomies but she made up her decision not to do the surgery at this time.  She may decide to have bilateral mastectomies next year.  Return to clinic in 1 year for follow-up

## 2020-04-20 ENCOUNTER — Telehealth: Payer: Self-pay | Admitting: Hematology and Oncology

## 2020-04-20 LAB — ANTINUCLEAR ANTIBODIES, IFA: ANA Titer 1: NEGATIVE

## 2020-04-20 NOTE — Progress Notes (Signed)
  HEMATOLOGY-ONCOLOGY TELEPHONE VISIT PROGRESS NOTE  I connected with Christine Mason on 04/21/2020 at  8:30 AM EDT by telephone and verified that I am speaking with the correct person using two identifiers.  I discussed the limitations, risks, security and privacy concerns of performing an evaluation and management service by telephone and the availability of in person appointments.  I also discussed with the patient that there may be a patient responsible charge related to this service. The patient expressed understanding and agreed to proceed.   History of Present Illness: Christine Mason is a 49 y.o. 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 Breast MRI on 05/22/19 showed no evidence of malignancy bilaterally. Mammogram on 12/1/420 showed a possible left breast mass. Diagnostic mammogram on 09/09/19 showed the mass to be probably benign, recommended 6 month follow-up. She presents to the clinic today for annual follow-up.   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  Lifetime risk of breast cancer in vary from 28% to 38%. Higher with family history of breast cancer.   Breast Cancer Surveillance: 1. Breast exam8/4/21: Normal 2. Mammogram12/22/2020: Left breast dilated ducts measuring 0.5 cm, possibly benign 32-monthmammogram was recommended on the left breast.  Thyroid ultrasound June 2021: Thyroid nodule requires surgery  Patient has not done breast MRIs even thoughwerecommended Itprimarily because of cost concerns. Patient fully understands that the best way to do surveillance with Chek 2 mutation is through MRIs.  Patient wants to consider bilateral mastectomies but she made up her decision  not to do the surgery at this time.  She may decide to have bilateral mastectomies next year.  [facility] [employer] Return to clinic in 1 year for follow-up    I discussed the assessment and treatment plan with the patient. The patient was provided an opportunity to ask questions and all were answered. The patient agreed with the plan and demonstrated an understanding of the instructions. The patient was advised to call back or seek an in-person evaluation if the symptoms worsen or if the condition fails to improve as anticipated.   I provided 15 minutes of non-face-to-face time during this encounter.   VRulon Eisenmenger MD 04/21/2020    I, Christine Mason, am acting as scribe for VNicholas Lose MD.  I have reviewed the above documentation for accuracy and completeness, and I agree with the above.

## 2020-04-21 ENCOUNTER — Inpatient Hospital Stay: Payer: BLUE CROSS/BLUE SHIELD | Attending: Hematology and Oncology | Admitting: Hematology and Oncology

## 2020-04-21 ENCOUNTER — Ambulatory Visit: Payer: BLUE CROSS/BLUE SHIELD | Admitting: Endocrinology

## 2020-04-21 ENCOUNTER — Ambulatory Visit (INDEPENDENT_AMBULATORY_CARE_PROVIDER_SITE_OTHER): Payer: Commercial Managed Care - PPO

## 2020-04-21 DIAGNOSIS — J309 Allergic rhinitis, unspecified: Secondary | ICD-10-CM | POA: Diagnosis not present

## 2020-04-21 DIAGNOSIS — Z1501 Genetic susceptibility to malignant neoplasm of breast: Secondary | ICD-10-CM

## 2020-04-21 NOTE — Assessment & Plan Note (Signed)
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  Lifetime risk of breast cancer in vary from 28% to 38%. Higher with family history of breast cancer.   Breast Cancer Surveillance: 1. Breast exam8/4/21: Normal 2. Mammogram12/22/2020: Left breast dilated ducts measuring 0.5 cm, possibly benign 20-month mammogram was recommended on the left breast.  Thyroid ultrasound June 2021: Thyroid nodule requires biopsy  Patient has not done breast MRIs even thoughwerecommended Itprimarily because of cost concerns. Patient fully understands that the best way to do surveillance with Chek 2 mutation is through MRIs.  Patient wants to consider bilateral mastectomies but she made up her decision not to do the surgery at this time.  She may decide to have bilateral mastectomies next year.  Return to clinic in 1 year for follow-up

## 2020-04-22 ENCOUNTER — Telehealth: Payer: Self-pay | Admitting: Hematology and Oncology

## 2020-04-22 NOTE — Telephone Encounter (Signed)
Scheduled per 8/4 los. Called and left a msg, mailing appt letter and calendar printout

## 2020-04-23 LAB — ALPHA-GAL PANEL
Alpha Gal IgE*: <0.1 kU/L (ref <0.10)
Beef (Bos spp) IgE: <0.1 kU/L (ref <0.35)
Class Interpretation: 0
Class Interpretation: 0
Class Interpretation: 0
Lamb/Mutton (Ovis spp) IgE: <0.1 kU/L (ref <0.35)
Pork (Sus spp) IgE: <0.1 kU/L (ref <0.35)

## 2020-04-23 LAB — SEDIMENTATION RATE: Sed Rate: 26 mm/hr (ref 0–40)

## 2020-04-23 LAB — THYROID ANTIBODIES
Thyroglobulin Antibody: <1 IU/mL (ref 0.0–0.9)
Thyroperoxidase Ab SerPl-aCnc: <8 IU/mL (ref 0–34)

## 2020-04-23 LAB — C-REACTIVE PROTEIN: CRP: 6 mg/L (ref 0–10)

## 2020-04-28 ENCOUNTER — Ambulatory Visit: Payer: Self-pay | Admitting: Surgery

## 2020-04-30 ENCOUNTER — Ambulatory Visit (INDEPENDENT_AMBULATORY_CARE_PROVIDER_SITE_OTHER): Payer: Commercial Managed Care - PPO

## 2020-04-30 DIAGNOSIS — J309 Allergic rhinitis, unspecified: Secondary | ICD-10-CM | POA: Diagnosis not present

## 2020-05-03 ENCOUNTER — Ambulatory Visit (INDEPENDENT_AMBULATORY_CARE_PROVIDER_SITE_OTHER): Payer: BLUE CROSS/BLUE SHIELD | Admitting: Endocrinology

## 2020-05-03 ENCOUNTER — Encounter: Payer: Self-pay | Admitting: Endocrinology

## 2020-05-03 ENCOUNTER — Other Ambulatory Visit: Payer: Self-pay

## 2020-05-03 DIAGNOSIS — E0590 Thyrotoxicosis, unspecified without thyrotoxic crisis or storm: Secondary | ICD-10-CM

## 2020-05-03 LAB — T4, FREE: Free T4: 0.82 ng/dL (ref 0.60–1.60)

## 2020-05-03 LAB — TSH: TSH: 0.01 u[IU]/mL — ABNORMAL LOW (ref 0.35–4.50)

## 2020-05-03 MED ORDER — METHIMAZOLE 10 MG PO TABS: 10.0000 mg | ORAL_TABLET | Freq: Every day | ORAL | 2 refills | Status: DC

## 2020-05-03 NOTE — Patient Instructions (Signed)
Blood tests are requested for you today.  We'll let you know about the results.  If the thyroid is overactive again, i'll prescribe for you a pill to slow it down. Please come back for a follow-up appointment in 1 month.

## 2020-05-03 NOTE — Progress Notes (Addendum)
Subjective:    Patient ID: Christine Mason, female    DOB: 1971-03-25, 49 y.o.   MRN: [id number]  HPI Pt is referred by Dr Nevada Crane, for nodular thyroid.  Pt was noted to have hyperthyroidism in 2021.   She has no h/o XRT or surgery to the neck.  She has intermitt palpitations, diaphoresis, anxiety, and muscle weakness.  She has lost 50 lbs since 2020.   Past Medical History:  Diagnosis Date  . Chronic leg pain    right  . Chronic pain of left knee   . Depression   . Diverticulitis   . GERD (gastroesophageal reflux disease)   . Hypertension   . Hyperthyroidism   . Sleep apnea     Past Surgical History:  Procedure Laterality Date  . ACNE CYST REMOVAL    . CHOLECYSTECTOMY    . DILATION AND CURETTAGE OF UTERUS  1998  . ESOPHAGOGASTRODUODENOSCOPY (EGD) WITH PROPOFOL N/A 06/13/2019   Procedure: ESOPHAGOGASTRODUODENOSCOPY (EGD) WITH PROPOFOL;  Surgeon: Rogene Houston, MD;  [facility];  Service: Endoscopy;  Laterality: N/A;  7:30  . KNEE SURGERY    . MALONEY DILATION  06/13/2019   Procedure: MALONEY DILATION;  Surgeon: Rogene Houston, MD;  Location: [facility];  Service: Endoscopy;;  . MASS EXCISION      Social History   Socioeconomic History  . Marital status: Married    Spouse name: Not on file  . Number of children: 0  . Years of education: Not on file  . Highest education level: Not on file  Occupational History    Employer: [employer]  Tobacco Use  . Smoking status: Former Smoker    Packs/day: 0.50    Years: 5.00    Pack years: 2.50    Types: Cigarettes    Quit date: 09/15/2017    Years since quitting: 2.6  . Smokeless tobacco: Never Used  Vaping Use  . Vaping Use: Never used  Substance and Sexual Activity  . Alcohol use: No  . Drug use: No  . Sexual activity: Not Currently    Birth control/protection: Post-menopausal  Other Topics Concern  . Not on file  Social History Narrative  . Not on file   Social Determinants of Health    Financial Resource Strain:   . Difficulty of Paying Living Expenses:   Food Insecurity:   . Worried About Charity fundraiser in the Last Year:   . Arboriculturist in the Last Year:   Transportation Needs:   . Film/video editor (Medical):   Marland Kitchen Lack of Transportation (Non-Medical):   Physical Activity:   . Days of Exercise per Week:   . Minutes of Exercise per Session:   Stress:   . Feeling of Stress :   Social Connections:   . Frequency of Communication with Friends and Family:   . Frequency of Social Gatherings with Friends and Family:   . Attends Religious Services:   . Active Member of Clubs or Organizations:   . Attends Archivist Meetings:   Marland Kitchen Marital Status:   Intimate Partner Violence:   . Fear of Current or Ex-Partner:   . Emotionally Abused:   Marland Kitchen Physically Abused:   . Sexually Abused:     Current Outpatient Medications on File Prior to Visit  Medication Sig Dispense Refill  . atenolol (TENORMIN) 50 MG tablet Take 1 tablet (50 mg total) by mouth daily. (Patient taking differently: Take 50 mg  by mouth at bedtime. )    . EPINEPHrine 0.3 mg/0.3 mL IJ SOAJ injection Inject 0.3 mg into the muscle as needed for anaphylaxis.     . hydrochlorothiazide (HYDRODIURIL) 25 MG tablet Take 25 mg by mouth daily.     Marland Kitchen HYDROcodone-acetaminophen (NORCO/VICODIN) 5-325 MG tablet Take 1 tablet by mouth at bedtime.     Marland Kitchen levocetirizine (XYZAL) 5 MG tablet Take 1 tablet (5 mg total) by mouth every morning.    Marland Kitchen LORazepam (ATIVAN) 1 MG tablet Take by mouth.    . losartan (COZAAR) 100 MG tablet Take 100 mg by mouth daily.     . methocarbamol (ROBAXIN-750) 750 MG tablet Take 1 tablet (750 mg total) by mouth at bedtime.    . montelukast (SINGULAIR) 10 MG tablet Take 1 tablet (10 mg total) by mouth at bedtime. 30 tablet 5  . ondansetron (ZOFRAN) 4 MG tablet Take 1 tablet (4 mg total) by mouth every 8 (eight) hours as needed for nausea or vomiting. 30 tablet 1  . oxybutynin  (DITROPAN-XL) 5 MG 24 hr tablet Take 10 mg by mouth daily.     . pantoprazole (PROTONIX) 40 MG tablet Take 40 mg by mouth 2 (two) times daily.      Marland Kitchen venlafaxine XR (EFFEXOR-XR) 75 MG 24 hr capsule Take 225 mg by mouth daily.     No current facility-administered medications on file prior to visit.    Allergies  Allergen Reactions  . Penicillins Rash    Has patient had a PCN reaction causing immediate rash, facial/tongue/throat swelling, SOB or lightheadedness with hypotension: {Yes Has patient had a PCN reaction causing severe rash involving mucus membranes or skin necrosis:unknown Has patient had a PCN reaction that required hospitalizationNo Has patient had a PCN reaction occurring within the last 10 years:No If all of the above answers are "NO", then may proceed with Cephalosporin use.     Family History  Problem Relation Age of Onset  . Diabetes Mother   . Hypertension Mother   . Breast cancer Mother 77       dx again at 54; PALB2 and CHEK2 positive  . Diabetes Father   . Hypertension Father   . Coronary artery disease Father   . Hypertension Sister   . Breast cancer Sister        diagnosed in her 85's  . Thyroid cancer Maternal Uncle 46       medullary  . Lung cancer Maternal Grandmother   . Leukemia Maternal Grandfather 48       dx with hairy cell leukemia at 66; CLL at 2  . Lymphoma Maternal Grandfather 82       hodgkins lymphoma  . Brain cancer Paternal Grandmother   . Prostate cancer Paternal Grandfather   . Lung cancer Maternal Aunt 45  . Lymphoma Maternal Uncle 60       follicular lymphoma  . Allergic rhinitis Neg Hx   . Asthma Neg Hx     BP 130/60   Pulse 87   Ht 5' 3"  (1.6 m)   Wt 267 lb (121.1 kg)   LMP 05/22/2015   SpO2 97%   BMI 47.30 kg/m    Review of Systems denies weight loss, sob, tremor, and heat intolerance.       Objective:   Physical Exam VITAL SIGNS:  See vs page GENERAL: no distress NECK: small right sided thyroid nodule is  noted.   SKIN: not diaphoretic NEURO: no tremor   I  have reviewed outside records, and summarized: Pt was noted to have MNG, and referred here.  Radiology recommended bx, but did not consider suppressed TSH  Afirma (LLP nodule): 95% risk of malignancy   outside test results are reviewed: TSH=0.033      Assessment & Plan:  MNG, new to me Hyperthyroidism, due to the MNG.  This is a strong factor favoring benign etiol.  However, as thyroidect addresses both problems, I recommended to pt that she go ahead with that. I also told pt that methimazole would reduce the risk of surgery.   Patient Instructions  Blood tests are requested for you today.  We'll let you know about the results.  If the thyroid is overactive again, i'll prescribe for you a pill to slow it down. Please come back for a follow-up appointment in 1 month.

## 2020-05-04 ENCOUNTER — Inpatient Hospital Stay (HOSPITAL_COMMUNITY): Admission: RE | Admit: 2020-05-04 | Payer: BLUE CROSS/BLUE SHIELD | Source: Ambulatory Visit

## 2020-05-04 ENCOUNTER — Other Ambulatory Visit: Payer: Self-pay

## 2020-05-04 ENCOUNTER — Telehealth: Payer: Self-pay | Admitting: Endocrinology

## 2020-05-04 ENCOUNTER — Encounter (HOSPITAL_COMMUNITY): Payer: BLUE CROSS/BLUE SHIELD

## 2020-05-04 DIAGNOSIS — E0590 Thyrotoxicosis, unspecified without thyrotoxic crisis or storm: Secondary | ICD-10-CM

## 2020-05-04 MED ORDER — METHIMAZOLE 10 MG PO TABS: 10.0000 mg | ORAL_TABLET | Freq: Every day | ORAL | 2 refills | Status: DC

## 2020-05-04 NOTE — Telephone Encounter (Signed)
Outpatient Medication Detail   Disp Refills Start End   methimazole (TAPAZOLE) 10 MG tablet 30 tablet 2 05/04/2020    Sig - Route: Take 1 tablet (10 mg total) by mouth daily. - Oral   Sent to pharmacy as: methimazole (TAPAZOLE) 10 MG tablet   E-Prescribing Status: Receipt confirmed by pharmacy (05/04/2020 11:13 AM EDT)

## 2020-05-04 NOTE — Telephone Encounter (Signed)
Patient called asking if Dr Loanne Drilling could send the methimazole to:  [place], [place] Phone:  [phone]  Fax:  [phone]     Instead of [facility] shared services center.

## 2020-05-11 ENCOUNTER — Ambulatory Visit (HOSPITAL_COMMUNITY)
Admission: RE | Admit: 2020-05-11 | Discharge: 2020-05-11 | Disposition: A | Discharge status: Home / Self Care | Payer: BLUE CROSS/BLUE SHIELD | Source: Ambulatory Visit | Attending: Hematology and Oncology | Admitting: Hematology and Oncology

## 2020-05-11 ENCOUNTER — Other Ambulatory Visit: Payer: Self-pay

## 2020-05-11 DIAGNOSIS — Z1501 Genetic susceptibility to malignant neoplasm of breast: Secondary | ICD-10-CM | POA: Diagnosis not present

## 2020-05-11 DIAGNOSIS — Z1231 Encounter for screening mammogram for malignant neoplasm of breast: Secondary | ICD-10-CM | POA: Diagnosis present

## 2020-05-13 NOTE — Patient Instructions (Signed)
DUE TO COVID-19 ONLY ONE VISITOR IS ALLOWED TO COME WITH YOU AND STAY IN THE WAITING ROOM ONLY DURING PRE OP AND PROCEDURE DAY OF SURGERY. THE 1 VISITOR  MAY VISIT WITH YOU AFTER SURGERY IN YOUR PRIVATE ROOM DURING VISITING HOURS ONLY!  YOU NEED TO HAVE A COVID 19 TEST ON__8-31-21_____ @_______ , THIS TEST MUST BE DONE BEFORE SURGERY,  COVID TESTING SITE [address] [place] [address], IT IS ON THE RIGHT GOING OUT [address] APPROXIMATELY  2 MINUTES PAST [employer] ON THE RIGHT. ONCE YOUR COVID TEST IS COMPLETED,  PLEASE BEGIN THE QUARANTINE INSTRUCTIONS AS OUTLINED IN YOUR HANDOUT.                Christine Mason  05/13/2020   Your procedure is scheduled on: 05-21-20   Report to [facility] Main  Entrance   Report to admitting at           [place] AM     Call this number if you have problems the morning of surgery [phone]    Remember: Do not eat food :After Midnight. You may have clear liquids until 0645 am then nothing by mouth     CLEAR LIQUID DIET  Until 0645 am then nothing by mouth   Foods Allowed                                                             Coffee and tea, regular and decaf                         Plain Jell-O any favor except red or purple                           Fruit ices (not with fruit pulp)                                     Iced Popsicles                                    Carbonated beverages, regular and diet                                    Cranberry, grape and apple juices Sports drinks like Gatorade Lightly seasoned clear broth or consume(fat free) Sugar, honey syrup  _____________________________________________________________________     BRUSH YOUR TEETH MORNING OF SURGERY AND RINSE YOUR MOUTH OUT, NO CHEWING GUM CANDY OR MINTS.     Take these medicines the morning of surgery with A SIP OF WATER: effexor, pantoprazole, oxybutin, xyzal                                 You may not have any metal on  your body including hair pins and              piercings  Do not wear jewelry, make-up, lotions, powders or perfumes, deodorant  Do not wear nail polish on your fingernails.  Do not shave  48 hours prior to surgery.     Do not bring valuables to the hospital. [place].  Contacts, dentures or bridgework may not be worn into surgery.       Patients discharged the day of surgery will not be allowed to drive home. IF YOU ARE HAVING SURGERY AND GOING HOME THE SAME DAY, YOU MUST HAVE AN ADULT TO DRIVE YOU HOME AND BE WITH YOU FOR 24 HOURS. YOU MAY GO HOME BY TAXI OR UBER OR ORTHERWISE, BUT AN ADULT MUST ACCOMPANY YOU HOME AND STAY WITH YOU FOR 24 HOURS.  Name and phone number of your driver:  Special Instructions: N/A              Please read over the following fact sheets you were given: _____________________________________________________________________             [facility] - Preparing for Surgery Before surgery, you can play an important role.  Because skin is not sterile, your skin needs to be as free of germs as possible.  You can reduce the number of germs on your skin by washing with CHG (chlorahexidine gluconate) soap before surgery.  CHG is an antiseptic cleaner which kills germs and bonds with the skin to continue killing germs even after washing. Please DO NOT use if you have an allergy to CHG or antibacterial soaps.  If your skin becomes reddened/irritated stop using the CHG and inform your nurse when you arrive at Short Stay. Do not shave (including legs and underarms) for at least 48 hours prior to the first CHG shower.  You may shave your face/neck. Please follow these instructions carefully:  1.  Shower with CHG Soap the night before surgery and the  morning of Surgery.  2.  If you choose to wash your hair, wash your hair first as usual with your  normal  shampoo.  3.  After you shampoo, rinse your hair and body  thoroughly to remove the  shampoo.                           4.  Use CHG as you would any other liquid soap.  You can apply chg directly  to the skin and wash                       Gently with a scrungie or clean washcloth.  5.  Apply the CHG Soap to your body ONLY FROM THE NECK DOWN.   Do not use on face/ open                           Wound or open sores. Avoid contact with eyes, ears mouth and genitals (private parts).                       Wash face,  Genitals (private parts) with your normal soap.             6.  Wash thoroughly, paying special attention to the area where your surgery  will be performed.  7.  Thoroughly rinse your body with warm water from the neck down.  8.  DO NOT shower/wash with your normal soap after using and  rinsing off  the CHG Soap.                9.  Pat yourself dry with a clean towel.            10.  Wear clean pajamas.            11.  Place clean sheets on your bed the night of your first shower and do not  sleep with pets. Day of Surgery : Do not apply any lotions/deodorants the morning of surgery.  Please wear clean clothes to the hospital/surgery center.  FAILURE TO FOLLOW THESE INSTRUCTIONS MAY RESULT IN THE CANCELLATION OF YOUR SURGERY PATIENT SIGNATURE_________________________________  NURSE SIGNATURE__________________________________  ________________________________________________________________________

## 2020-05-13 NOTE — Progress Notes (Signed)
PCP -  Cardiologist -   PPM/ICD -  Device Orders -  Rep Notified -   Chest x-ray -  EKG -  Stress Test -  ECHO -  Cardiac Cath -   Sleep Study -  CPAP -   Fasting Blood Sugar -  Checks Blood Sugar _____ times a day  Blood Thinner Instructions: Aspirin Instructions:  ERAS Protcol - PRE-SURGERY Ensure or G2-   COVID TEST-    Anesthesia review:   Patient denies shortness of breath, fever, cough and chest pain at PAT appointment   All instructions explained to the patient, with a verbal understanding of the material. Patient agrees to go over the instructions while at home for a better understanding. Patient also instructed to self quarantine after being tested for COVID-19. The opportunity to ask questions was provided.   

## 2020-05-14 ENCOUNTER — Encounter (HOSPITAL_COMMUNITY)
Admission: RE | Admit: 2020-05-14 | Discharge: 2020-05-14 | Disposition: A | Discharge status: Home / Self Care | Payer: BLUE CROSS/BLUE SHIELD | Source: Ambulatory Visit | Attending: Surgery | Admitting: Surgery

## 2020-05-14 ENCOUNTER — Ambulatory Visit (INDEPENDENT_AMBULATORY_CARE_PROVIDER_SITE_OTHER): Payer: Commercial Managed Care - PPO

## 2020-05-14 DIAGNOSIS — J309 Allergic rhinitis, unspecified: Secondary | ICD-10-CM

## 2020-05-18 ENCOUNTER — Inpatient Hospital Stay (HOSPITAL_COMMUNITY): Admission: RE | Admit: 2020-05-18 | Payer: BLUE CROSS/BLUE SHIELD | Source: Ambulatory Visit

## 2020-05-19 ENCOUNTER — Ambulatory Visit (INDEPENDENT_AMBULATORY_CARE_PROVIDER_SITE_OTHER): Payer: Commercial Managed Care - PPO

## 2020-05-19 DIAGNOSIS — J309 Allergic rhinitis, unspecified: Secondary | ICD-10-CM

## 2020-05-26 ENCOUNTER — Ambulatory Visit (INDEPENDENT_AMBULATORY_CARE_PROVIDER_SITE_OTHER): Payer: Commercial Managed Care - PPO

## 2020-05-26 DIAGNOSIS — J309 Allergic rhinitis, unspecified: Secondary | ICD-10-CM | POA: Diagnosis not present

## 2020-06-02 ENCOUNTER — Ambulatory Visit (INDEPENDENT_AMBULATORY_CARE_PROVIDER_SITE_OTHER): Payer: Commercial Managed Care - PPO

## 2020-06-02 DIAGNOSIS — J309 Allergic rhinitis, unspecified: Secondary | ICD-10-CM | POA: Diagnosis not present

## 2020-06-09 ENCOUNTER — Ambulatory Visit: Payer: BLUE CROSS/BLUE SHIELD | Admitting: Endocrinology

## 2020-06-16 ENCOUNTER — Ambulatory Visit (INDEPENDENT_AMBULATORY_CARE_PROVIDER_SITE_OTHER): Payer: Commercial Managed Care - PPO

## 2020-06-16 DIAGNOSIS — J309 Allergic rhinitis, unspecified: Secondary | ICD-10-CM

## 2020-06-22 NOTE — Patient Instructions (Addendum)
DUE TO COVID-19 ONLY ONE VISITOR IS ALLOWED TO COME WITH YOU AND STAY IN THE WAITING ROOM ONLY DURING PRE OP AND PROCEDURE DAY OF SURGERY. THE 1 VISITOR  MAY VISIT WITH YOU AFTER SURGERY IN YOUR PRIVATE ROOM DURING VISITING HOURS ONLY!  YOU NEED TO HAVE A COVID 19 TEST ON___10/11____ @_2 :30 p______, THIS TEST MUST BE DONE BEFORE SURGERY,  COVID TESTING SITE [place] [address], IT IS ON THE RIGHT GOING OUT [address] APPROXIMATELY  2 MINUTES PAST [employer] ON THE RIGHT. ONCE YOUR COVID TEST IS COMPLETED,  PLEASE BEGIN THE QUARANTINE INSTRUCTIONS AS OUTLINED IN YOUR HANDOUT.                Christine Mason    Your procedure is scheduled on: 07/01/20   Report to [facility] Main  Entrance   Report to admitting at   8:00 AM     Call this number if you have problems the morning of surgery [phone]    Remember: Do not eat food or drink liquids :After Midnight.   BRUSH YOUR TEETH MORNING OF SURGERY AND RINSE YOUR MOUTH OUT, NO CHEWING GUM CANDY OR MINTS.     Take these medicines the morning of surgery with A SIP OF WATER: Venlafaxine, Protonix, Ditropan                                You may not have any metal on your body including hair pins and              piercings  Do not wear jewelry, make-up, lotions, powders or perfumes, deodorant             Do not wear nail polish on your fingernails.  Do not shave  48 hours prior to surgery.     Do not bring valuables to the hospital. [place].  Contacts, dentures or bridgework may not be worn into surgery.       Patients discharged the day of surgery will not be allowed to drive home. IF YOU ARE HAVING SURGERY AND GOING HOME THE SAME DAY, YOU MUST HAVE AN ADULT TO DRIVE YOU HOME AND BE WITH YOU FOR 24 HOURS. YOU MAY GO HOME BY TAXI OR UBER OR ORTHERWISE, BUT AN ADULT MUST ACCOMPANY YOU HOME AND STAY WITH YOU FOR 24 HOURS.  Name and phone  number of your driver:  Special Instructions: N/A              Please read over the following fact sheets you were given: _____________________________________________________________________             [facility] - Preparing for Surgery Before surgery, you can play an important role.  Because skin is not sterile, your skin needs to be as free of germs as possible.  You can reduce the number of germs on your skin by washing with CHG (chlorahexidine gluconate) soap before surgery.  CHG is an antiseptic cleaner which kills germs and bonds with the skin to continue killing germs even after washing. Please DO NOT use if you have an allergy to CHG or antibacterial soaps.  If your skin becomes reddened/irritated stop using the CHG and inform your nurse when you arrive at Short Stay. Do not shave (including legs and underarms)  for at least 48 hours prior to the first CHG shower.  You may shave your face/neck. Please follow these instructions carefully:  1.  Shower with CHG Soap the night before surgery and the  morning of Surgery.  2.  If you choose to wash your hair, wash your hair first as usual with your  normal  shampoo.  3.  After you shampoo, rinse your hair and body thoroughly to remove the  shampoo.                                        4.  Use CHG as you would any other liquid soap.  You can apply chg directly  to the skin and wash                       Gently with a scrungie or clean washcloth.  5.  Apply the CHG Soap to your body ONLY FROM THE NECK DOWN.   Do not use on face/ open                           Wound or open sores. Avoid contact with eyes, ears mouth and genitals (private parts).                       Wash face,  Genitals (private parts) with your normal soap.             6.  Wash thoroughly, paying special attention to the area where your surgery  will be performed.  7.  Thoroughly rinse your body with warm water from the neck down.  8.  DO NOT shower/wash with your normal  soap after using and rinsing off  the CHG Soap.             9.  Pat yourself dry with a clean towel.            10.  Wear clean pajamas.            11.  Place clean sheets on your bed the night of your first shower and do not  sleep with pets. Day of Surgery : Do not apply any lotions/deodorants the morning of surgery.  Please wear clean clothes to the hospital/surgery center.  FAILURE TO FOLLOW THESE INSTRUCTIONS MAY RESULT IN THE CANCELLATION OF YOUR SURGERY PATIENT SIGNATURE_________________________________  NURSE SIGNATURE__________________________________  ________________________________________________________________________

## 2020-06-23 ENCOUNTER — Ambulatory Visit (INDEPENDENT_AMBULATORY_CARE_PROVIDER_SITE_OTHER): Payer: Commercial Managed Care - PPO

## 2020-06-23 ENCOUNTER — Encounter: Payer: Self-pay | Admitting: Orthopedic Surgery

## 2020-06-23 ENCOUNTER — Ambulatory Visit (INDEPENDENT_AMBULATORY_CARE_PROVIDER_SITE_OTHER): Payer: Commercial Managed Care - PPO | Admitting: Orthopedic Surgery

## 2020-06-23 ENCOUNTER — Other Ambulatory Visit: Payer: Self-pay

## 2020-06-23 ENCOUNTER — Encounter (HOSPITAL_COMMUNITY): Payer: Self-pay

## 2020-06-23 ENCOUNTER — Encounter (HOSPITAL_COMMUNITY)
Admission: RE | Admit: 2020-06-23 | Discharge: 2020-06-23 | Disposition: A | Discharge status: Home / Self Care | Payer: Commercial Managed Care - PPO | Source: Ambulatory Visit | Attending: Surgery | Admitting: Surgery

## 2020-06-23 ENCOUNTER — Ambulatory Visit (HOSPITAL_COMMUNITY)
Admission: RE | Admit: 2020-06-23 | Discharge: 2020-06-23 | Disposition: A | Discharge status: Home / Self Care | Payer: Commercial Managed Care - PPO | Source: Ambulatory Visit | Attending: Anesthesiology | Admitting: Anesthesiology

## 2020-06-23 VITALS — BP 143/85 | HR 69 | Ht 63.0 in | Wt 275.0 lb

## 2020-06-23 DIAGNOSIS — G5603 Carpal tunnel syndrome, bilateral upper limbs: Secondary | ICD-10-CM

## 2020-06-23 DIAGNOSIS — Z01818 Encounter for other preprocedural examination: Secondary | ICD-10-CM

## 2020-06-23 DIAGNOSIS — J309 Allergic rhinitis, unspecified: Secondary | ICD-10-CM | POA: Diagnosis not present

## 2020-06-23 HISTORY — DX: Carpal tunnel syndrome, unspecified upper limb: G56.00

## 2020-06-23 HISTORY — DX: Cardiac murmur, unspecified: R01.1

## 2020-06-23 HISTORY — DX: Personal history of urinary calculi: Z87.442

## 2020-06-23 HISTORY — DX: Anxiety disorder, unspecified: F41.9

## 2020-06-23 LAB — BASIC METABOLIC PANEL
Anion gap: 11 (ref 5–15)
BUN: 14 mg/dL (ref 6–20)
CO2: 29 mmol/L (ref 22–32)
Calcium: 9.5 mg/dL (ref 8.9–10.3)
Chloride: 99 mmol/L (ref 98–111)
Creatinine, Ser: 0.81 mg/dL (ref 0.44–1.00)
GFR calc non Af Amer: >60 mL/min (ref >60)
Glucose, Bld: 91 mg/dL (ref 70–99)
Potassium: 3.9 mmol/L (ref 3.5–5.1)
Sodium: 139 mmol/L (ref 135–145)

## 2020-06-23 LAB — CBC
HCT: 42.2 % (ref 36.0–46.0)
Hemoglobin: 13.6 g/dL (ref 12.0–15.0)
MCH: 30.4 pg (ref 26.0–34.0)
MCHC: 32.2 g/dL (ref 30.0–36.0)
MCV: 94.4 fL (ref 80.0–100.0)
Platelets: 325 10*3/uL (ref 150–400)
RBC: 4.47 MIL/uL (ref 3.87–5.11)
RDW: 13.8 % (ref 11.5–15.5)
WBC: 7.9 10*3/uL (ref 4.0–10.5)
nRBC: 0 % (ref 0.0–0.2)

## 2020-06-23 MED ORDER — GABAPENTIN 300 MG PO CAPS: 300.0000 mg | ORAL_CAPSULE | Freq: Three times a day (TID) | ORAL | 5 refills | Status: DC

## 2020-06-23 NOTE — Patient Instructions (Addendum)
Call when available for carpal tunnel surgery  Carpal Tunnel Syndrome  Carpal tunnel syndrome is a condition that causes pain in your hand and arm. The carpal tunnel is a narrow area that is on the palm side of your wrist. Repeated wrist motion or certain diseases may cause swelling in the tunnel. This swelling can pinch the main nerve in the wrist (median nerve). What are the causes? This condition may be caused by:  Repeated wrist motions.  Wrist injuries.  Arthritis.  A sac of fluid (cyst) or abnormal growth (tumor) in the carpal tunnel.  Fluid buildup during pregnancy. Sometimes the cause is not known. What increases the risk? The following factors may make you more likely to develop this condition:  Having a job in which you move your wrist in the same way many times. This includes jobs like being a Software engineer or a Scientist, water quality.  Being a woman.  Having other health conditions, such as: ? Diabetes. ? Obesity. ? A thyroid gland that is not active enough (hypothyroidism). ? Kidney failure. What are the signs or symptoms? Symptoms of this condition include:  A tingling feeling in your fingers.  Tingling or a loss of feeling (numbness) in your hand.  Pain in your entire arm. This pain may get worse when you bend your wrist and elbow for a long time.  Pain in your wrist that goes up your arm to your shoulder.  Pain that goes down into your palm or fingers.  A weak feeling in your hands. You may find it hard to grab and hold items. You may feel worse at night. How is this diagnosed? This condition is diagnosed with a medical history and physical exam. You may also have tests, such as:  Electromyogram (EMG). This test checks the signals that the nerves send to the muscles.  Nerve conduction study. This test checks how well signals pass through your nerves.  Imaging tests, such as X-rays, ultrasound, and MRI. These tests check for what might be the cause of your condition. How  is this treated? This condition may be treated with:  Lifestyle changes. You will be asked to stop or change the activity that caused your problem.  Doing exercise and activities that make bones and muscles stronger (physical therapy).  Learning how to use your hand again (occupational therapy).  Medicines for pain and swelling (inflammation). You may have injections in your wrist.  A wrist splint.  Surgery. Follow these instructions at home: If you have a splint:  Wear the splint as told by your doctor. Remove it only as told by your doctor.  Loosen the splint if your fingers: ? Tingle. ? Lose feeling (become numb). ? Turn cold and blue.  Keep the splint clean.  If the splint is not waterproof: ? Do not let it get wet. ? Cover it with a watertight covering when you take a bath or a shower. Managing pain, stiffness, and swelling   If told, put ice on the painful area: ? If you have a removable splint, remove it as told by your doctor. ? Put ice in a plastic bag. ? Place a towel between your skin and the bag. ? Leave the ice on for 20 minutes, 2-3 times per day. General instructions  Take over-the-counter and prescription medicines only as told by your doctor.  Rest your wrist from any activity that may cause pain. If needed, talk with your boss at work about changes that can help your wrist heal.  Do any exercises as told by your doctor, physical therapist, or occupational therapist.  Keep all follow-up visits as told by your doctor. This is important. Contact a doctor if:  You have new symptoms.  Medicine does not help your pain.  Your symptoms get worse. Get help right away if:  You have very bad numbness or tingling in your wrist or hand. Summary  Carpal tunnel syndrome is a condition that causes pain in your hand and arm.  It is often caused by repeated wrist motions.  Lifestyle changes and medicines are used to treat this problem. Surgery may help in  very bad cases.  Follow your doctor's instructions about wearing a splint, resting your wrist, keeping follow-up visits, and calling for help. This information is not intended to replace advice given to you by your health care provider. Make sure you discuss any questions you have with your health care provider. Document Revised: 01/11/2018 Document Reviewed: 01/11/2018 Elsevier Patient Education  [employer].

## 2020-06-23 NOTE — Progress Notes (Signed)
NEW PROBLEM//OFFICE VISIT  Chief Complaint  Patient presents with  . Carpal Tunnel    bilateral     This is a 49 year old female presents with bilateral carpal tunnel syndrome.  She states she had a nerve conduction study but she not sure where it was done in her primary care doctor does not have a copy  She complains of burning stinging and numbness in both hands involving all digits except a small finger.  She has had it for several months now.  She has trouble holding things has night pain that wakes her up.  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  Musculoskeletal: Positive for joint pain and myalgias.  Skin: Positive for itching.  Psychiatric/Behavioral: Positive for depression.     Past Medical History:  Diagnosis Date  . Chronic leg pain    right  . Chronic pain of left knee   . Depression   . Diverticulitis   . GERD (gastroesophageal reflux disease)   . Hypertension   . Hyperthyroidism   . Sleep apnea     Past Surgical History:  Procedure Laterality Date  . ACNE CYST REMOVAL    . CHOLECYSTECTOMY    . DILATION AND CURETTAGE OF UTERUS  1998  . ESOPHAGOGASTRODUODENOSCOPY (EGD) WITH PROPOFOL N/A 06/13/2019   Procedure: ESOPHAGOGASTRODUODENOSCOPY (EGD) WITH PROPOFOL;  Surgeon: Rogene Houston, MD;  [facility];  Service: Endoscopy;  Laterality: N/A;  7:30  . KNEE SURGERY    . MALONEY DILATION  06/13/2019   Procedure: MALONEY DILATION;  Surgeon: Rogene Houston, MD;  Location: [facility];  Service: Endoscopy;;  . MASS EXCISION      Family History  Problem Relation Age of Onset  . Diabetes Mother   . Hypertension Mother   . Breast cancer Mother 4        dx again at 49; PALB2 and CHEK2 positive  . Diabetes Father   . Hypertension Father   . Coronary artery disease Father   . Hypertension Sister   . Breast cancer Sister        diagnosed in her 52's  . Thyroid cancer Maternal Uncle 46       medullary  . Lung cancer Maternal Grandmother   . Leukemia Maternal Grandfather 61       dx with hairy cell leukemia at 40; CLL at 92  . Lymphoma Maternal Grandfather 82       hodgkins lymphoma  . Brain cancer Paternal Grandmother   . Prostate cancer Paternal Grandfather   . Lung cancer Maternal Aunt 6  . Lymphoma Maternal Uncle 60       follicular lymphoma  . Allergic rhinitis Neg Hx   . Asthma Neg Hx    Social History   Tobacco Use  . Smoking status: Former Smoker    Packs/day: 0.50    Years: 5.00    Pack years: 2.50    Types: Cigarettes    Quit date: 09/15/2017    Years since quitting: 2.7  . Smokeless tobacco: Never Used  Vaping Use  . Vaping Use: Never used  Substance Use Topics  . Alcohol use: No  . Drug use: No  Allergies  Allergen Reactions  . Penicillins Rash    Has patient had a PCN reaction causing immediate rash, facial/tongue/throat swelling, SOB or lightheadedness with hypotension: {Yes Has patient had a PCN reaction causing severe rash involving mucus membranes or skin necrosis:unknown Has patient had a PCN reaction that required hospitalizationNo Has patient had a PCN reaction occurring within the last 10 years:No If all of the above answers are "NO", then may proceed with Cephalosporin use.     Current Meds  Medication Sig  . atenolol (TENORMIN) 50 MG tablet Take 1 tablet (50 mg total) by mouth daily. (Patient taking differently: Take 50 mg by mouth at bedtime. )  . Cholecalciferol (VITAMIN D3) 50 MCG (2000 UT) TABS Take 2,000 Units by mouth daily.  Marland Kitchen EPINEPHrine 0.3 mg/0.3 mL IJ SOAJ injection Inject 0.3 mg into the muscle as needed for anaphylaxis.   . hydrochlorothiazide (HYDRODIURIL) 25 MG tablet  Take 25 mg by mouth daily.   Marland Kitchen HYDROcodone-acetaminophen (NORCO/VICODIN) 5-325 MG tablet Take 1 tablet by mouth at bedtime.   Marland Kitchen levocetirizine (XYZAL) 5 MG tablet Take 1 tablet (5 mg total) by mouth every morning. (Patient taking differently: Take 5 mg by mouth in the morning and at bedtime. )  . LORazepam (ATIVAN) 1 MG tablet Take 1 mg by mouth at bedtime.   Marland Kitchen losartan (COZAAR) 100 MG tablet Take 100 mg by mouth daily.   . meloxicam (MOBIC) 15 MG tablet Take 15 mg by mouth daily.  . methimazole (TAPAZOLE) 10 MG tablet Take 1 tablet (10 mg total) by mouth daily. (Patient taking differently: Take 10 mg by mouth at bedtime. )  . methocarbamol (ROBAXIN-750) 750 MG tablet Take 1 tablet (750 mg total) by mouth at bedtime.  . montelukast (SINGULAIR) 10 MG tablet Take 1 tablet (10 mg total) by mouth at bedtime.  . ondansetron (ZOFRAN) 4 MG tablet Take 1 tablet (4 mg total) by mouth every 8 (eight) hours as needed for nausea or vomiting.  Marland Kitchen oxybutynin (DITROPAN) 5 MG tablet Take 5 mg by mouth 2 (two) times daily.  . pantoprazole (PROTONIX) 40 MG tablet Take 40 mg by mouth 2 (two) times daily.    Marland Kitchen venlafaxine XR (EFFEXOR-XR) 75 MG 24 hr capsule Take 225 mg by mouth daily.    BP (!) 143/85   Pulse 69   Ht 5' 3"  (1.6 m)   Wt 275 lb (124.7 kg)   LMP 05/22/2015   BMI 48.71 kg/m   Physical Exam Constitutional:      General: She is not in acute distress.    Appearance: She is well-developed.  Cardiovascular:     Comments: No peripheral edema Skin:    General: Skin is warm and dry.  Neurological:     Mental Status: She is alert and oriented to person, place, and time.     Sensory: No sensory deficit.     Coordination: Coordination normal.     Gait: Gait normal.     Deep Tendon Reflexes: Reflexes are normal and symmetric.     Ortho Exam Right and left hand examination  She has no atrophy there is no tenderness over the carpal tunnel negative Tinel's she has normal two-point discrimination  normal soft touch discrimination normal sharp touch discrimination  She has normal grip strength  Color capillary refill are normal   MEDICAL DECISION MAKING  A.  Encounter Diagnosis  Name Primary?  . Bilateral carpal tunnel syndrome Yes    B. DATA ANALYSED:   IMAGING:  Interpretation of images: No x-rays Orders: No new orders Outside records reviewed: No outside records  C. MANAGEMENT   Vitamin B 600 mg twice a day    Meds ordered this encounter  Medications  . gabapentin (NEURONTIN) 300 MG capsule    Sig: Take 1 capsule (300 mg total) by mouth 3 (three) times daily.    Dispense:  90 capsule    Refill:  5   When the thyroid surgery is over patient can call us and let us know that she is ready for carpal tunnel release on the right     Arther Abbott, MD  06/23/2020 8:57 AM

## 2020-06-23 NOTE — Progress Notes (Signed)
COVID Vaccine Completed:Yes Date COVID Vaccine completed:10/25/19 COVID vaccine manufacturer:   Moderna     PCP -Dr. Lenetta Quaker  Cardiologist - none  Chest x-ray - 06/23/20 EKG - 06/23/20 Stress Test - no ECHO - Pt thinks she had one done in 2018 with an [facility] cardiologist in [place] [facility] Cardiac Cath - no Pacemaker/ICD device last checked:NA  Sleep Study - yes. Pt states that she needs one but is having insurance issues CPAP - no  Fasting Blood Sugar - NA Checks Blood Sugar _____ times a day  Blood Thinner Instructions:NA Aspirin Instructions: Last Dose:  Anesthesia review:   Patient denies shortness of breath, fever, cough and chest pain at PAT appointment yes  Patient verbalized understanding of instructions that were given to them at the PAT appointment. Patient was also instructed that they will need to review over the PAT instructions again at home before surgery. Yes  Pt does get SOB after 1 flight of stairs and doing housework but not with ADLs.

## 2020-06-24 DIAGNOSIS — J3089 Other allergic rhinitis: Secondary | ICD-10-CM | POA: Diagnosis not present

## 2020-06-24 NOTE — Progress Notes (Signed)
Vials exp 06-24-21

## 2020-06-28 ENCOUNTER — Other Ambulatory Visit (HOSPITAL_COMMUNITY)
Admission: RE | Admit: 2020-06-28 | Discharge: 2020-06-28 | Disposition: A | Discharge status: Home / Self Care | Payer: Commercial Managed Care - PPO | Source: Ambulatory Visit | Attending: Surgery | Admitting: Surgery

## 2020-06-28 ENCOUNTER — Other Ambulatory Visit (HOSPITAL_COMMUNITY): Payer: BLUE CROSS/BLUE SHIELD

## 2020-06-28 ENCOUNTER — Other Ambulatory Visit: Payer: Self-pay

## 2020-06-28 ENCOUNTER — Ambulatory Visit
Admission: RE | Admit: 2020-06-28 | Discharge: 2020-06-28 | Disposition: A | Discharge status: Home / Self Care | Payer: Commercial Managed Care - PPO | Source: Ambulatory Visit | Attending: Hematology and Oncology | Admitting: Hematology and Oncology

## 2020-06-28 DIAGNOSIS — Z20822 Contact with and (suspected) exposure to covid-19: Secondary | ICD-10-CM | POA: Diagnosis not present

## 2020-06-28 DIAGNOSIS — Z1501 Genetic susceptibility to malignant neoplasm of breast: Secondary | ICD-10-CM

## 2020-06-28 DIAGNOSIS — Z01812 Encounter for preprocedural laboratory examination: Secondary | ICD-10-CM | POA: Insufficient documentation

## 2020-06-28 MED ORDER — GADOBUTROL 1 MMOL/ML IV SOLN
10.0000 mL | Freq: Once | INTRAVENOUS | Status: AC | PRN
  Administered 2020-06-28: 10 mL via INTRAVENOUS

## 2020-06-29 ENCOUNTER — Other Ambulatory Visit (HOSPITAL_COMMUNITY): Payer: BLUE CROSS/BLUE SHIELD

## 2020-06-29 ENCOUNTER — Encounter (HOSPITAL_COMMUNITY): Payer: Self-pay | Admitting: Surgery

## 2020-06-29 DIAGNOSIS — E042 Nontoxic multinodular goiter: Secondary | ICD-10-CM | POA: Diagnosis present

## 2020-06-29 DIAGNOSIS — D440 Neoplasm of uncertain behavior of thyroid gland: Secondary | ICD-10-CM | POA: Diagnosis present

## 2020-06-29 LAB — SARS CORONAVIRUS 2 (TAT 6-24 HRS): SARS Coronavirus 2: NEGATIVE

## 2020-06-29 NOTE — H&P (Signed)
General Surgery [facility]  Christine Mason DOB: 11-12-1970 Married / Language: [name] / Race: White Female   History of Present Illness   The patient is a 49 year old female who presents with a thyroid nodule.  CHIEF COMPLAINT: thyroid neoplasm of uncertain behavior, multiple thyroid nodules  Patient is referred by Dr. Wende Mason for surgical evaluation and management of thyroid neoplasm of uncertain behavior and multiple thyroid nodules. Patient was first noted to have abnormal TSH level. This appears to have been markedly suppressed at 0.03. Other levels appear low normal at 0.838. As a result the patient underwent a thyroid ultrasound on February 25, 2020. This demonstrated bilateral thyroid nodules. There was a dominant nodule in the right lobe measuring 3.4 cm and a nodule in the left lobe measuring 1.5 cm, both of which met criteria for biopsy. Fine-needle aspiration biopsy was performed on March 10, 2020. Inadequate sampling was obtained from the right sided nodule. However the left inferior nodule measuring 1.5 cm showed cytologic atypia. Molecular genetic testing yielded a result of suspicious with a 50% risk of malignancy. 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 Patient has had no prior history of thyroid disease. She has never been on thyroid medication. There are no immediate family members with thyroid disease or thyroid cancer. There is a family member with a history of medullary thyroid carcinoma. Patient does note some mild dysphagia. She has had no prior head or neck surgery. 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   Problem List/Past [place] (D44.0)  MULTIPLE THYROID NODULES (E04.2)   Past Surgical History  Gallbladder Surgery - Laparoscopic  Knee Surgery  Bilateral.  Diagnostic  Studies History  Colonoscopy  1-5 years ago Mammogram  within last year Pap Smear  1-5 years ago  Allergies  Penicillamine *ASSORTED CLASSES*  Allergies Reconciled   Medication History  Meloxicam (15MG Tablet, Oral) Active. LORazepam (1MG Tablet, Oral) Active. hydroCHLOROthiazide (25MG Tablet, Oral) Active. Losartan Potassium (100MG Tablet, Oral) Active. Atenolol (50MG Tablet, Oral) Active. Ondansetron HCl (4MG Tablet, Oral) Active. Methocarbamol (750MG Tablet, Oral) Active. Venlafaxine HCl ER (75MG Capsule ER 24HR, Oral) Active. Ditropan XL (10MG Tablet ER 24HR, Oral) Active. Protonix (40MG Tablet DR, Oral) Active. Singulair (10MG Tablet, Oral) Active. HYDROcodone Bitartrate ER (Oral) Specific strength unknown - Active. Gabapentin (300MG/6ML Solution, Oral) Active. Lisinopril-hydroCHLOROthiazide (20-25MG Tablet, Oral) Active. Xanax (0.5MG Tablet, Oral) Active. Effexor (75MG Tablet, Oral) Active. CloNIDine HCl (0.2MG Tablet, Oral) Active. Lisinopril (10MG Tablet, Oral) Active. Protonix (20MG Tablet DR, Oral) Active. TraZODone HCl (150MG Tablet, Oral) Active. Hydrocodone-Acetaminophen (2.5-500MG Tablet, Oral) Active. Medications Reconciled  Social History  Caffeine use  Carbonated beverages, Coffee. No drug use  Tobacco use  Current some day smoker, Former smoker. Alcohol use  Occasional alcohol use.  Family History  Arthritis  Mother. Breast Cancer  Mother, Sister. Cancer  Family Members In General. Cerebrovascular Accident  Father. Colon Polyps  Father. Depression  Father, Mother, Sister. Diabetes Mellitus  Father, Mother. Heart Disease  Father. Hypertension  Father, Mother, Sister. Prostate Cancer  Family Members In General. Thyroid problems  Family Members In General. Heart disease in female family member before age 28   Pregnancy / Birth History  Age at menarche  43 years. Age of menopause  34-50 Gravida   1 Irregular periods  Maternal age  48-30 Para  0  Other Problems Anxiety Disorder  Arthritis  Back Pain  Cholelithiasis  Depression  Gastroesophageal Reflux Disease  Heart murmur  High blood pressure  Kidney Stone  Sleep Apnea  Thyroid Disease  Chest pain  Diverticulosis  UMBILICAL HERNIA, INCARCERATED (K42.0)     Review of Systems  General Present- Fatigue, Night Sweats and Weight Gain. Not Present- Appetite Loss, Chills, Fever and Weight Loss. Skin Not Present- Change in Wart/Mole, Dryness, Hives, Jaundice, New Lesions, Non-Healing Wounds, Rash and Ulcer. HEENT Present- Wears glasses/contact lenses. Not Present- Earache, Hearing Loss, Hoarseness, Nose Bleed, Oral Ulcers, Ringing in the Ears, Seasonal Allergies, Sinus Pain, Sore Throat, Visual Disturbances and Yellow Eyes. Respiratory Present- Snoring. Not Present- Bloody sputum, Chronic Cough, Difficulty Breathing and Wheezing. Breast Not Present- Breast Mass, Breast Pain, Nipple Discharge and Skin Changes. Cardiovascular Present- Palpitations, Rapid Heart Rate and Swelling of Extremities. Not Present- Chest Pain, Difficulty Breathing Lying Down, Leg Cramps and Shortness of Breath. Gastrointestinal Present- Difficulty Swallowing. Not Present- Abdominal Pain, Bloating, Bloody Stool, Change in Bowel Habits, Chronic diarrhea, Constipation, Excessive gas, Gets full quickly at meals, Hemorrhoids, Indigestion, Nausea, Rectal Pain and Vomiting. Female Genitourinary Not Present- Frequency, Nocturia, Painful Urination, Pelvic Pain and Urgency. Musculoskeletal Present- Back Pain. Not Present- Joint Pain, Joint Stiffness, Muscle Pain, Muscle Weakness and Swelling of Extremities. Neurological Present- Numbness and Tingling. Not Present- Decreased Memory, Fainting, Headaches, Seizures, Tremor, Trouble walking and Weakness. Psychiatric Present- Depression. Not Present- Anxiety, Bipolar, Change in Sleep Pattern, Fearful and  Frequent crying. Endocrine Present- Hot flashes. Not Present- Cold Intolerance, Excessive Hunger, Hair Changes, Heat Intolerance and New Diabetes. Hematology Not Present- Blood Thinners, Easy Bruising, Excessive bleeding, Gland problems, HIV and Persistent Infections.  Vitals  Weight: 270.25 lb Height: 63in Body Surface Area: 2.2 m Body Mass Index: 47.87 kg/m  Temp.: 98.51F  Pulse: 87 (Regular)  P.OX: 97% (Room air) BP: 118/74(Sitting, Left Arm, Standard)  Physical Exam   GENERAL APPEARANCE Development: normal Nutritional status: normal Gross deformities: none  SKIN Rash, lesions, ulcers: none Induration, erythema: none Nodules: none palpable  EYES Conjunctiva and lids: normal Pupils: equal and reactive Iris: normal bilaterally  EARS, NOSE, MOUTH, THROAT External ears: no lesion or deformity External nose: no lesion or deformity Hearing: grossly normal Due to Covid-19 pandemic, patient is wearing a mask.  NECK Symmetric: yes Trachea: midline Thyroid: In the right thyroid lobe is a dominant 3 cm nodule which is smooth, firm, and mobile with swallowing. It is nontender. Palpation of the left thyroid lobe shows no palpable nodularity. There is no associated lymphadenopathy.  CHEST Respiratory effort: normal Retraction or accessory muscle use: no Breath sounds: normal bilaterally Rales, rhonchi, wheeze: none  CARDIOVASCULAR Auscultation: regular rhythm, normal rate Murmurs: none Pulses: radial pulse 2+ palpable Lower extremity edema: none  MUSCULOSKELETAL Station and gait: normal Digits and nails: no clubbing or cyanosis Muscle strength: grossly normal all extremities Range of motion: grossly normal all extremities Deformity: none  LYMPHATIC Cervical: none palpable Supraclavicular: none palpable  PSYCHIATRIC Oriented to person, place, and time: yes Mood and affect: normal for situation Judgment and insight: appropriate for  situation    Assessment & Plan   NEOPLASM OF UNCERTAIN BEHAVIOR OF THYROID GLAND (D44.0) MULTIPLE THYROID NODULES (E04.2)  Patient is referred by her primary care physician for surgical evaluation and management of bilateral thyroid nodules and thyroid neoplasm of uncertain behavior.  Patient provided with a copy of "The Thyroid Book: Medical and Surgical Treatment of Thyroid Problems", published by [employer], 16 pages. Book  reviewed and explained to patient during visit today.  Patient has bilateral thyroid nodules. The left inferior nodule measuring 1.5 cm shows cytologic atypia on fine needle aspiration biopsy and has a molecular genetic profile of increased risk for malignancy. I have recommended proceeding with total thyroidectomy for definitive diagnosis and management. This would also evaluate the dominant right-sided nodule for which fine-needle aspiration sampling was inadequate. We discussed the risk and benefits of total thyroidectomy including the risk of injury to recurrent laryngeal nerve and to her thyroid glands. We discussed the location and size of the surgical incision. We discussed the hospital stay to be anticipated. We discussed the need for lifelong thyroid hormone replacement. We discussed the postoperative recovery and time out of work. Patient understands and wishes to proceed with surgery in the near future.  Patient does have a consultation arranged with Dr. Renato Shin in the near future.  The risks and benefits of the procedure have been discussed at length with the patient. The patient understands the proposed procedure, potential alternative treatments, and the course of recovery to be expected. All of the patient's questions have been answered at this time. The patient wishes to proceed with surgery.  Christine Gemma, MD [facility] Office: [phone]

## 2020-07-01 ENCOUNTER — Encounter (HOSPITAL_COMMUNITY)
Admission: RE | Disposition: A | Discharge status: Home / Self Care | Payer: Self-pay | Source: Ambulatory Visit | Attending: Surgery

## 2020-07-01 ENCOUNTER — Ambulatory Visit (HOSPITAL_COMMUNITY): Payer: Commercial Managed Care - PPO | Admitting: Anesthesiology

## 2020-07-01 ENCOUNTER — Ambulatory Visit (HOSPITAL_COMMUNITY)
Admission: RE | Admit: 2020-07-01 | Discharge: 2020-07-02 | Disposition: A | Discharge status: Home / Self Care | Payer: Commercial Managed Care - PPO | Source: Ambulatory Visit | Attending: Surgery | Admitting: Surgery

## 2020-07-01 ENCOUNTER — Encounter (HOSPITAL_COMMUNITY): Payer: Self-pay | Admitting: Surgery

## 2020-07-01 ENCOUNTER — Other Ambulatory Visit: Payer: Self-pay

## 2020-07-01 DIAGNOSIS — F32A Depression, unspecified: Secondary | ICD-10-CM | POA: Insufficient documentation

## 2020-07-01 DIAGNOSIS — Z8249 Family history of ischemic heart disease and other diseases of the circulatory system: Secondary | ICD-10-CM | POA: Insufficient documentation

## 2020-07-01 DIAGNOSIS — Z9049 Acquired absence of other specified parts of digestive tract: Secondary | ICD-10-CM | POA: Insufficient documentation

## 2020-07-01 DIAGNOSIS — Z818 Family history of other mental and behavioral disorders: Secondary | ICD-10-CM | POA: Insufficient documentation

## 2020-07-01 DIAGNOSIS — Z87442 Personal history of urinary calculi: Secondary | ICD-10-CM | POA: Insufficient documentation

## 2020-07-01 DIAGNOSIS — M1990 Unspecified osteoarthritis, unspecified site: Secondary | ICD-10-CM | POA: Diagnosis not present

## 2020-07-01 DIAGNOSIS — D440 Neoplasm of uncertain behavior of thyroid gland: Secondary | ICD-10-CM

## 2020-07-01 DIAGNOSIS — M549 Dorsalgia, unspecified: Secondary | ICD-10-CM | POA: Diagnosis not present

## 2020-07-01 DIAGNOSIS — E042 Nontoxic multinodular goiter: Secondary | ICD-10-CM | POA: Insufficient documentation

## 2020-07-01 DIAGNOSIS — Z833 Family history of diabetes mellitus: Secondary | ICD-10-CM | POA: Insufficient documentation

## 2020-07-01 DIAGNOSIS — Z803 Family history of malignant neoplasm of breast: Secondary | ICD-10-CM | POA: Diagnosis not present

## 2020-07-01 DIAGNOSIS — C73 Malignant neoplasm of thyroid gland: Secondary | ICD-10-CM | POA: Insufficient documentation

## 2020-07-01 DIAGNOSIS — Z79899 Other long term (current) drug therapy: Secondary | ICD-10-CM | POA: Diagnosis not present

## 2020-07-01 DIAGNOSIS — Z791 Long term (current) use of non-steroidal anti-inflammatories (NSAID): Secondary | ICD-10-CM | POA: Insufficient documentation

## 2020-07-01 DIAGNOSIS — Z809 Family history of malignant neoplasm, unspecified: Secondary | ICD-10-CM | POA: Insufficient documentation

## 2020-07-01 DIAGNOSIS — Z87891 Personal history of nicotine dependence: Secondary | ICD-10-CM | POA: Insufficient documentation

## 2020-07-01 DIAGNOSIS — Z880 Allergy status to penicillin: Secondary | ICD-10-CM | POA: Insufficient documentation

## 2020-07-01 DIAGNOSIS — K5790 Diverticulosis of intestine, part unspecified, without perforation or abscess without bleeding: Secondary | ICD-10-CM | POA: Diagnosis not present

## 2020-07-01 DIAGNOSIS — K219 Gastro-esophageal reflux disease without esophagitis: Secondary | ICD-10-CM | POA: Insufficient documentation

## 2020-07-01 DIAGNOSIS — Z8042 Family history of malignant neoplasm of prostate: Secondary | ICD-10-CM | POA: Insufficient documentation

## 2020-07-01 DIAGNOSIS — Z8261 Family history of arthritis: Secondary | ICD-10-CM | POA: Diagnosis not present

## 2020-07-01 DIAGNOSIS — Z823 Family history of stroke: Secondary | ICD-10-CM | POA: Insufficient documentation

## 2020-07-01 DIAGNOSIS — C771 Secondary and unspecified malignant neoplasm of intrathoracic lymph nodes: Secondary | ICD-10-CM | POA: Insufficient documentation

## 2020-07-01 DIAGNOSIS — Z8371 Family history of colonic polyps: Secondary | ICD-10-CM | POA: Insufficient documentation

## 2020-07-01 DIAGNOSIS — G4730 Sleep apnea, unspecified: Secondary | ICD-10-CM | POA: Diagnosis not present

## 2020-07-01 DIAGNOSIS — F419 Anxiety disorder, unspecified: Secondary | ICD-10-CM | POA: Insufficient documentation

## 2020-07-01 DIAGNOSIS — R1310 Dysphagia, unspecified: Secondary | ICD-10-CM | POA: Diagnosis not present

## 2020-07-01 DIAGNOSIS — Z8349 Family history of other endocrine, nutritional and metabolic diseases: Secondary | ICD-10-CM | POA: Insufficient documentation

## 2020-07-01 HISTORY — PX: THYROIDECTOMY: SHX17

## 2020-07-01 SURGERY — THYROIDECTOMY
Anesthesia: General

## 2020-07-01 MED ORDER — ONDANSETRON HCL 4 MG/2ML IJ SOLN
INTRAMUSCULAR | Status: AC
  Filled 2020-07-01: qty 2

## 2020-07-01 MED ORDER — ACETAMINOPHEN 650 MG RE SUPP: 650.0000 mg | Freq: Four times a day (QID) | RECTAL | Status: DC | PRN

## 2020-07-01 MED ORDER — PANTOPRAZOLE SODIUM 40 MG PO TBEC
40.0000 mg | DELAYED_RELEASE_TABLET | Freq: Two times a day (BID) | ORAL | Status: DC
  Administered 2020-07-01 – 2020-07-02 (×2): 40 mg via ORAL
  Filled 2020-07-01 (×2): qty 1

## 2020-07-01 MED ORDER — ONDANSETRON HCL 4 MG/2ML IJ SOLN
INTRAMUSCULAR | Status: DC | PRN
  Administered 2020-07-01 (×2): 4 mg via INTRAVENOUS

## 2020-07-01 MED ORDER — FENTANYL CITRATE (PF) 100 MCG/2ML IJ SOLN
INTRAMUSCULAR | Status: DC | PRN
Start: 2020-07-01 — End: 2020-07-01
  Administered 2020-07-01: 100 ug via INTRAVENOUS

## 2020-07-01 MED ORDER — ONDANSETRON HCL 4 MG/2ML IJ SOLN: 4.0000 mg | Freq: Four times a day (QID) | INTRAMUSCULAR | Status: DC | PRN

## 2020-07-01 MED ORDER — SCOPOLAMINE 1 MG/3DAYS TD PT72
MEDICATED_PATCH | TRANSDERMAL | Status: AC
  Filled 2020-07-01: qty 1

## 2020-07-01 MED ORDER — ORAL CARE MOUTH RINSE: 15.0000 mL | Freq: Once | OROMUCOSAL | Status: AC

## 2020-07-01 MED ORDER — MIDAZOLAM HCL 2 MG/2ML IJ SOLN
1.0000 mg | Freq: Once | INTRAMUSCULAR | Status: AC
  Administered 2020-07-01: 1 mg via INTRAVENOUS
  Filled 2020-07-01: qty 2

## 2020-07-01 MED ORDER — CHLORHEXIDINE GLUCONATE CLOTH 2 % EX PADS: 6.0000 | MEDICATED_PAD | Freq: Once | CUTANEOUS | Status: DC

## 2020-07-01 MED ORDER — LORAZEPAM 1 MG PO TABS
1.0000 mg | ORAL_TABLET | Freq: Every day | ORAL | Status: DC
  Administered 2020-07-01: 1 mg via ORAL
  Filled 2020-07-01: qty 1

## 2020-07-01 MED ORDER — HYDROMORPHONE HCL 1 MG/ML IJ SOLN
INTRAMUSCULAR | Status: AC
  Filled 2020-07-01: qty 1

## 2020-07-01 MED ORDER — MIDAZOLAM HCL 2 MG/2ML IJ SOLN
INTRAMUSCULAR | Status: AC
  Filled 2020-07-01: qty 2

## 2020-07-01 MED ORDER — CALCIUM CARBONATE 1250 (500 CA) MG PO TABS
2.0000 | ORAL_TABLET | Freq: Three times a day (TID) | ORAL | Status: DC
  Administered 2020-07-01 – 2020-07-02 (×2): 1000 mg via ORAL
  Filled 2020-07-01 (×2): qty 1

## 2020-07-01 MED ORDER — GABAPENTIN 300 MG PO CAPS
300.0000 mg | ORAL_CAPSULE | Freq: Three times a day (TID) | ORAL | Status: DC
  Administered 2020-07-01 – 2020-07-02 (×3): 300 mg via ORAL
  Filled 2020-07-01 (×3): qty 1

## 2020-07-01 MED ORDER — SODIUM CHLORIDE 0.9 % IV SOLN
INTRAVENOUS | Status: DC | PRN
  Administered 2020-07-01: 120 ug via INTRAVENOUS
  Administered 2020-07-01: 40 ug via INTRAVENOUS
  Administered 2020-07-01: 80 ug via INTRAVENOUS
  Administered 2020-07-01: 40 ug via INTRAVENOUS

## 2020-07-01 MED ORDER — ONDANSETRON 4 MG PO TBDP: 4.0000 mg | ORAL_TABLET | Freq: Four times a day (QID) | ORAL | Status: DC | PRN

## 2020-07-01 MED ORDER — HYDROMORPHONE HCL 1 MG/ML IJ SOLN
0.2500 mg | INTRAMUSCULAR | Status: DC | PRN
  Administered 2020-07-01 (×4): 0.5 mg via INTRAVENOUS

## 2020-07-01 MED ORDER — 0.9 % SODIUM CHLORIDE (POUR BTL) OPTIME
TOPICAL | Status: DC | PRN
  Administered 2020-07-01: 1000 mL

## 2020-07-01 MED ORDER — LIDOCAINE HCL (CARDIAC) PF 100 MG/5ML IV SOSY
PREFILLED_SYRINGE | INTRAVENOUS | Status: DC | PRN
  Administered 2020-07-01: 100 mg via INTRAVENOUS

## 2020-07-01 MED ORDER — OXYCODONE HCL 5 MG PO TABS
5.0000 mg | ORAL_TABLET | ORAL | Status: DC | PRN
  Administered 2020-07-01 – 2020-07-02 (×5): 10 mg via ORAL
  Filled 2020-07-01 (×5): qty 2

## 2020-07-01 MED ORDER — LOSARTAN POTASSIUM 50 MG PO TABS
100.0000 mg | ORAL_TABLET | Freq: Every day | ORAL | Status: DC
  Administered 2020-07-02: 100 mg via ORAL
  Filled 2020-07-01: qty 2

## 2020-07-01 MED ORDER — SODIUM CHLORIDE 0.45 % IV SOLN: INTRAVENOUS | Status: DC

## 2020-07-01 MED ORDER — MEPERIDINE HCL 50 MG/ML IJ SOLN: 6.2500 mg | INTRAMUSCULAR | Status: DC | PRN

## 2020-07-01 MED ORDER — ROCURONIUM BROMIDE 100 MG/10ML IV SOLN
INTRAVENOUS | Status: DC | PRN
  Administered 2020-07-01: 100 mg via INTRAVENOUS

## 2020-07-01 MED ORDER — ATENOLOL 50 MG PO TABS
50.0000 mg | ORAL_TABLET | Freq: Every day | ORAL | Status: DC
  Administered 2020-07-01: 50 mg via ORAL
  Filled 2020-07-01: qty 1

## 2020-07-01 MED ORDER — MIDAZOLAM HCL 5 MG/5ML IJ SOLN
INTRAMUSCULAR | Status: DC | PRN
  Administered 2020-07-01: 2 mg via INTRAVENOUS

## 2020-07-01 MED ORDER — HYDROMORPHONE HCL 1 MG/ML IJ SOLN: 1.0000 mg | INTRAMUSCULAR | Status: DC | PRN

## 2020-07-01 MED ORDER — DEXAMETHASONE SODIUM PHOSPHATE 10 MG/ML IJ SOLN
INTRAMUSCULAR | Status: DC | PRN
  Administered 2020-07-01: 10 mg via INTRAVENOUS

## 2020-07-01 MED ORDER — FENTANYL CITRATE (PF) 100 MCG/2ML IJ SOLN
INTRAMUSCULAR | Status: AC
  Filled 2020-07-01: qty 2

## 2020-07-01 MED ORDER — SUGAMMADEX SODIUM 500 MG/5ML IV SOLN
INTRAVENOUS | Status: AC
  Filled 2020-07-01: qty 5

## 2020-07-01 MED ORDER — SUGAMMADEX SODIUM 200 MG/2ML IV SOLN
INTRAVENOUS | Status: DC | PRN
  Administered 2020-07-01: 200 mg via INTRAVENOUS

## 2020-07-01 MED ORDER — CIPROFLOXACIN IN D5W 400 MG/200ML IV SOLN
400.0000 mg | INTRAVENOUS | Status: AC
  Administered 2020-07-01: 400 mg via INTRAVENOUS
  Filled 2020-07-01: qty 200

## 2020-07-01 MED ORDER — ACETAMINOPHEN 325 MG PO TABS: 650.0000 mg | ORAL_TABLET | Freq: Four times a day (QID) | ORAL | Status: DC | PRN

## 2020-07-01 MED ORDER — ONDANSETRON HCL 4 MG/2ML IJ SOLN: 4.0000 mg | Freq: Once | INTRAMUSCULAR | Status: DC | PRN

## 2020-07-01 MED ORDER — CHLORHEXIDINE GLUCONATE 0.12 % MT SOLN
15.0000 mL | Freq: Once | OROMUCOSAL | Status: AC
  Administered 2020-07-01: 15 mL via OROMUCOSAL

## 2020-07-01 MED ORDER — MONTELUKAST SODIUM 10 MG PO TABS
10.0000 mg | ORAL_TABLET | Freq: Every day | ORAL | Status: DC
  Administered 2020-07-01: 10 mg via ORAL
  Filled 2020-07-01: qty 1

## 2020-07-01 MED ORDER — OXYBUTYNIN CHLORIDE 5 MG PO TABS
5.0000 mg | ORAL_TABLET | Freq: Two times a day (BID) | ORAL | Status: DC
  Administered 2020-07-01 – 2020-07-02 (×2): 5 mg via ORAL
  Filled 2020-07-01 (×2): qty 1

## 2020-07-01 MED ORDER — DEXAMETHASONE SODIUM PHOSPHATE 10 MG/ML IJ SOLN
INTRAMUSCULAR | Status: AC
  Filled 2020-07-01: qty 1

## 2020-07-01 MED ORDER — HYDROCHLOROTHIAZIDE 25 MG PO TABS
25.0000 mg | ORAL_TABLET | Freq: Every day | ORAL | Status: DC
  Administered 2020-07-02: 25 mg via ORAL
  Filled 2020-07-01: qty 1

## 2020-07-01 MED ORDER — PROPOFOL 10 MG/ML IV BOLUS
INTRAVENOUS | Status: DC | PRN
  Administered 2020-07-01: 150 mg via INTRAVENOUS

## 2020-07-01 MED ORDER — LACTATED RINGERS IV SOLN: INTRAVENOUS | Status: DC

## 2020-07-01 MED ORDER — VENLAFAXINE HCL ER 75 MG PO CP24
225.0000 mg | ORAL_CAPSULE | Freq: Every day | ORAL | Status: DC
  Administered 2020-07-02: 225 mg via ORAL
  Filled 2020-07-01: qty 1

## 2020-07-01 MED ORDER — SCOPOLAMINE 1 MG/3DAYS TD PT72
MEDICATED_PATCH | TRANSDERMAL | Status: DC | PRN
  Administered 2020-07-01: 1 via TRANSDERMAL

## 2020-07-01 MED ORDER — MELOXICAM 15 MG PO TABS
15.0000 mg | ORAL_TABLET | Freq: Every day | ORAL | Status: DC
  Administered 2020-07-02: 15 mg via ORAL
  Filled 2020-07-01: qty 1

## 2020-07-01 MED ORDER — TRAMADOL HCL 50 MG PO TABS
50.0000 mg | ORAL_TABLET | Freq: Four times a day (QID) | ORAL | Status: DC | PRN
  Administered 2020-07-01: 50 mg via ORAL
  Filled 2020-07-01: qty 1

## 2020-07-01 MED ORDER — HYDROMORPHONE HCL 1 MG/ML IJ SOLN
INTRAMUSCULAR | Status: AC
Start: 2020-07-01 — End: 2020-07-02
  Filled 2020-07-01: qty 1

## 2020-07-01 SURGICAL SUPPLY — 28 items
ATTRACTOMAT 16X20 MAGNETIC DRP (DRAPES) ×2 IMPLANT
BLADE SURG 15 STRL LF DISP TIS (BLADE) ×1 IMPLANT
BLADE SURG 15 STRL SS (BLADE) ×2
CHLORAPREP W/TINT 26 (MISCELLANEOUS) ×2 IMPLANT
CLIP VESOCCLUDE MED 6/CT (CLIP) ×6 IMPLANT
CLIP VESOCCLUDE SM WIDE 6/CT (CLIP) ×4 IMPLANT
COVER SURGICAL LIGHT HANDLE (MISCELLANEOUS) ×2 IMPLANT
COVER WAND RF STERILE (DRAPES) ×2 IMPLANT
DERMABOND ADVANCED (GAUZE/BANDAGES/DRESSINGS) ×1
DERMABOND ADVANCED .7 DNX12 (GAUZE/BANDAGES/DRESSINGS) ×1 IMPLANT
DRAPE LAPAROTOMY T 98X78 PEDS (DRAPES) ×2 IMPLANT
ELECT PENCIL ROCKER SW 15FT (MISCELLANEOUS) ×2 IMPLANT
ELECT REM PT RETURN 15FT ADLT (MISCELLANEOUS) ×2 IMPLANT
GAUZE 4X4 16PLY RFD (DISPOSABLE) ×2 IMPLANT
GLOVE SURG ORTHO 8.0 STRL STRW (GLOVE) ×2 IMPLANT
GOWN STRL REUS W/TWL XL LVL3 (GOWN DISPOSABLE) ×4 IMPLANT
HEMOSTAT SURGICEL 2X4 FIBR (HEMOSTASIS) ×2 IMPLANT
ILLUMINATOR WAVEGUIDE N/F (MISCELLANEOUS) ×2 IMPLANT
KIT BASIN OR (CUSTOM PROCEDURE TRAY) ×2 IMPLANT
KIT TURNOVER KIT A (KITS) IMPLANT
PACK BASIC VI WITH GOWN DISP (CUSTOM PROCEDURE TRAY) ×2 IMPLANT
SHEARS HARMONIC 9CM CVD (BLADE) ×2 IMPLANT
SUT MNCRL AB 4-0 PS2 18 (SUTURE) ×2 IMPLANT
SUT VIC AB 3-0 SH 18 (SUTURE) ×4 IMPLANT
SYR BULB IRRIG 60ML STRL (SYRINGE) ×2 IMPLANT
TOWEL OR 17X26 10 PK STRL BLUE (TOWEL DISPOSABLE) ×2 IMPLANT
TOWEL OR NON WOVEN STRL DISP B (DISPOSABLE) ×2 IMPLANT
TUBING CONNECTING 10 (TUBING) ×2 IMPLANT

## 2020-07-01 NOTE — Anesthesia Procedure Notes (Signed)
Procedure Name: Intubation Date/Time: 07/01/2020 10:32 AM Performed by: [place] Ida Uppal C, CRNA Pre-anesthesia Checklist: Patient identified, Emergency Drugs available, Suction available and Patient being monitored Patient Re-evaluated:Patient Re-evaluated prior to induction Oxygen Delivery Method: Circle system utilized Preoxygenation: Pre-oxygenation with 100% oxygen Induction Type: IV induction Ventilation: Mask ventilation without difficulty Laryngoscope Size: Mac and 3 Grade View: Grade I Tube type: Oral Tube size: 7.0 mm Number of attempts: 1 Airway Equipment and Method: Stylet and Oral airway Placement Confirmation: ETT inserted through vocal cords under direct vision,  positive ETCO2 and breath sounds checked- equal and bilateral Secured at: 21 cm Tube secured with: Tape Dental Injury: Teeth and Oropharynx as per pre-operative assessment

## 2020-07-01 NOTE — Interval H&P Note (Signed)
History and Physical Interval Note:  07/01/2020 9:53 AM  Christine Mason  has presented today for surgery, with the diagnosis of THYROID NEOPLASM OF UNCERTAIN BEHAVIOR, MULTIPLE THYROID NODULES.  The various methods of treatment have been discussed with the patient and family. After consideration of risks, benefits and other options for treatment, the patient has consented to    Procedure(s): TOTAL THYROIDECTOMY (N/A) as a surgical intervention.    The patient's history has been reviewed, patient examined, no change in status, stable for surgery.  I have reviewed the patient's chart and labs.  Questions were answered to the patient's satisfaction.    Armandina Gemma, MD [facility] Office: [place]

## 2020-07-01 NOTE — Anesthesia Preprocedure Evaluation (Signed)
Anesthesia Evaluation  Patient identified by MRN, date of birth, ID band Patient awake    Reviewed: Allergy & Precautions, NPO status , Patient's Chart, lab work & pertinent test results  Airway Mallampati: I  TM Distance: >3 FB Neck ROM: Full    Dental   Pulmonary sleep apnea , former smoker,    Pulmonary exam normal        Cardiovascular hypertension, Pt. on medications Normal cardiovascular exam     Neuro/Psych Anxiety Depression    GI/Hepatic GERD  Medicated and Controlled,  Endo/Other    Renal/GU      Musculoskeletal   Abdominal   Peds  Hematology   Anesthesia Other Findings   Reproductive/Obstetrics                             Anesthesia Physical Anesthesia Plan  ASA: III  Anesthesia Plan: General   Post-op Pain Management:    Induction: Intravenous  PONV Risk Score and Plan: 3 and Midazolam, Dexamethasone and Ondansetron  Airway Management Planned: Oral ETT  Additional Equipment:   Intra-op Plan:   Post-operative Plan: Extubation in OR  Informed Consent: I have reviewed the patients History and Physical, chart, labs and discussed the procedure including the risks, benefits and alternatives for the proposed anesthesia with the patient or authorized representative who has indicated his/her understanding and acceptance.       Plan Discussed with: CRNA and Surgeon  Anesthesia Plan Comments:         Anesthesia Quick Evaluation

## 2020-07-01 NOTE — Op Note (Signed)
Procedure Note  Pre-operative Diagnosis:  Thyroid neoplasm of uncertain behavior, multiple thyroid nodules  Post-operative Diagnosis:  same  Surgeon:  Armandina Gemma, MD  Assistant:  Mohammed Kindle, RN   Procedure:  Total thyroidectomy with limited central compartment lymph node dissection  Anesthesia:  General  Estimated Blood Loss:  minimal  Drains: none         Specimen: thyroid to pathology  Indications:  Patient is referred by Dr. Wende Neighbors for surgical evaluation and management of thyroid neoplasm of uncertain behavior and multiple thyroid nodules. Patient was first noted to have abnormal TSH level. This appears to have been markedly suppressed at 0.03. Other levels appear low normal at 0.838. As a result the patient underwent a thyroid ultrasound on February 25, 2020. This demonstrated bilateral thyroid nodules. There was a dominant nodule in the right lobe measuring 3.4 cm and a nodule in the left lobe measuring 1.5 cm, both of which met criteria for biopsy. Fine-needle aspiration biopsy was performed on March 10, 2020. Inadequate sampling was obtained from the right sided nodule. However the left inferior nodule measuring 1.5 cm showed cytologic atypia. Molecular genetic testing yielded a result of suspicious with a 50% risk of malignancy. 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   Patient now comes to surgery for thyroidectomy.  Procedure Details: Procedure was done in OR #4 at the [facility]. The patient was brought to the operating room and placed in a supine position on the operating room table. Following administration of general anesthesia, the patient was positioned and then prepped and draped in the usual aseptic fashion. After ascertaining that an adequate level of anesthesia had been achieved, a small Kocher incision was made with #15 blade. Dissection was carried through subcutaneous tissues  and platysma.Hemostasis was achieved with the electrocautery. Skin flaps were elevated cephalad and caudad from the thyroid notch to the sternal notch. A Mahorner self-retaining retractor was placed for exposure. Strap muscles were incised in the midline and dissection was begun on the left side.  Strap muscles were reflected laterally.  Left thyroid lobe was small and nodular.  The left lobe was gently mobilized with blunt dissection. Superior pole vessels were dissected out and divided individually between small and medium ligaclips with the harmonic scalpel. The thyroid lobe was rolled anteriorly. Branches of the inferior thyroid artery were divided between small ligaclips with the harmonic scalpel. Inferior venous tributaries were divided between ligaclips. Both the superior and inferior parathyroid glands were identified and preserved on their vascular pedicles. The recurrent laryngeal nerve was identified and preserved along its course. The ligament of Gwenlyn Found was released with the electrocautery and the gland was mobilized onto the anterior trachea. Isthmus was mobilized across the midline. There was no significant pyramidal lobe present. Dry pack was placed in the left neck.  The right thyroid lobe was gently mobilized with blunt dissection. Right thyroid lobe was mildly enlarged with a central nodule and a firm mass at the inferior pole. Superior pole vessels were dissected out and divided between small and medium ligaclips with the Harmonic scalpel. Superior parathyroid was identified and preserved. Inferior venous tributaries were divided between medium ligaclips with the harmonic scalpel. The right thyroid lobe was rolled anteriorly and the branches of the inferior thyroid artery divided between small ligaclips. The right recurrent laryngeal nerve was identified and preserved along its course. The ligament of Gwenlyn Found was released with the electrocautery. The right  thyroid lobe was mobilized onto the  anterior trachea and the remainder of the thyroid was dissected off the anterior trachea and the thyroid was completely excised. A suture was used to mark the right lobe. The entire thyroid gland was submitted to pathology for review.  Central compartment lymph nodes were excised with the electrocautery anterior to the trachea down to the innominate artery level inferiorly.  These were submitted separately to pathology for review.  The neck was irrigated with warm saline. Fibrillar was placed throughout the operative field. Strap muscles were approximated in the midline with interrupted 3-0 Vicryl sutures. Platysma was closed with interrupted 3-0 Vicryl sutures. Skin was closed with a running 4-0 Monocryl subcuticular suture. Wound was washed and Dermabond was applied. The patient was awakened from anesthesia and brought to the recovery room. The patient tolerated the procedure well.   Armandina Gemma, MD [facility] Office: [phone]

## 2020-07-01 NOTE — Transfer of Care (Signed)
Immediate Anesthesia Transfer of Care Note  Patient: Christine Mason  Procedure(s) Performed: TOTAL THYROIDECTOMY WITH Central compartment lymph node disection (N/A )  Patient Location: PACU  Anesthesia Type:General  Level of Consciousness: awake, alert  and patient cooperative  Airway & Oxygen Therapy: Patient Spontanous Breathing and Patient connected to face mask oxygen  Post-op Assessment: Report given to RN and Post -op Vital signs reviewed and stable  Post vital signs: Reviewed and stable  Last Vitals:  Vitals Value Taken Time  BP 151/105 07/01/20 1201  Temp    Pulse 81 07/01/20 1203  Resp 18 07/01/20 1203  SpO2 96 % 07/01/20 1203  Vitals shown include unvalidated device data.  Last Pain:  Vitals:   07/01/20 0847  TempSrc:   PainSc: 0-No pain         Complications: No complications documented.

## 2020-07-02 ENCOUNTER — Encounter (HOSPITAL_COMMUNITY): Payer: Self-pay | Admitting: Surgery

## 2020-07-02 DIAGNOSIS — C73 Malignant neoplasm of thyroid gland: Secondary | ICD-10-CM | POA: Diagnosis not present

## 2020-07-02 LAB — BASIC METABOLIC PANEL
Anion gap: 10 (ref 5–15)
BUN: 14 mg/dL (ref 6–20)
CO2: 28 mmol/L (ref 22–32)
Calcium: 9.4 mg/dL (ref 8.9–10.3)
Chloride: 100 mmol/L (ref 98–111)
Creatinine, Ser: 0.64 mg/dL (ref 0.44–1.00)
GFR, Estimated: >60 mL/min (ref >60)
Glucose, Bld: 115 mg/dL — ABNORMAL HIGH (ref 70–99)
Potassium: 4.4 mmol/L (ref 3.5–5.1)
Sodium: 138 mmol/L (ref 135–145)

## 2020-07-02 MED ORDER — OXYCODONE HCL 5 MG PO TABS
5.0000 mg | ORAL_TABLET | Freq: Four times a day (QID) | ORAL | 0 refills | Status: DC | PRN
Start: 2020-07-02 — End: 2021-02-02

## 2020-07-02 MED ORDER — CALCIUM CARBONATE ANTACID 500 MG PO CHEW: 2.0000 | CHEWABLE_TABLET | Freq: Two times a day (BID) | ORAL | 1 refills | Status: DC

## 2020-07-02 MED ORDER — LEVOTHYROXINE SODIUM 100 MCG PO TABS: 100.0000 ug | ORAL_TABLET | Freq: Every day | ORAL | 11 refills | Status: DC

## 2020-07-02 NOTE — Anesthesia Postprocedure Evaluation (Signed)
Anesthesia Post Note  Patient: Christine Mason  Procedure(s) Performed: TOTAL THYROIDECTOMY WITH Central compartment lymph node disection (N/A )     Patient location during evaluation: PACU Anesthesia Type: General Level of consciousness: awake and alert Pain management: pain level controlled Vital Signs Assessment: post-procedure vital signs reviewed and stable Respiratory status: spontaneous breathing, nonlabored ventilation, respiratory function stable and patient connected to nasal cannula oxygen Cardiovascular status: blood pressure returned to baseline and stable Postop Assessment: no apparent nausea or vomiting Anesthetic complications: no   No complications documented.  Last Vitals:  Vitals:   07/02/20 0507 07/02/20 0935  BP: (!) 159/80 (!) 143/91  Pulse: 76 65  Resp: 18 14  Temp: 36.6 C 37 C  SpO2: 95% 93%    Last Pain:  Vitals:   07/02/20 0935  TempSrc: Oral  PainSc:                  Ariyanah Aguado DAVID

## 2020-07-02 NOTE — Progress Notes (Signed)
D/C instructions given to patient. Patient has no questions. NT or writer will wheel patient out once she is dressed and her ride is here

## 2020-07-02 NOTE — Discharge Summary (Signed)
Physician Discharge Summary [facility]  Patient ID: Christine Mason MRN: [id number] DOB/AGE: 1971/08/09 49 y.o.  Admit date: 07/01/2020 Discharge date: 07/02/2020  Admission Diagnoses:  Thyroid neoplasm of uncertain behavior  Discharge Diagnoses:  Principal Problem:   Neoplasm of uncertain behavior of thyroid gland Active Problems:   Multiple thyroid nodules   Discharged Condition: good  Hospital Course: Patient was admitted for observation following thyroid surgery.  Post op course was uncomplicated.  Pain was well controlled.  Tolerated diet.  Post op calcium level on morning following surgery was 9.4 mg/dl.  Patient was prepared for discharge home on POD#1.  Consults: None  Treatments: surgery: total thyroidectomy with limited lymph node dissection  Discharge Exam: Blood pressure (!) 159/80, pulse 76, temperature 97.8 F (36.6 C), temperature source Oral, resp. rate 18, height 5\' 3"  (1.6 m), weight 124.7 kg, last menstrual period 05/22/2015, SpO2 95 %. HEENT - clear Neck - wound dry and intact; Dermabond in place; voice essentially normal Chest - clear bilaterally Cor - RRR  Disposition: Home  Discharge Instructions    Diet - low sodium heart healthy   Complete by: As directed    Discharge instructions   Complete by: As directed    [facility]  THYROID & PARATHYROID SURGERY:  POST-OP INSTRUCTIONS  Always review your discharge instruction sheet from the facility where your surgery was performed.  A prescription for pain medication may be given to you upon discharge.  Take your pain medication as prescribed.  If narcotic pain medicine is not needed, then you may take acetaminophen (Tylenol) or ibuprofen (Advil) as needed.  Take your usually prescribed medications unless otherwise directed.  If you need a refill on your pain medication, please contact our office during regular business hours.  Prescriptions cannot be  processed by our office after 5 pm or on weekends.  Start with a light diet upon arrival home, such as soup and crackers or toast.  Be sure to drink plenty of fluids daily.  Resume your normal diet the day after surgery.  Most patients will experience some swelling and bruising on the chest and neck area.  Ice packs will help.  Swelling and bruising can take several days to resolve.   It is common to experience some constipation after surgery.  Increasing fluid intake and taking a stool softener (Colace) will usually help or prevent this problem.  A mild laxative (Milk of Magnesia or Miralax) should be taken according to package directions if there has been no bowel movement after 48 hours.  You have steri-strips and a gauze dressing over your incision.  You may remove the gauze bandage on the second day after surgery, and you may shower at that time.  Leave your steri-strips (small skin tapes) in place directly over the incision.  These strips should remain on the skin for 5-7 days and then be removed.  You may get them wet in the shower and pat them dry.  You may resume regular (light) daily activities beginning the next day (such as daily self-care, walking, climbing stairs) gradually increasing activities as tolerated.  You may have sexual intercourse when it is comfortable.  Refrain from any heavy lifting or straining until approved by your doctor.  You may drive when you no longer are taking prescription pain medication, you can comfortably wear a seatbelt, and you can safely maneuver your car and apply brakes.  You should see your doctor in the office for  a follow-up appointment approximately three weeks after your surgery.  Make sure that you call for this appointment within a day or two after you arrive home to insure a convenient appointment time.  WHEN TO CALL YOUR DOCTOR: -- Fever greater than 101.5 -- Inability to urinate -- Nausea and/or vomiting - persistent -- Extreme swelling or  bruising -- Continued bleeding from incision -- Increased pain, redness, or drainage from the incision -- Difficulty swallowing or breathing -- Muscle cramping or spasms -- Numbness or tingling in hands or around lips  The clinic staff is available to answer your questions during regular business hours.  Please don't hesitate to call and ask to speak to one of the nurses if you have concerns.  Armandina Gemma, MD [facility] Office: [phone]   Increase activity slowly   Complete by: As directed    No dressing needed   Complete by: As directed      Allergies as of 07/02/2020      Reactions   Penicillins Rash   Has patient had a PCN reaction causing immediate rash, facial/tongue/throat swelling, SOB or lightheadedness with hypotension: {Yes Has patient had a PCN reaction causing severe rash involving mucus membranes or skin necrosis:unknown Has patient had a PCN reaction that required hospitalizationNo Has patient had a PCN reaction occurring within the last 10 years:No If all of the above answers are "NO", then may proceed with Cephalosporin use.      Medication List    TAKE these medications   atenolol 50 MG tablet Commonly known as: Tenormin Take 1 tablet (50 mg total) by mouth daily. What changed: when to take this   calcium carbonate 500 MG chewable tablet Commonly known as: Tums Chew 2 tablets (400 mg of elemental calcium total) by mouth 2 (two) times daily.   EPINEPHrine 0.3 mg/0.3 mL Soaj injection Commonly known as: EPI-PEN Inject 0.3 mg into the muscle as needed for anaphylaxis.   gabapentin 300 MG capsule Commonly known as: NEURONTIN Take 1 capsule (300 mg total) by mouth 3 (three) times daily.   hydrochlorothiazide 25 MG tablet Commonly known as: HYDRODIURIL Take 25 mg by mouth daily.   HYDROcodone-acetaminophen 5-325 MG tablet Commonly known as: NORCO/VICODIN Take 1 tablet by mouth at bedtime.   levocetirizine 5 MG tablet Commonly  known as: XYZAL Take 1 tablet (5 mg total) by mouth every morning. What changed: when to take this   levothyroxine 100 MCG tablet Commonly known as: Synthroid Take 1 tablet (100 mcg total) by mouth daily.   LORazepam 1 MG tablet Commonly known as: ATIVAN Take 1 mg by mouth at bedtime.   losartan 100 MG tablet Commonly known as: COZAAR Take 100 mg by mouth daily.   meloxicam 15 MG tablet Commonly known as: MOBIC Take 15 mg by mouth daily.   methocarbamol 750 MG tablet Commonly known as: Robaxin-750 Take 1 tablet (750 mg total) by mouth at bedtime.   montelukast 10 MG tablet Commonly known as: SINGULAIR Take 1 tablet (10 mg total) by mouth at bedtime.   ondansetron 4 MG tablet Commonly known as: ZOFRAN Take 1 tablet (4 mg total) by mouth every 8 (eight) hours as needed for nausea or vomiting.   oxybutynin 5 MG tablet Commonly known as: DITROPAN Take 5 mg by mouth 2 (two) times daily.   oxyCODONE 5 MG immediate release tablet Commonly known as: Oxy IR/ROXICODONE Take 1-2 tablets (5-10 mg total) by mouth every 6 (six) hours as needed for moderate  pain.   pantoprazole 40 MG tablet Commonly known as: PROTONIX Take 40 mg by mouth 2 (two) times daily.   traMADol 50 MG tablet Commonly known as: ULTRAM Take by mouth every 6 (six) hours as needed.   venlafaxine XR 75 MG 24 hr capsule Commonly known as: EFFEXOR-XR Take 225 mg by mouth daily.   Vitamin D3 50 MCG (2000 UT) Tabs Take 2,000 Units by mouth daily.            Discharge Care Instructions  (From admission, onward)         Start     Ordered   07/02/20 0000  No dressing needed        07/02/20 3149          Follow-up Information    Armandina Gemma, MD. Schedule an appointment as soon as possible for a visit in 3 week(s).   Specialty: General Surgery Contact information: [address] [place] [address] [phone]               Earnstine Regal, MD, [facility] Office: [phone]   Signed: Armandina Gemma 07/02/2020, 8:39 AM

## 2020-07-05 LAB — SURGICAL PATHOLOGY

## 2020-07-12 ENCOUNTER — Ambulatory Visit (INDEPENDENT_AMBULATORY_CARE_PROVIDER_SITE_OTHER): Payer: BLUE CROSS/BLUE SHIELD | Admitting: Endocrinology

## 2020-07-12 ENCOUNTER — Encounter: Payer: Self-pay | Admitting: Endocrinology

## 2020-07-12 ENCOUNTER — Other Ambulatory Visit: Payer: Self-pay

## 2020-07-12 ENCOUNTER — Ambulatory Visit: Payer: BLUE CROSS/BLUE SHIELD | Admitting: Endocrinology

## 2020-07-12 DIAGNOSIS — C73 Malignant neoplasm of thyroid gland: Secondary | ICD-10-CM

## 2020-07-12 NOTE — Patient Instructions (Addendum)
Please consider the treatment options we discussed, and let us know.  Please continue the same synthroid     Radioactive Iodine Treatment for Thyroid Cancer  Radioactive iodine treatment is a treatment for thyroid cancer. The treatment involves swallowing a pill or liquid that contains a substance called radioactive iodine (I-131), or radioiodine. The substance is absorbed by the thyroid gland and destroys cancerous tissue. This treatment may be used to kill cancer cells that remain after surgery to remove the cancer. It may also be done for thyroid cancer that has spread or has come back after treatments. Tell a health care provider about:  Any allergies you have.  All medicines you are taking, including vitamins, herbs, eye drops, creams, and over-the-counter medicines.  Any surgeries you have had.  Any medical conditions you have.  Any blood disorders you have.  Whether you are pregnant, may be pregnant, or have gone through menopause, if this applies.  Whether you are breastfeeding, if this applies.  Any contact you have with children or pregnant women.  Your travel plans for the next 3 months.  Whether you pass through radiation detectors for work or travel. What are the risks? Generally, this is a safe procedure. However, problems may occur, including:  Damage to other structures or organs, such as the salivary glands. This could lead to dry mouth and loss of taste.  Increased risk for leukemia or other cancers.  Lower sperm count or infertility in men.  Irregular menstrual periods in women. What happens before the procedure? Eating and drinking restrictions  Follow instructions from your health care provider about eating or drinking restrictions.  Follow a low-iodine diet as told by your health care provider. Check ingredients on packaged foods and beverages because there are foods that you will need to avoid while on the low-iodine diet: ? Avoid iodized table  salt and foods that have iodized salt. ? Avoid seafood, seaweed, soybeans, and soy products. ? Avoid dairy products and eggs. ? Avoid the food dye Red No. 3 because it has iodine. Tests and exams See your health care provider for any needed tests and exams. Before your treatment:  You will have tests to check your thyroid hormone levels.  You will take a small dose of radioactive iodine and have a body scan. The dose will be much smaller than your normal treatment dose. The body scan will show if your body is absorbing the iodine.  You may get injections of thyroid-stimulating hormone to help your body absorb iodine. General instructions  Ask your health care provider about changing or stopping your regular medicines. You may have to stop taking your thyroid hormone medicine or other medicines.  Plan to drive yourself home after treatment with no one else in the car. Do not take public transportation. If you need someone to drive you home, sit as far away from the driver as possible.  If you are breastfeeding, ask your health care provider when to stop. Radioactive iodine can pass into your milk. What happens during the procedure?  You will have a body scan to check for cancerous cells in your thyroid.  You may be given medicine to prevent nausea or vomiting.  You will go to an isolated room with lead-lined walls. The room will protect others from the radioactive treatment that you will receive.  You will take the radioactive iodine in liquid or pill form. You may have water to swallow the pills.  You will have to stay in the  isolated room with lead-lined walls for at least 2 hours. What happens after the procedure?  You will have a body scan to check if the radioactive iodine is being absorbed by your thyroid.  Do not eat or drink for 1-2 hours after the procedure as told by your health care provider.  Your health care provider will discuss with you whether you need to stay in  the hospital a few days.  Follow instructions from your health care provider about avoiding contact with other people to protect them from exposure to radiation. Summary  Radioactive iodine treatment is a treatment for thyroid cancer.  You will have to stay in the isolated room with lead-lined walls for at least 2 hours after taking the radioactive iodine.  Your health care provider will discuss with you whether you need to stay in the hospital a few days.  Do not eat or drink for 1-2 hours after the procedure as told by your health care provider.  Plan to drive yourself home after treatment with no one else in the car. Do not take public transportation. If you need someone to drive you home, sit as far away from the driver as possible. Follow instructions from your health care provider about avoiding contact with other people after the treatment. This information is not intended to replace advice given to you by your health care provider. Make sure you discuss any questions you have with your health care provider. Document Revised: 03/10/2019 Document Reviewed: 03/10/2019 Elsevier Patient Education  [place].     Radioactive Iodine Treatment for Thyroid Cancer, Care After This sheet gives you information about how to care for yourself after your procedure. Your health care provider may also give you more specific instructions. If you have problems or questions, contact your health care provider. What can I expect after the procedure? After the procedure, your body and all of your body fluids--such as sweat, urine, stool, and saliva--will be radioactive. It is common to have:  Pain and swelling in the glands that make saliva (salivary glands).  Dry mouth and less saliva.  Changes in taste.  Pain and swelling in the mouth and cheeks.  Mild nausea.  Swelling or pain in the neck. Follow these instructions at home: Eating and drinking   Do not eat or drink for 1-2 hours  after the procedure.  Do notshare food or drinks with other people for as long as told by your health care provider.  Do not share utensils--such as silverware, plates, or cups--for as long as told by your health care provider. Use disposable utensils, or clean your utensils separately from those of others.  Chew gum or suck on hard candy if your mouth is dry.  Follow the diet that your health care provider recommends.  Drink enough fluid to keep your urine pale yellow. Lifestyle Avoid close contact with other people for as long as told by your health care provider. The radiation in your body can affect others. Avoiding contact with children and pregnant women is especially important because they are more sensitive to radiation. For as long as told by your health care provider:  Sleep alone. Sleep in a separate bed if you normally share your bed with another person.  Flush twice after using the toilet. Clean up any spills of urine or stool in the bathroom right away. ? Avoid using public bathrooms.  Wash your sheets, towels, and clothes separately from other people's items.  Do not have close intimate  contact of any kind. This includes kissing, physical contact, and sexual intercourse.  Stay home from work or school as told by your health care provider.  Avoid using public transportation or riding in cars with other people. Pregnancy and breastfeeding  Do not try to become pregnant for as long as you are told by your health care provider. This may be for up to 1 year after your procedure. Use a method of birth control (contraception) to prevent pregnancy. Talk to your health care provider about what form of contraception is right for you.  Do not breastfeed for as long as you are told by your health care provider, if this applies. General instructions  Take over-the-counter and prescription medicines only as told by your health care provider.  Keep all follow-up visits as told by  your health care provider. This is important. Contact a health care provider if:  You have severe nausea and vomiting.  You are having problems eating or drinking.  You are constipated or have diarrhea. Get help right away if:  You have trouble breathing.  Your salivary glands are very painful and swollen.  Your saliva smells or tastes bad. Summary  After the procedure, it is common to have a dry mouth, mild nausea, and some pain and swelling in the salivary glands, the neck, or the mouth and cheeks.  Do notshare your utensils, food, or drinks with other people for as long as told by your health care provider.  Avoid being around other people for as long as told by your health care provider. The radiation in your body can affect others. Avoiding contact with children and pregnant women is especially important.  Drink enough fluid to keep your urine pale yellow. This information is not intended to replace advice given to you by your health care provider. Make sure you discuss any questions you have with your health care provider. Document Revised: 03/10/2019 Document Reviewed: 03/10/2019 Elsevier Patient Education  [place].      Thyrotropin Alfa injection What is this medicine? THYROTROPIN ALFA (thahy ruh TROH pin AL fa) is a man-made protein. It is used to diagnose any remaining thyroid cancer after treatment. It is also used to help treat thyroid cancer. This medicine may be used for other purposes; ask your health care provider or pharmacist if you have questions. COMMON BRAND NAME(S): Thyrogen What should I tell my health care provider before I take this medicine? They need to know if you have any of these conditions:  cancer that has spread to other parts of the body  have thyroid tissue remaining  heart disease  kidney disease  smoke tobacco  an unusual or allergic reaction to thyrotropin, thyroid products, other hormones, medicines, foods, or  preservatives  pregnant or trying to get pregnant  breast-feeding How should I use this medicine? This medicine is for injection into a muscle. It is given by a health care professional in a hospital or clinic setting. Talk to your pediatrician regarding the use of this medicine in children. Special care may be needed. Overdosage: If you think you have taken too much of this medicine contact a poison control center or emergency room at once. NOTE: This medicine is only for you. Do not share this medicine with others. What if I miss a dose? It is important not to miss your dose. Call your doctor or health care professional if you are unable to keep an appointment. What may interact with this medicine? Interactions are not  expected. This list may not describe all possible interactions. Give your health care provider a list of all the medicines, herbs, non-prescription drugs, or dietary supplements you use. Also tell them if you smoke, drink alcohol, or use illegal drugs. Some items may interact with your medicine. What should I watch for while using this medicine? Visit your doctor or health care professional regularly. Your condition will be monitored carefully while you are receiving this medicine. You may need blood work done while you are taking this medicine. What side effects may I notice from receiving this medicine? Side effects that you should report to your doctor or health care professional as soon as possible:  allergic reactions like skin rash, itching or hives, swelling of the face, lips, or tongue  breathing problems  chest pain  signs and symptoms of a stroke like changes in vision; confusion; trouble speaking or understanding; severe headaches; sudden numbness or weakness of the face, arm or leg; trouble walking; dizziness; loss of balance or coordination Side effects that usually do not require medical attention (report to your doctor or health care professional if they  continue or are bothersome):  dizziness  flu-like symptoms  headache  nausea, vomiting  weak or tired This list may not describe all possible side effects. Call your doctor for medical advice about side effects. You may report side effects to FDA at 1-800-FDA-1088. Where should I keep my medicine? This drug is given in a hospital or clinic and will not be stored at home. NOTE: This sheet is a summary. It may not cover all possible information. If you have questions about this medicine, talk to your doctor, pharmacist, or health care provider.  2020 Elsevier/Gold Standard (2015-01-29 15:04:47)

## 2020-07-12 NOTE — Progress Notes (Signed)
Subjective:    Patient ID: Christine Mason, female    DOB: April 16, 1971, 49 y.o.   MRN: [id number]  HPI Pt returns for f/u of PTC, with this chronology 6/21 pt presents with hyperthyroidism 6/21 US shows MNG 6/21 Bx Afirma shows high risk of malignancy in LLP nodule 10/21 thyroidect: path shows PTC [id number] pt states she feels well in general.  Neck pain is resolved.   Past Medical History:  Diagnosis Date  . Anxiety   . Carpal tunnel syndrome    bi lat hands  . Chronic leg pain    right  . Chronic pain of left knee   . Depression   . Diverticulitis   . GERD (gastroesophageal reflux disease)   . Heart murmur   . History of kidney stones   . Hypertension   . Hyperthyroidism   . Sleep apnea     no C- Pap    Past Surgical History:  Procedure Laterality Date  . ACNE CYST REMOVAL    . CHOLECYSTECTOMY    . DILATION AND CURETTAGE OF UTERUS  1998  . ESOPHAGOGASTRODUODENOSCOPY (EGD) WITH PROPOFOL N/A 06/13/2019   Procedure: ESOPHAGOGASTRODUODENOSCOPY (EGD) WITH PROPOFOL;  Surgeon: Rogene Houston, MD;  Location: [facility];  Service: Endoscopy;  Laterality: N/A;  7:30  . KNEE SURGERY    . MALONEY DILATION  06/13/2019   Procedure: MALONEY DILATION;  Surgeon: Rogene Houston, MD;  Location: [facility];  Service: Endoscopy;;  . MASS EXCISION    . THORACOTOMY Right 2016  . THYROIDECTOMY N/A 07/01/2020   Procedure: TOTAL THYROIDECTOMY WITH Central compartment lymph node disection;  Surgeon: Armandina Gemma, MD;  Location: [facility];  Service: General;  Laterality: N/A;    Social History   Socioeconomic History  . Marital status: Married    Spouse name: Not on file  . Number of children: 0  . Years of education: Not on file  . Highest education level: Not on file  Occupational History    Employer: [employer]  Tobacco Use  . Smoking status: Former Smoker    Packs/day: 0.50    Years: 5.00    Pack years: 2.50    Types: Cigarettes    Quit date: 09/15/2017    Years  since quitting: 2.8  . Smokeless tobacco: Never Used  Vaping Use  . Vaping Use: Never used  Substance and Sexual Activity  . Alcohol use: No  . Drug use: No  . Sexual activity: Not Currently    Birth control/protection: Post-menopausal  Other Topics Concern  . Not on file  Social History Narrative  . Not on file   Social Determinants of Health   Financial Resource Strain:   . Difficulty of Paying Living Expenses: Not on file  Food Insecurity:   . Worried About Charity fundraiser in the Last Year: Not on file  . Ran Out of Food in the Last Year: Not on file  Transportation Needs:   . Lack of Transportation (Medical): Not on file  . Lack of Transportation (Non-Medical): Not on file  Physical Activity:   . Days of Exercise per Week: Not on file  . Minutes of Exercise per Session: Not on file  Stress:   . Feeling of Stress : Not on file  Social Connections:   . Frequency of Communication with Friends and Family: Not on file  . Frequency of Social Gatherings with Friends and Family: Not on file  . Attends Religious Services: Not  on file  . Active Member of Clubs or Organizations: Not on file  . Attends Club or Organization Meetings: Not on file  . Marital Status: Not on file  Intimate Partner Violence:   . Fear of Current or Ex-Partner: Not on file  . Emotionally Abused: Not on file  . Physically Abused: Not on file  . Sexually Abused: Not on file    Current Outpatient Medications on File Prior to Visit  Medication Sig Dispense Refill  . atenolol (TENORMIN) 50 MG tablet Take 1 tablet (50 mg total) by mouth daily. (Patient taking differently: Take 50 mg by mouth at bedtime. )    . calcium carbonate (TUMS) 500 MG chewable tablet Chew 2 tablets (400 mg of elemental calcium total) by mouth 2 (two) times daily. 90 tablet 1  . Cholecalciferol (VITAMIN D3) 50 MCG (2000 UT) TABS Take 2,000 Units by mouth daily.    . EPINEPHrine 0.3 mg/0.3 mL IJ SOAJ injection Inject 0.3 mg into  the muscle as needed for anaphylaxis.     . gabapentin (NEURONTIN) 300 MG capsule Take 1 capsule (300 mg total) by mouth 3 (three) times daily. 90 capsule 5  . hydrochlorothiazide (HYDRODIURIL) 25 MG tablet Take 25 mg by mouth daily.     . levocetirizine (XYZAL) 5 MG tablet Take 1 tablet (5 mg total) by mouth every morning. (Patient taking differently: Take 5 mg by mouth in the morning and at bedtime. )    . levothyroxine (SYNTHROID) 100 MCG tablet Take 1 tablet (100 mcg total) by mouth daily. 30 tablet 11  . LORazepam (ATIVAN) 1 MG tablet Take 1 mg by mouth at bedtime.     . losartan (COZAAR) 100 MG tablet Take 100 mg by mouth daily.     . meloxicam (MOBIC) 15 MG tablet Take 15 mg by mouth daily.    . methocarbamol (ROBAXIN-750) 750 MG tablet Take 1 tablet (750 mg total) by mouth at bedtime.    . montelukast (SINGULAIR) 10 MG tablet Take 1 tablet (10 mg total) by mouth at bedtime. 30 tablet 5  . ondansetron (ZOFRAN) 4 MG tablet Take 1 tablet (4 mg total) by mouth every 8 (eight) hours as needed for nausea or vomiting. 30 tablet 1  . oxybutynin (DITROPAN) 5 MG tablet Take 5 mg by mouth 2 (two) times daily.    . oxyCODONE (OXY IR/ROXICODONE) 5 MG immediate release tablet Take 1-2 tablets (5-10 mg total) by mouth every 6 (six) hours as needed for moderate pain. 15 tablet 0  . pantoprazole (PROTONIX) 40 MG tablet Take 40 mg by mouth 2 (two) times daily.      . traMADol (ULTRAM) 50 MG tablet Take by mouth every 6 (six) hours as needed.    . venlafaxine XR (EFFEXOR-XR) 75 MG 24 hr capsule Take 225 mg by mouth daily.     No current facility-administered medications on file prior to visit.    Allergies  Allergen Reactions  . Penicillins Rash    Has patient had a PCN reaction causing immediate rash, facial/tongue/throat swelling, SOB or lightheadedness with hypotension: {Yes Has patient had a PCN reaction causing severe rash involving mucus membranes or skin necrosis:unknown Has patient had a PCN  reaction that required hospitalizationNo Has patient had a PCN reaction occurring within the last 10 years:No If all of the above answers are "NO", then may proceed with Cephalosporin use.     Family History  Problem Relation Age of Onset  . Diabetes Mother   .   Hypertension Mother   . Breast cancer Mother 47       dx again at 65; PALB2 and CHEK2 positive  . Diabetes Father   . Hypertension Father   . Coronary artery disease Father   . Hypertension Sister   . Breast cancer Sister        diagnosed in her 30's  . Thyroid cancer Maternal Uncle 46       medullary  . Lung cancer Maternal Grandmother   . Leukemia Maternal Grandfather 67       dx with hairy cell leukemia at 67; CLL at 72  . Lymphoma Maternal Grandfather 82       hodgkins lymphoma  . Brain cancer Paternal Grandmother   . Prostate cancer Paternal Grandfather   . Lung cancer Maternal Aunt 57  . Lymphoma Maternal Uncle 60       follicular lymphoma  . Allergic rhinitis Neg Hx   . Asthma Neg Hx     BP 118/78   Pulse 76   Ht 5' 3" (1.6 m)   Wt 275 lb (124.7 kg)   LMP 05/22/2015   SpO2 96%   BMI 48.71 kg/m   Review of Systems Denies numbness and muscle cramps.      Objective:   Physical Exam VITAL SIGNS:  See vs page GENERAL: no distress Neck: a healing scar is present.  I do not appreciate a nodule in the thyroid or elsewhere in the neck.    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  she chooses RAI with thyrogen.  I have ordered 

## 2020-07-13 ENCOUNTER — Telehealth: Payer: Self-pay | Admitting: Endocrinology

## 2020-07-13 NOTE — Telephone Encounter (Signed)
Patient called stating she would like to do the radioactive iodine treatment if we could go ahead and put the referral / order in for that.

## 2020-07-13 NOTE — Telephone Encounter (Signed)
With the 2 shots, or stopping the levothyroxine?

## 2020-07-14 ENCOUNTER — Ambulatory Visit (INDEPENDENT_AMBULATORY_CARE_PROVIDER_SITE_OTHER): Payer: BLUE CROSS/BLUE SHIELD

## 2020-07-14 DIAGNOSIS — J309 Allergic rhinitis, unspecified: Secondary | ICD-10-CM

## 2020-07-14 DIAGNOSIS — C73 Malignant neoplasm of thyroid gland: Secondary | ICD-10-CM | POA: Insufficient documentation

## 2020-07-14 NOTE — Telephone Encounter (Signed)
I ordered.  you will receive a phone call, about a day and time for an appointment

## 2020-07-14 NOTE — Telephone Encounter (Signed)
Patient would like to continue the medication.

## 2020-07-15 ENCOUNTER — Encounter: Payer: Self-pay | Admitting: Endocrinology

## 2020-07-15 ENCOUNTER — Other Ambulatory Visit: Payer: Self-pay | Admitting: Endocrinology

## 2020-07-15 ENCOUNTER — Telehealth: Payer: Self-pay | Admitting: Endocrinology

## 2020-07-15 ENCOUNTER — Other Ambulatory Visit: Payer: BLUE CROSS/BLUE SHIELD

## 2020-07-15 DIAGNOSIS — C73 Malignant neoplasm of thyroid gland: Secondary | ICD-10-CM

## 2020-07-15 NOTE — Telephone Encounter (Signed)
Patient requests to be called at ph# [phone] re: Status of Orders concerning, RI, Thyrogen injection and thyroid medication information

## 2020-07-15 NOTE — Telephone Encounter (Signed)
Patient called and is requesting a call back about the status of orders concerning, RI, Thyrogen injection and thyroid medication information. States that she spoke with someone about this is earlier.  Is calling to ask if the information can be sent via My Chart to her or her first preference/choice would be to have a call to her mother at [phone] (per PT mother is on [place]).

## 2020-07-16 ENCOUNTER — Ambulatory Visit: Payer: BLUE CROSS/BLUE SHIELD | Admitting: Endocrinology

## 2020-07-16 NOTE — Telephone Encounter (Signed)
Called mother back- explained that the testing that is done, is scheduled through the hospital and they will call them to get scheduled, and go over any instructions.  Mother verbalized understanding.

## 2020-07-16 NOTE — Telephone Encounter (Signed)
Vickki Igou called requesting a call back at [phone] regarding her NM scheduling.

## 2020-07-19 ENCOUNTER — Telehealth: Payer: Self-pay | Admitting: Endocrinology

## 2020-07-19 NOTE — Telephone Encounter (Signed)
Patient called to requests that her husband ph# [phone]  or mother ph# [phone] be called to schedule Radioactive Iodine Treatment and Body Scan if called today (07/19/20) due to patient will be unable to answer her phone today.

## 2020-07-21 ENCOUNTER — Telehealth: Payer: Self-pay | Admitting: Orthopedic Surgery

## 2020-07-21 NOTE — Telephone Encounter (Signed)
Called patient to follow up on authorization/release of information form signed 06/23/20 to review as to what patient needed, left message.

## 2020-07-21 NOTE — Telephone Encounter (Signed)
Patient returned call; states she signed the form in the event she would be able to find out where she had the nerve conduction study done several years ago. Said she is still trying to find out this information; hold authorization for now, and she will let us know.

## 2020-07-26 ENCOUNTER — Emergency Department (HOSPITAL_COMMUNITY): Payer: Commercial Managed Care - PPO

## 2020-07-26 ENCOUNTER — Other Ambulatory Visit: Payer: Self-pay

## 2020-07-26 ENCOUNTER — Emergency Department (HOSPITAL_COMMUNITY)
Admission: EM | Admit: 2020-07-26 | Discharge: 2020-07-26 | Disposition: A | Discharge status: Home / Self Care | Payer: Commercial Managed Care - PPO | Attending: Emergency Medicine | Admitting: Emergency Medicine

## 2020-07-26 ENCOUNTER — Encounter (HOSPITAL_COMMUNITY): Payer: Self-pay

## 2020-07-26 DIAGNOSIS — Z5321 Procedure and treatment not carried out due to patient leaving prior to being seen by health care provider: Secondary | ICD-10-CM | POA: Insufficient documentation

## 2020-07-26 DIAGNOSIS — R079 Chest pain, unspecified: Secondary | ICD-10-CM | POA: Insufficient documentation

## 2020-07-26 DIAGNOSIS — R0602 Shortness of breath: Secondary | ICD-10-CM | POA: Insufficient documentation

## 2020-07-26 DIAGNOSIS — R110 Nausea: Secondary | ICD-10-CM | POA: Insufficient documentation

## 2020-07-26 DIAGNOSIS — M549 Dorsalgia, unspecified: Secondary | ICD-10-CM | POA: Insufficient documentation

## 2020-07-26 LAB — CBC
HCT: 41.9 % (ref 36.0–46.0)
Hemoglobin: 13.3 g/dL (ref 12.0–15.0)
MCH: 30.4 pg (ref 26.0–34.0)
MCHC: 31.7 g/dL (ref 30.0–36.0)
MCV: 95.9 fL (ref 80.0–100.0)
Platelets: 378 10*3/uL (ref 150–400)
RBC: 4.37 MIL/uL (ref 3.87–5.11)
RDW: 14.2 % (ref 11.5–15.5)
WBC: 7.1 10*3/uL (ref 4.0–10.5)
nRBC: 0 % (ref 0.0–0.2)

## 2020-07-26 LAB — BASIC METABOLIC PANEL
Anion gap: 9 (ref 5–15)
BUN: 13 mg/dL (ref 6–20)
CO2: 28 mmol/L (ref 22–32)
Calcium: 9.3 mg/dL (ref 8.9–10.3)
Chloride: 100 mmol/L (ref 98–111)
Creatinine, Ser: 0.7 mg/dL (ref 0.44–1.00)
GFR, Estimated: >60 mL/min (ref >60)
Glucose, Bld: 101 mg/dL — ABNORMAL HIGH (ref 70–99)
Potassium: 3.6 mmol/L (ref 3.5–5.1)
Sodium: 137 mmol/L (ref 135–145)

## 2020-07-26 LAB — TROPONIN I (HIGH SENSITIVITY): Troponin I (High Sensitivity): <2 ng/L (ref <18)

## 2020-07-26 NOTE — ED Notes (Signed)
Pt not in [place] when called  Observed leaving by Registration

## 2020-07-26 NOTE — ED Triage Notes (Addendum)
Pt reports was eating dinner last night and started having pain in left chest.  Reports pain started radiating through to her back this morning.   Reports took protonix and 2 tums and it eased briefly but came back.  Reports had a thyroidectomy on on Sept 14.  Reports nausea and 1 episode of feeling SOB earlier today.

## 2020-07-28 DIAGNOSIS — J3081 Allergic rhinitis due to animal (cat) (dog) hair and dander: Secondary | ICD-10-CM

## 2020-07-28 NOTE — Progress Notes (Signed)
ADDITIONAL LABEL NEEDED 

## 2020-08-06 ENCOUNTER — Ambulatory Visit (INDEPENDENT_AMBULATORY_CARE_PROVIDER_SITE_OTHER): Payer: Commercial Managed Care - PPO

## 2020-08-06 DIAGNOSIS — J309 Allergic rhinitis, unspecified: Secondary | ICD-10-CM | POA: Diagnosis not present

## 2020-08-11 ENCOUNTER — Ambulatory Visit (INDEPENDENT_AMBULATORY_CARE_PROVIDER_SITE_OTHER): Payer: Commercial Managed Care - PPO

## 2020-08-11 DIAGNOSIS — J309 Allergic rhinitis, unspecified: Secondary | ICD-10-CM | POA: Diagnosis not present

## 2020-08-18 ENCOUNTER — Encounter (HOSPITAL_COMMUNITY)
Admission: RE | Admit: 2020-08-18 | Discharge: 2020-08-18 | Disposition: A | Discharge status: Home / Self Care | Payer: Commercial Managed Care - PPO | Source: Ambulatory Visit | Attending: Endocrinology | Admitting: Endocrinology

## 2020-08-18 ENCOUNTER — Other Ambulatory Visit: Payer: Self-pay

## 2020-08-18 DIAGNOSIS — C73 Malignant neoplasm of thyroid gland: Secondary | ICD-10-CM | POA: Insufficient documentation

## 2020-08-18 MED ORDER — THYROTROPIN ALFA 0.9 MG IM SOLR
INTRAMUSCULAR | Status: AC
  Administered 2020-08-18: 0.9 mg via INTRAMUSCULAR
  Filled 2020-08-18: qty 0.9

## 2020-08-18 MED ORDER — THYROTROPIN ALFA 0.9 MG IM SOLR: 0.9000 mg | INTRAMUSCULAR | Status: AC

## 2020-08-19 ENCOUNTER — Encounter (HOSPITAL_COMMUNITY)
Admission: RE | Admit: 2020-08-19 | Discharge: 2020-08-19 | Disposition: A | Discharge status: Home / Self Care | Payer: Commercial Managed Care - PPO | Source: Ambulatory Visit | Attending: Endocrinology | Admitting: Endocrinology

## 2020-08-19 ENCOUNTER — Other Ambulatory Visit: Payer: Self-pay

## 2020-08-19 DIAGNOSIS — C73 Malignant neoplasm of thyroid gland: Secondary | ICD-10-CM | POA: Diagnosis not present

## 2020-08-19 MED ORDER — THYROTROPIN ALFA 0.9 MG IM SOLR
0.9000 mg | INTRAMUSCULAR | Status: AC
  Administered 2020-08-19: 0.9 mg via INTRAMUSCULAR

## 2020-08-19 MED ORDER — THYROTROPIN ALFA 0.9 MG IM SOLR
INTRAMUSCULAR | Status: AC
  Filled 2020-08-19: qty 0.9

## 2020-08-20 ENCOUNTER — Other Ambulatory Visit: Payer: Self-pay

## 2020-08-20 ENCOUNTER — Encounter (HOSPITAL_COMMUNITY)
Admission: RE | Admit: 2020-08-20 | Discharge: 2020-08-20 | Disposition: A | Discharge status: Home / Self Care | Payer: Commercial Managed Care - PPO | Source: Ambulatory Visit | Attending: Endocrinology | Admitting: Endocrinology

## 2020-08-20 MED ORDER — SODIUM IODIDE I 131 CAPSULE: 98.4000 | Freq: Once | INTRAVENOUS | Status: DC | PRN

## 2020-08-25 ENCOUNTER — Ambulatory Visit (INDEPENDENT_AMBULATORY_CARE_PROVIDER_SITE_OTHER): Payer: Commercial Managed Care - PPO

## 2020-08-25 DIAGNOSIS — J309 Allergic rhinitis, unspecified: Secondary | ICD-10-CM

## 2020-08-27 ENCOUNTER — Telehealth: Payer: Self-pay | Admitting: Endocrinology

## 2020-08-27 ENCOUNTER — Other Ambulatory Visit: Payer: Self-pay

## 2020-08-27 ENCOUNTER — Ambulatory Visit (HOSPITAL_COMMUNITY)
Admission: RE | Admit: 2020-08-27 | Discharge: 2020-08-27 | Disposition: A | Discharge status: Home / Self Care | Payer: Commercial Managed Care - PPO | Source: Ambulatory Visit | Attending: Endocrinology | Admitting: Endocrinology

## 2020-08-27 DIAGNOSIS — C73 Malignant neoplasm of thyroid gland: Secondary | ICD-10-CM

## 2020-08-27 NOTE — Telephone Encounter (Signed)
please contact patient: Uptake at the thyroid area is expected, because only 99% of the thyroid is removed.  This is considered a negative scan.  I am happy to give you these results.

## 2020-08-27 NOTE — Telephone Encounter (Signed)
Patient called wanting to go over her body scan results. Please advise [phone]

## 2020-08-30 NOTE — Telephone Encounter (Signed)
Called patient, LVM advising to call back to discuss results.

## 2020-08-31 ENCOUNTER — Telehealth: Payer: Self-pay | Admitting: Endocrinology

## 2020-08-31 NOTE — Telephone Encounter (Signed)
Called patient, gave results.  ?Patient verbalized understanding.  ? ? ?

## 2020-08-31 NOTE — Telephone Encounter (Signed)
Patient returned call. Patient requests to be called at ph# [phone].

## 2020-08-31 NOTE — Telephone Encounter (Signed)
Called patient back, LVM to call back to discuss.

## 2020-09-01 ENCOUNTER — Ambulatory Visit (INDEPENDENT_AMBULATORY_CARE_PROVIDER_SITE_OTHER): Payer: Commercial Managed Care - PPO

## 2020-09-01 DIAGNOSIS — J309 Allergic rhinitis, unspecified: Secondary | ICD-10-CM | POA: Diagnosis not present

## 2020-09-15 ENCOUNTER — Ambulatory Visit (INDEPENDENT_AMBULATORY_CARE_PROVIDER_SITE_OTHER): Payer: Commercial Managed Care - PPO

## 2020-09-15 DIAGNOSIS — J309 Allergic rhinitis, unspecified: Secondary | ICD-10-CM

## 2020-09-24 ENCOUNTER — Ambulatory Visit (INDEPENDENT_AMBULATORY_CARE_PROVIDER_SITE_OTHER): Payer: Commercial Managed Care - PPO

## 2020-09-24 DIAGNOSIS — J309 Allergic rhinitis, unspecified: Secondary | ICD-10-CM

## 2020-09-27 IMAGING — MG DIGITAL SCREENING BILAT W/ TOMO W/ CAD
8 of 15 series · 8 of 40 positions shown · non-contrast
Comparison: Previous exam(s).

ACR Breast Density Category a: The breast tissue is almost entirely
fatty.

CLINICAL DATA: Screening.

EXAM:
DIGITAL SCREENING BILATERAL MAMMOGRAM WITH TOMO AND CAD

[L CC synth-2D]
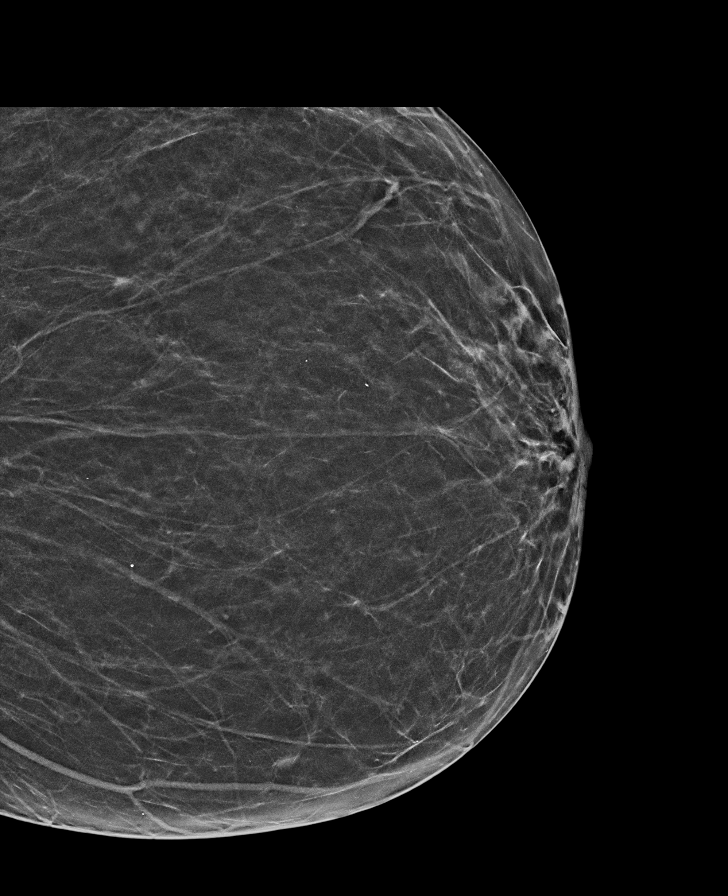

[L MLO synth-2D (1 of 2)]
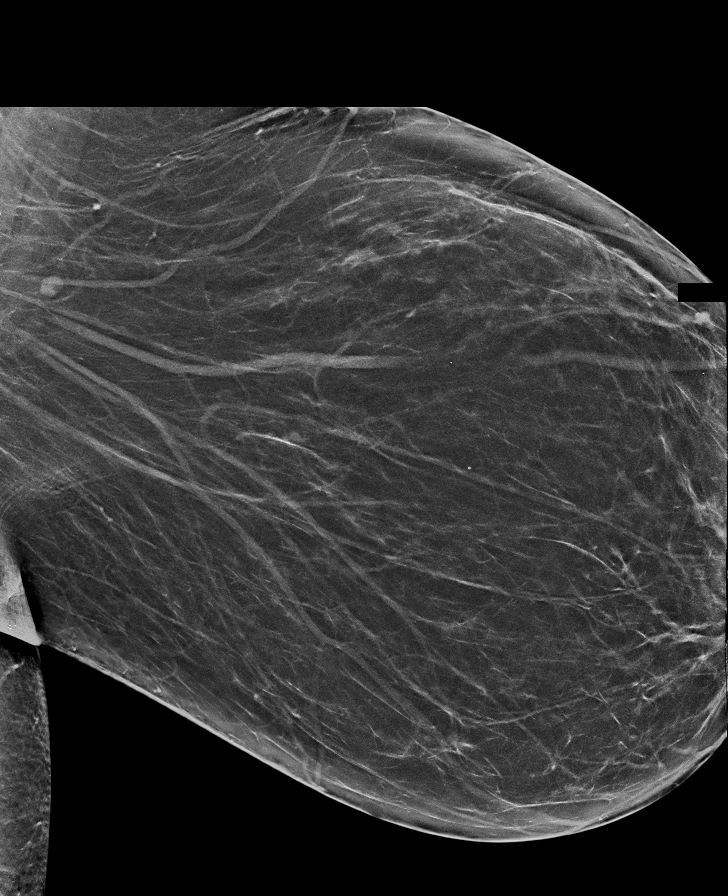

[L MLO synth-2D (2 of 2)]
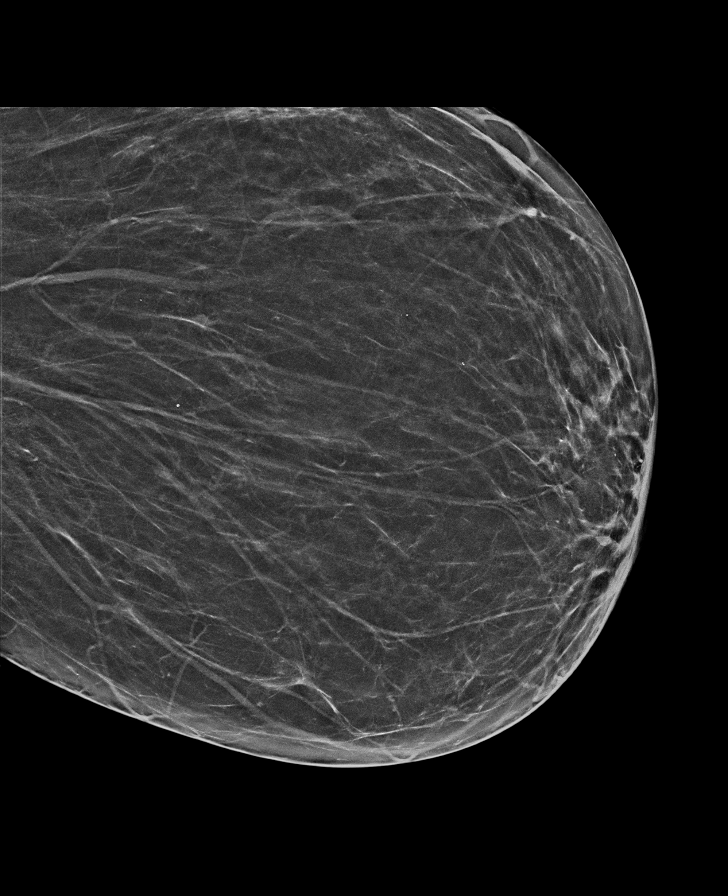

[R CC synth-2D (1 of 2)]
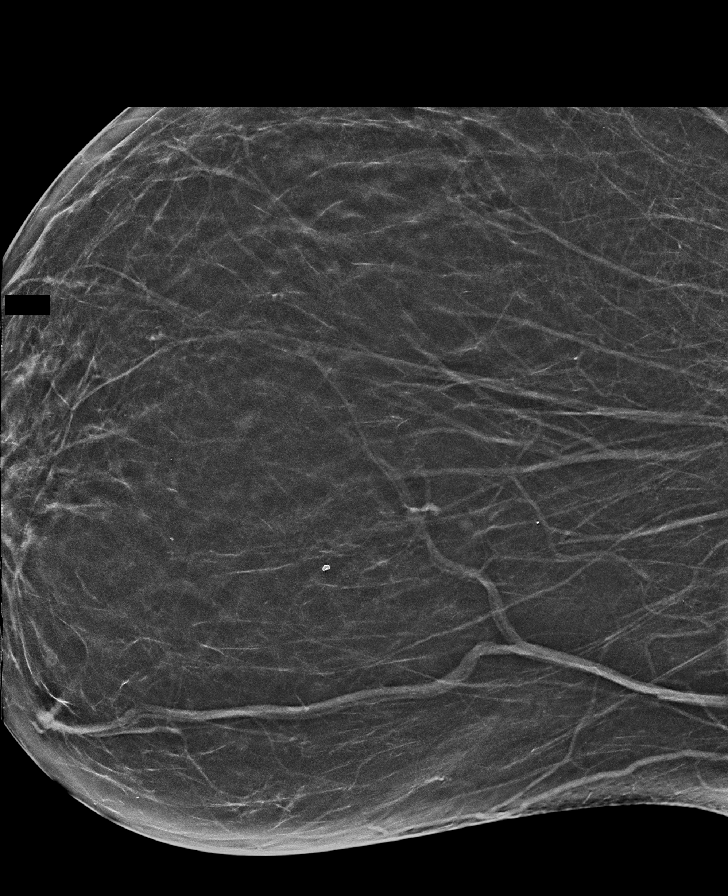

[R MLO synth-2D (1 of 2)]
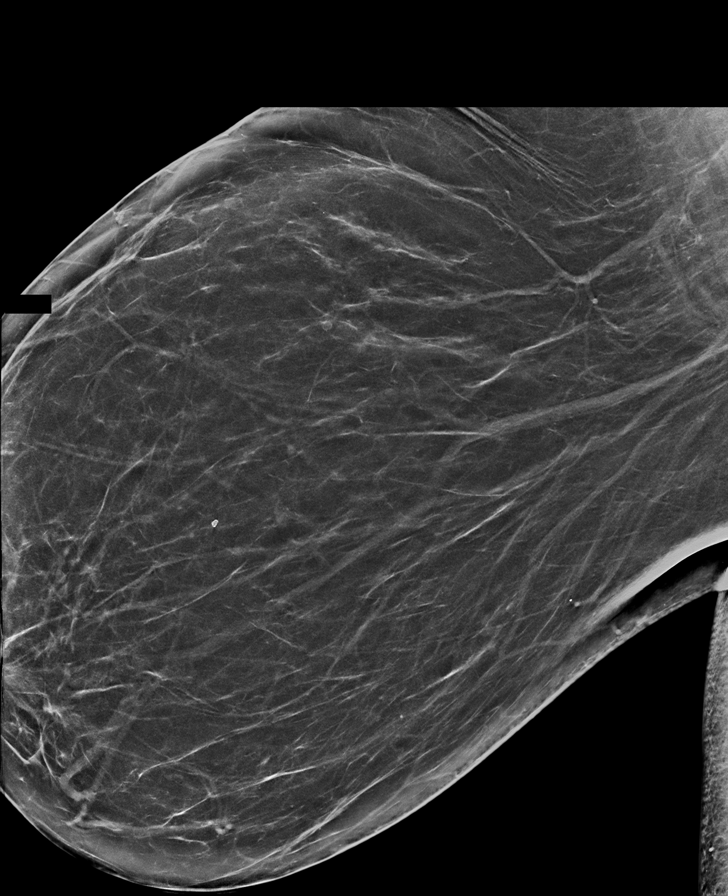

[R CC synth-2D (2 of 2)]
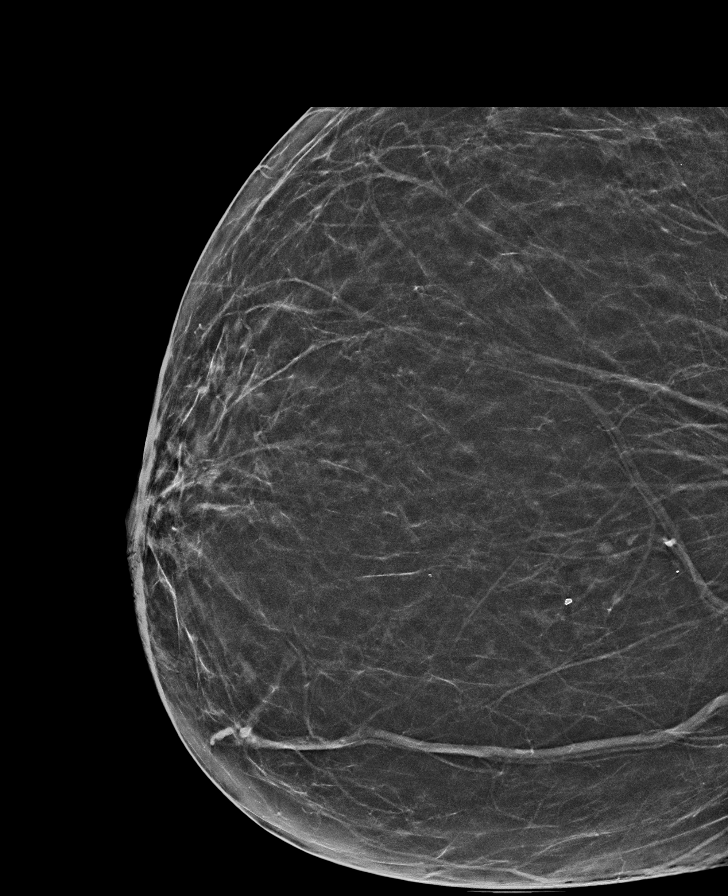

[R MLO synth-2D (2 of 2)]
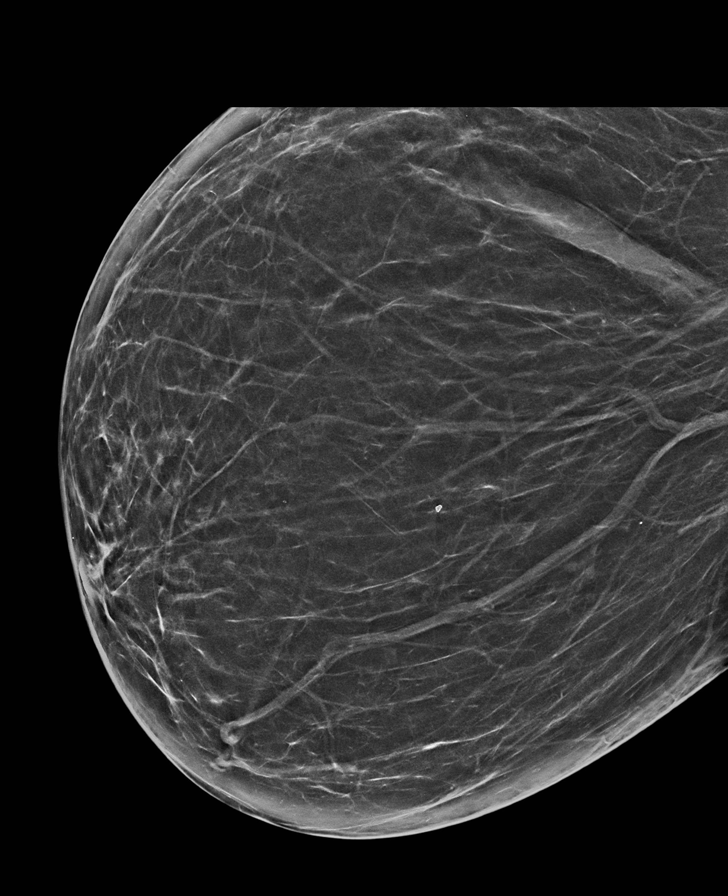

[R MLO tomo · tomo slice 55/80.0]
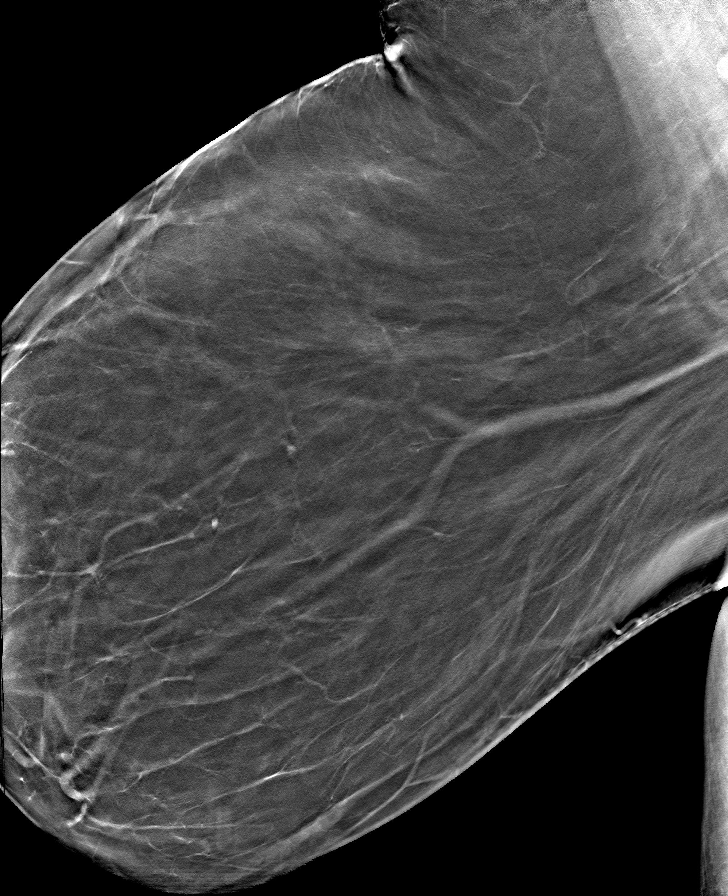

[8 of 40 positions shown; findings below may reference images not displayed]

FINDINGS: In the left breast, a possible mass warrants further evaluation. In
the right breast, no findings suspicious for malignancy.

Images were processed with CAD.
IMPRESSION: Further evaluation is suggested for possible mass in the left
breast.

RECOMMENDATION:
Diagnostic mammogram and possibly ultrasound of the left breast.
(Code:[id number])

The patient will be contacted regarding the findings, and additional
imaging will be scheduled.

BI-RADS CATEGORY  0: Incomplete. Need additional imaging evaluation
and/or prior mammograms for comparison.

## 2020-09-29 ENCOUNTER — Ambulatory Visit (INDEPENDENT_AMBULATORY_CARE_PROVIDER_SITE_OTHER): Payer: Commercial Managed Care - PPO

## 2020-09-29 DIAGNOSIS — J309 Allergic rhinitis, unspecified: Secondary | ICD-10-CM | POA: Diagnosis not present

## 2020-10-07 ENCOUNTER — Telehealth: Payer: Self-pay | Admitting: Endocrinology

## 2020-10-07 ENCOUNTER — Encounter: Payer: Self-pay | Admitting: Endocrinology

## 2020-10-07 NOTE — Telephone Encounter (Signed)
Patient called and wants to know if Dr Loanne Drilling can adjust her medication until she is able to be seen on 11/12/20 - please call [phone] (leave message) or call husband or mother if unable to get patient

## 2020-10-08 NOTE — Telephone Encounter (Signed)
Spoked to pt husband--stated pt requesting to change or adjust thyroid medication due to gaining weight. Please advise

## 2020-10-08 NOTE — Telephone Encounter (Signed)
I do not understand.  Is she asking for a refill?

## 2020-10-08 NOTE — Telephone Encounter (Signed)
Please move up next appt to next available.   

## 2020-10-08 NOTE — Telephone Encounter (Signed)
LVM--to call the office back for an appt.

## 2020-10-08 NOTE — Telephone Encounter (Signed)
Please see below.

## 2020-10-12 ENCOUNTER — Other Ambulatory Visit: Payer: Self-pay

## 2020-10-14 ENCOUNTER — Telehealth (INDEPENDENT_AMBULATORY_CARE_PROVIDER_SITE_OTHER): Payer: Commercial Managed Care - PPO | Admitting: Endocrinology

## 2020-10-14 ENCOUNTER — Other Ambulatory Visit: Payer: Self-pay

## 2020-10-14 ENCOUNTER — Other Ambulatory Visit: Payer: Commercial Managed Care - PPO

## 2020-10-14 DIAGNOSIS — C73 Malignant neoplasm of thyroid gland: Secondary | ICD-10-CM | POA: Diagnosis not present

## 2020-10-14 DIAGNOSIS — Z20822 Contact with and (suspected) exposure to covid-19: Secondary | ICD-10-CM

## 2020-10-14 NOTE — Patient Instructions (Addendum)
Blood tests are requested for you today.  We'll let you know about the results.  Please come back for a follow-up appointment in 2 months.   

## 2020-10-14 NOTE — Progress Notes (Signed)
Subjective:    Patient ID: Christine Mason, female    DOB: 25-Jun-1971, 50 y.o.   MRN: [id number]  HPI  telehealth visit today via video visit.  Alternatives to telehealth are presented to this patient, and the patient agrees to the telehealth visit.   Pt is advised of the cost of the visit, and agrees to this, also.   Patient is at home, and I am at the office.   Persons attending the telehealth visit: the patient and I.  Pt returns for f/u of PTC, with this chronology: 6/21 pt presents with hyperthyroidism.   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  10/21 thyroidect: path shows PTC T1N1Mx 12/21 RAI 98 mCi 12/21 post-therapy scan: Thyroid remnant.   pt states she feels well in general.   Past Medical History:  Diagnosis Date  . Anxiety   . Carpal tunnel syndrome    bi lat hands  . Chronic leg pain    right  . Chronic pain of left knee   . Depression   . Diverticulitis   . GERD (gastroesophageal reflux disease)   . Heart murmur   . History of kidney stones   . Hypertension   . Hyperthyroidism   . Sleep apnea     no C- Pap    Past Surgical History:  Procedure Laterality Date  . ACNE CYST REMOVAL    . CHOLECYSTECTOMY    . DILATION AND CURETTAGE OF UTERUS  1998  . ESOPHAGOGASTRODUODENOSCOPY (EGD) WITH PROPOFOL N/A 06/13/2019   Procedure: ESOPHAGOGASTRODUODENOSCOPY (EGD) WITH PROPOFOL;  Surgeon: Rogene Houston, MD;  Location: [facility];  Service: Endoscopy;  Laterality: N/A;  7:30  . KNEE SURGERY    . MALONEY DILATION  06/13/2019   Procedure: MALONEY DILATION;  Surgeon: Rogene Houston, MD;  Location: [facility];  Service: Endoscopy;;  . MASS EXCISION    . THORACOTOMY Right 2016  . THYROIDECTOMY N/A 07/01/2020   Procedure: TOTAL THYROIDECTOMY WITH Central compartment lymph node disection;  Surgeon: Armandina Gemma, MD;  Location: [facility];  Service: General;  Laterality: N/A;    Social History   Socioeconomic History  .  Marital status: Married    Spouse name: Not on file  . Number of children: 0  . Years of education: Not on file  . Highest education level: Not on file  Occupational History    Employer: [employer]  Tobacco Use  . Smoking status: Former Smoker    Packs/day: 0.50    Years: 5.00    Pack years: 2.50    Types: Cigarettes    Quit date: 09/15/2017    Years since quitting: 3.0  . Smokeless tobacco: Never Used  Vaping Use  . Vaping Use: Never used  Substance and Sexual Activity  . Alcohol use: No  . Drug use: No  . Sexual activity: Not Currently    Birth control/protection: Post-menopausal  Other Topics Concern  . Not on file  Social History Narrative  . Not on file   Social Determinants of Health   Financial Resource Strain: Not on file  Food Insecurity: Not on file  Transportation Needs: Not on file  Physical Activity: Not on file  Stress: Not on file  Social Connections: Not on file  Intimate Partner Violence: Not on file    Current Outpatient Medications on File Prior to Visit  Medication Sig Dispense Refill  . atenolol (TENORMIN) 50 MG tablet Take  1 tablet (50 mg total) by mouth daily. (Patient taking differently: Take 50 mg by mouth at bedtime. )    . calcium carbonate (TUMS) 500 MG chewable tablet Chew 2 tablets (400 mg of elemental calcium total) by mouth 2 (two) times daily. 90 tablet 1  . Cholecalciferol (VITAMIN D3) 50 MCG (2000 UT) TABS Take 2,000 Units by mouth daily.    Marland Kitchen EPINEPHrine 0.3 mg/0.3 mL IJ SOAJ injection Inject 0.3 mg into the muscle as needed for anaphylaxis.     Marland Kitchen gabapentin (NEURONTIN) 300 MG capsule Take 1 capsule (300 mg total) by mouth 3 (three) times daily. 90 capsule 5  . hydrochlorothiazide (HYDRODIURIL) 25 MG tablet Take 25 mg by mouth daily.     Marland Kitchen levocetirizine (XYZAL) 5 MG tablet Take 1 tablet (5 mg total) by mouth every morning. (Patient taking differently: Take 5 mg by mouth in the morning and at bedtime. )    . levothyroxine  (SYNTHROID) 100 MCG tablet Take 1 tablet (100 mcg total) by mouth daily. 30 tablet 11  . LORazepam (ATIVAN) 1 MG tablet Take 1 mg by mouth at bedtime.     Marland Kitchen losartan (COZAAR) 100 MG tablet Take 100 mg by mouth daily.     . meloxicam (MOBIC) 15 MG tablet Take 15 mg by mouth daily.    . methocarbamol (ROBAXIN-750) 750 MG tablet Take 1 tablet (750 mg total) by mouth at bedtime.    . montelukast (SINGULAIR) 10 MG tablet Take 1 tablet (10 mg total) by mouth at bedtime. 30 tablet 5  . ondansetron (ZOFRAN) 4 MG tablet Take 1 tablet (4 mg total) by mouth every 8 (eight) hours as needed for nausea or vomiting. 30 tablet 1  . oxybutynin (DITROPAN) 5 MG tablet Take 5 mg by mouth 2 (two) times daily.    Marland Kitchen oxyCODONE (OXY IR/ROXICODONE) 5 MG immediate release tablet Take 1-2 tablets (5-10 mg total) by mouth every 6 (six) hours as needed for moderate pain. 15 tablet 0  . pantoprazole (PROTONIX) 40 MG tablet Take 40 mg by mouth 2 (two) times daily.      . traMADol (ULTRAM) 50 MG tablet Take by mouth every 6 (six) hours as needed.    . venlafaxine XR (EFFEXOR-XR) 75 MG 24 hr capsule Take 225 mg by mouth daily.     No current facility-administered medications on file prior to visit.    Allergies  Allergen Reactions  . Penicillins Rash    Has patient had a PCN reaction causing immediate rash, facial/tongue/throat swelling, SOB or lightheadedness with hypotension: {Yes Has patient had a PCN reaction causing severe rash involving mucus membranes or skin necrosis:unknown Has patient had a PCN reaction that required hospitalizationNo Has patient had a PCN reaction occurring within the last 10 years:No If all of the above answers are "NO", then may proceed with Cephalosporin use.     Family History  Problem Relation Age of Onset  . Diabetes Mother   . Hypertension Mother   . Breast cancer Mother 58       dx again at 45; PALB2 and CHEK2 positive  . Diabetes Father   . Hypertension Father   . Coronary  artery disease Father   . Hypertension Sister   . Breast cancer Sister        diagnosed in her 51's  . Thyroid cancer Maternal Uncle 46       medullary  . Lung cancer Maternal Grandmother   . Leukemia Maternal Grandfather 87  dx with hairy cell leukemia at 25; CLL at 25  . Lymphoma Maternal Grandfather 82       hodgkins lymphoma  . Brain cancer Paternal Grandmother   . Prostate cancer Paternal Grandfather   . Lung cancer Maternal Aunt 70  . Lymphoma Maternal Uncle 60       follicular lymphoma  . Allergic rhinitis Neg Hx   . Asthma Neg Hx     LMP 05/22/2015    Review of Systems     Objective:   Physical Exam      Assessment & Plan:  PTC: due for recheck Postsurgical hypothyroidism: I requested labs. Until then, Please continue the same synthroid.  Patient Instructions  Blood tests are requested for you today.  We'll let you know about the results.   Please come back for a follow-up appointment in 2 months.

## 2020-10-15 ENCOUNTER — Ambulatory Visit: Payer: Commercial Managed Care - PPO | Admitting: Endocrinology

## 2020-10-15 ENCOUNTER — Ambulatory Visit: Payer: Commercial Managed Care - PPO | Admitting: Allergy & Immunology

## 2020-10-15 LAB — NOVEL CORONAVIRUS, NAA: SARS-CoV-2, NAA: NOT DETECTED

## 2020-10-15 LAB — SARS-COV-2, NAA 2 DAY TAT

## 2020-10-19 NOTE — Patient Instructions (Incomplete)
Itching with perennial and seasonal allergic rhinitis(grass, ragweed, weed pollen, indoor molds, dust mite, cat) Continue allergy injections per protocol and have access to epinephrine autoinjector Continue Singulair 10 mg once a day Continue Xyzal 5 mg 1-2 times per day Continue Nasacort nasal spray 1 spray each nostril once a day as needed for stuffy nose Continue to moisturize skin Continue to avoid lotions with fragrances and dyes  Dysphagia Continue Protonix twice a day  Please let us know if this treatment plan is not working well for you Schedule a follow-up appointment in

## 2020-10-20 ENCOUNTER — Telehealth: Payer: Self-pay | Admitting: Endocrinology

## 2020-10-20 ENCOUNTER — Ambulatory Visit (INDEPENDENT_AMBULATORY_CARE_PROVIDER_SITE_OTHER): Payer: Commercial Managed Care - PPO

## 2020-10-20 ENCOUNTER — Ambulatory Visit: Payer: Commercial Managed Care - PPO | Admitting: Family

## 2020-10-20 DIAGNOSIS — J309 Allergic rhinitis, unspecified: Secondary | ICD-10-CM | POA: Diagnosis not present

## 2020-10-20 NOTE — Telephone Encounter (Signed)
[facility] on [address] called because they don't have an order for pt. They said they think it was sent to their other location so they are asking it be sent to their fax at  [phone]

## 2020-10-22 ENCOUNTER — Other Ambulatory Visit: Payer: Self-pay

## 2020-10-22 DIAGNOSIS — C73 Malignant neoplasm of thyroid gland: Secondary | ICD-10-CM

## 2020-10-22 NOTE — Telephone Encounter (Signed)
Labs are ordered 

## 2020-10-22 NOTE — Telephone Encounter (Signed)
Pt called needing lab order sent to [facility] on [address]. Pt states [facility] has not received it yet. Pt wants to get labs done ASAP  Fax# [phone]

## 2020-10-22 NOTE — Telephone Encounter (Signed)
Please see below.

## 2020-10-22 NOTE — Telephone Encounter (Signed)
Notified pt lab ordered.

## 2020-10-25 ENCOUNTER — Encounter: Payer: Self-pay | Admitting: Endocrinology

## 2020-10-25 ENCOUNTER — Telehealth: Payer: Self-pay | Admitting: Endocrinology

## 2020-10-25 NOTE — Telephone Encounter (Signed)
Pt called with concerns about most recent THS levels. She would like the nurse to give her a call at [phone] regarding this.

## 2020-10-26 ENCOUNTER — Other Ambulatory Visit: Payer: Self-pay | Admitting: Endocrinology

## 2020-10-26 ENCOUNTER — Other Ambulatory Visit: Payer: Self-pay

## 2020-10-26 ENCOUNTER — Telehealth: Payer: Self-pay | Admitting: Endocrinology

## 2020-10-26 MED ORDER — LEVOTHYROXINE SODIUM 150 MCG PO TABS: 150.0000 ug | ORAL_TABLET | Freq: Every day | ORAL | 3 refills | Status: DC

## 2020-10-26 NOTE — Telephone Encounter (Signed)
Notified pt all results not yet available. Thyroglobulin--sent  to [place] --frozen and takes about 1 week.

## 2020-10-26 NOTE — Telephone Encounter (Signed)
Patient's husband Eddie Dibbles ph [phone] returned call

## 2020-10-26 NOTE — Telephone Encounter (Signed)
Already talk to the wife--which is the pt.

## 2020-10-26 NOTE — Telephone Encounter (Signed)
Please review lab results.

## 2020-10-26 NOTE — Telephone Encounter (Signed)
All results are not yet available.  Please ask lab when thyroglobulin will be available.

## 2020-10-26 NOTE — Telephone Encounter (Signed)
Patient called again re: request call to be advised of lab results, however Patient did not hear back from our office. Patient states she is now at work and requests that Patient's husband Eddie Dibbles be called at ph# [phone] to be given Patient's lab results and address Patient's thyroid levels.

## 2020-10-29 ENCOUNTER — Ambulatory Visit: Payer: Commercial Managed Care - PPO | Admitting: Family

## 2020-11-01 LAB — TSH: TSH: 31.6 u[IU]/mL — ABNORMAL HIGH (ref 0.450–4.500)

## 2020-11-01 LAB — THYROGLOBULIN LEVEL: Thyroglobulin (TG-RIA): <2 ng/mL

## 2020-11-01 LAB — T4, FREE: Free T4: 0.99 ng/dL (ref 0.82–1.77)

## 2020-11-01 LAB — THYROGLOBULIN ANTIBODY: Thyroglobulin Antibody: <1 IU/mL (ref 0.0–0.9)

## 2020-11-02 NOTE — Patient Instructions (Addendum)
Itching with perennial and seasonal allergic rhinitis(grass, ragweed, weed pollen, indoor molds, dust mite, cat) Continue allergy injections per protocol and have access to epinephrine autoinjector Continue Singulair 10 mg once a day Continue Xyzal 5 mg 1-2 times per day Stop Nasacort nasal spray Start Flonase Sensimist 1 spray each nostril once a day as needed for stuffy nose. If  Insurance does not cover this you can buy this over the counter Start to moisturize skin Continue to avoid lotions with fragrances and dyes  Dysphagia Continue Protonix twice a day  Dyspnea Start albuterol 2 puffs every 4 hours as needed for cough, wheeze, tightness in chest, or shortness of breath  Schedule an appointment with your primary care physician to discuss heart palpitations  Please let us know if this treatment plan is not working well for you Schedule a follow-up appointment in 3 months

## 2020-11-03 ENCOUNTER — Encounter: Payer: Self-pay | Admitting: Family

## 2020-11-03 ENCOUNTER — Other Ambulatory Visit: Payer: Self-pay

## 2020-11-03 ENCOUNTER — Ambulatory Visit (INDEPENDENT_AMBULATORY_CARE_PROVIDER_SITE_OTHER): Payer: Commercial Managed Care - PPO | Admitting: Family

## 2020-11-03 VITALS — BP 124/80 | HR 72 | Resp 18 | Ht 62.0 in | Wt 295.0 lb

## 2020-11-03 DIAGNOSIS — L299 Pruritus, unspecified: Secondary | ICD-10-CM | POA: Diagnosis not present

## 2020-11-03 DIAGNOSIS — R0600 Dyspnea, unspecified: Secondary | ICD-10-CM

## 2020-11-03 DIAGNOSIS — R0609 Other forms of dyspnea: Secondary | ICD-10-CM

## 2020-11-03 DIAGNOSIS — J302 Other seasonal allergic rhinitis: Secondary | ICD-10-CM | POA: Diagnosis not present

## 2020-11-03 DIAGNOSIS — J309 Allergic rhinitis, unspecified: Secondary | ICD-10-CM

## 2020-11-03 DIAGNOSIS — J3089 Other allergic rhinitis: Secondary | ICD-10-CM | POA: Diagnosis not present

## 2020-11-03 MED ORDER — FLONASE SENSIMIST 27.5 MCG/SPRAY NA SUSP: 1.0000 | Freq: Every day | NASAL | 5 refills | Status: DC

## 2020-11-03 MED ORDER — ALBUTEROL SULFATE HFA 108 (90 BASE) MCG/ACT IN AERS: 2.0000 | INHALATION_SPRAY | RESPIRATORY_TRACT | 1 refills | Status: DC | PRN

## 2020-11-03 NOTE — Progress Notes (Signed)
[address], [address]  [place] [address] Dept: [phone]  FOLLOW UP NOTE  Patient ID: Christine Mason, female    DOB: [birth year]  Age: 50 y.o. MRN: [id number] Date of Office Visit: 11/03/2020  Assessment  Chief Complaint: Allergic Rhinitis  (Still itching. She says she still has her cats. The itching is somewhat better, seems to be the worse at night. Allergy injections are going well. )  HPI Christine Mason is a 50 year old female presents today for follow-up of itching with perennial and seasonal allergic rhinitis and dysphagia.  She was last seen on April 16, 2020 by Dr.  Ernst Bowler.  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 Armandina Gemma.  Itching with perennial and seasonal allergic rhinitis is reported as moderately controlled with allergy injections every 2 weeks, Singulair 10 mg once a day, Xyzal 5 mg twice a day, and Nasacort nasal spray as needed.  She reports that she does not like to use a nasal spray due to the drainage that goes down her throat afterwards.  She reports nasal congestion and denies rhinorrhea and postnasal drip.  She reports that her allergy injections are helping and denies any large local reactions.  She does report some minor burning afterwards.  She continues to have itching with no rash.  She does report that the itching is worse at night, but has been a bit better.  She has 2 cats and does not want to get rid of them.  She has not tried to moisturize her skin to see if this helps as was recommended at her last office visit.  Dysphagia is reported as moderately controlled with Protonix twice a day.  She reports that she will occasionally take Tums for heartburn.  She reports on October 10 she went to the emergency room for dyspnea that was worse than usual.  She reports dyspnea with exertion only.  She was given Decadron while in the emergency room and she  not tell the difference in her dyspnea.  She was also given a prescription for prednisone to take at home and she did not pick this up.  While she was in the emergency room she had a chest x-ray that showed:" No acute abnormality noted."  She also had a CT angiography chest with contrast that showed:" 1 pulmonary arterial opacification is moderate.  No pulmonary emboli are seen.  The study excludes large central emboli.  Small peripheral emboli might be inapparent given the degree of opacity.  There is no positive findings to suggest the presence however.  2 no active pulmonary disease.  Mild scarring at the lung apices.  3 aortic atherosclerosis, minimal."   Drug Allergies:  Allergies  Allergen Reactions  . Penicillins Rash    Has patient had a PCN reaction causing immediate rash, facial/tongue/throat swelling, SOB or lightheadedness with hypotension: {Yes Has patient had a PCN reaction causing severe rash involving mucus membranes or skin necrosis:unknown Has patient had a PCN reaction that required hospitalizationNo Has patient had a PCN reaction occurring within the last 10 years:No If all of the above answers are "NO", then may proceed with Cephalosporin use.     Review of Systems: Review of Systems  Constitutional: Negative for chills and fever.  HENT:       Reports nasal congestion and denies postnasal drip and rhinorrhea  Eyes:       Reports occasional itchy  watery eyes  Respiratory: Positive for shortness of breath. Negative for cough and wheezing.   Cardiovascular: Positive for palpitations. Negative for chest pain.       Reports palpitations do not occur that often.  Gastrointestinal: Positive for heartburn.       Ports very rare heartburn for which Tums helps  Genitourinary: Negative for dysuria.  Skin: Positive for itching. Negative for rash.  Neurological: Negative for headaches.  Endo/Heme/Allergies: Positive for environmental allergies.   Physical Exam: BP 124/80 (BP  Location: Right Arm, Patient Position: Sitting, Cuff Size: Large)   Pulse 72   Resp 18   Ht 5\' 2"  (1.575 m)   Wt 295 lb (133.8 kg)   LMP 05/22/2015   SpO2 96%   BMI 53.96 kg/m    Physical Exam Constitutional:      Appearance: Normal appearance.  HENT:     Head: Normocephalic and atraumatic.     Right Ear: Tympanic membrane, ear canal and external ear normal.     Left Ear: Tympanic membrane, ear canal and external ear normal.     Mouth/Throat:     Mouth: Mucous membranes are moist.     Pharynx: Oropharynx is clear.  Eyes:     Conjunctiva/sclera: Conjunctivae normal.  Cardiovascular:     Rate and Rhythm: Regular rhythm.     Heart sounds: Normal heart sounds.  Pulmonary:     Effort: Pulmonary effort is normal.     Breath sounds: Normal breath sounds.     Comments: Lungs clear to auscultation Musculoskeletal:     Cervical back: Neck supple.  Skin:    General: Skin is warm.     Comments: No rashes or urticarial lesions noted  Neurological:     Mental Status: She is alert and oriented to person, place, and time.  Psychiatric:        Mood and Affect: Mood normal.        Behavior: Behavior normal.        Thought Content: Thought content normal.        Judgment: Judgment normal.     Diagnostics: FVC 2.29 L, FEV1 2.06 L.  Predicted FVC 3.28 L, FEV1 2.60 L.  Spirometry indicates mild restriction.  Status post bronchodilator response shows FVC 2.11 L, FEV1 1.92 L.  Spirometry indicates moderate restriction with no significant bronchodilator response.  Assessment and Plan: 1. Seasonal and perennial allergic rhinitis   2. Itching   3. Dyspnea on exertion     Meds ordered this encounter  Medications  . fluticasone (FLONASE SENSIMIST) 27.5 MCG/SPRAY nasal spray    Sig: Place 1 spray into the nose daily.    Dispense:  10 g    Refill:  5  . albuterol (VENTOLIN HFA) 108 (90 Base) MCG/ACT inhaler    Sig: Inhale 2 puffs into the lungs every 4 (four) hours as needed for wheezing  or shortness of breath.    Dispense:  8 g    Refill:  1    Patient Instructions  Itching with perennial and seasonal allergic rhinitis(grass, ragweed, weed pollen, indoor molds, dust mite, cat) Continue allergy injections per protocol and have access to epinephrine autoinjector Continue Singulair 10 mg once a day Continue Xyzal 5 mg 1-2 times per day Stop Nasacort nasal spray Start Flonase Sensimist 1 spray each nostril once a day as needed for stuffy nose. If  Insurance does not cover this you can buy this over the counter Start to moisturize skin Continue to avoid lotions  with fragrances and dyes  Dysphagia Continue Protonix twice a day  Dyspnea Start albuterol 2 puffs every 4 hours as needed for cough, wheeze, tightness in chest, or shortness of breath  Schedule an appointment with your primary care physician to discuss heart palpitations  Please let us know if this treatment plan is not working well for you Schedule a follow-up appointment in 3 months   Return in about 3 months (around 01/31/2021), or if symptoms worsen or fail to improve.    Thank you for the opportunity to care for this patient.  Please do not hesitate to contact me with questions.  Althea Charon, FNP [facility]

## 2020-11-03 NOTE — Addendum Note (Signed)
Addended by: Jaynie Crumble on: 11/03/2020 04:08 PM   Modules accepted: Orders

## 2020-11-12 ENCOUNTER — Ambulatory Visit: Payer: Commercial Managed Care - PPO | Admitting: Endocrinology

## 2020-11-17 ENCOUNTER — Ambulatory Visit (INDEPENDENT_AMBULATORY_CARE_PROVIDER_SITE_OTHER): Payer: Commercial Managed Care - PPO

## 2020-11-17 DIAGNOSIS — J309 Allergic rhinitis, unspecified: Secondary | ICD-10-CM

## 2020-11-23 DIAGNOSIS — J3089 Other allergic rhinitis: Secondary | ICD-10-CM | POA: Diagnosis not present

## 2020-11-23 NOTE — Progress Notes (Signed)
VIALS EXP 11-23-21

## 2020-12-03 ENCOUNTER — Ambulatory Visit (INDEPENDENT_AMBULATORY_CARE_PROVIDER_SITE_OTHER): Payer: Commercial Managed Care - PPO

## 2020-12-03 DIAGNOSIS — J309 Allergic rhinitis, unspecified: Secondary | ICD-10-CM | POA: Diagnosis not present

## 2020-12-21 ENCOUNTER — Emergency Department (HOSPITAL_COMMUNITY)
Admission: EM | Admit: 2020-12-21 | Discharge: 2020-12-21 | Disposition: A | Discharge status: Home / Self Care | Payer: Commercial Managed Care - PPO | Attending: Emergency Medicine | Admitting: Emergency Medicine

## 2020-12-21 ENCOUNTER — Other Ambulatory Visit: Payer: Self-pay

## 2020-12-21 ENCOUNTER — Encounter (HOSPITAL_COMMUNITY): Payer: Self-pay | Admitting: Emergency Medicine

## 2020-12-21 DIAGNOSIS — I10 Essential (primary) hypertension: Secondary | ICD-10-CM | POA: Insufficient documentation

## 2020-12-21 DIAGNOSIS — R197 Diarrhea, unspecified: Secondary | ICD-10-CM | POA: Insufficient documentation

## 2020-12-21 DIAGNOSIS — Z87891 Personal history of nicotine dependence: Secondary | ICD-10-CM | POA: Insufficient documentation

## 2020-12-21 DIAGNOSIS — E876 Hypokalemia: Secondary | ICD-10-CM | POA: Insufficient documentation

## 2020-12-21 DIAGNOSIS — Z79899 Other long term (current) drug therapy: Secondary | ICD-10-CM | POA: Insufficient documentation

## 2020-12-21 LAB — GASTROINTESTINAL PANEL BY PCR, STOOL (REPLACES STOOL CULTURE)

## 2020-12-21 LAB — COMPREHENSIVE METABOLIC PANEL
ALT: 173 U/L — ABNORMAL HIGH (ref 0–44)
AST: 61 U/L — ABNORMAL HIGH (ref 15–41)
Albumin: 4 g/dL (ref 3.5–5.0)
Alkaline Phosphatase: 92 U/L (ref 38–126)
Anion gap: 9 (ref 5–15)
BUN: 12 mg/dL (ref 6–20)
CO2: 28 mmol/L (ref 22–32)
Calcium: 8.9 mg/dL (ref 8.9–10.3)
Chloride: 103 mmol/L (ref 98–111)
Creatinine, Ser: 0.77 mg/dL (ref 0.44–1.00)
GFR, Estimated: >60 mL/min (ref >60)
Glucose, Bld: 114 mg/dL — ABNORMAL HIGH (ref 70–99)
Potassium: 2.8 mmol/L — ABNORMAL LOW (ref 3.5–5.1)
Sodium: 140 mmol/L (ref 135–145)
Total Bilirubin: 0.6 mg/dL (ref 0.3–1.2)
Total Protein: 7.3 g/dL (ref 6.5–8.1)

## 2020-12-21 LAB — CBC WITH DIFFERENTIAL/PLATELET
Abs Immature Granulocytes: 0.03 10*3/uL (ref 0.00–0.07)
Basophils Absolute: 0 10*3/uL (ref 0.0–0.1)
Basophils Relative: 0 %
Eosinophils Absolute: 0.1 10*3/uL (ref 0.0–0.5)
Eosinophils Relative: 1 %
HCT: 43.3 % (ref 36.0–46.0)
Hemoglobin: 14.4 g/dL (ref 12.0–15.0)
Immature Granulocytes: 0 %
Lymphocytes Relative: 19 %
Lymphs Abs: 1.4 10*3/uL (ref 0.7–4.0)
MCH: 31.6 pg (ref 26.0–34.0)
MCHC: 33.3 g/dL (ref 30.0–36.0)
MCV: 95.2 fL (ref 80.0–100.0)
Monocytes Absolute: 0.7 10*3/uL (ref 0.1–1.0)
Monocytes Relative: 9 %
Neutro Abs: 5.5 10*3/uL (ref 1.7–7.7)
Neutrophils Relative %: 71 %
Platelets: 305 10*3/uL (ref 150–400)
RBC: 4.55 MIL/uL (ref 3.87–5.11)
RDW: 13 % (ref 11.5–15.5)
WBC: 7.7 10*3/uL (ref 4.0–10.5)
nRBC: 0 % (ref 0.0–0.2)

## 2020-12-21 LAB — C DIFFICILE QUICK SCREEN W PCR REFLEX
C Diff antigen: NEGATIVE
C Diff interpretation: NOT DETECTED
C Diff toxin: NEGATIVE

## 2020-12-21 LAB — URINALYSIS, ROUTINE W REFLEX MICROSCOPIC
Bacteria, UA: NONE SEEN
Bilirubin Urine: NEGATIVE
Glucose, UA: NEGATIVE mg/dL
Ketones, ur: NEGATIVE mg/dL
Nitrite: NEGATIVE
Protein, ur: NEGATIVE mg/dL
Specific Gravity, Urine: 1.016 (ref 1.005–1.030)
pH: 6 (ref 5.0–8.0)

## 2020-12-21 MED ORDER — ONDANSETRON HCL 4 MG/2ML IJ SOLN
4.0000 mg | Freq: Once | INTRAMUSCULAR | Status: AC
  Administered 2020-12-21: 4 mg via INTRAVENOUS
  Filled 2020-12-21: qty 2

## 2020-12-21 MED ORDER — POTASSIUM CHLORIDE ER 10 MEQ PO TBCR: 10.0000 meq | EXTENDED_RELEASE_TABLET | Freq: Every day | ORAL | 0 refills | Status: DC

## 2020-12-21 MED ORDER — POTASSIUM CHLORIDE 10 MEQ/100ML IV SOLN
10.0000 meq | Freq: Once | INTRAVENOUS | Status: AC
  Administered 2020-12-21: 10 meq via INTRAVENOUS
  Filled 2020-12-21: qty 100

## 2020-12-21 MED ORDER — POTASSIUM CHLORIDE CRYS ER 20 MEQ PO TBCR
40.0000 meq | EXTENDED_RELEASE_TABLET | Freq: Once | ORAL | Status: AC
  Administered 2020-12-21: 40 meq via ORAL
  Filled 2020-12-21: qty 2

## 2020-12-21 MED ORDER — ONDANSETRON 4 MG PO TBDP: 4.0000 mg | ORAL_TABLET | Freq: Three times a day (TID) | ORAL | 0 refills | Status: DC | PRN

## 2020-12-21 NOTE — ED Provider Notes (Signed)
[facility] EMERGENCY DEPARTMENT Provider Note   CSN: [id number] Arrival date & time: 12/21/20  0534     History Chief Complaint  Patient presents with  . Diarrhea    Christine Mason is a 50 y.o. female.  HPI     This a 50 year old female with a history of obesity, diverticulitis, kidney stones, hypertension who presents with diarrhea.  Patient reports that she has had "explosive" diarrhea since Thursday.  She reports some associated nausea but no vomiting.  She has had an occasional abdominal cramping but no significant pain.  She states that she has tried Imodium and Pepto-Bismol with no relief.  No known sick contacts and no one else in the house is sick.  She denies any blood in her stool.  She not had any recent hospitalizations, antibiotic use, travel.  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.  Denies fever greater than 100.4.  No chest pain, shortness of breath.  Past Medical History:  Diagnosis Date  . Anxiety   . Carpal tunnel syndrome    bi lat hands  . Chronic leg pain    right  . Chronic pain of left knee   . Depression   . Diverticulitis   . GERD (gastroesophageal reflux disease)   . Heart murmur   . History of kidney stones   . Hypertension   . Hyperthyroidism   . Sleep apnea     no C- Pap    Patient Active Problem List   Diagnosis Date Noted  . 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 Multiple thyroid nodules 06/29/2020  . Oropharyngeal dysphagia 05/06/2019  . Dysphagia, pharyngoesophageal phase 05/05/2019  . Vasomotor symptoms due to menopause 03/13/2019  . Genetic testing 06/03/2015  . 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 ACNE CYST REMOVAL    . CHOLECYSTECTOMY    . DILATION AND CURETTAGE OF UTERUS  1998  . ESOPHAGOGASTRODUODENOSCOPY (EGD) WITH PROPOFOL N/A 06/13/2019   Procedure:  ESOPHAGOGASTRODUODENOSCOPY (EGD) WITH PROPOFOL;  Surgeon: Rogene Houston, MD;  [facility];  Service: Endoscopy;  Laterality: N/A;  7:30  . KNEE SURGERY    . MALONEY DILATION  06/13/2019   Procedure: MALONEY DILATION;  Surgeon: Rogene Houston, MD;  Location: [facility];  Service: Endoscopy;;  . MASS EXCISION    . THORACOTOMY Right 2016  . THYROIDECTOMY N/A 07/01/2020   Procedure: TOTAL THYROIDECTOMY WITH Central compartment lymph node disection;  Surgeon: Armandina Gemma, MD;  Location: [facility];  Service: General;  Laterality: N/A;     OB History    Gravida  1   Para      Term      Preterm      AB  1   Living  0     SAB  1   IAB      Ectopic      Multiple      Live Births              Family History  Problem Relation Age of Onset  . Diabetes Mother   . Hypertension Mother   . Breast cancer Mother 70       dx again at 21; PALB2 and CHEK2 positive  . Diabetes Father   . Hypertension Father   . Coronary artery disease Father   . Hypertension Sister   .  Breast cancer Sister        diagnosed in her 58's  . Thyroid cancer Maternal Uncle 46       medullary  . Lung cancer Maternal Grandmother   . Leukemia Maternal Grandfather 18       dx with hairy cell leukemia at 23; CLL at 38  . Lymphoma Maternal Grandfather 82       hodgkins lymphoma  . Brain cancer Paternal Grandmother   . Prostate cancer Paternal Grandfather   . Lung cancer Maternal Aunt 23  . Lymphoma Maternal Uncle 60       follicular lymphoma  . Allergic rhinitis Neg Hx   . Asthma Neg Hx     Social History   Tobacco Use  . Smoking status: Former Smoker    Packs/day: 0.50    Years: 5.00    Pack years: 2.50    Types: Cigarettes    Quit date: 09/15/2017    Years since quitting: 3.2  . Smokeless tobacco: Never Used  Vaping Use  . Vaping Use: Never used  Substance Use Topics  . Alcohol use: No  . Drug use: No    Home Medications Prior to Admission medications    Medication Sig Start Date End Date Taking? Authorizing Provider  albuterol (VENTOLIN HFA) 108 (90 Base) MCG/ACT inhaler Inhale 2 puffs into the lungs every 4 (four) hours as needed for wheezing or shortness of breath. 11/03/20  Yes Althea Charon, FNP  atenolol (TENORMIN) 50 MG tablet Take 1 tablet (50 mg total) by mouth daily. Patient taking differently: Take 50 mg by mouth at bedtime. 04/19/18  Yes Nicholas Lose, MD  bismuth subsalicylate (PEPTO BISMOL) 262 MG chewable tablet Chew 524 mg by mouth as needed for diarrhea or loose stools.   Yes [provider]  Cholecalciferol (VITAMIN D3) 50 MCG (2000 UT) TABS Take 2,000 Units by mouth daily.   Yes [provider]  EPINEPHrine 0.3 mg/0.3 mL IJ SOAJ injection Inject 0.3 mg into the muscle as needed for anaphylaxis.  01/15/19  Yes [provider]  fluticasone (FLONASE SENSIMIST) 27.5 MCG/SPRAY nasal spray Place 1 spray into the nose daily. Patient taking differently: Place 1 spray into the nose daily as needed for allergies. 11/03/20  Yes Althea Charon, FNP  gabapentin (NEURONTIN) 300 MG capsule Take 1 capsule (300 mg total) by mouth 3 (three) times daily. Patient taking differently: Take 300 mg by mouth at bedtime. 06/23/20  Yes Carole Civil, MD  hydrochlorothiazide (HYDRODIURIL) 25 MG tablet Take 25 mg by mouth daily.  12/09/18  Yes [provider]  HYDROcodone-acetaminophen (NORCO/VICODIN) 5-325 MG tablet Take 1 tablet by mouth every 6 (six) hours as needed for moderate pain. 12/11/20  Yes [provider]  levocetirizine (XYZAL) 5 MG tablet Take 1 tablet (5 mg total) by mouth every morning. Patient taking differently: Take 5 mg by mouth in the morning and at bedtime. 04/18/19  Yes Nicholas Lose, MD  levothyroxine (SYNTHROID) 150 MCG tablet Take 1 tablet (150 mcg total) by mouth daily. 10/26/20  Yes Renato Shin, MD  loperamide (IMODIUM A-D) 2 MG tablet Take 2-4 mg by mouth 4 (four) times daily as needed  for diarrhea or loose stools.   Yes [provider]  LORazepam (ATIVAN) 1 MG tablet Take 1 mg by mouth at bedtime.  04/02/20  Yes [provider]  losartan (COZAAR) 100 MG tablet Take 100 mg by mouth daily.  12/09/18  Yes [provider]  meloxicam (MOBIC) 15  MG tablet Take 15 mg by mouth daily. 04/27/20  Yes [provider]  methocarbamol (ROBAXIN-750) 750 MG tablet Take 1 tablet (750 mg total) by mouth at bedtime. 04/20/17  Yes Nicholas Lose, MD  montelukast (SINGULAIR) 10 MG tablet Take 1 tablet (10 mg total) by mouth at bedtime. 10/17/19  Yes Valentina Shaggy, MD  Multiple Vitamin (MULTIVITAMIN) tablet Take 2 tablets by mouth daily. gummy   Yes [provider]  ondansetron (ZOFRAN) 4 MG tablet Take 1 tablet (4 mg total) by mouth every 8 (eight) hours as needed for nausea or vomiting. 04/26/17  Yes Nicholas Lose, MD  oxybutynin (DITROPAN) 5 MG tablet Take 5 mg by mouth 2 (two) times daily. 04/27/20  Yes [provider]  oxyCODONE (OXY IR/ROXICODONE) 5 MG immediate release tablet Take 1-2 tablets (5-10 mg total) by mouth every 6 (six) hours as needed for moderate pain. 07/02/20  Yes Armandina Gemma, MD  pantoprazole (PROTONIX) 40 MG tablet Take 40 mg by mouth 2 (two) times daily.   Yes [provider]  rosuvastatin (CRESTOR) 10 MG tablet Take 10 mg by mouth daily. 11/09/20  Yes [provider]  venlafaxine XR (EFFEXOR-XR) 75 MG 24 hr capsule Take 225 mg by mouth daily.   Yes [provider]    Allergies    Penicillins  Review of Systems   Review of Systems  Constitutional: Negative for fever.  Respiratory: Negative for shortness of breath.   Cardiovascular: Negative for chest pain.  Gastrointestinal: Positive for diarrhea and nausea. Negative for abdominal pain and vomiting.  Genitourinary: Negative for dysuria.  All other systems reviewed and are negative.   Physical Exam Updated Vital Signs BP 140/87   Pulse  73   Temp 98.4 F (36.9 C)   Resp 18   LMP 05/22/2015   SpO2 94%   Physical Exam Vitals and nursing note reviewed.  Constitutional:      Appearance: She is well-developed. She is obese. She is not ill-appearing.  HENT:     Head: Normocephalic and atraumatic.     Nose: Nose normal.     Mouth/Throat:     Mouth: Mucous membranes are moist.  Eyes:     Pupils: Pupils are equal, round, and reactive to light.  Cardiovascular:     Rate and Rhythm: Normal rate and regular rhythm.     Heart sounds: Normal heart sounds.  Pulmonary:     Effort: Pulmonary effort is normal. No respiratory distress.     Breath sounds: No wheezing.  Abdominal:     General: Bowel sounds are normal.     Palpations: Abdomen is soft.     Tenderness: There is no abdominal tenderness. There is no guarding or rebound.  Musculoskeletal:     Cervical back: Neck supple.  Skin:    General: Skin is warm and dry.  Neurological:     Mental Status: She is alert and oriented to person, place, and time.  Psychiatric:        Mood and Affect: Mood normal.     ED Results / Procedures / Treatments   Labs (all labs ordered are listed, but only abnormal results are displayed) Labs Reviewed  COMPREHENSIVE METABOLIC PANEL - Abnormal; Notable for the following components:      Result Value   Potassium 2.8 (*)    Glucose, Bld 114 (*)    AST 61 (*)    ALT 173 (*)    All other components within normal limits  URINALYSIS,  ROUTINE W REFLEX MICROSCOPIC - Abnormal; Notable for the following components:   APPearance HAZY (*)    Hgb urine dipstick SMALL (*)    Leukocytes,Ua SMALL (*)    All other components within normal limits  C DIFFICILE QUICK SCREEN W PCR REFLEX  GASTROINTESTINAL PANEL BY PCR, STOOL (REPLACES STOOL CULTURE)  CBC WITH DIFFERENTIAL/PLATELET    EKG None  Radiology No results found.  Procedures Procedures   Medications Ordered in ED Medications  potassium chloride 10 mEq in 100 mL IVPB (has no  administration in time range)  potassium chloride SA (KLOR-CON) CR tablet 40 mEq (has no administration in time range)    ED Course  I have reviewed the triage vital signs and the nursing notes.  Pertinent labs & imaging results that were available during my care of the patient were reviewed by me and considered in my medical decision making (see chart for details).    MDM Rules/Calculators/A&P                          Patient presents with diarrhea.  She is overall nontoxic-appearing and vital signs are reassuring.  She reports ongoing diarrhea since Thursday.  Only risk factor for C. 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  No recent antibiotic use.  She has very minimal abdominal cramping but no significant tenderness on exam.  Colitis or gastroenteritis is also consideration.  Will obtain stool studies and basic lab work.  No significant leukocytosis.  She has potassium of 2.8 and slightly elevated LFTs with an AST 61 ALT 173.  Again no reproducible abdominal tenderness.  Potassium was replaced both orally and IV.  Patient signed out awaiting stool studies.  Final Clinical Impression(s) / ED Diagnoses Final diagnoses:  Diarrhea, unspecified type  Hypokalemia    Rx / DC Orders ED Discharge Orders    None       Horton, Courtney F, MD 12/21/20 0738  

## 2020-12-21 NOTE — ED Triage Notes (Signed)
Patient with diarrhea since 3/31.  She states that every time that she eats or drinks that it goes through her.  She has been trying Imodium and Pepto with no relief.  She did call her MD, who gave her an appt this morning at [facility].  Patient does have some mild abdominal pain, no vomiting, mild nausea.

## 2020-12-21 NOTE — ED Provider Notes (Signed)
Higher to discharge, she is well-appearing ambulatory but continuing to have watery diarrhea.  Vitals normal, potassium and Zofran called into pharmacy, patient agreeable, stool studies returned negative for C. difficile, other stool studies pending at discharge, she will follow up with my chart and her physician.   Noemi Chapel, MD 12/21/20 [phone]

## 2020-12-21 NOTE — Discharge Instructions (Signed)
Continue Imodium -   Take 2 capsules in the morning - one additional capsule with every watery / loose stool - maximum 8 capsules per day -   Your tests show low potassium, You do not have C dif  See your doctor in office this week if no improvement.  Drink lots of fluids  ER for worsening symptoms.

## 2020-12-24 ENCOUNTER — Ambulatory Visit (INDEPENDENT_AMBULATORY_CARE_PROVIDER_SITE_OTHER): Payer: Commercial Managed Care - PPO

## 2020-12-24 DIAGNOSIS — J309 Allergic rhinitis, unspecified: Secondary | ICD-10-CM | POA: Diagnosis not present

## 2021-01-04 ENCOUNTER — Telehealth: Payer: Self-pay | Admitting: Endocrinology

## 2021-01-04 NOTE — Telephone Encounter (Signed)
Patient is having insomnia and PCP recommended calling this office to have thyroid levels checked.  Patient would like her labs to be sent [facility] on [address] in [place].  Any questions [phone]

## 2021-01-05 ENCOUNTER — Ambulatory Visit (INDEPENDENT_AMBULATORY_CARE_PROVIDER_SITE_OTHER): Payer: Commercial Managed Care - PPO | Admitting: *Deleted

## 2021-01-05 DIAGNOSIS — J309 Allergic rhinitis, unspecified: Secondary | ICD-10-CM | POA: Diagnosis not present

## 2021-01-05 NOTE — Telephone Encounter (Signed)
F/u is due (VV is OK).  Let's address then.

## 2021-01-05 NOTE — Telephone Encounter (Signed)
Called pt to book appt, Dr.Ellison's only available date until June was on the 26th and pt has to work that day so she will not be available. She is worried about not getting in and says she cant wait to check her thyroid or talk about this till June and wants Dr to know she has next Wednesday and Friday off. Pt wants to make sure the lab orders are in for her as well.

## 2021-01-06 NOTE — Telephone Encounter (Signed)
Pt is scheduled for a VV on Wed 01/12/2021

## 2021-01-06 NOTE — Telephone Encounter (Signed)
Yes, Dr. Cordelia Pen schedule is 100% + next Wednesday and Friday.

## 2021-01-12 ENCOUNTER — Ambulatory Visit (INDEPENDENT_AMBULATORY_CARE_PROVIDER_SITE_OTHER): Payer: Commercial Managed Care - PPO

## 2021-01-12 ENCOUNTER — Telehealth (INDEPENDENT_AMBULATORY_CARE_PROVIDER_SITE_OTHER): Payer: Commercial Managed Care - PPO | Admitting: Endocrinology

## 2021-01-12 ENCOUNTER — Telehealth: Payer: Self-pay | Admitting: Endocrinology

## 2021-01-12 ENCOUNTER — Other Ambulatory Visit: Payer: Self-pay

## 2021-01-12 VITALS — Ht 62.0 in | Wt 295.0 lb

## 2021-01-12 DIAGNOSIS — E890 Postprocedural hypothyroidism: Secondary | ICD-10-CM

## 2021-01-12 DIAGNOSIS — E876 Hypokalemia: Secondary | ICD-10-CM

## 2021-01-12 DIAGNOSIS — C73 Malignant neoplasm of thyroid gland: Secondary | ICD-10-CM

## 2021-01-12 DIAGNOSIS — J309 Allergic rhinitis, unspecified: Secondary | ICD-10-CM | POA: Diagnosis not present

## 2021-01-12 DIAGNOSIS — E039 Hypothyroidism, unspecified: Secondary | ICD-10-CM | POA: Insufficient documentation

## 2021-01-12 NOTE — Patient Instructions (Signed)
Blood tests are requested for you today.  We'll let you know about the results.  Please come back for a follow-up appointment in 3 months.   

## 2021-01-12 NOTE — Telephone Encounter (Signed)
Pt calling into office states that she had an app this morning with Dr.Ellison at 01/12/2021 and needs the lab work sent over to [facility] in [place], .  [address], [place], [place] [address]

## 2021-01-12 NOTE — Telephone Encounter (Signed)
Called and advised pt labs have been released and pt can be seen at preferred lab. Pt verbalized understanding. Lab requisition number: [id number]

## 2021-01-12 NOTE — Progress Notes (Signed)
Subjective:    Patient ID: Christine Mason, female    DOB: 01-22-1971, 50 y.o.   MRN: [id number]  HPI telehealth visit today via telephone x 12 minutes.  Alternatives to telehealth are presented to this patient, and the patient agrees to the telehealth visit.  Pt is advised of the cost of the visit, and agrees to this, also.   Patient is at home, and I am at the office.   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  10/21 thyroidect: path shows PTC T1N1Mx 12/21 RAI 98 mCi.   12/21 post-therapy scan: Thyroid remnant.   2/22 TG undetectable (Ab neg) She takes synthroid as rx'ed.  She reports weight gain, myalgias, and fatigue.  She was recently noted to have hypokalemia in ER.  Past Medical History:  Diagnosis Date  . Anxiety   . Carpal tunnel syndrome    bi lat hands  . Chronic leg pain    right  . Chronic pain of left knee   . Depression   . Diverticulitis   . GERD (gastroesophageal reflux disease)   . Heart murmur   . History of kidney stones   . Hypertension   . Hyperthyroidism   . Sleep apnea     no C- Pap    Past Surgical History:  Procedure Laterality Date  . ACNE CYST REMOVAL    . CHOLECYSTECTOMY    . DILATION AND CURETTAGE OF UTERUS  1998  . ESOPHAGOGASTRODUODENOSCOPY (EGD) WITH PROPOFOL N/A 06/13/2019   Procedure: ESOPHAGOGASTRODUODENOSCOPY (EGD) WITH PROPOFOL;  Surgeon: Rogene Houston, MD;  [facility];  Service: Endoscopy;  Laterality: N/A;  7:30  . KNEE SURGERY    . MALONEY DILATION  06/13/2019   Procedure: MALONEY DILATION;  Surgeon: Rogene Houston, MD;  [facility];  Service: Endoscopy;;  . MASS EXCISION    . THORACOTOMY Right 2016  . THYROIDECTOMY N/A 07/01/2020   Procedure: TOTAL THYROIDECTOMY WITH Central compartment lymph node  disection;  Surgeon: Armandina Gemma, MD;  Location[facility];  Service: General;  Laterality: N/A;    Social History   Socioeconomic History  . Marital status: Married    Spouse name: Not on file  . Number of children: 0  . Years of education: Not on file  . Highest education level: Not on file  Occupational History    Employer: [employer]  Tobacco Use  . Smoking status: Former Smoker    Packs/day: 0.50    Years: 5.00    Pack years: 2.50    Types: Cigarettes    Quit date: 09/15/2017    Years since quitting: 3.3  . Smokeless tobacco: Never Used  Vaping Use  . Vaping Use: Never used  Substance and Sexual Activity  . Alcohol use: No  . Drug use: No  . Sexual activity: Not Currently    Birth control/protection: Post-menopausal  Other Topics Concern  . Not on file  Social History Narrative  . Not on file   Social Determinants of Health   Financial Resource Strain: Not on file  Food Insecurity: Not on file  Transportation Needs: Not on file  Physical Activity: Not on file  Stress: Not on file  Social Connections: Not on file  Intimate Partner Violence: Not on file  Current Outpatient Medications on File Prior to Visit  Medication Sig Dispense Refill  . atenolol (TENORMIN) 50 MG tablet Take 1 tablet (50 mg total) by mouth daily. (Patient taking differently: Take 50 mg by mouth at bedtime.)    . Cholecalciferol (VITAMIN D3) 50 MCG (2000 UT) TABS Take 2,000 Units by mouth daily.    Marland Kitchen EPINEPHrine 0.3 mg/0.3 mL IJ SOAJ injection Inject 0.3 mg into the muscle as needed for anaphylaxis.     Marland Kitchen gabapentin (NEURONTIN) 300 MG capsule Take 1 capsule (300 mg total) by mouth 3 (three) times daily. (Patient taking differently: Take 300 mg by mouth at bedtime.) 90 capsule 5  . hydrochlorothiazide (HYDRODIURIL) 25 MG tablet Take 25 mg by mouth daily.     Marland Kitchen HYDROcodone-acetaminophen (NORCO/VICODIN) 5-325 MG tablet Take 1 tablet by mouth every 6 (six) hours as needed for moderate  pain.    Marland Kitchen levocetirizine (XYZAL) 5 MG tablet Take 1 tablet (5 mg total) by mouth every morning. (Patient taking differently: Take 5 mg by mouth in the morning and at bedtime.)    . levothyroxine (SYNTHROID) 150 MCG tablet Take 1 tablet (150 mcg total) by mouth daily. 90 tablet 3  . LORazepam (ATIVAN) 1 MG tablet Take 1 mg by mouth at bedtime.     Marland Kitchen losartan (COZAAR) 100 MG tablet Take 100 mg by mouth daily.     . meloxicam (MOBIC) 15 MG tablet Take 15 mg by mouth daily.    . methocarbamol (ROBAXIN-750) 750 MG tablet Take 1 tablet (750 mg total) by mouth at bedtime.    . montelukast (SINGULAIR) 10 MG tablet Take 1 tablet (10 mg total) by mouth at bedtime. 30 tablet 5  . Multiple Vitamin (MULTIVITAMIN) tablet Take 2 tablets by mouth daily. gummy    . ondansetron (ZOFRAN ODT) 4 MG disintegrating tablet Take 1 tablet (4 mg total) by mouth every 8 (eight) hours as needed for nausea or vomiting. 10 tablet 0  . oxybutynin (DITROPAN) 5 MG tablet Take 5 mg by mouth 2 (two) times daily.    . pantoprazole (PROTONIX) 40 MG tablet Take 40 mg by mouth 2 (two) times daily.    . rosuvastatin (CRESTOR) 10 MG tablet Take 10 mg by mouth daily.    Marland Kitchen venlafaxine XR (EFFEXOR-XR) 75 MG 24 hr capsule Take 225 mg by mouth daily.    Marland Kitchen albuterol (VENTOLIN HFA) 108 (90 Base) MCG/ACT inhaler Inhale 2 puffs into the lungs every 4 (four) hours as needed for wheezing or shortness of breath. 8 g 1  . bismuth subsalicylate (PEPTO BISMOL) 262 MG chewable tablet Chew 524 mg by mouth as needed for diarrhea or loose stools.    . fluticasone (FLONASE SENSIMIST) 27.5 MCG/SPRAY nasal spray Place 1 spray into the nose daily. (Patient taking differently: Place 1 spray into the nose daily as needed for allergies.) 10 g 5  . loperamide (IMODIUM A-D) 2 MG tablet Take 2-4 mg by mouth 4 (four) times daily as needed for diarrhea or loose stools.    . ondansetron (ZOFRAN) 4 MG tablet Take 1 tablet (4 mg total) by mouth every 8 (eight) hours as  needed for nausea or vomiting. 30 tablet 1  . oxyCODONE (OXY IR/ROXICODONE) 5 MG immediate release tablet Take 1-2 tablets (5-10 mg total) by mouth every 6 (six) hours as needed for moderate pain. 15 tablet 0  . potassium chloride (KLOR-CON) 10 MEQ tablet Take 1 tablet (10 mEq total) by mouth daily for 7 days. 7  tablet 0   No current facility-administered medications on file prior to visit.    Allergies  Allergen Reactions  . Penicillins Rash    Has patient had a PCN reaction causing immediate rash, facial/tongue/throat swelling, SOB or lightheadedness with hypotension: {Yes Has patient had a PCN reaction causing severe rash involving mucus membranes or skin necrosis:unknown Has patient had a PCN reaction that required hospitalizationNo Has patient had a PCN reaction occurring within the last 10 years:No If all of the above answers are "NO", then may proceed with Cephalosporin use.     Family History  Problem Relation Age of Onset  . Diabetes Mother   . Hypertension Mother   . Breast cancer Mother 20       dx again at 15; PALB2 and CHEK2 positive  . Diabetes Father   . Hypertension Father   . Coronary artery disease Father   . Hypertension Sister   . Breast cancer Sister        diagnosed in her 76's  . Thyroid cancer Maternal Uncle 46       medullary  . Lung cancer Maternal Grandmother   . Leukemia Maternal Grandfather 50       dx with hairy cell leukemia at 24; CLL at 67  . Lymphoma Maternal Grandfather 82       hodgkins lymphoma  . Brain cancer Paternal Grandmother   . Prostate cancer Paternal Grandfather   . Lung cancer Maternal Aunt 57  . Lymphoma Maternal Uncle 60       follicular lymphoma  . Allergic rhinitis Neg Hx   . Asthma Neg Hx     Ht 5' 2"  (1.575 m)   Wt 295 lb (133.8 kg)   LMP 05/22/2015   BMI 53.96 kg/m    Review of Systems     Objective:   Physical Exam       Assessment & Plan:  Hypothyroidism, due for recheck PTC: due for recheck.    Patient Instructions  Blood tests are requested for you today.  We'll let you know about the results.  Please come back for a follow-up appointment in 3 months.

## 2021-01-14 ENCOUNTER — Encounter: Payer: Self-pay | Admitting: Endocrinology

## 2021-01-14 ENCOUNTER — Telehealth: Payer: Self-pay | Admitting: Endocrinology

## 2021-01-14 NOTE — Telephone Encounter (Signed)
We are waiting for TG result.  i'll let you know

## 2021-01-14 NOTE — Telephone Encounter (Signed)
Patient called to find out what recommendations Dr Loanne Drilling has for her after her most recent labs.  Patient requesting a call back regarding medication or wants to make an virtual visit appointment with Dr Loanne Drilling.  Call back # [phone]

## 2021-01-15 ENCOUNTER — Encounter: Payer: Self-pay | Admitting: Endocrinology

## 2021-01-15 MED ORDER — LEVOTHYROXINE SODIUM 175 MCG PO TABS: 175.0000 ug | ORAL_TABLET | Freq: Every day | ORAL | 3 refills | Status: DC

## 2021-01-15 NOTE — Telephone Encounter (Signed)
Message sent thru MyChart 

## 2021-01-18 ENCOUNTER — Encounter: Payer: Self-pay | Admitting: Endocrinology

## 2021-01-18 NOTE — Telephone Encounter (Signed)
Pt requests a call from a nurse regarding these results once they are released at [phone]

## 2021-01-19 LAB — BASIC METABOLIC PANEL
BUN/Creatinine Ratio: 19 (ref 9–23)
BUN: 13 mg/dL (ref 6–24)
CO2: 25 mmol/L (ref 20–29)
Calcium: 9.9 mg/dL (ref 8.7–10.2)
Chloride: 99 mmol/L (ref 96–106)
Creatinine, Ser: 0.7 mg/dL (ref 0.57–1.00)
Glucose: 83 mg/dL (ref 65–99)
Potassium: 4.1 mmol/L (ref 3.5–5.2)
Sodium: 142 mmol/L (ref 134–144)
eGFR: 105 mL/min/{1.73_m2} (ref >59)

## 2021-01-19 LAB — T4, FREE: Free T4: 1.31 ng/dL (ref 0.82–1.77)

## 2021-01-19 LAB — TSH: TSH: 7.32 u[IU]/mL — ABNORMAL HIGH (ref 0.450–4.500)

## 2021-01-19 LAB — THYROGLOBULIN LEVEL: Thyroglobulin (TG-RIA): <2 ng/mL

## 2021-01-19 LAB — THYROGLOBULIN ANTIBODY: Thyroglobulin Antibody: <1 IU/mL (ref 0.0–0.9)

## 2021-01-20 ENCOUNTER — Other Ambulatory Visit: Payer: Self-pay

## 2021-01-21 ENCOUNTER — Ambulatory Visit (INDEPENDENT_AMBULATORY_CARE_PROVIDER_SITE_OTHER): Payer: Commercial Managed Care - PPO

## 2021-01-21 DIAGNOSIS — J309 Allergic rhinitis, unspecified: Secondary | ICD-10-CM

## 2021-01-26 ENCOUNTER — Ambulatory Visit (INDEPENDENT_AMBULATORY_CARE_PROVIDER_SITE_OTHER): Payer: Commercial Managed Care - PPO

## 2021-01-26 DIAGNOSIS — J309 Allergic rhinitis, unspecified: Secondary | ICD-10-CM

## 2021-02-01 DIAGNOSIS — E559 Vitamin D deficiency, unspecified: Secondary | ICD-10-CM | POA: Insufficient documentation

## 2021-02-01 DIAGNOSIS — I10 Essential (primary) hypertension: Secondary | ICD-10-CM | POA: Insufficient documentation

## 2021-02-01 DIAGNOSIS — R7301 Impaired fasting glucose: Secondary | ICD-10-CM | POA: Insufficient documentation

## 2021-02-01 DIAGNOSIS — E782 Mixed hyperlipidemia: Secondary | ICD-10-CM | POA: Insufficient documentation

## 2021-02-02 ENCOUNTER — Ambulatory Visit (INDEPENDENT_AMBULATORY_CARE_PROVIDER_SITE_OTHER): Payer: Commercial Managed Care - PPO | Admitting: Allergy & Immunology

## 2021-02-02 ENCOUNTER — Encounter: Payer: Self-pay | Admitting: Allergy & Immunology

## 2021-02-02 ENCOUNTER — Ambulatory Visit (INDEPENDENT_AMBULATORY_CARE_PROVIDER_SITE_OTHER): Payer: Commercial Managed Care - PPO

## 2021-02-02 ENCOUNTER — Other Ambulatory Visit: Payer: Self-pay

## 2021-02-02 VITALS — BP 112/86 | HR 77 | Temp 97.3°F | Resp 18

## 2021-02-02 DIAGNOSIS — L299 Pruritus, unspecified: Secondary | ICD-10-CM

## 2021-02-02 DIAGNOSIS — J309 Allergic rhinitis, unspecified: Secondary | ICD-10-CM

## 2021-02-02 DIAGNOSIS — R1310 Dysphagia, unspecified: Secondary | ICD-10-CM

## 2021-02-02 DIAGNOSIS — J302 Other seasonal allergic rhinitis: Secondary | ICD-10-CM

## 2021-02-02 DIAGNOSIS — J3089 Other allergic rhinitis: Secondary | ICD-10-CM

## 2021-02-02 MED ORDER — EPINEPHRINE 0.3 MG/0.3ML IJ SOAJ: 0.3000 mg | INTRAMUSCULAR | 1 refills | Status: DC | PRN

## 2021-02-02 MED ORDER — DOXEPIN HCL 25 MG PO CAPS: 50.0000 mg | ORAL_CAPSULE | Freq: Every day | ORAL | 5 refills | Status: DC

## 2021-02-02 MED ORDER — LEVOCETIRIZINE DIHYDROCHLORIDE 5 MG PO TABS: 5.0000 mg | ORAL_TABLET | Freq: Two times a day (BID) | ORAL | 5 refills | Status: DC

## 2021-02-02 NOTE — Patient Instructions (Addendum)
1. Itching with P/SAR (grasses, ragweed, weeds, indoor molds, dust mites and cat) - We are going to continue with allergy shots at the same schedule. - Stop taking: Singulair (montelukast) - Continue taking: Xyzal (levocetirizine) 5mg  1-2 times daily and Nasacort one spray per nostril as needed. - Start taking: Doxepin 25 mg (one to two tablets at night --- this can cause sleepiness, which might be a good thing) - You can use an extra dose of the antihistamine, if needed, for breakthrough symptoms.   2. Dysphagia (difficulty swallowing) - Continue with Protonix twice daily.   3. Return in about 6 months (around 08/05/2021).    Please inform us of any Emergency Department visits, hospitalizations, or changes in symptoms. Call us before going to the ED for breathing or allergy symptoms since we might be able to fit you in for a sick visit. Feel free to contact us anytime with any questions, problems, or concerns.  It was a pleasure to see you again today!  Websites that have reliable patient information: 1. American Academy of Asthma, Allergy, and Immunology: www.aaaai.org 2. Food Allergy Research and Education (FARE): foodallergy.org 3. Mothers of Asthmatics: http://www.asthmacommunitynetwork.org 4. American College of Allergy, Asthma, and Immunology: www.acaai.org   COVID-19 Vaccine Information can be found at: ShippingScam.co.uk For questions related to vaccine distribution or appointments, please email [email or web address] [email or web address] or call [phone].   We realize that you might be concerned about having an allergic reaction to the COVID19 vaccines. To help with that concern, WE ARE OFFERING THE COVID19 VACCINES IN OUR OFFICE! Ask the front desk for dates!     "Like" [place] on Facebook and Instagram for our latest updates!      A healthy democracy works best when [employer] participate! Make sure you are registered to vote! If you  have moved or changed any of your contact information, you will need to get this updated before voting!  In some cases, you MAY be able to register to vote online: [email or web address]

## 2021-02-02 NOTE — Progress Notes (Signed)
FOLLOW UP  Date of Service/Encounter:  02/02/21   Assessment:   Seasonal and perennial allergic rhinitis(grasses, ragweed, weeds, indoor molds, dust mites and cat) - on allergen immunotherapy with maintenance reached December 2020  Itching- improved with allergen immunotherapy  Dysphagia- with a history of a benign esophageal mass in 2008  OSA - starting CPAP soon  Plan/Recommendations:   1. Itching with P/SAR (grasses, ragweed, weeds, indoor molds, dust mites and cat) - We are going to continue with allergy shots at the same schedule. - Stop taking: Singulair (montelukast) - Continue taking: Xyzal (levocetirizine) 56m 1-2 times daily and Nasacort one spray per nostril as needed. - Start taking: Doxepin 25 mg (one to two tablets at night --- this can cause sleepiness, which might be a good thing) - You can use an extra dose of the antihistamine, if needed, for breakthrough symptoms.   2. Dysphagia (difficulty swallowing) - Continue with Protonix twice daily.   3. Return in about 6 months (around 08/05/2021).   Subjective:   Christine GIERKEis a 50y.o. female presenting today for follow up of  Chief Complaint  Patient presents with  . Follow-up    Christine CORDELLhas a history of the following: Patient Active Problem List   Diagnosis Date Noted  . Hypothyroidism 01/12/2021  . Hypokalemia 01/12/2021  . [place]  . 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 Multiple thyroid nodules 06/29/2020  . Oropharyngeal dysphagia 05/06/2019  . Dysphagia, pharyngoesophageal phase 05/05/2019  . Vasomotor symptoms due to menopause 03/13/2019  . Genetic testing 06/03/2015  . 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  KArmaniiis a 50y.o. female presenting for a follow up visit.  She was last seen in February 2022 by CAlthea Charon  At that  time, she was continued on her allergy shots as well as Singulair, Xyzal, and Flonase Sensimist was added.  She was continued on Protonix for her dysphagia.  She was also started on albuterol for shortness of breath.  Since last visit, she has done well.   Allergic Rhinitis Symptom History: She remains on her montelukast as well as levocetirizine and Nasacort. Allergy shots have helped with the itching.  She has not required any antibiotics for sinus infections. She has not needed any prednisone for sinus infections or itching.   Kathrynis on allergen immunotherapy. Shereceives two injections. Immunotherapy script #1contains weeds, grasses and cat.Shecurrently receives 0.5108mf the RED vial (1/100). Immunotherapy script #2 containsmolds and dust mites.Shecurrently receives 0.32m61m the RED vial (1/100).[facility]Aside from some very localized hives, she has had no reactions.  She continues to have some itching despite the allergy shots. She is taking the Singulair and Xyzal. She is wondering if there is anything else that she can take to help with the itching. She does not know if Singulair is working. She has forgotten it at times and never feels much different.   She does have a history of fatty liver, although her LFTs and bilirubin have been normal. She had her thyroid taken out in October 2021 and she has gained a lot of weight.  She has a CPAP machine and she is going to get this programmed next Monday. She is tired all of the time. She also takes some other pills that keep her from getting good sleep.   Otherwise, there have been no changes  to her past medical history, surgical history, family history, or social history.    Review of Systems  Constitutional: Negative.  Negative for chills, fever, malaise/fatigue and weight loss.  HENT: Negative for congestion, ear discharge, ear pain and sinus pain.   Eyes:  Negative for pain, discharge and redness.  Respiratory: Negative for cough, sputum production, shortness of breath and wheezing.   Cardiovascular: Negative.  Negative for chest pain and palpitations.  Gastrointestinal: Negative for abdominal pain, constipation, diarrhea, heartburn, nausea and vomiting.  Skin: Positive for itching. Negative for rash.  Neurological: Negative for dizziness and headaches.  Endo/Heme/Allergies: Positive for environmental allergies. Does not bruise/bleed easily.       Objective:   Blood pressure 112/86, pulse 77, temperature (!) 97.3 F (36.3 C), temperature source Temporal, resp. rate 18, last menstrual period 05/22/2015, SpO2 96 %. There is no height or weight on file to calculate BMI.   Physical Exam:  Physical Exam Constitutional:      Appearance: She is well-developed.  HENT:     Head: Normocephalic and atraumatic.     Right Ear: Tympanic membrane, ear canal and external ear normal.     Left Ear: Tympanic membrane, ear canal and external ear normal.     Nose: No nasal deformity, septal deviation, mucosal edema or rhinorrhea.     Right Turbinates: Enlarged and swollen.     Left Turbinates: Enlarged and swollen.     Right Sinus: No maxillary sinus tenderness or frontal sinus tenderness.     Left Sinus: No maxillary sinus tenderness or frontal sinus tenderness.     Mouth/Throat:     Mouth: Mucous membranes are not pale and not dry.     Pharynx: Uvula midline.  Eyes:     General:        Right eye: No discharge.        Left eye: No discharge.     Conjunctiva/sclera: Conjunctivae normal.     Right eye: Right conjunctiva is not injected. No chemosis.    Left eye: Left conjunctiva is not injected. No chemosis.    Pupils: Pupils are equal, round, and reactive to light.  Cardiovascular:     Rate and Rhythm: Normal rate and regular rhythm.     Heart sounds: Normal heart sounds.  Pulmonary:     Effort: Pulmonary effort is normal. No tachypnea,  accessory muscle usage or respiratory distress.     Breath sounds: Normal breath sounds. No wheezing, rhonchi or rales.     Comments: Moving air well in all lung fields. No increased work of breathing noted. Chest:     Chest wall: No tenderness.  Lymphadenopathy:     Cervical: No cervical adenopathy.  Skin:    General: Skin is warm.     Capillary Refill: Capillary refill takes less than 2 seconds.     Coloration: Skin is not pale.     Findings: No abrasion, erythema, petechiae or rash. Rash is not papular, urticarial or vesicular.     Comments: Some excoriations present.   Neurological:     Mental Status: She is alert.      Diagnostic studies: none      Salvatore Marvel, MD  [facility] [place] [facility] [place]

## 2021-02-03 ENCOUNTER — Encounter: Payer: Self-pay | Admitting: Allergy & Immunology

## 2021-02-07 ENCOUNTER — Ambulatory Visit: Payer: Commercial Managed Care - PPO | Admitting: Orthopedic Surgery

## 2021-02-07 ENCOUNTER — Other Ambulatory Visit: Payer: Self-pay

## 2021-02-07 ENCOUNTER — Ambulatory Visit: Payer: Commercial Managed Care - PPO

## 2021-02-07 ENCOUNTER — Encounter: Payer: Self-pay | Admitting: Orthopedic Surgery

## 2021-02-07 VITALS — BP 147/103 | HR 79 | Ht 62.0 in | Wt 290.0 lb

## 2021-02-07 DIAGNOSIS — M25562 Pain in left knee: Secondary | ICD-10-CM

## 2021-02-07 DIAGNOSIS — G8929 Other chronic pain: Secondary | ICD-10-CM

## 2021-02-07 DIAGNOSIS — M1712 Unilateral primary osteoarthritis, left knee: Secondary | ICD-10-CM

## 2021-02-07 NOTE — Patient Instructions (Signed)
You have received an injection of steroids into the joint. 15% of patients will have increased pain within the 24 hours postinjection.   This is transient and will go away.   We recommend that you use ice packs on the injection site for 20 minutes every 2 hours and extra strength Tylenol 2 tablets every 8 as needed until the pain resolves.  If you continue to have pain after taking the Tylenol and using the ice please call the office for further instructions.   Stay on meloxicam

## 2021-02-07 NOTE — Progress Notes (Signed)
NEW PROBLEM//OFFICE VISIT  Summary assessment and plan: Recommend nonoperative treatment with injection Procedure note Injected left knee  A steroid injection was performed at left knee using 1% plain Lidocaine and 6 mg of Celestone 2 cc 1% lidocaine   Chief Complaint  Patient presents with  . Knee Pain    Left/ has history of knee surgery, knee arthroscopy ? 2012    50-year-old female status postarthroscopy left and right knee comes in with left knee pain medial joint which is worsening.  Pain does not appear to be radicular in nature its been worse over the last few months is hard to get up after sitting she takes hydrocodone at night and meloxicam during the day 15 mg.   Review of Systems  All other systems reviewed and are negative.   L5-S1 tired tired night sweats itching chest pain heart palpitations leg swelling back pain joint pain frequency urgency heartburn nausea diarrhea dizziness depression nervousness Past Medical History:  Diagnosis Date  . Anxiety   . Carpal tunnel syndrome    bi lat hands  . Chronic leg pain    right  . Chronic pain of left knee   . Depression   . Diverticulitis   . GERD (gastroesophageal reflux disease)   . Heart murmur   . History of kidney stones   . Hypertension   . Hyperthyroidism   . Sleep apnea     no C- Pap    Past Surgical History:  Procedure Laterality Date  . ACNE CYST REMOVAL    . CHOLECYSTECTOMY    . DILATION AND CURETTAGE OF UTERUS  1998  . ESOPHAGOGASTRODUODENOSCOPY (EGD) WITH PROPOFOL N/A 06/13/2019   Procedure: ESOPHAGOGASTRODUODENOSCOPY (EGD) WITH PROPOFOL;  Surgeon: Rehman, Najeeb U, MD;  Location: [facility];  Service: Endoscopy;  Laterality: N/A;  7:30  . KNEE SURGERY    . MALONEY DILATION  06/13/2019   Procedure: MALONEY DILATION;  Surgeon: Rehman, Najeeb U, MD;  Location: [facility];  Service: Endoscopy;;  . MASS EXCISION    . THORACOTOMY Right 2016  . THYROIDECTOMY N/A 07/01/2020   Procedure: TOTAL  THYROIDECTOMY WITH Central compartment lymph node disection;  Surgeon: Gerkin, Todd, MD;  Location: [facility];  Service: General;  Laterality: N/A;    Family History  Problem Relation Age of Onset  . Diabetes Mother   . Hypertension Mother   . Breast cancer Mother 47       dx again at 65; PALB2 and CHEK2 positive  . Diabetes Father   . Hypertension Father   . Coronary artery disease Father   . Hypertension Sister   . Breast cancer Sister        diagnosed in her 30's  . Thyroid cancer Maternal Uncle 46       medullary  . Lung cancer Maternal Grandmother   . Leukemia Maternal Grandfather 67       dx with hairy cell leukemia at 67; CLL at 72  . Lymphoma Maternal Grandfather 82       hodgkins lymphoma  . Brain cancer Paternal Grandmother   . Prostate cancer Paternal Grandfather   . Lung cancer Maternal Aunt 57  . Lymphoma Maternal Uncle 60       follicular lymphoma  . Allergic rhinitis Neg Hx   . Asthma Neg Hx    Social History   Tobacco Use  . Smoking status: Former Smoker    Packs/day: 0.50    Years: 5.00    Pack years: 2.50      Types: Cigarettes    Quit date: 09/15/2017    Years since quitting: 3.4  . Smokeless tobacco: Never Used  Vaping Use  . Vaping Use: Never used  Substance Use Topics  . Alcohol use: No  . Drug use: No    Allergies  Allergen Reactions  . Penicillins Rash    Has patient had a PCN reaction causing immediate rash, facial/tongue/throat swelling, SOB or lightheadedness with hypotension: {Yes Has patient had a PCN reaction causing severe rash involving mucus membranes or skin necrosis:unknown Has patient had a PCN reaction that required hospitalizationNo Has patient had a PCN reaction occurring within the last 10 years:No If all of the above answers are "NO", then may proceed with Cephalosporin use.     Current Meds  Medication Sig  . atenolol (TENORMIN) 50 MG tablet Take 1 tablet (50 mg total) by mouth daily. (Patient taking differently: Take  50 mg by mouth at bedtime.)  . Cholecalciferol (VITAMIN D3) 50 MCG (2000 UT) TABS Take 2,000 Units by mouth daily.  Marland Kitchen doxepin (SINEQUAN) 25 MG capsule Take 2 capsules (50 mg total) by mouth at bedtime for 30 doses.  Marland Kitchen EPINEPHrine 0.3 mg/0.3 mL IJ SOAJ injection Inject 0.3 mg into the muscle as needed for anaphylaxis.  Marland Kitchen gabapentin (NEURONTIN) 300 MG capsule Take 1 capsule (300 mg total) by mouth 3 (three) times daily. (Patient taking differently: Take 300 mg by mouth at bedtime.)  . hydrochlorothiazide (HYDRODIURIL) 25 MG tablet Take 25 mg by mouth daily.   Marland Kitchen HYDROcodone-acetaminophen (NORCO/VICODIN) 5-325 MG tablet Take 1 tablet by mouth every 6 (six) hours as needed for moderate pain.  Marland Kitchen levocetirizine (XYZAL) 5 MG tablet Take 1 tablet (5 mg total) by mouth in the morning and at bedtime.  Marland Kitchen levothyroxine (SYNTHROID) 175 MCG tablet Take 1 tablet (175 mcg total) by mouth daily.  Marland Kitchen LORazepam (ATIVAN) 1 MG tablet Take 1 mg by mouth at bedtime.   Marland Kitchen losartan (COZAAR) 100 MG tablet Take 100 mg by mouth daily.   . meloxicam (MOBIC) 15 MG tablet Take 15 mg by mouth daily.  . methocarbamol (ROBAXIN-750) 750 MG tablet Take 1 tablet (750 mg total) by mouth at bedtime.  . Multiple Vitamin (MULTIVITAMIN) tablet Take 2 tablets by mouth daily. gummy  . ondansetron (ZOFRAN ODT) 4 MG disintegrating tablet Take 1 tablet (4 mg total) by mouth every 8 (eight) hours as needed for nausea or vomiting.  Marland Kitchen oxybutynin (DITROPAN) 5 MG tablet Take 5 mg by mouth 2 (two) times daily.  . pantoprazole (PROTONIX) 40 MG tablet Take 40 mg by mouth 2 (two) times daily.  . rosuvastatin (CRESTOR) 10 MG tablet Take 10 mg by mouth daily.  Marland Kitchen venlafaxine XR (EFFEXOR-XR) 75 MG 24 hr capsule Take 225 mg by mouth daily.    BP (!) 147/103   Pulse 79   Ht 5' 2" (1.575 m)   Wt 290 lb (131.5 kg)   LMP 05/22/2015   BMI 53.04 kg/m   Physical Exam  General appearance: Well-developed well-nourished no gross deformities  Cardiovascular  normal pulse and perfusion normal color without edema  Neurologically no sensation loss or deficits or pathologic reflexes  Psychological: Awake alert and oriented x3 mood and affect normal  Skin no lacerations or ulcerations no nodularity no palpable masses, no erythema or nodularity  Musculoskeletal:   Left knee:  Medial pain tenderness and rom deficit of 25 degrees feels stable though.   MEDICAL DECISION MAKING  A.  Encounter Diagnosis  Name  Primary?  . Chronic pain of left knee Yes    B. 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 MANAGEMENT   Recommend injection  Recommend weight loss  Cannot do any surgery.  She is a candidate for possible hyaluronic acid injections if the cortisone does not work  Will need a knee replacement in the future if she can get her BMI down to 40 patient aware  No orders of the defined types were placed in this encounter.     Stanley Harrison, MD  02/07/2021 3:01 PM 

## 2021-02-09 DIAGNOSIS — R32 Unspecified urinary incontinence: Secondary | ICD-10-CM | POA: Insufficient documentation

## 2021-02-09 DIAGNOSIS — F329 Major depressive disorder, single episode, unspecified: Secondary | ICD-10-CM | POA: Insufficient documentation

## 2021-02-09 DIAGNOSIS — R002 Palpitations: Secondary | ICD-10-CM | POA: Insufficient documentation

## 2021-02-09 DIAGNOSIS — G8929 Other chronic pain: Secondary | ICD-10-CM | POA: Insufficient documentation

## 2021-02-09 DIAGNOSIS — R079 Chest pain, unspecified: Secondary | ICD-10-CM | POA: Insufficient documentation

## 2021-02-09 DIAGNOSIS — R7989 Other specified abnormal findings of blood chemistry: Secondary | ICD-10-CM | POA: Insufficient documentation

## 2021-02-09 DIAGNOSIS — R61 Generalized hyperhidrosis: Secondary | ICD-10-CM | POA: Insufficient documentation

## 2021-02-13 ENCOUNTER — Encounter: Payer: Self-pay | Admitting: Endocrinology

## 2021-02-15 ENCOUNTER — Telehealth: Payer: Self-pay | Admitting: Endocrinology

## 2021-02-15 NOTE — Telephone Encounter (Signed)
Patient called re: Patient states PCP sent Dr. Loanne Drilling lab results on Feb 11, 2021. If not received by Dr. Loanne Drilling.   Patient requests to be called at pH# [phone] re: Based on lab results-Patient requests to know about dosage for Levothyroxine.  Patient has been schedule for follow up.  Patient requests a new RX for Levothyroxine based on lab results be sent to:   [place], [place] Phone:  [phone]  Fax:  [phone]

## 2021-02-15 NOTE — Telephone Encounter (Signed)
Patient called back stating her phone died.   She asked if we could call in her medication for Levothyroxine (patient says her labs are not where they need to be and needed to probably increase the dose) to   [place], [place] Phone:  [phone]  Fax:  [phone]     - She said we could not call it in until Dr Loanne Drilling saw the lab results from her PCP. They said they faxed them over on 02/11/21 but I have not seen them yet. They may have gone to the [place] fax. I did give patient the [phone] fax number so I can be on the lookout on that one. Please advise if we have gotten those yet on the [phone].

## 2021-02-15 NOTE — Telephone Encounter (Signed)
New message   Patient calling for test results & new prescription to [place], [place]

## 2021-02-16 ENCOUNTER — Ambulatory Visit (INDEPENDENT_AMBULATORY_CARE_PROVIDER_SITE_OTHER): Payer: Commercial Managed Care - PPO

## 2021-02-16 DIAGNOSIS — J309 Allergic rhinitis, unspecified: Secondary | ICD-10-CM | POA: Diagnosis not present

## 2021-02-16 NOTE — Telephone Encounter (Signed)
Lab work received and uploaded in the "media" tab.

## 2021-02-16 NOTE — Telephone Encounter (Signed)
Patient requests to be called at ph# [phone] re: status of labs sent to Dr. Loanne Drilling from Patient's PCP. Also, Patient states based on her lab results she requests a new RX with dosage change for Levothyroxine be sent to:  [place], [place] Phone:  [phone]  Fax:  [phone]     Patient requests the above Rx with dosage increase be sent asap due to Patient is completely out of the Levothyroxine 175 mcg.  Patient states she has left previous telephone message and MyChart message re: the above.

## 2021-02-16 NOTE — Telephone Encounter (Signed)
Patient called again re: Patient is out of of Levothyroxine and is still waiting for her new RX with dosage change based on lab results sent to [facility] previous requests.

## 2021-02-17 MED ORDER — LEVOTHYROXINE SODIUM 200 MCG PO TABS: 200.0000 ug | ORAL_TABLET | Freq: Every day | ORAL | 3 refills | Status: DC

## 2021-02-17 NOTE — Telephone Encounter (Signed)
OK, I have sent a prescription to your pharmacy.  

## 2021-02-17 NOTE — Telephone Encounter (Signed)
Message sent thru MyChart 

## 2021-02-17 NOTE — Telephone Encounter (Signed)
Lab results are in the Media. Please review

## 2021-02-18 ENCOUNTER — Other Ambulatory Visit: Payer: Self-pay

## 2021-02-18 ENCOUNTER — Ambulatory Visit (INDEPENDENT_AMBULATORY_CARE_PROVIDER_SITE_OTHER): Payer: Commercial Managed Care - PPO | Admitting: Endocrinology

## 2021-02-18 VITALS — BP 114/80 | HR 74 | Ht 62.0 in | Wt 292.6 lb

## 2021-02-18 DIAGNOSIS — C73 Malignant neoplasm of thyroid gland: Secondary | ICD-10-CM | POA: Diagnosis not present

## 2021-02-18 DIAGNOSIS — E890 Postprocedural hypothyroidism: Secondary | ICD-10-CM | POA: Diagnosis not present

## 2021-02-18 NOTE — Patient Instructions (Signed)
Blood tests are requested for you today.  Please do in 1 month.  We'll let you know about the results.  Please come back for a follow-up appointment in 4 months.

## 2021-02-18 NOTE — Progress Notes (Signed)
 Subjective:    Patient ID: Christine Mason, female    DOB: 09/17/1971, 50 y.o.   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  10/21 thyroidect: path shows PTC T1N1Mx.   12/21 RAI 98 mCi.   12/21 post-therapy scan: Thyroid remnant.   2/22 TG undetectable (Ab neg).   Denies neck pain and swelling.   She takes synthroid as rx'ed.  She again reports weight gain and fatigue.   Past Medical History:  Diagnosis Date  . Anxiety   . Carpal tunnel syndrome    bi lat hands  . Chronic leg pain    right  . Chronic pain of left knee   . Depression   . Diverticulitis   . GERD (gastroesophageal reflux disease)   . Heart murmur   . History of kidney stones   . Hypertension   . Hyperthyroidism   . Sleep apnea     no C- Pap    Past Surgical History:  Procedure Laterality Date  . ACNE CYST REMOVAL    . CHOLECYSTECTOMY    . DILATION AND CURETTAGE OF UTERUS  1998  . ESOPHAGOGASTRODUODENOSCOPY (EGD) WITH PROPOFOL N/A 06/13/2019   Procedure: ESOPHAGOGASTRODUODENOSCOPY (EGD) WITH PROPOFOL;  Surgeon: Rehman, Najeeb U, MD;  Location: [facility];  Service: Endoscopy;  Laterality: N/A;  7:30  . KNEE SURGERY    . MALONEY DILATION  06/13/2019   Procedure: MALONEY DILATION;  Surgeon: Rehman, Najeeb U, MD;  Location: [facility];  Service: Endoscopy;;  . MASS EXCISION    . THORACOTOMY Right 2016  . THYROIDECTOMY N/A 07/01/2020   Procedure: TOTAL THYROIDECTOMY WITH Central compartment lymph node disection;  Surgeon: Gerkin, Todd, MD;  Location: [facility];  Service: General;  Laterality: N/A;    Social History   Socioeconomic History  . Marital status: Married    Spouse name: Not on file  . Number of children: 0  . Years of education: Not on file  . Highest education level: Not on file  Occupational History    Employer: [employer]   Tobacco Use  . Smoking status: Former Smoker    Packs/day: 0.50    Years: 5.00    Pack years: 2.50    Types: Cigarettes    Quit date: 09/15/2017    Years since quitting: 3.4  . Smokeless tobacco: Never Used  Vaping Use  . Vaping Use: Never used  Substance and Sexual Activity  . Alcohol use: No  . Drug use: No  . Sexual activity: Not Currently    Birth control/protection: Post-menopausal  Other Topics Concern  . Not on file  Social History Narrative  . Not on file   Social Determinants of Health   Financial Resource Strain: Not on file  Food Insecurity: Not on file  Transportation Needs: Not on file  Physical Activity: Not on file  Stress: Not on file  Social Connections: Not on file  Intimate Partner Violence: Not on file    Current Outpatient Medications on File Prior to Visit  Medication Sig Dispense Refill  . atenolol (TENORMIN) 50 MG tablet Take 1 tablet (50 mg total) by mouth daily. (Patient taking differently: Take 50 mg by mouth at bedtime.)    . Cholecalciferol (VITAMIN D3) 50 MCG (2000 UT) TABS Take 2,000 Units by mouth daily.    .   doxepin (SINEQUAN) 25 MG capsule Take 2 capsules (50 mg total) by mouth at bedtime for 30 doses. 60 capsule 5  . EPINEPHrine 0.3 mg/0.3 mL IJ SOAJ injection Inject 0.3 mg into the muscle as needed for anaphylaxis. 2 each 1  . gabapentin (NEURONTIN) 300 MG capsule Take 1 capsule (300 mg total) by mouth 3 (three) times daily. (Patient taking differently: Take 300 mg by mouth at bedtime.) 90 capsule 5  . hydrochlorothiazide (HYDRODIURIL) 25 MG tablet Take 25 mg by mouth daily.     . HYDROcodone-acetaminophen (NORCO/VICODIN) 5-325 MG tablet Take 1 tablet by mouth every 6 (six) hours as needed for moderate pain.    . levocetirizine (XYZAL) 5 MG tablet Take 1 tablet (5 mg total) by mouth in the morning and at bedtime. 60 tablet 5  . levothyroxine (SYNTHROID) 200 MCG tablet Take 1 tablet (200 mcg total) by mouth daily. 90 tablet 3  .  LORazepam (ATIVAN) 1 MG tablet Take 1 mg by mouth at bedtime.     . losartan (COZAAR) 100 MG tablet Take 100 mg by mouth daily.     . meloxicam (MOBIC) 15 MG tablet Take 15 mg by mouth daily.    . methocarbamol (ROBAXIN-750) 750 MG tablet Take 1 tablet (750 mg total) by mouth at bedtime.    . Multiple Vitamin (MULTIVITAMIN) tablet Take 2 tablets by mouth daily. gummy    . ondansetron (ZOFRAN ODT) 4 MG disintegrating tablet Take 1 tablet (4 mg total) by mouth every 8 (eight) hours as needed for nausea or vomiting. 10 tablet 0  . oxybutynin (DITROPAN) 5 MG tablet Take 5 mg by mouth 2 (two) times daily.    . pantoprazole (PROTONIX) 40 MG tablet Take 40 mg by mouth 2 (two) times daily.    . rosuvastatin (CRESTOR) 10 MG tablet Take 10 mg by mouth daily.    . venlafaxine XR (EFFEXOR-XR) 75 MG 24 hr capsule Take 225 mg by mouth daily.     No current facility-administered medications on file prior to visit.    Allergies  Allergen Reactions  . Penicillins Rash    Has patient had a PCN reaction causing immediate rash, facial/tongue/throat swelling, SOB or lightheadedness with hypotension: {Yes Has patient had a PCN reaction causing severe rash involving mucus membranes or skin necrosis:unknown Has patient had a PCN reaction that required hospitalizationNo Has patient had a PCN reaction occurring within the last 10 years:No If all of the above answers are "NO", then may proceed with Cephalosporin use.     Family History  Problem Relation Age of Onset  . Diabetes Mother   . Hypertension Mother   . Breast cancer Mother 47       dx again at 65; PALB2 and CHEK2 positive  . Diabetes Father   . Hypertension Father   . Coronary artery disease Father   . Hypertension Sister   . Breast cancer Sister        diagnosed in her 30's  . Thyroid cancer Maternal Uncle 46       medullary  . Lung cancer Maternal Grandmother   . Leukemia Maternal Grandfather 67       dx with hairy cell leukemia at 67; CLL  at 72  . Lymphoma Maternal Grandfather 82       hodgkins lymphoma  . Brain cancer Paternal Grandmother   . Prostate cancer Paternal Grandfather   . Lung cancer Maternal Aunt 57  . Lymphoma Maternal Uncle 60         follicular lymphoma  . Allergic rhinitis Neg Hx   . Asthma Neg Hx     BP 114/80 (BP Location: Right Arm, Patient Position: Sitting, Cuff Size: Large)   Pulse 74   Ht 5' 2" (1.575 m)   Wt 292 lb 9.6 oz (132.7 kg)   LMP 05/22/2015   SpO2 95%   BMI 53.52 kg/m    Review of Systems     Objective:   Physical Exam VITAL SIGNS:  See vs page GENERAL: no distress Neck: a healed scar is present.  I do not appreciate a nodule in the thyroid or elsewhere in the neck       Assessment & Plan:  Hypothyroidism: pt is advised to continue the same synthroid for now PTC: due for recheck soon  Patient Instructions  Blood tests are requested for you today.  Please do in 1 month.  We'll let you know about the results.  Please come back for a follow-up appointment in 4 months.

## 2021-03-04 ENCOUNTER — Encounter: Payer: Self-pay | Admitting: Emergency Medicine

## 2021-03-04 ENCOUNTER — Ambulatory Visit
Admission: EM | Admit: 2021-03-04 | Discharge: 2021-03-04 | Disposition: A | Discharge status: Home / Self Care | Payer: Commercial Managed Care - PPO | Attending: Emergency Medicine | Admitting: Emergency Medicine

## 2021-03-04 ENCOUNTER — Other Ambulatory Visit: Payer: Self-pay

## 2021-03-04 DIAGNOSIS — G5601 Carpal tunnel syndrome, right upper limb: Secondary | ICD-10-CM

## 2021-03-04 DIAGNOSIS — M79641 Pain in right hand: Secondary | ICD-10-CM

## 2021-03-04 MED ORDER — DEXAMETHASONE SODIUM PHOSPHATE 10 MG/ML IJ SOLN
10.0000 mg | Freq: Once | INTRAMUSCULAR | Status: AC
  Administered 2021-03-04: 10 mg via INTRAMUSCULAR

## 2021-03-04 MED ORDER — PREDNISONE 20 MG PO TABS: 20.0000 mg | ORAL_TABLET | Freq: Two times a day (BID) | ORAL | 0 refills | Status: DC

## 2021-03-04 NOTE — Discharge Instructions (Addendum)
Continue conservative management of rest, ice, and elevation Steroid shot given Wrist brace given.  Wear at all times for comfort Follow up with orthopedist for further evaluation and management Return or go to the ER if you have any new or worsening symptoms (fever, chills, chest pain, redness, swelling, bruising, etc...)

## 2021-03-04 NOTE — ED Provider Notes (Signed)
[place]   [id number] 03/04/21 Arrival Time: 21  CC: RT hand PAIN  SUBJECTIVE: History from: patient. Christine Mason is a 50 y.o. female complains of RT hand pain that began this morning at 4 am.  Denies a precipitating event or specific injury.  Does admit to repetitive computer use for work.  Localizes the pain to the RT hand..  Describes the pain as intermittent and throbbing in character.  Has tried OTC medications without relief.  Denies aggravating factors.  Denies similar symptoms in the past.  Complains of numbness/ tingling.  Denies fever, chills, erythema, ecchymosis, effusion, weakness.  ROS: As per HPI.  All other pertinent ROS negative.     Past Medical History:  Diagnosis Date   Anxiety    Carpal tunnel syndrome    bi lat hands   Chronic leg pain    right   Chronic pain of left knee    Depression    Diverticulitis    GERD (gastroesophageal reflux disease)    Heart murmur    History of kidney stones    Hypertension    Hyperthyroidism    Sleep apnea     no C- Pap   Past Surgical History:  Procedure Laterality Date   ACNE CYST REMOVAL     CHOLECYSTECTOMY     DILATION AND CURETTAGE OF UTERUS  1998   ESOPHAGOGASTRODUODENOSCOPY (EGD) WITH PROPOFOL N/A 06/13/2019   Procedure: ESOPHAGOGASTRODUODENOSCOPY (EGD) WITH PROPOFOL;  Surgeon: Rogene Houston, MD;  Location: [facility];  Service: Endoscopy;  Laterality: N/A;  7:30   KNEE SURGERY     MALONEY DILATION  06/13/2019   Procedure: MALONEY DILATION;  Surgeon: Rogene Houston, MD;  Location: [facility];  Service: Endoscopy;;   MASS EXCISION     THORACOTOMY Right 2016   THYROIDECTOMY N/A 07/01/2020   Procedure: TOTAL THYROIDECTOMY WITH Central compartment lymph node disection;  Surgeon: Armandina Gemma, MD;  Location: [facility];  Service: General;  Laterality: N/A;   Allergies  Allergen Reactions   Penicillins Rash    Has patient had a PCN reaction causing immediate rash, facial/tongue/throat  swelling, SOB or lightheadedness with hypotension: {Yes Has patient had a PCN reaction causing severe rash involving mucus membranes or skin necrosis:unknown Has patient had a PCN reaction that required hospitalizationNo Has patient had a PCN reaction occurring within the last 10 years:No If all of the above answers are "NO", then may proceed with Cephalosporin use.    No current facility-administered medications on file prior to encounter.   Current Outpatient Medications on File Prior to Encounter  Medication Sig Dispense Refill   atenolol (TENORMIN) 50 MG tablet Take 1 tablet (50 mg total) by mouth daily. (Patient taking differently: Take 50 mg by mouth at bedtime.)     Cholecalciferol (VITAMIN D3) 50 MCG (2000 UT) TABS Take 2,000 Units by mouth daily.     doxepin (SINEQUAN) 25 MG capsule Take 2 capsules (50 mg total) by mouth at bedtime for 30 doses. 60 capsule 5   EPINEPHrine 0.3 mg/0.3 mL IJ SOAJ injection Inject 0.3 mg into the muscle as needed for anaphylaxis. 2 each 1   gabapentin (NEURONTIN) 300 MG capsule Take 1 capsule (300 mg total) by mouth 3 (three) times daily. (Patient taking differently: Take 300 mg by mouth at bedtime.) 90 capsule 5   hydrochlorothiazide (HYDRODIURIL) 25 MG tablet Take 25 mg by mouth daily.      HYDROcodone-acetaminophen (NORCO/VICODIN) 5-325 MG tablet Take 1 tablet by  mouth every 6 (six) hours as needed for moderate pain.     levocetirizine (XYZAL) 5 MG tablet Take 1 tablet (5 mg total) by mouth in the morning and at bedtime. 60 tablet 5   levothyroxine (SYNTHROID) 200 MCG tablet Take 1 tablet (200 mcg total) by mouth daily. 90 tablet 3   LORazepam (ATIVAN) 1 MG tablet Take 1 mg by mouth at bedtime.      losartan (COZAAR) 100 MG tablet Take 100 mg by mouth daily.      meloxicam (MOBIC) 15 MG tablet Take 15 mg by mouth daily.     methocarbamol (ROBAXIN-750) 750 MG tablet Take 1 tablet (750 mg total) by mouth at bedtime.     Multiple Vitamin (MULTIVITAMIN)  tablet Take 2 tablets by mouth daily. gummy     ondansetron (ZOFRAN ODT) 4 MG disintegrating tablet Take 1 tablet (4 mg total) by mouth every 8 (eight) hours as needed for nausea or vomiting. 10 tablet 0   oxybutynin (DITROPAN) 5 MG tablet Take 5 mg by mouth 2 (two) times daily.     pantoprazole (PROTONIX) 40 MG tablet Take 40 mg by mouth 2 (two) times daily.     rosuvastatin (CRESTOR) 10 MG tablet Take 10 mg by mouth daily.     venlafaxine XR (EFFEXOR-XR) 75 MG 24 hr capsule Take 225 mg by mouth daily.     Social History   Socioeconomic History   Marital status: Married    Spouse name: Not on file   Number of children: 0   Years of education: Not on file   Highest education level: Not on file  Occupational History    Employer: [employer]  Tobacco Use   Smoking status: Former    Packs/day: 0.50    Years: 5.00    Pack years: 2.50    Types: Cigarettes    Quit date: 09/15/2017    Years since quitting: 3.4   Smokeless tobacco: Never  Vaping Use   Vaping Use: Never used  Substance and Sexual Activity   Alcohol use: No   Drug use: No   Sexual activity: Not Currently    Birth control/protection: Post-menopausal  Other Topics Concern   Not on file  Social History Narrative   Not on file   Social Determinants of Health   Financial Resource Strain: Not on file  Food Insecurity: Not on file  Transportation Needs: Not on file  Physical Activity: Not on file  Stress: Not on file  Social Connections: Not on file  Intimate Partner Violence: Not on file   Family History  Problem Relation Age of Onset   Diabetes Mother    Hypertension Mother    Breast cancer Mother 40       dx again at 7; PALB2 and CHEK2 positive   Diabetes Father    Hypertension Father    Coronary artery disease Father    Hypertension Sister    Breast cancer Sister        diagnosed in her 53's   Thyroid cancer Maternal Uncle 46       medullary   Lung cancer Maternal Grandmother    Leukemia  Maternal Grandfather 67       dx with hairy cell leukemia at 104; CLL at 64   Lymphoma Maternal Grandfather 82       hodgkins lymphoma   Brain cancer Paternal Grandmother    Prostate cancer Paternal Grandfather    Lung cancer Maternal Aunt 57  Lymphoma Maternal Uncle 60       follicular lymphoma   Allergic rhinitis Neg Hx    Asthma Neg Hx     OBJECTIVE:  Vitals:   03/04/21 1628  BP: (!) 159/125  Pulse: 95  Resp: 16  Temp: 98 F (36.7 C)  TempSrc: Temporal  SpO2: 93%    General appearance: ALERT; in no acute distress.  Head: NCAT Lungs: Normal respiratory effort CV: Radial pulse 2+ Musculoskeletal: RT hand Inspection: Skin warm, dry, clear and intact without obvious erythema, effusion, or ecchymosis.  Palpation: TTP over 3-5 digits ROM: FROM active and passive + Phalen's sign Skin: warm and dry Neurologic: Ambulates without difficulty; Sensation intact about the upper extremities Psychological: alert and cooperative; normal mood and affect   ASSESSMENT & PLAN:  1. Right hand pain   2. Carpal tunnel syndrome of right wrist    Meds ordered this encounter  Medications   DISCONTD: predniSONE (DELTASONE) 20 MG tablet    Sig: Take 1 tablet (20 mg total) by mouth 2 (two) times daily with a meal for 5 days.    Dispense:  10 tablet    Refill:  0    Order Specific Question:   Supervising Provider    Answer:   Raylene Everts [[id number]]   dexamethasone (DECADRON) injection 10 mg    Continue conservative management of rest, ice, and elevation Steroid shot given Wrist brace given.  Wear at all times for comfort Follow up with orthopedist for further evaluation and management Return or go to the ER if you have any new or worsening symptoms (fever, chills, chest pain, redness, swelling, bruising, etc...)   Reviewed expectations re: course of current medical issues. Questions answered. Outlined signs and symptoms indicating need for more acute intervention. Patient  verbalized understanding. After Visit Summary given.     Lestine Box, PA-C 03/04/21 1705

## 2021-03-04 NOTE — ED Triage Notes (Signed)
Right hand pain that shoots up right arm since 4am

## 2021-03-11 ENCOUNTER — Ambulatory Visit (INDEPENDENT_AMBULATORY_CARE_PROVIDER_SITE_OTHER): Payer: Commercial Managed Care - PPO

## 2021-03-11 DIAGNOSIS — J309 Allergic rhinitis, unspecified: Secondary | ICD-10-CM | POA: Diagnosis not present

## 2021-03-15 ENCOUNTER — Other Ambulatory Visit: Payer: Self-pay

## 2021-03-15 ENCOUNTER — Ambulatory Visit: Payer: Commercial Managed Care - PPO | Admitting: Cardiology

## 2021-03-15 ENCOUNTER — Encounter: Payer: Self-pay | Admitting: Cardiology

## 2021-03-15 VITALS — BP 173/83 | HR 88 | Temp 98.1°F | Resp 17 | Ht 62.0 in | Wt 298.0 lb

## 2021-03-15 DIAGNOSIS — I10 Essential (primary) hypertension: Secondary | ICD-10-CM

## 2021-03-15 DIAGNOSIS — R0789 Other chest pain: Secondary | ICD-10-CM

## 2021-03-15 DIAGNOSIS — R0600 Dyspnea, unspecified: Secondary | ICD-10-CM

## 2021-03-15 DIAGNOSIS — Z6841 Body Mass Index (BMI) 40.0 and over, adult: Secondary | ICD-10-CM

## 2021-03-15 DIAGNOSIS — Z9989 Dependence on other enabling machines and devices: Secondary | ICD-10-CM

## 2021-03-15 DIAGNOSIS — E7800 Pure hypercholesterolemia, unspecified: Secondary | ICD-10-CM

## 2021-03-15 DIAGNOSIS — R0609 Other forms of dyspnea: Secondary | ICD-10-CM

## 2021-03-15 DIAGNOSIS — G4733 Obstructive sleep apnea (adult) (pediatric): Secondary | ICD-10-CM

## 2021-03-15 MED ORDER — AMLODIPINE BESYLATE 5 MG PO TABS
5.0000 mg | ORAL_TABLET | Freq: Every day | ORAL | 2 refills | Status: DC
Start: 2021-03-15 — End: 2021-04-07

## 2021-03-15 NOTE — Progress Notes (Signed)
Primary Physician/Referring:  Celene Squibb, MD  Patient ID: Christine Mason, female    DOB: 1971/02/13, 50 y.o.   MRN: [id number]  Chief Complaint  Patient presents with   New Patient (Initial Visit)   Chest Pain    Ref by Valentino Nose, NP   HPI:    Christine Mason  is a 50 y.o. Caucasian female with hypertension, hyperlipidemia, morbid obesity who presents for evaluation and management of chest pain and dyspnea on exertion.  Over the past few months, she has noticed retrosternal pain, occurs while she is at work and has to stop working to get relief.  She has also had these episodes sometimes at rest.  Episodes last a few minutes and spontaneously resolved.  She has chronic shortness of breath but has noticed gradually worsening and she realizes it is probably related to her sedentary lifestyle and obesity.  She also has obstructive sleep apnea but has not been able to use CPAP.  She is a mouth breather and it has become difficult for her to use the CPAP covering her nose and mouth.  She has stopped using the CPAP.  She is accompanied by her mother.  Past Medical History:  Diagnosis Date   Anxiety    Carpal tunnel syndrome    bi lat hands   Chronic leg pain    right   Chronic pain of left knee    Depression    Diverticulitis    GERD (gastroesophageal reflux disease)    Heart murmur    History of kidney stones    Hypertension    Hyperthyroidism    Sleep apnea     no C- Pap   Past Surgical History:  Procedure Laterality Date   ACNE CYST REMOVAL     CHOLECYSTECTOMY     DILATION AND CURETTAGE OF UTERUS  1998   ESOPHAGOGASTRODUODENOSCOPY (EGD) WITH PROPOFOL N/A 06/13/2019   Procedure: ESOPHAGOGASTRODUODENOSCOPY (EGD) WITH PROPOFOL;  Surgeon: Rogene Houston, MD;  Location: [facility];  Service: Endoscopy;  Laterality: N/A;  7:30   KNEE SURGERY     MALONEY DILATION  06/13/2019   Procedure: MALONEY DILATION;  Surgeon: Rogene Houston, MD;  Location: [facility];   Service: Endoscopy;;   MASS EXCISION     THORACOTOMY Right 2016   THYROIDECTOMY N/A 07/01/2020   Procedure: TOTAL THYROIDECTOMY WITH Central compartment lymph node disection;  Surgeon: Armandina Gemma, MD;  Location: [facility];  Service: General;  Laterality: N/A;   Family History  Problem Relation Age of Onset   Diabetes Mother    Hypertension Mother    Breast cancer Mother 37       dx again at 24; PALB2 and CHEK2 positive   Diabetes Father    Hypertension Father    Coronary artery disease Father    Hypertension Sister    Breast cancer Sister        diagnosed in her 37's   Thyroid cancer Maternal Uncle 46       medullary   Lung cancer Maternal Grandmother    Leukemia Maternal Grandfather 40       dx with hairy cell leukemia at 66; CLL at 26   Lymphoma Maternal Grandfather 28       hodgkins lymphoma   Brain cancer Paternal Grandmother    Prostate cancer Paternal Grandfather    Lung cancer Maternal Aunt 57   Lymphoma Maternal Uncle 60       follicular lymphoma  Allergic rhinitis Neg Hx    Asthma Neg Hx     Social History   Tobacco Use   Smoking status: Former    Packs/day: 0.50    Years: 5.00    Pack years: 2.50    Types: Cigarettes    Quit date: 09/15/2017    Years since quitting: 3.5   Smokeless tobacco: Never  Substance Use Topics   Alcohol use: No   Marital Status: Married  ROS  Review of Systems  Cardiovascular:  Positive for chest pain and dyspnea on exertion. Negative for leg swelling.  Respiratory:  Positive for snoring.   Gastrointestinal:  Positive for heartburn. Negative for melena.  Objective  Blood pressure (!) 173/83, pulse 88, temperature 98.1 F (36.7 C), temperature source Temporal, resp. rate 17, height 5' 2"  (1.575 m), weight 298 lb (135.2 kg), last menstrual period 05/22/2015, SpO2 96 %. Body mass index is 54.5 kg/m.  Vitals with BMI 03/15/2021 03/15/2021 03/04/2021  Height - 5' 2"  -  Weight - 298 lbs -  BMI - 50.38 -  Systolic 882 76 800   Diastolic 83 52 94  Pulse 88 83 -     Physical Exam Constitutional:      Comments: Morbidly obese in no acute distress.  Cardiovascular:     Rate and Rhythm: Normal rate and regular rhythm.     Pulses:          Carotid pulses are 2+ on the right side and 2+ on the left side.      Dorsalis pedis pulses are 2+ on the right side and 2+ on the left side.       Posterior tibial pulses are 2+ on the right side and 2+ on the left side.     Heart sounds: Normal heart sounds. No murmur heard.   No gallop.     Comments: Femoral and popliteal pulse difficult to feel due to patient's body habitus.  No leg edema. JVD difficult to see due to short neck. Pulmonary:     Effort: Pulmonary effort is normal.     Breath sounds: Normal breath sounds.  Abdominal:     General: Bowel sounds are normal.     Palpations: Abdomen is soft.     Comments: Obese. Pannus present  Skin:    General: Skin is warm.     Capillary Refill: Capillary refill takes less than 2 seconds.  Neurological:     General: No focal deficit present.     Mental Status: She is alert.     Laboratory examination:   Recent Labs    07/02/20 0511 07/26/20 1537 12/21/20 0601 01/12/21 1547  NA 138 137 140 142  K 4.4 3.6 2.8* 4.1  CL 100 100 103 99  CO2 28 28 28 25   GLUCOSE 115* 101* 114* 83  BUN 14 13 12 13   CREATININE 0.64 0.70 0.77 0.70  CALCIUM 9.4 9.3 8.9 9.9  GFRNONAA >60 >60 >60  --    CrCl cannot be calculated (Patient's most recent lab result is older than the maximum 21 days allowed.).  CMP Latest Ref Rng & Units 01/12/2021 12/21/2020 07/26/2020  Glucose 65 - 99 mg/dL 83 114(H) 101(H)  BUN 6 - 24 mg/dL 13 12 13   Creatinine 0.57 - 1.00 mg/dL 0.70 0.77 0.70  Sodium 134 - 144 mmol/L 142 140 137  Potassium 3.5 - 5.2 mmol/L 4.1 2.8(L) 3.6  Chloride 96 - 106 mmol/L 99 103 100  CO2 20 - 29 mmol/L 25  28 28  Calcium 8.7 - 10.2 mg/dL 9.9 8.9 9.3  Total Protein 6.5 - 8.1 g/dL - 7.3 -  Total Bilirubin 0.3 - 1.2 mg/dL - 0.6  -  Alkaline Phos 38 - 126 U/L - 92 -  AST 15 - 41 U/L - 61(H) -  ALT 0 - 44 U/L - 173(H) -   CBC Latest Ref Rng & Units 12/21/2020 07/26/2020 06/23/2020  WBC 4.0 - 10.5 K/uL 7.7 7.1 7.9  Hemoglobin 12.0 - 15.0 g/dL 14.4 13.3 13.6  Hematocrit 36.0 - 46.0 % 43.3 41.9 42.2  Platelets 150 - 400 K/uL 305 378 325    TSH Recent Labs    05/03/20 1352 10/22/20 1422 01/12/21 1547  TSH 0.01* 31.600* 7.320*    External labs:   Labs 02/02/2021:   Total cholesterol 192, triglycerides 68, HDL 70, LDL 109.  [place] 122  A1c 5.5%.  TSH mildly elevated at 6.880, free T4 normal.  Vitamin D 29.1, mildly reduced.  Medications and allergies   Allergies  Allergen Reactions   Penicillins Rash    Has patient had a PCN reaction causing immediate rash, facial/tongue/throat swelling, SOB or lightheadedness with hypotension: {Yes Has patient had a PCN reaction causing severe rash involving mucus membranes or skin necrosis:unknown Has patient had a PCN reaction that required hospitalizationNo Has patient had a PCN reaction occurring within the last 10 years:No If all of the above answers are "NO", then may proceed with Cephalosporin use.     Medication prior to this encounter:   Outpatient Medications Prior to Visit  Medication Sig Dispense Refill   atenolol (TENORMIN) 50 MG tablet Take 1 tablet (50 mg total) by mouth daily. (Patient taking differently: Take 50 mg by mouth at bedtime.)     Cholecalciferol (VITAMIN D3) 50 MCG (2000 UT) TABS Take 2,000 Units by mouth daily.     desvenlafaxine (PRISTIQ) 50 MG 24 hr tablet Take 50 mg by mouth daily.     doxepin (SINEQUAN) 25 MG capsule Take 2 capsules (50 mg total) by mouth at bedtime for 30 doses. 60 capsule 5   EPINEPHrine 0.3 mg/0.3 mL IJ SOAJ injection Inject 0.3 mg into the muscle as needed for anaphylaxis. 2 each 1   gabapentin (NEURONTIN) 300 MG capsule Take 1 capsule (300 mg total) by mouth 3 (three) times daily. (Patient taking differently: Take  300 mg by mouth at bedtime.) 90 capsule 5   hydrochlorothiazide (HYDRODIURIL) 25 MG tablet Take 25 mg by mouth daily.      HYDROcodone-acetaminophen (NORCO/VICODIN) 5-325 MG tablet Take 1 tablet by mouth every 6 (six) hours as needed for moderate pain.     levocetirizine (XYZAL) 5 MG tablet Take 1 tablet (5 mg total) by mouth in the morning and at bedtime. 60 tablet 5   levothyroxine (SYNTHROID) 200 MCG tablet Take 1 tablet (200 mcg total) by mouth daily. 90 tablet 3   LORazepam (ATIVAN) 1 MG tablet Take 1 mg by mouth at bedtime.      losartan (COZAAR) 100 MG tablet Take 100 mg by mouth daily.      meloxicam (MOBIC) 15 MG tablet Take 15 mg by mouth daily.     methocarbamol (ROBAXIN-750) 750 MG tablet Take 1 tablet (750 mg total) by mouth at bedtime.     Multiple Vitamin (MULTIVITAMIN) tablet Take 2 tablets by mouth daily. gummy     ondansetron (ZOFRAN ODT) 4 MG disintegrating tablet Take 1 tablet (4 mg total) by mouth every 8 (eight) hours as  needed for nausea or vomiting. 10 tablet 0   oxybutynin (DITROPAN) 5 MG tablet Take 5 mg by mouth 2 (two) times daily.     pantoprazole (PROTONIX) 40 MG tablet Take 40 mg by mouth 2 (two) times daily.     rosuvastatin (CRESTOR) 10 MG tablet Take 10 mg by mouth daily.     venlafaxine XR (EFFEXOR-XR) 75 MG 24 hr capsule Take 225 mg by mouth daily.     No facility-administered medications prior to visit.    FINAL MEDICATION AS OF TODAY:   Medications after current encounter Current Outpatient Medications  Medication Instructions   amLODipine (NORVASC) 5 mg, Oral, Daily   atenolol (TENORMIN) 50 mg, Oral, Daily   desvenlafaxine (PRISTIQ) 50 mg, Oral, Daily   doxepin (SINEQUAN) 50 mg, Oral, Daily at bedtime   EPINEPHrine (EPI-PEN) 0.3 mg, Intramuscular, As needed   gabapentin (NEURONTIN) 300 mg, Oral, 3 times daily   hydrochlorothiazide (HYDRODIURIL) 25 mg, Oral, Daily   HYDROcodone-acetaminophen (NORCO/VICODIN) 5-325 MG tablet 1 tablet, Oral, Every 6  hours PRN   levocetirizine (XYZAL) 5 mg, Oral, 2 times daily   levothyroxine (SYNTHROID) 200 mcg, Oral, Daily   LORazepam (ATIVAN) 1 mg, Oral, Daily at bedtime   losartan (COZAAR) 100 mg, Oral, Daily   meloxicam (MOBIC) 15 mg, Oral, Daily   methocarbamol (ROBAXIN-750) 750 mg, Oral, Daily at bedtime   Multiple Vitamin (MULTIVITAMIN) tablet 2 tablets, Oral, Daily, gummy   ondansetron (ZOFRAN ODT) 4 mg, Oral, Every 8 hours PRN   oxybutynin (DITROPAN) 5 mg, Oral, 2 times daily   pantoprazole (PROTONIX) 40 mg, Oral, 2 times daily,     rosuvastatin (CRESTOR) 10 mg, Oral, Daily   Vitamin D3 2,000 Units, Oral, Daily    Radiology:   No results found.  Cardiac Studies:   None EKG:    EKG 03/15/2021: Normal sinus rhythm at rate of 87 bpm, normal axis, cannot exclude inferior infarct old.  No evidence of ischemia.    Assessment     ICD-10-CM   1. Atypical chest pain  R07.89 EKG 12-Lead    PCV ECHOCARDIOGRAM COMPLETE    PCV MYOCARDIAL PERFUSION WO LEXISCAN    CT CARDIAC SCORING ([facility] LOCATIONS ONLY)    2. Dyspnea on exertion  R06.00 PCV ECHOCARDIOGRAM COMPLETE    PCV MYOCARDIAL PERFUSION WO LEXISCAN    3. Hypercholesteremia  E78.00 CT CARDIAC SCORING ([facility] LOCATIONS ONLY)    4. Primary hypertension  I10 amLODipine (NORVASC) 5 MG tablet    5. Class 3 severe obesity due to excess calories without serious comorbidity with body mass index (BMI) of 50.0 to 59.9 in adult (HCC)  E66.01    Z68.43     6. OSA on CPAP  G47.33    Z99.89       Medications Discontinued During This Encounter  Medication Reason   venlafaxine XR (EFFEXOR-XR) 75 MG 24 hr capsule Error    Meds ordered this encounter  Medications   amLODipine (NORVASC) 5 MG tablet    Sig: Take 1 tablet (5 mg total) by mouth daily.    Dispense:  30 tablet    Refill:  2   Orders Placed This Encounter  Procedures   CT CARDIAC SCORING ([facility] LOCATIONS ONLY)    298 / no spinal stimulator, body injector or glucose monitor / no  needs / self pay, $99 @ tos - pt aware Epic order/ miriam w pt No to COVID Pt aware of $75 no-show fee. 03/16/2021 pt aware no caffeine  24 hours prior, no heavy or strenuous exercise 6 hours prior/ miriam    Standing Status:   Future    Standing Expiration Date:   05/15/2021    Order Specific Question:   Is patient pregnant?    Answer:   No    Order Specific Question:   Preferred imaging location?    Answer:   [facility]   PCV MYOCARDIAL PERFUSION WO LEXISCAN    Standing Status:   Future    Standing Expiration Date:   05/15/2021   EKG 12-Lead   PCV ECHOCARDIOGRAM COMPLETE    Standing Status:   Future    Standing Expiration Date:   03/15/2022   Recommendations:   JANAL HAAK is a 50 y.o. Caucasian female with hypertension, hyperlipidemia, morbid obesity who presents for evaluation and management of chest pain and dyspnea on exertion.  Over the past few months, she has noticed retrosternal pain, occurs while she is at work and has to stop working to get relief.  She has also had these episodes sometimes at rest.  Episodes last a few minutes and spontaneously resolved.  She has chronic shortness of breath but has noticed gradually worsening.  Her chest pain symptoms are atypical for angina, worsening dyspnea could be multifactorial including morbid obesity, uncontrolled hypertension and deconditioning. Schedule for a Nuclear stress test to evaluate for myocardial ischemia. Will schedule for an echocardiogram. Add Amlodipine for hypertension.  Her non-HDL cholesterol is at goal, continue present statins.  I had a lengthy discussion with the patient, her mother at the bedside regarding weight loss, reduction in calories and even consideration for bariatric surgery. Office visit following the work-up/investigations.  With regard to obstructive sleep apnea, I discussed with her that she should consider using a different mask, she is presently using snoring patch to close her mouth during breathing  which is reduced snoring but she does have underlying significant sleep apnea.  Hence she could consider the combination of nasal CPAP mask along with using snoring patches to close her mouth that she is presently using.   Adrian Prows, MD, [facility] 03/17/2021, 9:25 PM Office: [phone]

## 2021-03-22 ENCOUNTER — Other Ambulatory Visit: Payer: Self-pay

## 2021-03-22 ENCOUNTER — Ambulatory Visit: Payer: Commercial Managed Care - PPO

## 2021-03-22 DIAGNOSIS — C73 Malignant neoplasm of thyroid gland: Secondary | ICD-10-CM

## 2021-03-22 DIAGNOSIS — R0600 Dyspnea, unspecified: Secondary | ICD-10-CM

## 2021-03-22 DIAGNOSIS — R0789 Other chest pain: Secondary | ICD-10-CM

## 2021-03-22 DIAGNOSIS — E890 Postprocedural hypothyroidism: Secondary | ICD-10-CM

## 2021-03-22 DIAGNOSIS — R0609 Other forms of dyspnea: Secondary | ICD-10-CM

## 2021-03-22 NOTE — Addendum Note (Signed)
Addended by: Kaylyn Lim I on: 03/22/2021 11:19 AM   Modules accepted: Orders

## 2021-03-23 ENCOUNTER — Encounter: Payer: Self-pay | Admitting: Neurology

## 2021-03-23 ENCOUNTER — Ambulatory Visit (INDEPENDENT_AMBULATORY_CARE_PROVIDER_SITE_OTHER): Payer: Commercial Managed Care - PPO

## 2021-03-23 DIAGNOSIS — J309 Allergic rhinitis, unspecified: Secondary | ICD-10-CM | POA: Diagnosis not present

## 2021-03-24 ENCOUNTER — Telehealth: Payer: Self-pay | Admitting: Orthopedic Surgery

## 2021-03-24 DIAGNOSIS — M1712 Unilateral primary osteoarthritis, left knee: Secondary | ICD-10-CM | POA: Insufficient documentation

## 2021-03-24 DIAGNOSIS — G8929 Other chronic pain: Secondary | ICD-10-CM | POA: Insufficient documentation

## 2021-03-24 DIAGNOSIS — M25562 Pain in left knee: Secondary | ICD-10-CM

## 2021-03-24 NOTE — Telephone Encounter (Signed)
Patient called and left message stating she received the shots from Dr. Aline Brochure but they aren't working and she wants the gel shots.   Please call her back at [phone]

## 2021-03-24 NOTE — Telephone Encounter (Signed)
Called her advised her will check with insurance and let her know Put in the referral for tracking purposes

## 2021-03-25 ENCOUNTER — Other Ambulatory Visit: Payer: Commercial Managed Care - PPO

## 2021-03-25 NOTE — Progress Notes (Signed)
Echocardiogram 03/22/2021: Left ventricle cavity is normal in size and wall thickness. Normal global wall motion. Normal LV systolic function with EF 59%. Normal diastolic filling pattern. Mild tricuspid regurgitation. Estimated pulmonary artery systolic pressure 26 mmHg.

## 2021-03-29 LAB — T4, FREE: Free T4: 1.34 ng/dL (ref 0.82–1.77)

## 2021-03-29 LAB — THYROGLOBULIN LEVEL: Thyroglobulin (TG-RIA): <2 ng/mL

## 2021-03-29 LAB — THYROGLOBULIN ANTIBODY: Thyroglobulin Antibody: <1 IU/mL (ref 0.0–0.9)

## 2021-03-29 LAB — TSH: TSH: 2.15 u[IU]/mL (ref 0.450–4.500)

## 2021-03-31 ENCOUNTER — Telehealth: Payer: Self-pay | Admitting: Radiology

## 2021-03-31 NOTE — Telephone Encounter (Signed)
Ins covers 85% of allowable amount of medication, but deductible must be met first.  Ded is $750 and $0 has been met.  No precert required.     $750 / I called her to advise. She can not afford this now.   She is not a surgical candidate now until she loses weight, told her to continue the Meloxicam and let us know if she changes her mind

## 2021-04-01 ENCOUNTER — Ambulatory Visit
Admission: RE | Admit: 2021-04-01 | Discharge: 2021-04-01 | Disposition: A | Discharge status: Home / Self Care | Payer: No Typology Code available for payment source | Source: Ambulatory Visit | Attending: Cardiology | Admitting: Cardiology

## 2021-04-01 DIAGNOSIS — E7800 Pure hypercholesterolemia, unspecified: Secondary | ICD-10-CM

## 2021-04-01 DIAGNOSIS — R0789 Other chest pain: Secondary | ICD-10-CM

## 2021-04-06 ENCOUNTER — Telehealth: Payer: Self-pay | Admitting: Cardiology

## 2021-04-06 NOTE — Telephone Encounter (Signed)
Pt is calling wanting to speak with Dr. Irven Shelling nurse. States that she spoke with Dr. Einar Gip about some weight loss options at her last appt and now has further questions. Call back number is [phone].

## 2021-04-07 ENCOUNTER — Telehealth: Payer: Self-pay

## 2021-04-07 DIAGNOSIS — R600 Localized edema: Secondary | ICD-10-CM

## 2021-04-07 DIAGNOSIS — I10 Essential (primary) hypertension: Secondary | ICD-10-CM

## 2021-04-07 MED ORDER — CHLORTHALIDONE 25 MG PO TABS: 25.0000 mg | ORAL_TABLET | ORAL | 2 refills | Status: DC

## 2021-04-07 NOTE — Telephone Encounter (Signed)
Chief Complaint  Patient presents with   Advice Only    Weight loss   Leg Swelling     ICD-10-CM   1. Primary hypertension  I10     2. Leg edema  R60.0      Medications Discontinued During This Encounter  Medication Reason   hydrochlorothiazide (HYDRODIURIL) 25 MG tablet Change in therapy   amLODipine (NORVASC) 5 MG tablet Side effect (s)    Meds ordered this encounter  Medications   chlorthalidone (HYGROTON) 25 MG tablet    Sig: Take 1 tablet (25 mg total) by mouth every morning.    Dispense:  30 tablet    Refill:  2    Discontinue amlodipine due to leg edema and also discontinue HCTZ     Adrian Prows, MD, [facility] 04/07/2021, 2:00 PM Office: [phone] Fax: [phone] Pager: [phone]

## 2021-04-07 NOTE — Telephone Encounter (Signed)
Called patient, NA, LMAM

## 2021-04-07 NOTE — Telephone Encounter (Signed)
Pt called she is having Swelling in feet which is getting worse, she also asked about your giving her some weight loss medication  FYI she has stress test Monday

## 2021-04-11 ENCOUNTER — Ambulatory Visit: Payer: Commercial Managed Care - PPO

## 2021-04-11 ENCOUNTER — Other Ambulatory Visit: Payer: Self-pay

## 2021-04-11 DIAGNOSIS — R0600 Dyspnea, unspecified: Secondary | ICD-10-CM

## 2021-04-11 DIAGNOSIS — R0609 Other forms of dyspnea: Secondary | ICD-10-CM

## 2021-04-11 DIAGNOSIS — R0789 Other chest pain: Secondary | ICD-10-CM

## 2021-04-13 NOTE — Progress Notes (Signed)
VIALS MADE. EXP 04-13-22

## 2021-04-14 DIAGNOSIS — E890 Postprocedural hypothyroidism: Secondary | ICD-10-CM | POA: Insufficient documentation

## 2021-04-14 DIAGNOSIS — J3081 Allergic rhinitis due to animal (cat) (dog) hair and dander: Secondary | ICD-10-CM

## 2021-04-14 DIAGNOSIS — Z85850 Personal history of malignant neoplasm of thyroid: Secondary | ICD-10-CM | POA: Insufficient documentation

## 2021-04-14 DIAGNOSIS — Z9089 Acquired absence of other organs: Secondary | ICD-10-CM | POA: Insufficient documentation

## 2021-04-15 ENCOUNTER — Ambulatory Visit (INDEPENDENT_AMBULATORY_CARE_PROVIDER_SITE_OTHER): Payer: Commercial Managed Care - PPO

## 2021-04-15 DIAGNOSIS — J309 Allergic rhinitis, unspecified: Secondary | ICD-10-CM | POA: Diagnosis not present

## 2021-04-18 DIAGNOSIS — J3089 Other allergic rhinitis: Secondary | ICD-10-CM

## 2021-04-21 ENCOUNTER — Inpatient Hospital Stay: Payer: No Typology Code available for payment source | Admitting: Hematology and Oncology

## 2021-04-25 ENCOUNTER — Other Ambulatory Visit: Payer: Self-pay

## 2021-04-25 ENCOUNTER — Ambulatory Visit: Payer: Commercial Managed Care - PPO | Admitting: Cardiology

## 2021-04-25 DIAGNOSIS — R202 Paresthesia of skin: Secondary | ICD-10-CM

## 2021-04-27 ENCOUNTER — Other Ambulatory Visit: Payer: Self-pay

## 2021-04-27 ENCOUNTER — Ambulatory Visit (INDEPENDENT_AMBULATORY_CARE_PROVIDER_SITE_OTHER): Payer: Commercial Managed Care - PPO | Admitting: Neurology

## 2021-04-27 ENCOUNTER — Ambulatory Visit (INDEPENDENT_AMBULATORY_CARE_PROVIDER_SITE_OTHER): Payer: Commercial Managed Care - PPO | Admitting: *Deleted

## 2021-04-27 DIAGNOSIS — J309 Allergic rhinitis, unspecified: Secondary | ICD-10-CM

## 2021-04-27 DIAGNOSIS — G5603 Carpal tunnel syndrome, bilateral upper limbs: Secondary | ICD-10-CM

## 2021-04-27 DIAGNOSIS — R202 Paresthesia of skin: Secondary | ICD-10-CM

## 2021-04-27 NOTE — Procedures (Signed)
[facility]  [place], [address]  [place], [place] [address] Tel: [phone] Fax:  [phone] Test Date:  04/27/2021  Patient: Christine Mason DOB: 1971-01-20 Physician: Narda Amber, DO  Sex: Female Height: '5\' 2"'$  Ref Phys: Hiram Gash, MD  ID#: [id number]   Technician:    Patient Complaints: This is a 50 year old female referred for evaluation of bilateral hand paresthesias.  NCV & EMG Findings: Extensive electrodiagnostic testing of the right upper extremity and additional studies of the left shows:  Bilateral median sensory responses are absent.  Bilateral ulnar sensory responses are within normal limits. Bilateral median motor responses severely prolonged latency (R10.7, L10.1 ms) and on the right the amplitude is reduced (2.9 mV).  Of note, there is evidence of bilateral Martin-Gruber anastomosis as seen by a motor response stimulating at the ulnar-wrist and recording at the abductor pollicis brevis muscle.  Bilateral ulnar motor responses are within normal limits. Chronic motor axonal loss changes are seen affecting bilateral abductor pollicis brevis muscles, without accompanying active denervation.  Impression: Bilateral median neuropathy at or distal to the wrist, consistent with a clinical diagnosis of carpal tunnel syndrome.  Overall, these findings are severe in degree electrically and worse on the right. Incidentally, there is evidence of bilateral Martin-Gruber anastomoses, a normal anatomic variant.   ___________________________ Narda Amber, DO    Nerve Conduction Studies Anti Sensory Summary Table   Stim Site NR Peak (ms) Norm Peak (ms) P-T Amp (V) Norm P-T Amp  Left Median Anti Sensory (2nd Digit)  32C  Wrist NR  <3.6  >15  Right Median Anti Sensory (2nd Digit)  32C  Wrist NR  <3.6  >15  Left Ulnar Anti Sensory (5th Digit)  32C  Wrist    2.6 <3.1 31.3 >10  Right Ulnar Anti Sensory (5th Digit)  32C  Wrist    2.5 <3.1 28.8 >10    Motor Summary Table   Stim Site NR Onset (ms) Norm Onset (ms) O-P Amp (mV) Norm O-P Amp Site1 Site2 Delta-0 (ms) Dist (cm) Vel (m/s) Norm Vel (m/s)  Left Median Motor (Abd Poll Brev)  32C  Wrist    10.1 <4.0 6.2 >6 Elbow Wrist 4.5 26.0 58 >50  Elbow    14.6  5.4  Ulnar-wrist crossover Elbow 10.8 0.0    Ulnar-wrist crossover    3.8  2.2         Right Median Motor (Abd Poll Brev)  32C  Wrist    10.7 <4.0 2.9 >6 Elbow Wrist 5.6 29.0 52 >50  Elbow    16.3  2.9  Ulnar-wrist crossover Elbow 12.7 0.0    Ulnar-wrist crossover    3.6  3.8         Left Ulnar Motor (Abd Dig Minimi)  32C  Wrist    2.0 <3.1 10.2 >7 B Elbow Wrist 3.1 22.0 71 >50  B Elbow    5.1  9.5  A Elbow B Elbow 1.5 10.0 67 >50  A Elbow    6.6  9.4         Right Ulnar Motor (Abd Dig Minimi)  32C  Wrist    1.8 <3.1 9.3 >7 B Elbow Wrist 3.3 23.0 70 >50  B Elbow    5.1  8.9  A Elbow B Elbow 1.5 10.0 67 >50  A Elbow    6.6  8.8          EMG   Side Muscle Ins Act Fibs Psw Fasc  Number Recrt Dur Dur. Amp Amp. Poly Poly. Comment  Right 1stDorInt Nml Nml Nml Nml Nml Nml Nml Nml Nml Nml Nml Nml N/A  Right Abd Poll Brev Nml Nml Nml Nml 2- Rapid Many 1+ Many 1+ Many 1+ N/A  Right PronatorTeres Nml Nml Nml Nml Nml Nml Nml Nml Nml Nml Nml Nml N/A  Right Biceps Nml Nml Nml Nml Nml Nml Nml Nml Nml Nml Nml Nml N/A  Right Triceps Nml Nml Nml Nml Nml Nml Nml Nml Nml Nml Nml Nml N/A  Right Deltoid Nml Nml Nml Nml Nml Nml Nml Nml Nml Nml Nml Nml N/A  Left 1stDorInt Nml Nml Nml Nml Nml Nml Nml Nml Nml Nml Nml Nml N/A  Left Abd Poll Brev Nml Nml Nml Nml 2- Rapid Many 1+ Many 1+ Many 1+ N/A  Left PronatorTeres Nml Nml Nml Nml Nml Nml Nml Nml Nml Nml Nml Nml N/A      Waveforms:

## 2021-04-28 NOTE — Progress Notes (Signed)
 Patient Care Team: Hall, John Z, MD as PCP - General (Internal Medicine)  DIAGNOSIS:    ICD-10-CM   1. 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 with above-mentioned history of CHEK2 mutation who is at high risk for breast cancer. Mammogram on 05/11/20 showed no evidence of malignancy bilaterally. She presents to the clinic today for annual follow-up.  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  She underwent radioactive iodine treatment.  She is here today to discuss surveillance for breast cancer.  Her mother accompanied her.  Most of her family members have had either breast or thyroid cancers.  She is very concerned about her own risk of breast cancer.  She has gained a tremendous amount of weight and is very frustrated about the fact that she is unable to lose it.  ALLERGIES:  is allergic to penicillins.  MEDICATIONS:  Current Outpatient Medications  Medication Sig Dispense Refill   atenolol (TENORMIN) 50 MG tablet Take 1 tablet (50 mg total) by mouth daily. (Patient taking differently: Take 50 mg by mouth at bedtime.)     chlorthalidone (HYGROTON) 25 MG tablet Take 1 tablet (25 mg total) by mouth every morning. 30 tablet 2   Cholecalciferol (VITAMIN D3) 50 MCG (2000 UT) TABS Take 2,000 Units by mouth daily.     desvenlafaxine (PRISTIQ) 50 MG 24 hr tablet Take 50 mg by mouth daily.     doxepin (SINEQUAN) 25 MG capsule Take 2 capsules (50 mg total) by mouth at bedtime for 30 doses. 60 capsule 5   EPINEPHrine 0.3 mg/0.3 mL IJ SOAJ injection Inject 0.3 mg into the muscle as needed for anaphylaxis. 2 each 1   gabapentin (NEURONTIN) 300 MG capsule Take 1 capsule (300 mg total) by mouth 3 (three)  times daily. (Patient taking differently: Take 300 mg by mouth at bedtime.) 90 capsule 5   HYDROcodone-acetaminophen (NORCO/VICODIN) 5-325 MG tablet Take 1 tablet by mouth every 6 (six) hours as needed for moderate pain.     levocetirizine (XYZAL) 5 MG tablet Take 1 tablet (5 mg total) by mouth in the morning and at bedtime. 60 tablet 5   levothyroxine (SYNTHROID) 200 MCG tablet Take 1 tablet (200 mcg total) by mouth daily. 90 tablet 3   LORazepam (ATIVAN) 1 MG tablet Take 1 mg by mouth at bedtime.      losartan (COZAAR) 100 MG tablet Take 100 mg by mouth daily.      meloxicam (MOBIC) 15 MG tablet Take 15 mg by mouth daily.     methocarbamol (ROBAXIN-750) 750 MG tablet Take 1 tablet (750 mg total) by mouth at bedtime.     Multiple Vitamin (MULTIVITAMIN) tablet Take 2 tablets by mouth daily. gummy     ondansetron (ZOFRAN ODT) 4 MG disintegrating tablet Take 1 tablet (4 mg total) by mouth every 8 (eight) hours as needed for nausea or vomiting. 10 tablet 0   oxybutynin (DITROPAN) 5 MG tablet Take 5 mg by mouth 2 (two) times daily.     pantoprazole (PROTONIX) 40 MG tablet Take 40 mg by mouth 2 (two) times daily.     rosuvastatin (CRESTOR) 10 MG tablet Take 10 mg by mouth daily.       No current facility-administered medications for this visit.    PHYSICAL EXAMINATION: ECOG PERFORMANCE STATUS: 2 - Symptomatic, <50% confined to bed  Vitals:   04/29/21 1211  BP: 139/90  Pulse: 89  Resp: 18  Temp: 97.8 F (36.6 C)  SpO2: 97%   Filed Weights   04/29/21 1211  Weight: (!) 311 lb 14.4 oz (141.5 kg)    BREAST: No palpable masses or nodules in either right or left breasts. No palpable axillary supraclavicular or infraclavicular adenopathy no breast tenderness or nipple discharge. (exam performed in the presence of a chaperone)  LABORATORY DATA:  I have reviewed the data as listed CMP Latest Ref Rng & Units 01/12/2021 12/21/2020 07/26/2020  Glucose 65 - 99 mg/dL 83 114(H) 101(H)  BUN 6 - 24 mg/dL  13 12 13  Creatinine 0.57 - 1.00 mg/dL 0.70 0.77 0.70  Sodium 134 - 144 mmol/L 142 140 137  Potassium 3.5 - 5.2 mmol/L 4.1 2.8(L) 3.6  Chloride 96 - 106 mmol/L 99 103 100  CO2 20 - 29 mmol/L 25 28 28  Calcium 8.7 - 10.2 mg/dL 9.9 8.9 9.3  Total Protein 6.5 - 8.1 g/dL - 7.3 -  Total Bilirubin 0.3 - 1.2 mg/dL - 0.6 -  Alkaline Phos 38 - 126 U/L - 92 -  AST 15 - 41 U/L - 61(H) -  ALT 0 - 44 U/L - 173(H) -    Lab Results  Component Value Date   WBC 7.7 12/21/2020   HGB 14.4 12/21/2020   HCT 43.3 12/21/2020   MCV 95.2 12/21/2020   PLT 305 12/21/2020   NEUTROABS 5.5 12/21/2020    ASSESSMENT & PLAN:  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  Lifetime risk of breast cancer in vary from 28% to 38%. Higher with family history of breast cancer.    Breast Cancer Surveillance: 1. Breast exam 04/29/2021: Normal 2. Mammogram 09/09/2019: Left breast dilated ducts measuring 0.5 cm, possibly benign Left breast mammogram 05/11/2020: Stable probably benign left breast mass 0.6 cm 2.  Breast MRI 06/28/2020: Benign.  Breast cancer risk reduction: I recommended that she undergo bilateral mastectomies and I sent a referral to Dr. Wakefield.   Total thyroidectomy 07/01/2020: Papillary thyroid carcinoma, classical variant, 1 cm, involving the lower pole of right lobe and left lobe without extrathyroidal extension, margins negative, negative for LVI, metastatic carcinoma in 2/2 lymph nodes focal extranodal extension is present Status post radioactive iodine therapy  Obesity: Patient wants to try obesity medication to see if she can lose weight.   Return to clinic in 1 year for follow-up      Orders Placed This Encounter  Procedures   MR BREAST BILATERAL W WO CONTRAST INC CAD    Standing Status:    Future    Standing Expiration Date:   04/29/2022    Order Specific Question:   If indicated for the ordered procedure, I authorize the administration of contrast media per Radiology protocol    Answer:   Yes    Order Specific Question:   What is the patient's sedation requirement?    Answer:   No Sedation    Order Specific Question:   Does the patient have a pacemaker or implanted devices?    Answer:   No    Order Specific Question:   Preferred imaging   location?    Answer:   [facility] (table limit-550lbs)    Order Specific Question:   Release to patient    Answer:   Immediate   The patient has a good understanding of the overall plan. she agrees with it. she will call with any problems that may develop before the next visit here.  Total time spent: 30 mins including face to face time and time spent for planning, charting and coordination of care  Vinay K Gudena, MD, MPH 04/29/2021  I, Kirstyn Evans, am acting as scribe for Dr. Vinay Gudena.  I have reviewed the above documentation for accuracy and completeness, and I agree with the above.       

## 2021-04-29 ENCOUNTER — Other Ambulatory Visit: Payer: Self-pay

## 2021-04-29 ENCOUNTER — Inpatient Hospital Stay: Payer: Commercial Managed Care - PPO | Attending: Hematology and Oncology | Admitting: Hematology and Oncology

## 2021-04-29 DIAGNOSIS — Z1501 Genetic susceptibility to malignant neoplasm of breast: Secondary | ICD-10-CM | POA: Diagnosis not present

## 2021-04-29 DIAGNOSIS — Z85850 Personal history of malignant neoplasm of thyroid: Secondary | ICD-10-CM | POA: Diagnosis not present

## 2021-04-29 DIAGNOSIS — Z1509 Genetic susceptibility to other malignant neoplasm: Secondary | ICD-10-CM | POA: Insufficient documentation

## 2021-04-29 DIAGNOSIS — E669 Obesity, unspecified: Secondary | ICD-10-CM | POA: Diagnosis not present

## 2021-04-29 DIAGNOSIS — Z79899 Other long term (current) drug therapy: Secondary | ICD-10-CM | POA: Diagnosis not present

## 2021-04-29 NOTE — Assessment & Plan Note (Addendum)
CHEK 2 p.[id number] variant: Based on NCCN guidelines, this is a pathogenic mutation that has been associated with risk of not only breast cancer but also colon, thyroid and kidney cancers.  Lifetime risk of breast cancer in vary from 28% to 38%. Higher with family history of breast cancer.   Breast Cancer Surveillance: 1. Breast exam8/08/2021: Normal 2. Mammogram12/22/2020: Left breast dilated ducts measuring 0.5 cm, possibly benign Left breast mammogram 05/11/2020: Stable probably benign left breast mass 0.6 cm 2.  Breast MRI 06/28/2020: Benign.  Breast cancer risk reduction: I recommended that she undergo bilateral mastectomies and I sent a referral to Dr. Donne Hazel.  Total thyroidectomy 07/01/2020: Papillary thyroid carcinoma, classical variant, 1 cm, involving the lower pole of right lobe and left lobe without extrathyroidal extension, margins negative, negative for LVI, metastatic carcinoma in 2/2 lymph nodes focal extranodal extension is present Status post radioactive iodine therapy  Obesity: Patient wants to try obesity medication to see if she can lose weight.   Return to clinic in 1 year for follow-up   Return to clinic in 1 year for follow-up

## 2021-04-29 NOTE — Progress Notes (Signed)
error 

## 2021-05-02 ENCOUNTER — Other Ambulatory Visit: Payer: Self-pay | Admitting: *Deleted

## 2021-05-02 DIAGNOSIS — Z1501 Genetic susceptibility to malignant neoplasm of breast: Secondary | ICD-10-CM

## 2021-05-12 ENCOUNTER — Ambulatory Visit: Payer: Commercial Managed Care - PPO | Admitting: Endocrinology

## 2021-05-13 ENCOUNTER — Other Ambulatory Visit: Payer: Self-pay | Admitting: *Deleted

## 2021-05-13 ENCOUNTER — Other Ambulatory Visit (HOSPITAL_COMMUNITY): Payer: Self-pay | Admitting: Hematology and Oncology

## 2021-05-13 DIAGNOSIS — Z1231 Encounter for screening mammogram for malignant neoplasm of breast: Secondary | ICD-10-CM

## 2021-05-15 ENCOUNTER — Other Ambulatory Visit: Payer: Self-pay

## 2021-05-15 ENCOUNTER — Ambulatory Visit
Admission: RE | Admit: 2021-05-15 | Discharge: 2021-05-15 | Disposition: A | Discharge status: Home / Self Care | Payer: Commercial Managed Care - PPO | Source: Ambulatory Visit | Attending: Family Medicine | Admitting: Family Medicine

## 2021-05-15 VITALS — BP 128/90 | HR 88 | Temp 98.3°F | Resp 16

## 2021-05-15 DIAGNOSIS — U071 COVID-19: Secondary | ICD-10-CM

## 2021-05-15 DIAGNOSIS — J22 Unspecified acute lower respiratory infection: Secondary | ICD-10-CM

## 2021-05-15 MED ORDER — AZITHROMYCIN 250 MG PO TABS: ORAL_TABLET | ORAL | 0 refills | Status: DC

## 2021-05-15 MED ORDER — NIRMATRELVIR/RITONAVIR (PAXLOVID)TABLET: 3.0000 | ORAL_TABLET | Freq: Two times a day (BID) | ORAL | 0 refills | Status: AC

## 2021-05-15 MED ORDER — PROMETHAZINE-DM 6.25-15 MG/5ML PO SYRP: 5.0000 mL | ORAL_SOLUTION | Freq: Three times a day (TID) | ORAL | 0 refills | Status: DC | PRN

## 2021-05-15 NOTE — ED Triage Notes (Signed)
Cough, wheezing, sneezing, body aches and headaches.  S/s started on Wednesday. Positive home covid test on Friday.   Would like antivirals.

## 2021-05-15 NOTE — ED Provider Notes (Signed)
[facility]    CSN: [id number] Arrival date & time: 05/15/21  1135      History   Chief Complaint No chief complaint on file.   HPI Christine Mason is a 50 y.o. female.   HPI  Patient presents for evaluation following a positive home COVID-19 test.  Medical history significant for obesity, thyroid cancer, hypertension, and sleep apnea.  Patient tested positive for COVID 19 3 days ago.  Symptoms began about 4-5 days ago.  Current symptoms include dry cough, fever, intermittent headache, otalgia, and congestion.  Past Medical History:  Diagnosis Date   Anxiety    Carpal tunnel syndrome    bi lat hands   Chronic leg pain    right   Chronic pain of left knee    Depression    Diverticulitis    GERD (gastroesophageal reflux disease)    Heart murmur    History of kidney stones    Hypertension    Hyperthyroidism    Sleep apnea     no C- Pap    Patient Active Problem List   Diagnosis Date Noted   Unilateral primary osteoarthritis, left knee 03/24/2021   Chronic pain of left knee 03/24/2021   Hypothyroidism 01/12/2021   Hypokalemia 01/12/2021   Papillary thyroid carcinoma ([place]) 07/14/2020   Neoplasm of uncertain behavior of thyroid gland 06/29/2020   Multiple thyroid nodules 06/29/2020   Oropharyngeal dysphagia 05/06/2019   Dysphagia, pharyngoesophageal phase 05/05/2019   Vasomotor symptoms due to menopause 03/13/2019   Genetic testing 06/03/2015   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    Past Surgical History:  Procedure Laterality Date   ACNE CYST REMOVAL     CHOLECYSTECTOMY     DILATION AND CURETTAGE OF UTERUS  1998   ESOPHAGOGASTRODUODENOSCOPY (EGD) WITH PROPOFOL N/A 06/13/2019   Procedure: ESOPHAGOGASTRODUODENOSCOPY (EGD) WITH PROPOFOL;  Surgeon: Rogene Houston, MD;  Location: [facility];  Service: Endoscopy;  Laterality: N/A;  7:30   KNEE SURGERY     MALONEY DILATION  06/13/2019   Procedure: MALONEY DILATION;   Surgeon: Rogene Houston, MD;  Location: [facility];  Service: Endoscopy;;   MASS EXCISION     THORACOTOMY Right 2016   THYROIDECTOMY N/A 07/01/2020   Procedure: TOTAL THYROIDECTOMY WITH Central compartment lymph node disection;  Surgeon: Armandina Gemma, MD;  Location: [facility];  Service: General;  Laterality: N/A;    OB History     Gravida  1   Para      Term      Preterm      AB  1   Living  0      SAB  1   IAB      Ectopic      Multiple      Live Births               Home Medications    Prior to Admission medications   Medication Sig Start Date End Date Taking? Authorizing Provider  azithromycin (ZITHROMAX) 250 MG tablet Take 2 tabs PO x 1 dose, then 1 tab PO QD x 4 days 05/15/21  Yes Scot Jun, FNP  nirmatrelvir/ritonavir EUA (PAXLOVID) 20 x 150 MG & 10 x 100MG TABS Take 3 tablets by mouth 2 (two) times daily for 5 days. Patient GFR is >60 Take nirmatrelvir (150 mg) two tablets twice daily for 5 days and ritonavir (100 mg) one tablet twice daily for 5 days. 05/15/21 05/20/21 Yes Molli Barrows  S, FNP  promethazine-dextromethorphan (PROMETHAZINE-DM) 6.25-15 MG/5ML syrup Take 5 mLs by mouth 3 (three) times daily as needed for cough. 05/15/21  Yes Scot Jun, FNP  atenolol (TENORMIN) 50 MG tablet Take 1 tablet (50 mg total) by mouth daily. Patient taking differently: Take 50 mg by mouth at bedtime. 04/19/18   Nicholas Lose, MD  chlorthalidone (HYGROTON) 25 MG tablet Take 1 tablet (25 mg total) by mouth every morning. 04/07/21 07/06/21  Adrian Prows, MD  Cholecalciferol (VITAMIN D3) 50 MCG (2000 UT) TABS Take 2,000 Units by mouth daily.    [provider]  desvenlafaxine (PRISTIQ) 50 MG 24 hr tablet Take 50 mg by mouth daily. 03/10/21   [provider]  doxepin (SINEQUAN) 25 MG capsule Take 2 capsules (50 mg total) by mouth at bedtime for 30 doses. 02/02/21 03/15/21  Valentina Shaggy, MD  EPINEPHrine 0.3 mg/0.3 mL IJ SOAJ injection  Inject 0.3 mg into the muscle as needed for anaphylaxis. 02/02/21   Valentina Shaggy, MD  gabapentin (NEURONTIN) 300 MG capsule Take 1 capsule (300 mg total) by mouth 3 (three) times daily. Patient taking differently: Take 300 mg by mouth at bedtime. 06/23/20   Carole Civil, MD  HYDROcodone-acetaminophen (NORCO/VICODIN) 5-325 MG tablet Take 1 tablet by mouth every 6 (six) hours as needed for moderate pain. 12/11/20   [provider]  levocetirizine (XYZAL) 5 MG tablet Take 1 tablet (5 mg total) by mouth in the morning and at bedtime. 02/02/21 03/15/21  Valentina Shaggy, MD  levothyroxine (SYNTHROID) 200 MCG tablet Take 1 tablet (200 mcg total) by mouth daily. 02/17/21   Renato Shin, MD  LORazepam (ATIVAN) 1 MG tablet Take 1 mg by mouth at bedtime.  04/02/20   [provider]  losartan (COZAAR) 100 MG tablet Take 100 mg by mouth daily.  12/09/18   [provider]  meloxicam (MOBIC) 15 MG tablet Take 15 mg by mouth daily. 04/27/20   [provider]  methocarbamol (ROBAXIN-750) 750 MG tablet Take 1 tablet (750 mg total) by mouth at bedtime. 04/20/17   Nicholas Lose, MD  Multiple Vitamin (MULTIVITAMIN) tablet Take 2 tablets by mouth daily. gummy    [provider]  ondansetron (ZOFRAN ODT) 4 MG disintegrating tablet Take 1 tablet (4 mg total) by mouth every 8 (eight) hours as needed for nausea or vomiting. 12/21/20   Noemi Chapel, MD  oxybutynin (DITROPAN) 5 MG tablet Take 5 mg by mouth 2 (two) times daily. 04/27/20   [provider]  pantoprazole (PROTONIX) 40 MG tablet Take 40 mg by mouth 2 (two) times daily.    [provider]  rosuvastatin (CRESTOR) 10 MG tablet Take 10 mg by mouth daily. 11/09/20   [provider]    Family History Family History  Problem Relation Age of Onset   Diabetes Mother    Hypertension Mother    Breast cancer Mother 21       dx again at 64; PALB2 and CHEK2 positive   Diabetes Father     Hypertension Father    Coronary artery disease Father    Hypertension Sister    Breast cancer Sister        diagnosed in her 72's   Thyroid cancer Maternal Uncle 46       medullary   Lung cancer Maternal Grandmother    Leukemia Maternal Grandfather 28       dx with hairy cell leukemia at 7; CLL at 10   Lymphoma  Maternal Grandfather 82       hodgkins lymphoma   Brain cancer Paternal Grandmother    Prostate cancer Paternal Grandfather    Lung cancer Maternal Aunt 44   Lymphoma Maternal Uncle 60       follicular lymphoma   Allergic rhinitis Neg Hx    Asthma Neg Hx     Social History Social History   Tobacco Use   Smoking status: Former    Packs/day: 0.50    Years: 5.00    Pack years: 2.50    Types: Cigarettes    Quit date: 09/15/2017    Years since quitting: 3.6   Smokeless tobacco: Never  Vaping Use   Vaping Use: Never used  Substance Use Topics   Alcohol use: No   Drug use: No     Allergies   Penicillins   Review of Systems Review of Systems Pertinent negatives listed in HPI  Physical Exam Triage Vital Signs ED Triage Vitals  Enc Vitals Group     BP 05/15/21 1203 128/90     Pulse Rate 05/15/21 1203 88     Resp 05/15/21 1203 16     Temp 05/15/21 1203 98.3 F (36.8 C)     Temp Source 05/15/21 1203 Tympanic     SpO2 05/15/21 1203 94 %     Weight --      Height --      Head Circumference --      Peak Flow --      Pain Score 05/15/21 1209 5     Pain Loc --      Pain Edu? --      Excl. in [place]? --    No data found.  Updated Vital Signs BP 128/90 (BP Location: Right Arm)   Pulse 88   Temp 98.3 F (36.8 C) (Tympanic)   Resp 16   LMP 05/22/2015   SpO2 94%   Visual Acuity Right Eye Distance:   Left Eye Distance:   Bilateral Distance:    Right Eye Near:   Left Eye Near:    Bilateral Near:     Physical Exam  General Appearance:    Alert, cooperative, no distress  HENT:   Normocephalic, ears normal, nares mucosal edema with congestion,  rhinorrhea, oropharynx    Eyes:    PERRL, conjunctiva/corneas clear, EOM's intact       Lungs:     Clear to auscultation bilaterally, respirations unlabored  Heart:    Regular rate and rhythm  Neurologic:   Awake, alert, oriented x 3. No apparent focal neurological           defect.      UC Treatments / Results  Labs (all labs ordered are listed, but only abnormal results are displayed) Labs Reviewed - No data to display  EKG   Radiology No results found.  Procedures Procedures (including critical care time)  Medications Ordered in UC Medications - No data to display  Initial Impression / Assessment and Plan / UC Course  I have reviewed the triage vital signs and the nursing notes.  Pertinent labs & imaging results that were available during my care of the patient were reviewed by me and considered in my medical decision making (see chart for details).     COVID-19 infection with secondary acute bronchitis Azithromycin acute lower respiratory infection Antiviral therapy initiated Promethazine DM prescribed as needed for cough ER precautions given if any of her symptoms worsen or become severe. Final Clinical Impressions(s) /  UC Diagnoses   Final diagnoses:  [id number] virus infection   Discharge Instructions   None    ED Prescriptions     Medication Sig Dispense Auth. Provider   nirmatrelvir/ritonavir EUA (PAXLOVID) 20 x 150 MG & 10 x 100MG TABS Take 3 tablets by mouth 2 (two) times daily for 5 days. Patient GFR is >60 Take nirmatrelvir (150 mg) two tablets twice daily for 5 days and ritonavir (100 mg) one tablet twice daily for 5 days. 30 tablet Scot Jun, FNP   promethazine-dextromethorphan (PROMETHAZINE-DM) 6.25-15 MG/5ML syrup Take 5 mLs by mouth 3 (three) times daily as needed for cough. 140 mL Scot Jun, FNP   azithromycin (ZITHROMAX) 250 MG tablet Take 2 tabs PO x 1 dose, then 1 tab PO QD x 4 days 6 tablet Scot Jun, FNP      PDMP  not reviewed this encounter.   Scot Jun, FNP 05/15/21 [id number]

## 2021-05-16 ENCOUNTER — Ambulatory Visit: Payer: Commercial Managed Care - PPO | Admitting: Cardiology

## 2021-05-18 ENCOUNTER — Ambulatory Visit: Payer: Self-pay

## 2021-05-18 NOTE — Progress Notes (Signed)
Patient came into the office to receive her allergy injections however was unable to administer them due to her being positive for Covid. She stated that she is on the medication for it and tested herself yesterday and was still positive. I inform the patient that unfortunately I am unable to give her allergy injections and she would need to come back once she test negative. Patient verbalized understanding.

## 2021-05-31 ENCOUNTER — Other Ambulatory Visit (HOSPITAL_COMMUNITY): Payer: Self-pay | Admitting: Hematology and Oncology

## 2021-05-31 DIAGNOSIS — R928 Other abnormal and inconclusive findings on diagnostic imaging of breast: Secondary | ICD-10-CM

## 2021-06-09 ENCOUNTER — Other Ambulatory Visit: Payer: Self-pay

## 2021-06-09 ENCOUNTER — Encounter: Payer: Self-pay | Admitting: Cardiology

## 2021-06-09 ENCOUNTER — Ambulatory Visit: Payer: Commercial Managed Care - PPO | Admitting: Cardiology

## 2021-06-09 VITALS — BP 117/75 | HR 65 | Temp 98.4°F | Resp 17 | Ht 62.0 in | Wt 314.4 lb

## 2021-06-09 DIAGNOSIS — R0600 Dyspnea, unspecified: Secondary | ICD-10-CM

## 2021-06-09 DIAGNOSIS — I10 Essential (primary) hypertension: Secondary | ICD-10-CM

## 2021-06-09 DIAGNOSIS — R0789 Other chest pain: Secondary | ICD-10-CM

## 2021-06-09 DIAGNOSIS — R0609 Other forms of dyspnea: Secondary | ICD-10-CM

## 2021-06-09 MED ORDER — SAXENDA 18 MG/3ML ~~LOC~~ SOPN: PEN_INJECTOR | SUBCUTANEOUS | 0 refills | Status: AC

## 2021-06-09 MED ORDER — NOVOFINE PLUS PEN NEEDLE 32G X 4 MM MISC: 1.0000 "application " | Freq: Every day | 1 refills | Status: DC

## 2021-06-09 MED ORDER — SAXENDA 18 MG/3ML ~~LOC~~ SOPN: 3.0000 mg | PEN_INJECTOR | Freq: Every day | SUBCUTANEOUS | 2 refills | Status: DC

## 2021-06-09 NOTE — Progress Notes (Signed)
Primary Physician/Referring:  Celene Squibb, MD  Patient ID: Christine Mason, female    DOB: 04/27/1971, 50 y.o.   MRN: [id number]  Chief Complaint  Patient presents with   Follow-up   Results   HPI:    Christine Mason  is a 50 y.o. Caucasian female with hypertension, hyperlipidemia, morbid obesity who presents for evaluation and management of chest pain and dyspnea on exertion.  Over the past few months, she has noticed retrosternal pain, occurs while she is at work and has to stop working to get relief.  She has also had these episodes sometimes at rest.  Episodes last a few minutes and spontaneously resolved.  She was seen by me 4 weeks ago.  States that since last office visit she has not had any further chest discomfort.  I had also started her on amlodipine which she did not tolerate due to leg edema and I switched her to chlorthalidone which she is tolerating.  States that her blood pressure is now well controlled.   Past Medical History:  Diagnosis Date   Anxiety    Carpal tunnel syndrome    bi lat hands   Chronic leg pain    right   Chronic pain of left knee    Depression    Diverticulitis    GERD (gastroesophageal reflux disease)    Heart murmur    History of kidney stones    Hypertension    Hyperthyroidism    Sleep apnea     no C- Pap   Past Surgical History:  Procedure Laterality Date   ACNE CYST REMOVAL     CHOLECYSTECTOMY     DILATION AND CURETTAGE OF UTERUS  1998   ESOPHAGOGASTRODUODENOSCOPY (EGD) WITH PROPOFOL N/A 06/13/2019   Procedure: ESOPHAGOGASTRODUODENOSCOPY (EGD) WITH PROPOFOL;  Surgeon: Rogene Houston, MD;  [facility];  Service: Endoscopy;  Laterality: N/A;  7:30   KNEE SURGERY     MALONEY DILATION  06/13/2019   Procedure: MALONEY DILATION;  Surgeon: Rogene Houston, MD;  [facility]: [facility];  Service: Endoscopy;;   MASS EXCISION     THORACOTOMY Right 2016   THYROIDECTOMY N/A 07/01/2020   Procedure: TOTAL THYROIDECTOMY WITH  Central compartment lymph node disection;  Surgeon: Armandina Gemma, MD;  Location: [facility];  Service: General;  Laterality: N/A;   Family History  Problem Relation Age of Onset   Diabetes Mother    Hypertension Mother    Breast cancer Mother 25       dx again at 81; PALB2 and CHEK2 positive   Diabetes Father    Hypertension Father    Coronary artery disease Father    Hypertension Sister    Breast cancer Sister        diagnosed in her 24's   Thyroid cancer Maternal Uncle 46       medullary   Lung cancer Maternal Grandmother    Leukemia Maternal Grandfather 17       dx with hairy cell leukemia at 86; CLL at 28   Lymphoma Maternal Grandfather 82       hodgkins lymphoma   Brain cancer Paternal Grandmother    Prostate cancer Paternal Grandfather    Lung cancer Maternal Aunt 57   Lymphoma Maternal Uncle 60       follicular lymphoma   Allergic rhinitis Neg Hx    Asthma Neg Hx     Social History   Tobacco Use   Smoking status: Former  Packs/day: 0.50    Years: 5.00    Pack years: 2.50    Types: Cigarettes    Quit date: 09/15/2017    Years since quitting: 3.7   Smokeless tobacco: Never  Substance Use Topics   Alcohol use: No   Marital Status: Married  ROS  Review of Systems  Cardiovascular:  Positive for chest pain and dyspnea on exertion. Negative for leg swelling.  Respiratory:  Positive for snoring.   Gastrointestinal:  Positive for heartburn. Negative for melena.  Objective  Blood pressure 117/75, pulse 65, temperature 98.4 F (36.9 C), temperature source Temporal, resp. rate 17, height 5' 2"  (1.575 m), weight (!) 314 lb 6.4 oz (142.6 kg), last menstrual period 05/22/2015. Body mass index is 57.5 kg/m.  Vitals with BMI 06/09/2021 05/15/2021 04/29/2021  Height 5' 2"  - 5' 2"   Weight 314 lbs 6 oz - 311 lbs 14 oz  BMI 94.07 - 68.08  Systolic 811 031 594  Diastolic 75 90 90  Pulse 65 88 89     Physical Exam Constitutional:      Comments: Morbidly obese in no acute  distress.  Cardiovascular:     Rate and Rhythm: Normal rate and regular rhythm.     Pulses:          Carotid pulses are 2+ on the right side and 2+ on the left side.      Dorsalis pedis pulses are 2+ on the right side and 2+ on the left side.       Posterior tibial pulses are 2+ on the right side and 2+ on the left side.     Heart sounds: Normal heart sounds. No murmur heard.   No gallop.     Comments: Femoral and popliteal pulse difficult to feel due to patient's body habitus.  No leg edema. JVD difficult to see due to short neck. Pulmonary:     Effort: Pulmonary effort is normal.     Breath sounds: Normal breath sounds.  Abdominal:     General: Bowel sounds are normal.     Palpations: Abdomen is soft.     Comments: Obese. Pannus present  Skin:    General: Skin is warm.     Capillary Refill: Capillary refill takes less than 2 seconds.  Neurological:     General: No focal deficit present.     Mental Status: She is alert.     Laboratory examination:   Recent Labs    07/02/20 0511 07/26/20 1537 12/21/20 0601 01/12/21 1547  NA 138 137 140 142  K 4.4 3.6 2.8* 4.1  CL 100 100 103 99  CO2 28 28 28 25   GLUCOSE 115* 101* 114* 83  BUN 14 13 12 13   CREATININE 0.64 0.70 0.77 0.70  CALCIUM 9.4 9.3 8.9 9.9  GFRNONAA >60 >60 >60  --    CrCl cannot be calculated (Patient's most recent lab result is older than the maximum 21 days allowed.).  CMP Latest Ref Rng & Units 01/12/2021 12/21/2020 07/26/2020  Glucose 65 - 99 mg/dL 83 114(H) 101(H)  BUN 6 - 24 mg/dL 13 12 13   Creatinine 0.57 - 1.00 mg/dL 0.70 0.77 0.70  Sodium 134 - 144 mmol/L 142 140 137  Potassium 3.5 - 5.2 mmol/L 4.1 2.8(L) 3.6  Chloride 96 - 106 mmol/L 99 103 100  CO2 20 - 29 mmol/L 25 28 28   Calcium 8.7 - 10.2 mg/dL 9.9 8.9 9.3  Total Protein 6.5 - 8.1 g/dL - 7.3 -  Total  Bilirubin 0.3 - 1.2 mg/dL - 0.6 -  Alkaline Phos 38 - 126 U/L - 92 -  AST 15 - 41 U/L - 61(H) -  ALT 0 - 44 U/L - 173(H) -   CBC Latest Ref Rng  & Units 12/21/2020 07/26/2020 06/23/2020  WBC 4.0 - 10.5 K/uL 7.7 7.1 7.9  Hemoglobin 12.0 - 15.0 g/dL 14.4 13.3 13.6  Hematocrit 36.0 - 46.0 % 43.3 41.9 42.2  Platelets 150 - 400 K/uL 305 378 325    TSH Recent Labs    10/22/20 1422 01/12/21 1547 03/23/21 1225  TSH 31.600* 7.320* 2.150    External labs:   Labs 02/02/2021:   Total cholesterol 192, triglycerides 68, HDL 70, LDL 109.  [place] 122  A1c 5.5%.  TSH mildly elevated at 6.880, free T4 normal.  Vitamin D 29.1, mildly reduced.  Medications and allergies   Allergies  Allergen Reactions   Penicillins Rash    Has patient had a PCN reaction causing immediate rash, facial/tongue/throat swelling, SOB or lightheadedness with hypotension: {Yes Has patient had a PCN reaction causing severe rash involving mucus membranes or skin necrosis:unknown Has patient had a PCN reaction that required hospitalizationNo Has patient had a PCN reaction occurring within the last 10 years:No If all of the above answers are "NO", then may proceed with Cephalosporin use.     Medication prior to this encounter:   Outpatient Medications Prior to Visit  Medication Sig Dispense Refill   atenolol (TENORMIN) 50 MG tablet Take 1 tablet (50 mg total) by mouth daily. (Patient taking differently: Take 50 mg by mouth at bedtime.)     azithromycin (ZITHROMAX) 250 MG tablet Take 2 tabs PO x 1 dose, then 1 tab PO QD x 4 days 6 tablet 0   chlorthalidone (HYGROTON) 25 MG tablet Take 1 tablet (25 mg total) by mouth every morning. 30 tablet 2   Cholecalciferol (VITAMIN D3) 50 MCG (2000 UT) TABS Take 2,000 Units by mouth daily.     desvenlafaxine (PRISTIQ) 50 MG 24 hr tablet Take 50 mg by mouth daily.     doxepin (SINEQUAN) 25 MG capsule Take 2 capsules (50 mg total) by mouth at bedtime for 30 doses. 60 capsule 5   EPINEPHrine 0.3 mg/0.3 mL IJ SOAJ injection Inject 0.3 mg into the muscle as needed for anaphylaxis. 2 each 1   gabapentin (NEURONTIN) 300 MG capsule  Take 1 capsule (300 mg total) by mouth 3 (three) times daily. (Patient taking differently: Take 300 mg by mouth at bedtime.) 90 capsule 5   HYDROcodone-acetaminophen (NORCO/VICODIN) 5-325 MG tablet Take 1 tablet by mouth every 6 (six) hours as needed for moderate pain.     levocetirizine (XYZAL) 5 MG tablet Take 1 tablet (5 mg total) by mouth in the morning and at bedtime. 60 tablet 5   levothyroxine (SYNTHROID) 200 MCG tablet Take 1 tablet (200 mcg total) by mouth daily. 90 tablet 3   LORazepam (ATIVAN) 1 MG tablet Take 1 mg by mouth at bedtime.      losartan (COZAAR) 100 MG tablet Take 100 mg by mouth daily.      meloxicam (MOBIC) 15 MG tablet Take 15 mg by mouth daily.     methocarbamol (ROBAXIN-750) 750 MG tablet Take 1 tablet (750 mg total) by mouth at bedtime.     Multiple Vitamin (MULTIVITAMIN) tablet Take 2 tablets by mouth daily. gummy     ondansetron (ZOFRAN ODT) 4 MG disintegrating tablet Take 1 tablet (4 mg total) by  mouth every 8 (eight) hours as needed for nausea or vomiting. 10 tablet 0   oxybutynin (DITROPAN) 5 MG tablet Take 5 mg by mouth 2 (two) times daily.     pantoprazole (PROTONIX) 40 MG tablet Take 40 mg by mouth 2 (two) times daily.     rosuvastatin (CRESTOR) 10 MG tablet Take 10 mg by mouth daily.     zolpidem (AMBIEN) 5 MG tablet Take 5 mg by mouth at bedtime as needed for sleep.     promethazine-dextromethorphan (PROMETHAZINE-DM) 6.25-15 MG/5ML syrup Take 5 mLs by mouth 3 (three) times daily as needed for cough. 140 mL 0   No facility-administered medications prior to visit.    FINAL MEDICATION AS OF TODAY:   Medications after current encounter Current Outpatient Medications  Medication Instructions   atenolol (TENORMIN) 50 mg, Oral, Daily   azithromycin (ZITHROMAX) 250 MG tablet Take 2 tabs PO x 1 dose, then 1 tab PO QD x 4 days   chlorthalidone (HYGROTON) 25 mg, Oral, BH-each morning   desvenlafaxine (PRISTIQ) 50 mg, Oral, Daily   doxepin (SINEQUAN) 50 mg,  Oral, Daily at bedtime   EPINEPHrine (EPI-PEN) 0.3 mg, Intramuscular, As needed   gabapentin (NEURONTIN) 300 mg, Oral, 3 times daily   HYDROcodone-acetaminophen (NORCO/VICODIN) 5-325 MG tablet 1 tablet, Oral, Every 6 hours PRN   Insulin Pen Needle (NOVOFINE PLUS PEN NEEDLE) 32G X 4 MM MISC 1 application, Does not apply, Daily   levocetirizine (XYZAL) 5 mg, Oral, 2 times daily   levothyroxine (SYNTHROID) 200 mcg, Oral, Daily   Liraglutide -Weight Management (SAXENDA) 18 MG/3ML SOPN Inject 0.6 mg into the skin daily for 7 days, THEN 1.2 mg daily for 7 days, THEN 1.8 mg daily for 7 days, THEN 2.4 mg daily for 7 days. Titrate up as tolerated.   LORazepam (ATIVAN) 1 mg, Oral, Daily at bedtime   losartan (COZAAR) 100 mg, Oral, Daily   meloxicam (MOBIC) 15 mg, Oral, Daily   methocarbamol (ROBAXIN-750) 750 mg, Oral, Daily at bedtime   Multiple Vitamin (MULTIVITAMIN) tablet 2 tablets, Oral, Daily, gummy   ondansetron (ZOFRAN ODT) 4 mg, Oral, Every 8 hours PRN   oxybutynin (DITROPAN) 5 mg, Oral, 2 times daily   pantoprazole (PROTONIX) 40 mg, Oral, 2 times daily,     rosuvastatin (CRESTOR) 10 mg, Oral, Daily   Saxenda 3 mg, Subcutaneous, Daily   Vitamin D3 2,000 Units, Oral, Daily   zolpidem (AMBIEN) 5 mg, Oral, At bedtime PRN    Radiology:   No results found.  Cardiac Studies:   PCV ECHOCARDIOGRAM COMPLETE 03/22/2021  Narrative Echocardiogram 03/22/2021: Left ventricle cavity is normal in size and wall thickness. Normal global wall motion. Normal LV systolic function with EF 59%. Normal diastolic filling pattern. Mild tricuspid regurgitation. Estimated pulmonary artery systolic pressure 26 mmHg.    PCV MYOCARDIAL PERFUSION WO LEXISCAN 04/11/2021  Narrative Exercise Sestamibi stress test 04/11/2021: Exercise nuclear stress test was performed using Bruce protocol. Patient reached 6.2 METS, and 88% of age predicted maximum heart rate. Exercise capacity was low. No chest pain reported. Normal  heart rate response. Hypertensive exercise response with peak BP 222/118 mmHg.  Stress EKG revealed no ischemic changes. Decreased tracer uptake in inferior myocardium at rest and stress images likely due to breast attenuation-imaging performed in sitting position. Stress LVEF 74% with no wall motion and thickening abnormality. Low risk study.  Coronary calcium score 04/02/2021: Total Agatston Score: 0. MESA database percentile: 0 Ascending Aorta: 31 mm Descending Aorta: 23 mm. Elevated  right hemidiaphragm stable.   EKG:    EKG 03/15/2021: Normal sinus rhythm at rate of 87 bpm, normal axis, cannot exclude inferior infarct old.  No evidence of ischemia.    Assessment     ICD-10-CM   1. Atypical chest pain  R07.89     2. Dyspnea on exertion  R06.00     3. Class 3 severe obesity due to excess calories without serious comorbidity with body mass index (BMI) of 50.0 to 59.9 in adult (HCC)  E66.01 Liraglutide -Weight Management (SAXENDA) 18 MG/3ML SOPN   Z68.43 Liraglutide -Weight Management (SAXENDA) 18 MG/3ML SOPN    Insulin Pen Needle (NOVOFINE PLUS PEN NEEDLE) 32G X 4 MM MISC    4. Primary hypertension  I10       Medications Discontinued During This Encounter  Medication Reason   promethazine-dextromethorphan (PROMETHAZINE-DM) 6.25-15 MG/5ML syrup Error    Meds ordered this encounter  Medications   Liraglutide -Weight Management (SAXENDA) 18 MG/3ML SOPN    Sig: Inject 0.6 mg into the skin daily for 7 days, THEN 1.2 mg daily for 7 days, THEN 1.8 mg daily for 7 days, THEN 2.4 mg daily for 7 days. Titrate up as tolerated.    Dispense:  6.9 mL    Refill:  0   Liraglutide -Weight Management (SAXENDA) 18 MG/3ML SOPN    Sig: Inject 3 mg into the skin daily.    Dispense:  15 mL    Refill:  2   Insulin Pen Needle (NOVOFINE PLUS PEN NEEDLE) 32G X 4 MM MISC    Sig: 1 application by Does not apply route daily.    Dispense:  100 each    Refill:  1    No orders of the defined types were  placed in this encounter.  Recommendations:   Christine Mason is a 50 y.o. Caucasian female with hypertension, hyperlipidemia, morbid obesity who presents for evaluation and management of chest pain and dyspnea on exertion.  I have reviewed the results of the echocardiogram, nuclear stress test and also coronary calcium score.  Overall low risk.  Advised her that her symptoms are probably related to GERD.  She has not had any further chest pains.  Dyspnea has remained stable, no PND or orthopnea, no blood pressure is well controlled on chlorthalidone.  She could not tolerate amlodipine due to leg edema.  Labs have been stable with regard to renal function and potassium levels.  Continue the same.  We discussed regarding obesity and management of obesity.  I would like to refer to wellness clinic for weight loss.  I will try her on Saxenda, detailed prescription given to the patient, hopefully she will get approved by the insurance.  She can follow-up with her PCP for management of her weight and hypertension going forward, lipids are also well controlled.  I will see her back on a as needed basis.    Adrian Prows, MD, [facility] 06/09/2021, 12:13 PM Office: [phone]

## 2021-06-10 ENCOUNTER — Ambulatory Visit (INDEPENDENT_AMBULATORY_CARE_PROVIDER_SITE_OTHER): Payer: Commercial Managed Care - PPO

## 2021-06-10 DIAGNOSIS — J309 Allergic rhinitis, unspecified: Secondary | ICD-10-CM

## 2021-06-14 ENCOUNTER — Other Ambulatory Visit: Payer: Self-pay

## 2021-06-14 ENCOUNTER — Telehealth: Payer: Self-pay

## 2021-06-14 ENCOUNTER — Ambulatory Visit (HOSPITAL_COMMUNITY)
Admission: RE | Admit: 2021-06-14 | Discharge: 2021-06-14 | Disposition: A | Discharge status: Home / Self Care | Payer: Commercial Managed Care - PPO | Source: Ambulatory Visit | Attending: Hematology and Oncology | Admitting: Hematology and Oncology

## 2021-06-14 ENCOUNTER — Encounter (HOSPITAL_COMMUNITY): Payer: Self-pay

## 2021-06-14 DIAGNOSIS — R928 Other abnormal and inconclusive findings on diagnostic imaging of breast: Secondary | ICD-10-CM

## 2021-06-14 DIAGNOSIS — Z1231 Encounter for screening mammogram for malignant neoplasm of breast: Secondary | ICD-10-CM | POA: Insufficient documentation

## 2021-06-14 DIAGNOSIS — Z6841 Body Mass Index (BMI) 40.0 and over, adult: Secondary | ICD-10-CM

## 2021-06-14 DIAGNOSIS — I10 Essential (primary) hypertension: Secondary | ICD-10-CM

## 2021-06-14 NOTE — Telephone Encounter (Signed)
ICD-10-CM   1. Class 3 severe obesity due to excess calories without serious comorbidity with body mass index (BMI) of 50.0 to 59.9 in adult [facility])  E66.01 Ambulatory referral to [facility]   Z68.43     2. Primary hypertension  I10 Ambulatory referral to Cecil, MD, [facility] 06/14/2021, 2:01 PM Office: [phone] Fax: [phone] Pager: [phone]

## 2021-06-14 NOTE — Telephone Encounter (Signed)
Can you place referral if one is needed?

## 2021-06-14 NOTE — Telephone Encounter (Signed)
Christine Mason has been denied by insurance

## 2021-06-24 ENCOUNTER — Ambulatory Visit (INDEPENDENT_AMBULATORY_CARE_PROVIDER_SITE_OTHER): Payer: Commercial Managed Care - PPO | Admitting: Endocrinology

## 2021-06-24 ENCOUNTER — Other Ambulatory Visit: Payer: Self-pay

## 2021-06-24 VITALS — BP 130/80 | HR 68 | Ht 62.0 in | Wt 314.8 lb

## 2021-06-24 DIAGNOSIS — C73 Malignant neoplasm of thyroid gland: Secondary | ICD-10-CM

## 2021-06-24 LAB — TSH: TSH: 2.15 u[IU]/mL (ref 0.35–5.50)

## 2021-06-24 LAB — T4, FREE: Free T4: 1.09 ng/dL (ref 0.60–1.60)

## 2021-06-24 NOTE — Patient Instructions (Addendum)
Blood tests are requested for you today.  We'll let you know about the results.  Please come back for a follow-up appointment in 6 months.   

## 2021-06-24 NOTE — Progress Notes (Signed)
Subjective:    Patient ID: Christine Mason, female    DOB: 1971/09/14, 50 y.o.   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  10/21 thyroidect: path shows PTC T1N1Mx.   12/21 RAI 98 mCi.   12/21 post-therapy scan: Thyroid remnant.   2/22 TG undet (Ab neg).   7/22 TG undet (Ab neg).  Denies neck pain and swelling.   She takes synthroid as rx'ed.  pt states she feels well in general.   Past Medical History:  Diagnosis Date   Anxiety    Carpal tunnel syndrome    bi lat hands   Chronic leg pain    right   Chronic pain of left knee    Depression    Diverticulitis    GERD (gastroesophageal reflux disease)    Heart murmur    History of kidney stones    Hypertension    Hyperthyroidism    Sleep apnea     no C- Pap    Past Surgical History:  Procedure Laterality Date   ACNE CYST REMOVAL     CHOLECYSTECTOMY     DILATION AND CURETTAGE OF UTERUS  1998   ESOPHAGOGASTRODUODENOSCOPY (EGD) WITH PROPOFOL N/A 06/13/2019   Procedure: ESOPHAGOGASTRODUODENOSCOPY (EGD) WITH PROPOFOL;  Surgeon: Rogene Houston, MD;  [facility];  Service: Endoscopy;  Laterality: N/A;  7:30   KNEE SURGERY     MALONEY DILATION  06/13/2019   Procedure: MALONEY DILATION;  Surgeon: Rogene Houston, MD;  [facility];  Service: Endoscopy;;   MASS EXCISION     THORACOTOMY Right 2016   THYROIDECTOMY N/A 07/01/2020   Procedure: TOTAL THYROIDECTOMY WITH Central compartment lymph node disection;  Surgeon: Armandina Gemma, MD;  Location: [facility];  Service: General;  Laterality: N/A;    Social History   Socioeconomic History   Marital status: Married    Spouse name: Not on file   Number of children: 0   Years of education: Not on file   Highest education level: Not on file  Occupational History    Employer: [employer]  Tobacco  Use   Smoking status: Former    Packs/day: 0.50    Years: 5.00    Pack years: 2.50    Types: Cigarettes    Quit date: 09/15/2017    Years since quitting: 3.7   Smokeless tobacco: Never  Vaping Use   Vaping Use: Never used  Substance and Sexual Activity   Alcohol use: No   Drug use: No   Sexual activity: Not Currently    Birth control/protection: Post-menopausal  Other Topics Concern   Not on file  Social History Narrative   Not on file   Social Determinants of Health   Financial Resource Strain: Not on file  Food Insecurity: Not on file  Transportation Needs: Not on file  Physical Activity: Not on file  Stress: Not on file  Social Connections: Not on file  Intimate Partner Violence: Not on file    Current Outpatient Medications on File Prior to Visit  Medication Sig Dispense Refill   atenolol (TENORMIN) 50 MG tablet Take 1 tablet (50 mg total) by mouth daily. (Patient taking differently: Take 50 mg by mouth at bedtime.)     chlorthalidone (HYGROTON) 25 MG tablet Take 1 tablet (25 mg total)  by mouth every morning. 30 tablet 2   Cholecalciferol (VITAMIN D3) 50 MCG (2000 UT) TABS Take 2,000 Units by mouth daily.     desvenlafaxine (PRISTIQ) 50 MG 24 hr tablet Take 50 mg by mouth daily.     EPINEPHrine 0.3 mg/0.3 mL IJ SOAJ injection Inject 0.3 mg into the muscle as needed for anaphylaxis. 2 each 1   gabapentin (NEURONTIN) 300 MG capsule Take 1 capsule (300 mg total) by mouth 3 (three) times daily. (Patient taking differently: Take 300 mg by mouth at bedtime.) 90 capsule 5   HYDROcodone-acetaminophen (NORCO/VICODIN) 5-325 MG tablet Take 1 tablet by mouth every 6 (six) hours as needed for moderate pain.     levothyroxine (SYNTHROID) 200 MCG tablet Take 1 tablet (200 mcg total) by mouth daily. 90 tablet 3   LORazepam (ATIVAN) 1 MG tablet Take 1 mg by mouth at bedtime.      losartan (COZAAR) 100 MG tablet Take 100 mg by mouth daily.      meloxicam (MOBIC) 15 MG tablet Take 15 mg  by mouth daily.     methocarbamol (ROBAXIN-750) 750 MG tablet Take 1 tablet (750 mg total) by mouth at bedtime.     Multiple Vitamin (MULTIVITAMIN) tablet Take 2 tablets by mouth daily. gummy     ondansetron (ZOFRAN ODT) 4 MG disintegrating tablet Take 1 tablet (4 mg total) by mouth every 8 (eight) hours as needed for nausea or vomiting. 10 tablet 0   oxybutynin (DITROPAN) 5 MG tablet Take 5 mg by mouth 2 (two) times daily.     pantoprazole (PROTONIX) 40 MG tablet Take 40 mg by mouth 2 (two) times daily.     rosuvastatin (CRESTOR) 10 MG tablet Take 10 mg by mouth daily.     zolpidem (AMBIEN) 5 MG tablet Take 5 mg by mouth at bedtime as needed for sleep.     azithromycin (ZITHROMAX) 250 MG tablet Take 2 tabs PO x 1 dose, then 1 tab PO QD x 4 days 6 tablet 0   doxepin (SINEQUAN) 25 MG capsule Take 2 capsules (50 mg total) by mouth at bedtime for 30 doses. 60 capsule 5   Insulin Pen Needle (NOVOFINE PLUS PEN NEEDLE) 32G X 4 MM MISC 1 application by Does not apply route daily. 100 each 1   levocetirizine (XYZAL) 5 MG tablet Take 1 tablet (5 mg total) by mouth in the morning and at bedtime. 60 tablet 5   Liraglutide -Weight Management (SAXENDA) 18 MG/3ML SOPN Inject 0.6 mg into the skin daily for 7 days, THEN 1.2 mg daily for 7 days, THEN 1.8 mg daily for 7 days, THEN 2.4 mg daily for 7 days. Titrate up as tolerated. 6.9 mL 0   Liraglutide -Weight Management (SAXENDA) 18 MG/3ML SOPN Inject 3 mg into the skin daily. 15 mL 2   No current facility-administered medications on file prior to visit.    Allergies  Allergen Reactions   Penicillins Rash    Has patient had a PCN reaction causing immediate rash, facial/tongue/throat swelling, SOB or lightheadedness with hypotension: {Yes Has patient had a PCN reaction causing severe rash involving mucus membranes or skin necrosis:unknown Has patient had a PCN reaction that required hospitalizationNo Has patient had a PCN reaction occurring within the last 10  years:No If all of the above answers are "NO", then may proceed with Cephalosporin use.     Family History  Problem Relation Age of Onset   Diabetes Mother    Hypertension Mother  Breast cancer Mother 82       dx again at 54; PALB2 and CHEK2 positive   Diabetes Father    Hypertension Father    Coronary artery disease Father    Hypertension Sister    Breast cancer Sister        diagnosed in her 60's   Thyroid cancer Maternal Uncle 46       medullary   Lung cancer Maternal Grandmother    Leukemia Maternal Grandfather 75       dx with hairy cell leukemia at 32; CLL at 64   Lymphoma Maternal Grandfather 82       hodgkins lymphoma   Brain cancer Paternal Grandmother    Prostate cancer Paternal Grandfather    Lung cancer Maternal Aunt 57   Lymphoma Maternal Uncle 60       follicular lymphoma   Allergic rhinitis Neg Hx    Asthma Neg Hx     BP 130/80 (BP Location: Right Arm, Patient Position: Sitting, Cuff Size: Large)   Pulse 68   Ht 5' 2"  (1.575 m)   Wt (!) 314 lb 12.8 oz (142.8 kg)   LMP 05/22/2015   SpO2 94%   BMI 57.58 kg/m    Review of Systems     Objective:   Physical Exam VITAL SIGNS:  See vs page GENERAL: no distress Neck: a healed scar is present.  I do not appreciate a nodule in the thyroid or elsewhere in the neck.    Lab Results  Component Value Date   TSH 2.15 06/24/2021       Assessment & Plan:  Hypothyroidism: well-controlled.  Please continue the same synthroid PTC: recheck today  Patient Instructions  Blood tests are requested for you today.  We'll let you know about the results.   Please come back for a follow-up appointment in 6 months.

## 2021-06-26 ENCOUNTER — Other Ambulatory Visit: Payer: Self-pay | Admitting: Cardiology

## 2021-06-26 DIAGNOSIS — I10 Essential (primary) hypertension: Secondary | ICD-10-CM

## 2021-06-27 LAB — THYROGLOBULIN ANTIBODY: Thyroglobulin Ab: <1 IU/mL (ref <=1)

## 2021-06-27 LAB — THYROGLOBULIN LEVEL: Thyroglobulin: 0.8 ng/mL — ABNORMAL LOW

## 2021-07-06 ENCOUNTER — Other Ambulatory Visit (HOSPITAL_COMMUNITY): Payer: Self-pay | Admitting: Family Medicine

## 2021-07-06 ENCOUNTER — Ambulatory Visit (HOSPITAL_COMMUNITY)
Admission: RE | Admit: 2021-07-06 | Discharge: 2021-07-06 | Disposition: A | Discharge status: Home / Self Care | Payer: Commercial Managed Care - PPO | Source: Ambulatory Visit | Attending: Family Medicine | Admitting: Family Medicine

## 2021-07-06 ENCOUNTER — Other Ambulatory Visit: Payer: Self-pay

## 2021-07-06 ENCOUNTER — Ambulatory Visit (INDEPENDENT_AMBULATORY_CARE_PROVIDER_SITE_OTHER): Payer: Commercial Managed Care - PPO

## 2021-07-06 ENCOUNTER — Other Ambulatory Visit: Payer: Self-pay | Admitting: Allergy & Immunology

## 2021-07-06 DIAGNOSIS — R2241 Localized swelling, mass and lump, right lower limb: Secondary | ICD-10-CM | POA: Diagnosis present

## 2021-07-06 DIAGNOSIS — J309 Allergic rhinitis, unspecified: Secondary | ICD-10-CM

## 2021-07-07 ENCOUNTER — Ambulatory Visit
Admission: RE | Admit: 2021-07-07 | Discharge: 2021-07-07 | Disposition: A | Discharge status: Home / Self Care | Payer: No Typology Code available for payment source | Source: Ambulatory Visit | Attending: Hematology and Oncology | Admitting: Hematology and Oncology

## 2021-07-07 DIAGNOSIS — Z1501 Genetic susceptibility to malignant neoplasm of breast: Secondary | ICD-10-CM

## 2021-07-07 MED ORDER — GADOBUTROL 1 MMOL/ML IV SOLN
10.0000 mL | Freq: Once | INTRAVENOUS | Status: AC | PRN
  Administered 2021-07-07: 10 mL via INTRAVENOUS

## 2021-07-14 ENCOUNTER — Telehealth: Payer: Self-pay

## 2021-07-14 NOTE — Telephone Encounter (Signed)
-----   Message from Gardenia Phlegm, NP sent at 07/12/2021  1:43 PM EDT ----- Christine Mason neg, p[lease call patient with results ----- Message ----- From: Interface, Rad Results In Sent: 07/07/2021   2:44 PM EDT To: Nicholas Lose, MD

## 2021-07-14 NOTE — Telephone Encounter (Signed)
Spoke with pt regarding her recent scan per Wilber Bihari, NP. Pt verbalizes understanding and is appreciative of call back

## 2021-07-15 ENCOUNTER — Other Ambulatory Visit: Payer: Self-pay

## 2021-07-15 ENCOUNTER — Ambulatory Visit: Payer: Commercial Managed Care - PPO | Admitting: Podiatry

## 2021-07-15 DIAGNOSIS — M19079 Primary osteoarthritis, unspecified ankle and foot: Secondary | ICD-10-CM

## 2021-07-15 DIAGNOSIS — M67479 Ganglion, unspecified ankle and foot: Secondary | ICD-10-CM

## 2021-07-15 NOTE — Progress Notes (Signed)
Subjective:  Patient ID: Christine Mason, female    DOB: 03-30-1971,  MRN: [id number]  Chief Complaint  Patient presents with   Foot Pain    Pt is concerned about a place on the top of her foot     50 y.o. female presents with the above complaint.  Patient presents with complaint of right dorsal cyst of the midfoot.  Patient states that he has been going on and off on top of her foot.  She denies any pain.  She had an x-ray done on October 19 the primary care doctor.  She wanted to get eval waited further to make sure that there is nothing else going on.  She denies any other acute complaints.   Review of Systems: Negative except as noted in the HPI. Denies N/V/F/Ch.  Past Medical History:  Diagnosis Date   Anxiety    Carpal tunnel syndrome    bi lat hands   Chronic leg pain    right   Chronic pain of left knee    Depression    Diverticulitis    GERD (gastroesophageal reflux disease)    Heart murmur    History of kidney stones    Hypertension    Hyperthyroidism    Sleep apnea     no C- Pap    Current Outpatient Medications:    atenolol (TENORMIN) 50 MG tablet, Take 1 tablet (50 mg total) by mouth daily. (Patient taking differently: Take 50 mg by mouth at bedtime.), Disp: , Rfl:    azithromycin (ZITHROMAX) 250 MG tablet, Take 2 tabs PO x 1 dose, then 1 tab PO QD x 4 days, Disp: 6 tablet, Rfl: 0   chlorthalidone (HYGROTON) 25 MG tablet, TAKE 1 TABLET BY MOUTH ONCE EVERY MONRING., Disp: 30 tablet, Rfl: 0   Cholecalciferol (VITAMIN D3) 50 MCG (2000 UT) TABS, Take 2,000 Units by mouth daily., Disp: , Rfl:    desvenlafaxine (PRISTIQ) 50 MG 24 hr tablet, Take 50 mg by mouth daily., Disp: , Rfl:    doxepin (SINEQUAN) 25 MG capsule, TAKE (2) CAPSULES BY MOUTH ONCE AT BEDTIME., Disp: 60 capsule, Rfl: 5   EPINEPHrine 0.3 mg/0.3 mL IJ SOAJ injection, Inject 0.3 mg into the muscle as needed for anaphylaxis., Disp: 2 each, Rfl: 1   gabapentin (NEURONTIN) 300 MG capsule, Take 1 capsule  (300 mg total) by mouth 3 (three) times daily. (Patient taking differently: Take 300 mg by mouth at bedtime.), Disp: 90 capsule, Rfl: 5   HYDROcodone-acetaminophen (NORCO/VICODIN) 5-325 MG tablet, Take 1 tablet by mouth every 6 (six) hours as needed for moderate pain., Disp: , Rfl:    Insulin Pen Needle (NOVOFINE PLUS PEN NEEDLE) 32G X 4 MM MISC, 1 application by Does not apply route daily., Disp: 100 each, Rfl: 1   levocetirizine (XYZAL) 5 MG tablet, Take 1 tablet (5 mg total) by mouth in the morning and at bedtime., Disp: 60 tablet, Rfl: 5   levothyroxine (SYNTHROID) 200 MCG tablet, Take 1 tablet (200 mcg total) by mouth daily., Disp: 90 tablet, Rfl: 3   Liraglutide -Weight Management (SAXENDA) 18 MG/3ML SOPN, Inject 3 mg into the skin daily., Disp: 15 mL, Rfl: 2   LORazepam (ATIVAN) 1 MG tablet, Take 1 mg by mouth at bedtime. , Disp: , Rfl:    losartan (COZAAR) 100 MG tablet, Take 100 mg by mouth daily. , Disp: , Rfl:    meloxicam (MOBIC) 15 MG tablet, Take 15 mg by mouth daily., Disp: , Rfl:  methocarbamol (ROBAXIN-750) 750 MG tablet, Take 1 tablet (750 mg total) by mouth at bedtime., Disp: , Rfl:    Multiple Vitamin (MULTIVITAMIN) tablet, Take 2 tablets by mouth daily. gummy, Disp: , Rfl:    ondansetron (ZOFRAN ODT) 4 MG disintegrating tablet, Take 1 tablet (4 mg total) by mouth every 8 (eight) hours as needed for nausea or vomiting., Disp: 10 tablet, Rfl: 0   oxybutynin (DITROPAN) 5 MG tablet, Take 5 mg by mouth 2 (two) times daily., Disp: , Rfl:    pantoprazole (PROTONIX) 40 MG tablet, Take 40 mg by mouth 2 (two) times daily., Disp: , Rfl:    rosuvastatin (CRESTOR) 10 MG tablet, Take 10 mg by mouth daily., Disp: , Rfl:    zolpidem (AMBIEN) 5 MG tablet, Take 5 mg by mouth at bedtime as needed for sleep., Disp: , Rfl:   Social History   Tobacco Use  Smoking Status Former   Packs/day: 0.50   Years: 5.00   Pack years: 2.50   Types: Cigarettes   Quit date: 09/15/2017   Years since  quitting: 3.8  Smokeless Tobacco Never    Allergies  Allergen Reactions   Penicillins Rash    Has patient had a PCN reaction causing immediate rash, facial/tongue/throat swelling, SOB or lightheadedness with hypotension: {Yes Has patient had a PCN reaction causing severe rash involving mucus membranes or skin necrosis:unknown Has patient had a PCN reaction that required hospitalizationNo Has patient had a PCN reaction occurring within the last 10 years:No If all of the above answers are "NO", then may proceed with Cephalosporin use.    Objective:  There were no vitals filed for this visit. There is no height or weight on file to calculate BMI. Constitutional Well developed. Well nourished.  Vascular Dorsalis pedis pulses palpable bilaterally. Posterior tibial pulses palpable bilaterally. Capillary refill normal to all digits.  No cyanosis or clubbing noted. Pedal hair growth normal.  Neurologic Normal speech. Oriented to person, place, and time. Epicritic sensation to light touch grossly present bilaterally.  Dermatologic Nails well groomed and normal in appearance. No open wounds. No skin lesions.  Orthopedic: No pain on palpation to the right dorsal midfoot.  Arthritic changes clinically appreciated to the dorsal midfoot.  Ganglion cyst noted very multilobulated and mobile.  No positive Tinel's sign noted.  No extensor tendinitis noted.   Radiographs: Review of prior x-rays show that there is mild osteoarthritic changes to the dorsal midfoot.  No other bony abnormalities identified Assessment:   1. Arthritis of midfoot   2. Ganglion cyst of foot    Plan:  Patient was evaluated and treated and all questions answered.  Right dorsal midfoot arthritis with underlying ganglion cyst -I explained to the patient the etiology of arthritis and ganglion cyst and worse treatment options were extensively discussed.  Given that this is not causing her any pain I would discuss shoe gear  modification and offloading the pressure site.  If in the future starts hurting her, I have asked her to come see me right away we can surgically take it out or do a steroid injection.  Patient states understanding and will see me immediately if something comes up.  No follow-ups on file.

## 2021-07-22 ENCOUNTER — Ambulatory Visit (INDEPENDENT_AMBULATORY_CARE_PROVIDER_SITE_OTHER): Payer: Commercial Managed Care - PPO

## 2021-07-22 DIAGNOSIS — J309 Allergic rhinitis, unspecified: Secondary | ICD-10-CM | POA: Diagnosis not present

## 2021-07-27 ENCOUNTER — Ambulatory Visit (INDEPENDENT_AMBULATORY_CARE_PROVIDER_SITE_OTHER): Payer: Commercial Managed Care - PPO

## 2021-07-27 DIAGNOSIS — J309 Allergic rhinitis, unspecified: Secondary | ICD-10-CM | POA: Diagnosis not present

## 2021-07-30 ENCOUNTER — Other Ambulatory Visit: Payer: Self-pay | Admitting: Allergy & Immunology

## 2021-08-05 ENCOUNTER — Ambulatory Visit: Payer: Commercial Managed Care - PPO | Admitting: Allergy & Immunology

## 2021-08-08 ENCOUNTER — Ambulatory Visit: Payer: Commercial Managed Care - PPO | Admitting: Orthopedic Surgery

## 2021-08-26 ENCOUNTER — Ambulatory Visit (INDEPENDENT_AMBULATORY_CARE_PROVIDER_SITE_OTHER): Payer: Commercial Managed Care - PPO

## 2021-08-26 DIAGNOSIS — J309 Allergic rhinitis, unspecified: Secondary | ICD-10-CM

## 2021-08-31 ENCOUNTER — Ambulatory Visit (INDEPENDENT_AMBULATORY_CARE_PROVIDER_SITE_OTHER): Payer: Commercial Managed Care - PPO | Admitting: *Deleted

## 2021-08-31 DIAGNOSIS — J309 Allergic rhinitis, unspecified: Secondary | ICD-10-CM | POA: Diagnosis not present

## 2021-09-07 ENCOUNTER — Ambulatory Visit (INDEPENDENT_AMBULATORY_CARE_PROVIDER_SITE_OTHER): Payer: Commercial Managed Care - PPO

## 2021-09-07 DIAGNOSIS — J309 Allergic rhinitis, unspecified: Secondary | ICD-10-CM | POA: Diagnosis not present

## 2021-09-11 ENCOUNTER — Other Ambulatory Visit: Payer: Self-pay | Admitting: Orthopedic Surgery

## 2021-09-11 DIAGNOSIS — G5603 Carpal tunnel syndrome, bilateral upper limbs: Secondary | ICD-10-CM

## 2021-09-30 ENCOUNTER — Ambulatory Visit (INDEPENDENT_AMBULATORY_CARE_PROVIDER_SITE_OTHER): Payer: Commercial Managed Care - PPO

## 2021-09-30 DIAGNOSIS — J309 Allergic rhinitis, unspecified: Secondary | ICD-10-CM

## 2021-10-19 ENCOUNTER — Ambulatory Visit (INDEPENDENT_AMBULATORY_CARE_PROVIDER_SITE_OTHER): Payer: Commercial Managed Care - PPO

## 2021-10-19 DIAGNOSIS — J309 Allergic rhinitis, unspecified: Secondary | ICD-10-CM

## 2021-11-09 ENCOUNTER — Ambulatory Visit (INDEPENDENT_AMBULATORY_CARE_PROVIDER_SITE_OTHER): Payer: Commercial Managed Care - PPO

## 2021-11-09 DIAGNOSIS — J309 Allergic rhinitis, unspecified: Secondary | ICD-10-CM | POA: Diagnosis not present

## 2021-11-17 DIAGNOSIS — J3089 Other allergic rhinitis: Secondary | ICD-10-CM | POA: Diagnosis not present

## 2021-11-17 NOTE — Progress Notes (Addendum)
VIALS EXP 11-18-22 ?

## 2021-11-30 ENCOUNTER — Ambulatory Visit (INDEPENDENT_AMBULATORY_CARE_PROVIDER_SITE_OTHER): Payer: Commercial Managed Care - PPO | Admitting: *Deleted

## 2021-11-30 DIAGNOSIS — J309 Allergic rhinitis, unspecified: Secondary | ICD-10-CM | POA: Diagnosis not present

## 2021-12-21 ENCOUNTER — Ambulatory Visit (INDEPENDENT_AMBULATORY_CARE_PROVIDER_SITE_OTHER): Payer: Commercial Managed Care - PPO

## 2021-12-21 DIAGNOSIS — J309 Allergic rhinitis, unspecified: Secondary | ICD-10-CM | POA: Diagnosis not present

## 2021-12-27 ENCOUNTER — Ambulatory Visit (INDEPENDENT_AMBULATORY_CARE_PROVIDER_SITE_OTHER): Payer: 59

## 2021-12-27 ENCOUNTER — Ambulatory Visit
Admission: RE | Admit: 2021-12-27 | Discharge: 2021-12-27 | Disposition: A | Discharge status: Home / Self Care | Payer: 59 | Source: Ambulatory Visit | Attending: Urgent Care | Admitting: Urgent Care

## 2021-12-27 VITALS — BP 153/92 | HR 97 | Temp 98.9°F | Resp 20

## 2021-12-27 DIAGNOSIS — M25572 Pain in left ankle and joints of left foot: Secondary | ICD-10-CM

## 2021-12-27 MED ORDER — METHYLPREDNISOLONE SODIUM SUCC 125 MG IJ SOLR
125.0000 mg | Freq: Once | INTRAMUSCULAR | Status: AC
  Administered 2021-12-27: 125 mg via INTRAMUSCULAR

## 2021-12-27 MED ORDER — DICLOFENAC SODIUM 75 MG PO TBEC: 75.0000 mg | DELAYED_RELEASE_TABLET | Freq: Two times a day (BID) | ORAL | 0 refills | Status: DC

## 2021-12-27 NOTE — ED Provider Notes (Signed)
?[place] ? ? ?MRN: [id number] DOB: 12-15-1970 ? ?Subjective:  ? ?Christine Mason is a 51 y.o. female presenting for 1 day history of acute onset severe left ankle pain, proximal left foot pain.  Denies any particular fall or trauma, redness, warmth.  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  Works as a [employer] manager.  Does full-time work.  Has a history of arthritis that is severe in the left knee and needs a knee replacement.  Has not been evaluated for the ankle or foot in this regard. No history of diabetes.  ? ?No current facility-administered medications for this encounter. ? ?Current Outpatient Medications:  ?  atenolol (TENORMIN) 50 MG tablet, Take 1 tablet (50 mg total) by mouth daily. (Patient taking differently: Take 50 mg by mouth at bedtime.), Disp: , Rfl:  ?  azithromycin (ZITHROMAX) 250 MG tablet, Take 2 tabs PO x 1 dose, then 1 tab PO QD x 4 days, Disp: 6 tablet, Rfl: 0 ?  chlorthalidone (HYGROTON) 25 MG tablet, TAKE 1 TABLET BY MOUTH ONCE EVERY MONRING., Disp: 30 tablet, Rfl: 0 ?  Cholecalciferol (VITAMIN D3) 50 MCG (2000 UT) TABS, Take 2,000 Units by mouth daily., Disp: , Rfl:  ?  desvenlafaxine (PRISTIQ) 50 MG 24 hr tablet, Take 50 mg by mouth daily., Disp: , Rfl:  ?  doxepin (SINEQUAN) 25 MG capsule, TAKE (2) CAPSULES BY MOUTH ONCE AT BEDTIME., Disp: 60 capsule, Rfl: 5 ?  EPINEPHrine 0.3 mg/0.3 mL IJ SOAJ injection, Inject 0.3 mg into the muscle as needed for anaphylaxis., Disp: 2 each, Rfl: 1 ?  gabapentin (NEURONTIN) 300 MG capsule, Take 1 capsule (300 mg total) by mouth at bedtime., Disp: 30 capsule, Rfl: 5 ?  HYDROcodone-acetaminophen (NORCO/VICODIN) 5-325 MG tablet, Take 1 tablet by mouth every 6 (six) hours as needed for moderate pain., Disp: , Rfl:  ?  Insulin Pen Needle (NOVOFINE PLUS PEN NEEDLE) 32G X 4 MM MISC, 1 application by Does not apply route daily., Disp: 100 each, Rfl: 1 ?  levocetirizine (XYZAL) 5 MG tablet, TAKE (1) TABLET BY MOUTH ONCE  IN THE MORNING AND AT BEDTIME., Disp: 60 tablet, Rfl: 5 ?  levothyroxine (SYNTHROID) 200 MCG tablet, Take 1 tablet (200 mcg total) by mouth daily., Disp: 90 tablet, Rfl: 3 ?  Liraglutide -Weight Management (SAXENDA) 18 MG/3ML SOPN, Inject 3 mg into the skin daily., Disp: 15 mL, Rfl: 2 ?  LORazepam (ATIVAN) 1 MG tablet, Take 1 mg by mouth at bedtime. , Disp: , Rfl:  ?  losartan (COZAAR) 100 MG tablet, Take 100 mg by mouth daily. , Disp: , Rfl:  ?  meloxicam (MOBIC) 15 MG tablet, Take 15 mg by mouth daily., Disp: , Rfl:  ?  methocarbamol (ROBAXIN-750) 750 MG tablet, Take 1 tablet (750 mg total) by mouth at bedtime., Disp: , Rfl:  ?  Multiple Vitamin (MULTIVITAMIN) tablet, Take 2 tablets by mouth daily. gummy, Disp: , Rfl:  ?  ondansetron (ZOFRAN ODT) 4 MG disintegrating tablet, Take 1 tablet (4 mg total) by mouth every 8 (eight) hours as needed for nausea or vomiting., Disp: 10 tablet, Rfl: 0 ?  oxybutynin (DITROPAN) 5 MG tablet, Take 5 mg by mouth 2 (two) times daily., Disp: , Rfl:  ?  pantoprazole (PROTONIX) 40 MG tablet, Take 40 mg by mouth 2 (two) times daily., Disp: , Rfl:  ?  rosuvastatin (CRESTOR) 10 MG tablet, Take 10 mg by mouth daily., Disp: , Rfl:  ?  zolpidem (AMBIEN) 5 MG tablet, Take 5 mg by mouth at bedtime as needed for sleep., Disp: , Rfl:   ? ?Allergies  ?Allergen Reactions  ? Penicillins Rash  ?  Has patient had a PCN reaction causing immediate rash, facial/tongue/throat swelling, SOB or lightheadedness with hypotension: {Yes ?Has patient had a PCN reaction causing severe rash involving mucus membranes or skin necrosis:unknown ?Has patient had a PCN reaction that required hospitalizationNo ?Has patient had a PCN reaction occurring within the last 10 years:No ?If all of the above answers are "NO", then may proceed with Cephalosporin use. ?  ? ? ?Past Medical History:  ?Diagnosis Date  ? Anxiety   ? Carpal tunnel syndrome   ? bi lat hands  ? Chronic leg pain   ? right  ? Chronic pain of left knee   ?  Depression   ? Diverticulitis   ? GERD (gastroesophageal reflux disease)   ? Heart murmur   ? History of kidney stones   ? Hypertension   ? Hyperthyroidism   ? Sleep apnea   ?  no C- Pap  ?  ? ?Past Surgical History:  ?Procedure Laterality Date  ? ACNE CYST REMOVAL    ? CHOLECYSTECTOMY    ? [place] OF UTERUS  1998  ? ESOPHAGOGASTRODUODENOSCOPY (EGD) WITH PROPOFOL N/A 06/13/2019  ? Procedure: ESOPHAGOGASTRODUODENOSCOPY (EGD) WITH PROPOFOL;  Surgeon: Rogene Houston, MD;  [facility];  Service: Endoscopy;  Laterality: N/A;  7:30  ? KNEE SURGERY    ? MALONEY DILATION  06/13/2019  ? Procedure: MALONEY DILATION;  Surgeon: Rogene Houston, MD;  Location: [facility];  Service: Endoscopy;;  ? MASS EXCISION    ? THORACOTOMY Right 2016  ? THYROIDECTOMY N/A 07/01/2020  ? Procedure: TOTAL THYROIDECTOMY WITH Central compartment lymph node disection;  Surgeon: Armandina Gemma, MD;  Location: [facility];  Service: General;  Laterality: N/A;  ? ? ?Family History  ?Problem Relation Age of Onset  ? Diabetes Mother   ? Hypertension Mother   ? Breast cancer Mother 60  ?     dx again at 40; PALB2 and CHEK2 positive  ? Diabetes Father   ? Hypertension Father   ? Coronary artery disease Father   ? Hypertension Sister   ? Breast cancer Sister   ?     diagnosed in her 51's  ? Thyroid cancer Maternal Uncle 46  ?     medullary  ? Lung cancer Maternal Grandmother   ? Leukemia Maternal Grandfather 32  ?     dx with hairy cell leukemia at 67; CLL at 1  ? Lymphoma Maternal Grandfather 35  ?     hodgkins lymphoma  ? Brain cancer Paternal Grandmother   ? Prostate cancer Paternal Grandfather   ? Lung cancer Maternal Aunt 64  ? Lymphoma Maternal Uncle 17  ?     follicular lymphoma  ? Allergic rhinitis Neg Hx   ? Asthma Neg Hx   ? ? ?Social History  ? ?Tobacco Use  ? Smoking status: Former  ?  Packs/day: 0.50  ?  Years: 5.00  ?  Pack years: 2.50  ?  Types: Cigarettes  ?  Quit date: 09/15/2017  ?  Years since quitting: 4.2  ?  Smokeless tobacco: Never  ?Vaping Use  ? Vaping Use: Never used  ?Substance Use Topics  ? Alcohol use: No  ? Drug use: No  ? ? ?ROS ? ? ?Objective:  ? ?Vitals: ?BP Marland Kitchen)  153/92 (BP Location: Right Wrist)   Pulse 97   Temp 98.9 ?F (37.2 ?C) (Oral)   Resp 20   LMP 05/22/2015   SpO2 97%  ? ?Physical Exam ?Constitutional:   ?   General: She is not in acute distress. ?   Appearance: Normal appearance. She is well-developed. She is obese. She is not ill-appearing, toxic-appearing or diaphoretic.  ?HENT:  ?   Head: Normocephalic and atraumatic.  ?   Nose: Nose normal.  ?   Mouth/Throat:  ?   Mouth: Mucous membranes are moist.  ?Eyes:  ?   General: No scleral icterus.    ?   Right eye: No discharge.     ?   Left eye: No discharge.  ?   Extraocular Movements: Extraocular movements intact.  ?Cardiovascular:  ?   Rate and Rhythm: Normal rate.  ?Pulmonary:  ?   Effort: Pulmonary effort is normal.  ?Musculoskeletal:  ?   Left ankle: No swelling, deformity, ecchymosis or lacerations. Tenderness present over the medial malleolus. No lateral malleolus, ATF ligament, AITF ligament, CF ligament, posterior TF ligament, base of 5th metatarsal or proximal fibula tenderness. Normal range of motion.  ?   Left Achilles Tendon: No tenderness or defects. Thompson's test negative.  ?Skin: ?   General: Skin is warm and dry.  ?Neurological:  ?   General: No focal deficit present.  ?   Mental Status: She is alert and oriented to person, place, and time.  ?Psychiatric:     ?   Mood and Affect: Mood normal.     ?   Behavior: Behavior normal.  ? ? ?DG Ankle Complete Left ? ?Result Date: 12/27/2021 ?CLINICAL DATA:  Medial ankle pain EXAM: LEFT ANKLE COMPLETE - 3+ VIEW COMPARISON:  None. FINDINGS: Normal alignment without acute osseous finding or fracture. Small well corticated ossicles about the malleoli bilaterally. Talus and calcaneus intact. Small plantar calcaneal spur. Minimal bony spurring dorsally of the tarsal bones. IMPRESSION: No acute  abnormality. Electronically Signed   By: Jerilynn Mages.  Shick M.D.   On: 12/27/2021 15:53   ? ?IM Solu-Medrol in clinic and 125 mg. ? ?Assessment and Plan :  ? ?PDMP not reviewed this encounter. ? ?1. Acute left ankle

## 2021-12-27 NOTE — ED Triage Notes (Signed)
Pt reports pain and swelling in left foot and left ankle x 1 day, when she was working her 12 hrs shift.  ?

## 2022-01-06 ENCOUNTER — Ambulatory Visit (INDEPENDENT_AMBULATORY_CARE_PROVIDER_SITE_OTHER): Payer: 59 | Admitting: Endocrinology

## 2022-01-06 ENCOUNTER — Encounter: Payer: Self-pay | Admitting: Endocrinology

## 2022-01-06 VITALS — BP 138/96 | HR 85 | Ht 62.0 in | Wt 322.6 lb

## 2022-01-06 DIAGNOSIS — C73 Malignant neoplasm of thyroid gland: Secondary | ICD-10-CM | POA: Diagnosis not present

## 2022-01-06 DIAGNOSIS — E890 Postprocedural hypothyroidism: Secondary | ICD-10-CM

## 2022-01-06 LAB — T4, FREE: Free T4: 1.31 ng/dL (ref 0.60–1.60)

## 2022-01-06 LAB — TSH: TSH: 0.77 u[IU]/mL (ref 0.35–5.50)

## 2022-01-06 NOTE — Patient Instructions (Addendum)
Blood tests are requested for you today.  We'll let you know about the results.   ?You should have an endocrinology follow-up appointment in 6 months.   ? ?

## 2022-01-06 NOTE — Progress Notes (Signed)
? ?Subjective:  ? ? Patient ID: Christine Mason, female    DOB: 09/10/1971, 51 y.o.   MRN: [id number] ? 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  ?10/21 thyroidect: path shows PTC T1N1Mx.   ?12/21 RAI 98 mCi.   ?12/21 post-therapy scan: Thyroid remnant.   ?2/22 TG undet (Ab neg).   ?7/22 TG undet (Ab neg). ?10/22 TG=0.8 (Ab neg)  ?Denies neck pain and swelling.   ?She takes synthroid as rx'ed.  pt states she feels well in general.   ?Past Medical History:  ?Diagnosis Date  ? Anxiety   ? Carpal tunnel syndrome   ? bi lat hands  ? Chronic leg pain   ? right  ? Chronic pain of left knee   ? Depression   ? Diverticulitis   ? GERD (gastroesophageal reflux disease)   ? Heart murmur   ? History of kidney stones   ? Hypertension   ? Hyperthyroidism   ? Sleep apnea   ?  no C- Pap  ? ? ?Past Surgical History:  ?Procedure Laterality Date  ? ACNE CYST REMOVAL    ? CHOLECYSTECTOMY    ? [place] OF UTERUS  1998  ? ESOPHAGOGASTRODUODENOSCOPY (EGD) WITH PROPOFOL N/A 06/13/2019  ? Procedure: ESOPHAGOGASTRODUODENOSCOPY (EGD) WITH PROPOFOL;  Surgeon: Rogene Houston, MD;  [facility];  Service: Endoscopy;  Laterality: N/A;  7:30  ? KNEE SURGERY    ? MALONEY DILATION  06/13/2019  ? Procedure: MALONEY DILATION;  Surgeon: Rogene Houston, MD;  [facility];  Service: Endoscopy;;  ? MASS EXCISION    ? THORACOTOMY Right 2016  ? THYROIDECTOMY N/A 07/01/2020  ? Procedure: TOTAL THYROIDECTOMY WITH Central compartment lymph node disection;  Surgeon: Armandina Gemma, MD;  Location: [facility];  Service: General;  Laterality: N/A;  ? ? ?Social History  ? ?Socioeconomic History  ? Marital status: Married  ?  Spouse name: Not on file  ? Number of children: 0  ? Years of education: Not on file  ? Highest education level: Not on file  ?Occupational History  ?  Employer: [employer]  ?Tobacco Use  ? Smoking status: Former  ?  Packs/day: 0.50  ?  Years: 5.00  ?  Pack years: 2.50  ?  Types: Cigarettes  ?  Quit date: 09/15/2017  ?  Years since quitting: 4.3  ? Smokeless tobacco: Never  ?Vaping Use  ? Vaping Use: Never used  ?Substance and Sexual Activity  ? Alcohol use: No  ? Drug use: No  ? Sexual activity: Not Currently  ?  Birth control/protection: Post-menopausal  ?Other Topics Concern  ? Not on file  ?Social History Narrative  ? Not on file  ? ?Social Determinants of Health  ? ?Financial Resource Strain: Not on file  ?Food Insecurity: Not on file  ?Transportation Needs: Not on file  ?Physical Activity: Not on file  ?Stress: Not on file  ?Social Connections: Not on file  ?Intimate Partner Violence: Not on file  ? ? ?Current Outpatient Medications on File Prior to Visit  ?Medication Sig Dispense Refill  ? atenolol (TENORMIN) 50 MG tablet Take 1 tablet (50 mg total) by mouth daily. (Patient taking differently: Take 50 mg by mouth at bedtime.)    ? Cholecalciferol (VITAMIN D3) 50 MCG (2000 UT)  TABS Take 2,000 Units by mouth daily.    ? desvenlafaxine (PRISTIQ) 50 MG 24 hr tablet Take 50 mg by mouth daily.    ? diclofenac (VOLTAREN) 75 MG EC tablet Take 1 tablet (75 mg total) by mouth 2 (two) times daily. 30 tablet 0  ? doxepin (SINEQUAN) 25 MG capsule TAKE (2) CAPSULES BY MOUTH ONCE AT BEDTIME. 60 capsule 5  ? EPINEPHrine 0.3 mg/0.3 mL IJ SOAJ injection Inject 0.3 mg into the muscle as needed for anaphylaxis. 2 each 1  ? gabapentin (NEURONTIN) 300 MG capsule Take 1 capsule (300 mg total) by mouth at bedtime. 30 capsule 5  ? HYDROcodone-acetaminophen (NORCO/VICODIN) 5-325 MG tablet Take 1 tablet by mouth every 6 (six) hours as needed for moderate pain.    ? levocetirizine (XYZAL) 5 MG tablet TAKE (1) TABLET BY MOUTH ONCE IN THE MORNING AND AT BEDTIME. 60 tablet 5  ? levothyroxine (SYNTHROID) 200 MCG tablet Take 1 tablet (200 mcg total) by mouth daily. 90 tablet 3  ? LORazepam (ATIVAN)  1 MG tablet Take 1 mg by mouth at bedtime.     ? losartan (COZAAR) 100 MG tablet Take 100 mg by mouth daily.     ? methocarbamol (ROBAXIN-750) 750 MG tablet Take 1 tablet (750 mg total) by mouth at bedtime.    ? Multiple Vitamin (MULTIVITAMIN) tablet Take 2 tablets by mouth daily. gummy    ? ondansetron (ZOFRAN ODT) 4 MG disintegrating tablet Take 1 tablet (4 mg total) by mouth every 8 (eight) hours as needed for nausea or vomiting. 10 tablet 0  ? oxybutynin (DITROPAN) 5 MG tablet Take 5 mg by mouth 2 (two) times daily.    ? pantoprazole (PROTONIX) 40 MG tablet Take 40 mg by mouth 2 (two) times daily.    ? rosuvastatin (CRESTOR) 10 MG tablet Take 10 mg by mouth daily.    ? ?No current facility-administered medications on file prior to visit.  ? ? ?Allergies  ?Allergen Reactions  ? Penicillins Rash  ?  Has patient had a PCN reaction causing immediate rash, facial/tongue/throat swelling, SOB or lightheadedness with hypotension: {Yes ?Has patient had a PCN reaction causing severe rash involving mucus membranes or skin necrosis:unknown ?Has patient had a PCN reaction that required hospitalizationNo ?Has patient had a PCN reaction occurring within the last 10 years:No ?If all of the above answers are "NO", then may proceed with Cephalosporin use. ?  ? ? ?Family History  ?Problem Relation Age of Onset  ? Diabetes Mother   ? Hypertension Mother   ? Breast cancer Mother 65  ?     dx again at 38; PALB2 and CHEK2 positive  ? Diabetes Father   ? Hypertension Father   ? Coronary artery disease Father   ? Hypertension Sister   ? Breast cancer Sister   ?     diagnosed in her 84's  ? Thyroid cancer Maternal Uncle 46  ?     medullary  ? Lung cancer Maternal Grandmother   ? Leukemia Maternal Grandfather 83  ?     dx with hairy cell leukemia at 49; CLL at 64  ? Lymphoma Maternal Grandfather 38  ?     hodgkins lymphoma  ? Brain cancer Paternal Grandmother   ? Prostate cancer Paternal Grandfather   ? Lung cancer Maternal Aunt 46  ?  Lymphoma Maternal Uncle 56  ?     follicular lymphoma  ? Allergic rhinitis Neg Hx   ? Asthma Neg Hx   ? ? ?  BP (!) 138/96 (BP Location: Left Arm, Patient Position: Sitting, Cuff Size: Normal)   Pulse 85   Ht 5' 2"  (1.575 m)   Wt (!) 322 lb 9.6 oz (146.3 kg)   LMP 05/22/2015   SpO2 97%   BMI 59.00 kg/m?  ? ? ?Review of Systems ? ?   ?Objective:  ? Physical Exam ?VITAL SIGNS:  See vs page ?GENERAL: no distress ?Neck: a healed scar is present.  I do not appreciate a nodule in the thyroid or elsewhere in the neck.   ? ?Lab Results  ?Component Value Date  ? TSH 0.77 01/06/2022  ? ?   ?Assessment & Plan:  ?Hypothyroidism: well-controlled.  Please continue the same synthroid ?PTC: recheck today ? ?

## 2022-01-09 ENCOUNTER — Other Ambulatory Visit: Payer: Self-pay | Admitting: Endocrinology

## 2022-01-09 DIAGNOSIS — C73 Malignant neoplasm of thyroid gland: Secondary | ICD-10-CM

## 2022-01-09 LAB — THYROGLOBULIN LEVEL: Thyroglobulin: 0.6 ng/mL — ABNORMAL LOW

## 2022-01-09 LAB — THYROGLOBULIN ANTIBODY: Thyroglobulin Ab: <1 IU/mL (ref <=1)

## 2022-01-13 ENCOUNTER — Ambulatory Visit
Admission: RE | Admit: 2022-01-13 | Discharge: 2022-01-13 | Disposition: A | Discharge status: Home / Self Care | Payer: 59 | Source: Ambulatory Visit | Attending: Endocrinology | Admitting: Endocrinology

## 2022-01-13 ENCOUNTER — Other Ambulatory Visit: Payer: Self-pay | Admitting: Endocrinology

## 2022-01-13 ENCOUNTER — Ambulatory Visit (INDEPENDENT_AMBULATORY_CARE_PROVIDER_SITE_OTHER): Payer: 59

## 2022-01-13 DIAGNOSIS — J309 Allergic rhinitis, unspecified: Secondary | ICD-10-CM | POA: Diagnosis not present

## 2022-01-13 DIAGNOSIS — C73 Malignant neoplasm of thyroid gland: Secondary | ICD-10-CM

## 2022-01-16 ENCOUNTER — Encounter: Payer: Self-pay | Admitting: Endocrinology

## 2022-01-16 ENCOUNTER — Other Ambulatory Visit: Payer: Self-pay | Admitting: Endocrinology

## 2022-01-16 DIAGNOSIS — C73 Malignant neoplasm of thyroid gland: Secondary | ICD-10-CM

## 2022-01-18 ENCOUNTER — Ambulatory Visit (HOSPITAL_COMMUNITY)
Admission: RE | Admit: 2022-01-18 | Discharge: 2022-01-18 | Disposition: A | Discharge status: Home / Self Care | Payer: 59 | Source: Ambulatory Visit | Attending: Endocrinology | Admitting: Endocrinology

## 2022-01-18 ENCOUNTER — Ambulatory Visit (INDEPENDENT_AMBULATORY_CARE_PROVIDER_SITE_OTHER): Payer: 59

## 2022-01-18 ENCOUNTER — Encounter (HOSPITAL_COMMUNITY): Payer: Self-pay

## 2022-01-18 DIAGNOSIS — C73 Malignant neoplasm of thyroid gland: Secondary | ICD-10-CM | POA: Insufficient documentation

## 2022-01-18 DIAGNOSIS — J309 Allergic rhinitis, unspecified: Secondary | ICD-10-CM | POA: Diagnosis not present

## 2022-01-18 MED ORDER — LIDOCAINE HCL (PF) 2 % IJ SOLN
INTRAMUSCULAR | Status: AC
  Administered 2022-01-18: 10 mL
  Filled 2022-01-18: qty 10

## 2022-01-18 NOTE — Progress Notes (Signed)
PT tolerated thyroid biopsy procedure well today. Labs obtained and sent for pathology. PT ambulatory at discharge with no acute distress noted and verbalized understanding of discharge instructions.  ?

## 2022-01-19 ENCOUNTER — Encounter: Payer: Self-pay | Admitting: Endocrinology

## 2022-01-19 ENCOUNTER — Ambulatory Visit (HOSPITAL_COMMUNITY): Admission: RE | Admit: 2022-01-19 | Payer: 59 | Source: Ambulatory Visit

## 2022-01-19 LAB — CYTOLOGY - NON PAP

## 2022-01-25 ENCOUNTER — Ambulatory Visit (INDEPENDENT_AMBULATORY_CARE_PROVIDER_SITE_OTHER): Payer: 59

## 2022-01-25 DIAGNOSIS — J309 Allergic rhinitis, unspecified: Secondary | ICD-10-CM

## 2022-02-03 ENCOUNTER — Other Ambulatory Visit: Payer: Self-pay | Admitting: Allergy & Immunology

## 2022-02-10 ENCOUNTER — Ambulatory Visit (INDEPENDENT_AMBULATORY_CARE_PROVIDER_SITE_OTHER): Payer: 59

## 2022-02-10 DIAGNOSIS — J309 Allergic rhinitis, unspecified: Secondary | ICD-10-CM

## 2022-02-15 ENCOUNTER — Ambulatory Visit (INDEPENDENT_AMBULATORY_CARE_PROVIDER_SITE_OTHER): Payer: 59

## 2022-02-15 DIAGNOSIS — J309 Allergic rhinitis, unspecified: Secondary | ICD-10-CM | POA: Diagnosis not present

## 2022-02-22 ENCOUNTER — Ambulatory Visit (INDEPENDENT_AMBULATORY_CARE_PROVIDER_SITE_OTHER): Payer: 59

## 2022-02-22 DIAGNOSIS — J309 Allergic rhinitis, unspecified: Secondary | ICD-10-CM

## 2022-02-27 ENCOUNTER — Other Ambulatory Visit: Payer: Self-pay | Admitting: Radiology

## 2022-02-27 DIAGNOSIS — G5603 Carpal tunnel syndrome, bilateral upper limbs: Secondary | ICD-10-CM

## 2022-02-27 MED ORDER — GABAPENTIN 300 MG PO CAPS: 300.0000 mg | ORAL_CAPSULE | Freq: Every day | ORAL | 3 refills | Status: DC

## 2022-03-01 ENCOUNTER — Telehealth: Payer: Self-pay | Admitting: Allergy & Immunology

## 2022-03-01 NOTE — Telephone Encounter (Signed)
Patient is requesting refill for her xyzal. Advised patient she would need to make an appointment as it is over a year since she was last seen. Advised pt after appt is made the office could send in a courtesy refill.   Pt scheduled appt for 03-08-2022. Pt is requesting courtesy refill to be sent to [facility].

## 2022-03-03 MED ORDER — LEVOCETIRIZINE DIHYDROCHLORIDE 5 MG PO TABS: 5.0000 mg | ORAL_TABLET | Freq: Every evening | ORAL | 0 refills | Status: DC

## 2022-03-03 NOTE — Telephone Encounter (Signed)
Medication has been sent in. Patient has been notified by a detailed voicemail.

## 2022-03-07 NOTE — Patient Instructions (Incomplete)
Allergic rhinitis/pruritus Continue allergen avoidance measures directed toward pollen, cat, mold, and dust mite as listed below Continue allergen immunotherapy once every 3 weeks and have access to an epinephrine autoinjector set Begin cetirizine 10 mg once a day as needed for runny nose or itch. You may take an additional dose of cetirizine 10 mg as needed for breakthrough symptoms Continue doxepin 25 mg once at bedtime as needed for itch Continue Nasacort 2 sprays in each nostril once a day as needed for stuffy nose.  In the right nostril, point the applicator out toward the right ear. In the left nostril, point the applicator out toward the left ear Consider saline nasal rinses as needed for nasal symptoms. Use this before any medicated nasal sprays for best result  Wheeze Begin Arnuity Ellipta 1 puff once a day to prevent cough or wheeze Continue albuterol 2 puffs once every 4 hours as needed for cough or wheeze You may use albuterol 2 puffs 5-15 minutes before activity to decrease cough or wheeze  Reflux Continue dietary and lifestyle modifications as listed above Continue pantoprazole as previously prescribed  Call the clinic if this treatment plan is not working well for you  Follow up in 3 months or sooner if needed.

## 2022-03-07 NOTE — Progress Notes (Unsigned)
   [place], [address] [place] [address] Dept: [phone]  FOLLOW UP NOTE  Patient ID: Christine Mason, female    DOB: 04-30-1971  Age: 51 y.o. MRN: [id number] Date of Office Visit: 03/08/2022  Assessment  Chief Complaint: No chief complaint on file.  HPI Christine Mason is a 51 year old female who presents to the clinic for follow-up visit.  She was last seen in this clinic on 02/02/2021 by Dr. Ernst Bowler for evaluation of allergic rhinitis, dysphagia, and obstructive sleep apnea.   Drug Allergies:  Allergies  Allergen Reactions   Penicillins Rash    Has patient had a PCN reaction causing immediate rash, facial/tongue/throat swelling, SOB or lightheadedness with hypotension: {Yes Has patient had a PCN reaction causing severe rash involving mucus membranes or skin necrosis:unknown Has patient had a PCN reaction that required hospitalizationNo Has patient had a PCN reaction occurring within the last 10 years:No If all of the above answers are "NO", then may proceed with Cephalosporin use.     Physical Exam: LMP 05/22/2015    Physical Exam  Diagnostics:    Assessment and Plan: No diagnosis found.  No orders of the defined types were placed in this encounter.   There are no Patient Instructions on file for this visit.  No follow-ups on file.    Thank you for the opportunity to care for this patient.  Please do not hesitate to contact me with questions.  Gareth Morgan, FNP [facility]

## 2022-03-08 ENCOUNTER — Telehealth: Payer: Self-pay

## 2022-03-08 ENCOUNTER — Ambulatory Visit (INDEPENDENT_AMBULATORY_CARE_PROVIDER_SITE_OTHER): Payer: 59 | Admitting: Family Medicine

## 2022-03-08 ENCOUNTER — Encounter: Payer: Self-pay | Admitting: Family Medicine

## 2022-03-08 ENCOUNTER — Other Ambulatory Visit: Payer: Self-pay

## 2022-03-08 VITALS — BP 128/82 | HR 75 | Temp 98.1°F | Resp 18 | Ht 62.01 in | Wt 325.6 lb

## 2022-03-08 DIAGNOSIS — J3089 Other allergic rhinitis: Secondary | ICD-10-CM

## 2022-03-08 DIAGNOSIS — K219 Gastro-esophageal reflux disease without esophagitis: Secondary | ICD-10-CM

## 2022-03-08 DIAGNOSIS — L299 Pruritus, unspecified: Secondary | ICD-10-CM | POA: Diagnosis not present

## 2022-03-08 DIAGNOSIS — R062 Wheezing: Secondary | ICD-10-CM | POA: Diagnosis not present

## 2022-03-08 DIAGNOSIS — J302 Other seasonal allergic rhinitis: Secondary | ICD-10-CM

## 2022-03-08 MED ORDER — FLUTICASONE PROPIONATE HFA 110 MCG/ACT IN AERO: 2.0000 | INHALATION_SPRAY | Freq: Two times a day (BID) | RESPIRATORY_TRACT | 5 refills | Status: DC

## 2022-03-08 MED ORDER — BECLOMETHASONE DIPROP HFA 40 MCG/ACT IN AERB: 2.0000 | INHALATION_SPRAY | Freq: Two times a day (BID) | RESPIRATORY_TRACT | 5 refills | Status: DC

## 2022-03-08 MED ORDER — ARNUITY ELLIPTA 100 MCG/ACT IN AEPB: 1.0000 | INHALATION_SPRAY | Freq: Every day | RESPIRATORY_TRACT | 1 refills | Status: DC

## 2022-03-08 MED ORDER — EPINEPHRINE 0.3 MG/0.3ML IJ SOAJ: 0.3000 mg | INTRAMUSCULAR | 1 refills | Status: DC | PRN

## 2022-03-08 NOTE — Telephone Encounter (Signed)
Patient called stating she is unable to afford the Arnuity @ $60/Month. Patient is requesting a different medication.

## 2022-03-08 NOTE — Telephone Encounter (Signed)
Can you please try Qvar 40? Thank you

## 2022-03-08 NOTE — Telephone Encounter (Signed)
Called and left a message for patient to inform her of the change. Informed patient to call our office of any questions or concerns she may have.

## 2022-03-14 ENCOUNTER — Telehealth: Payer: Self-pay

## 2022-03-24 ENCOUNTER — Ambulatory Visit (INDEPENDENT_AMBULATORY_CARE_PROVIDER_SITE_OTHER): Payer: 59

## 2022-03-24 DIAGNOSIS — J309 Allergic rhinitis, unspecified: Secondary | ICD-10-CM | POA: Diagnosis not present

## 2022-04-14 ENCOUNTER — Ambulatory Visit (INDEPENDENT_AMBULATORY_CARE_PROVIDER_SITE_OTHER): Payer: 59

## 2022-04-14 DIAGNOSIS — J309 Allergic rhinitis, unspecified: Secondary | ICD-10-CM

## 2022-04-24 ENCOUNTER — Other Ambulatory Visit: Payer: Self-pay

## 2022-04-24 ENCOUNTER — Other Ambulatory Visit: Payer: Self-pay | Admitting: Allergy & Immunology

## 2022-04-24 MED ORDER — LEVOTHYROXINE SODIUM 200 MCG PO TABS: 200.0000 ug | ORAL_TABLET | Freq: Every day | ORAL | 0 refills | Status: DC

## 2022-05-01 ENCOUNTER — Inpatient Hospital Stay: Payer: 59 | Admitting: Hematology and Oncology

## 2022-05-02 ENCOUNTER — Inpatient Hospital Stay: Payer: 59 | Attending: Hematology and Oncology | Admitting: Hematology and Oncology

## 2022-05-02 ENCOUNTER — Encounter: Payer: Self-pay | Admitting: *Deleted

## 2022-05-02 ENCOUNTER — Other Ambulatory Visit: Payer: Self-pay

## 2022-05-02 VITALS — BP 147/98 | HR 83 | Temp 97.9°F | Resp 18 | Ht 62.01 in | Wt 322.6 lb

## 2022-05-02 DIAGNOSIS — E669 Obesity, unspecified: Secondary | ICD-10-CM

## 2022-05-02 DIAGNOSIS — Z803 Family history of malignant neoplasm of breast: Secondary | ICD-10-CM | POA: Insufficient documentation

## 2022-05-02 DIAGNOSIS — Z1501 Genetic susceptibility to malignant neoplasm of breast: Secondary | ICD-10-CM | POA: Diagnosis present

## 2022-05-02 MED ORDER — DESVENLAFAXINE SUCCINATE ER 100 MG PO TB24: 100.0000 mg | ORAL_TABLET | Freq: Every day | ORAL | Status: AC

## 2022-05-02 NOTE — Assessment & Plan Note (Addendum)
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  Lifetime risk of breast cancer in vary from 28% to 38%. Higher with family history of breast cancer.   Breast Cancer Surveillance: 1. Breast exam8/15/23: Normal 2. Mammogram9/27/2022: Benign breast density category B 3.  Breast MRI 07/07/2021: Benign.  Breast cancer risk reduction: She may decide to do bilateral mastectomies at some point in the future.  Total thyroidectomy 07/01/2020: Papillary thyroid carcinoma, classical variant, 1 cm, involving the lower pole of right lobe and left lobe without extrathyroidal extension, margins negative, negative for LVI, metastatic carcinoma in 2/2 lymph nodes focal extranodal extension is present Status post radioactive iodine therapy 01/18/2022: Ultrasound-guided FNA left neck mass: Benign follicular nodule  Obesity: We will refer her to healthy weight loss clinic.   Return to clinic in 1 year for follow-up

## 2022-05-02 NOTE — Progress Notes (Signed)
Per MD request RN successfully placed referral to healthy weight and wellness.

## 2022-05-02 NOTE — Progress Notes (Signed)
Patient Care Team: Celene Squibb, MD as PCP - General (Internal Medicine)  DIAGNOSIS:  Encounter Diagnoses  Name Primary?   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 with above-mentioned history of CHEK2 mutation who is at high risk for breast cancer. She presents to the clinic today for a follow-up. She states that everything is been ok still having pain in her right knee. She still have not got her knee injections.    ALLERGIES:  is allergic to penicillins.  MEDICATIONS:  Current Outpatient Medications  Medication Sig Dispense Refill   albuterol (VENTOLIN HFA) 108 (90 Base) MCG/ACT inhaler Inhale into the lungs.     atenolol (TENORMIN) 50 MG tablet Take 1 tablet (50 mg total) by mouth daily. (Patient taking differently: Take 50 mg by mouth at bedtime.)     beclomethasone (QVAR) 40 MCG/ACT inhaler Inhale 2 puffs into the lungs 2 (two) times daily. 11 g 5   Cholecalciferol (VITAMIN D3) 50 MCG (2000 UT) TABS Take 2,000 Units by mouth daily.     desvenlafaxine (PRISTIQ) 100 MG 24 hr tablet Take 1 tablet (100 mg total) by mouth daily.     doxepin (SINEQUAN) 25 MG capsule TAKE (2) CAPSULES BY MOUTH ONCE AT BEDTIME. 60 capsule 5   EPINEPHrine 0.3 mg/0.3 mL IJ SOAJ injection Inject 0.3 mg into the muscle as needed for anaphylaxis. 2 each 1   Fluticasone Furoate (ARNUITY ELLIPTA) 100 MCG/ACT AEPB Inhale 1 puff into the lungs daily at 6 (six) AM. 1 each 1   gabapentin (NEURONTIN) 300 MG capsule Take 1 capsule (300 mg total) by mouth at bedtime. 90 capsule 3   HYDROcodone-acetaminophen (NORCO/VICODIN) 5-325 MG tablet Take 1 tablet by mouth every 6 (six) hours as needed for moderate pain.     levocetirizine (XYZAL) 5 MG tablet Take 1  tablet (5 mg total) by mouth every evening. 90 tablet 1   levothyroxine (SYNTHROID) 200 MCG tablet Take 1 tablet (200 mcg total) by mouth daily. 90 tablet 0   LORazepam (ATIVAN) 1 MG tablet Take 1 mg by mouth at bedtime.      losartan (COZAAR) 100 MG tablet Take 100 mg by mouth daily.      Multiple Vitamin (MULTIVITAMIN) tablet Take 2 tablets by mouth daily. gummy     ondansetron (ZOFRAN ODT) 4 MG disintegrating tablet Take 1 tablet (4 mg total) by mouth every 8 (eight) hours as needed for nausea or vomiting. 10 tablet 0   oxybutynin (DITROPAN-XL) 10 MG 24 hr tablet Take by mouth.     pantoprazole (PROTONIX) 40 MG tablet Take 40 mg by mouth 2 (two) times daily.     rosuvastatin (CRESTOR) 10 MG tablet Take 10 mg by mouth daily.     zolpidem (AMBIEN) 10 MG tablet Take 10 mg by mouth at bedtime as needed.     No current facility-administered medications for this visit.    PHYSICAL EXAMINATION: ECOG PERFORMANCE STATUS: 1 - Symptomatic but completely ambulatory  Vitals:   05/02/22 1203  BP: (!) 147/98  Pulse: 83  Resp: 18  Temp: 97.9 F (36.6 C)  SpO2: 95%   Filed Weights   05/02/22 1203  Weight: (!) 322 lb 9.6 oz (146.3 kg)      LABORATORY DATA:  I have reviewed the data as listed    Latest Ref Rng & Units 01/12/2021    3:47 PM 12/21/2020    6:01 AM 07/26/2020    3:37 PM  CMP  Glucose 65 - 99 mg/dL 83  114  101   BUN 6 - 24 mg/dL 13  12  13    Creatinine 0.57 - 1.00 mg/dL 0.70  0.77  0.70   Sodium 134 - 144 mmol/L 142  140  137   Potassium 3.5 - 5.2 mmol/L 4.1  2.8  3.6   Chloride 96 - 106 mmol/L 99  103  100   CO2 20 - 29 mmol/L 25  28  28    Calcium 8.7 - 10.2 mg/dL 9.9  8.9  9.3   Total Protein 6.5 - 8.1 g/dL  7.3    Total Bilirubin 0.3 - 1.2 mg/dL  0.6    Alkaline Phos 38 - 126 U/L  92    AST 15 - 41 U/L  61    ALT 0 - 44 U/L  173      Lab Results  Component Value Date   WBC 7.7 12/21/2020   HGB 14.4 12/21/2020   HCT 43.3 12/21/2020   MCV 95.2 12/21/2020   PLT  305 12/21/2020   NEUTROABS 5.5 12/21/2020    ASSESSMENT & PLAN:  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  Lifetime risk of breast cancer in vary from 28% to 38%. Higher with family history of breast cancer.    Breast Cancer Surveillance: 1. Breast exam 05/02/22: Normal 2. Mammogram 06/14/2021: Benign breast density category B 3.  Breast MRI 07/07/2021: Benign.   Breast cancer risk reduction: She may decide to do bilateral mastectomies at some point in the future.   Total thyroidectomy 07/01/2020: Papillary thyroid carcinoma, classical variant, 1 cm, involving the lower pole of right lobe and left lobe without extrathyroidal extension, margins negative, negative for LVI, metastatic carcinoma in 2/2 lymph nodes focal extranodal extension is present Status post radioactive iodine therapy 01/18/2022: Ultrasound-guided FNA left neck mass: Benign follicular nodule   Obesity: We will refer her to healthy weight loss clinic.     Return to clinic in 1 year for follow-up    Orders Placed This Encounter  Procedures   MR BREAST BILATERAL W [place] CAD    Standing Status:   Future    Standing Expiration Date:   05/03/2023    Order Specific Question:   If indicated for the ordered procedure, I authorize the administration of contrast media per Radiology protocol    Answer:   Yes    Order Specific Question:   What is the patient's sedation requirement?    Answer:   No Sedation    Order Specific Question:   Does the patient have a pacemaker or implanted devices?    Answer:   No    Order Specific Question:   Preferred imaging location?    Answer:   [facility] (table limit-550lbs)   Amb Ref to Medical Weight Management    Referral Priority:   Routine    Referral Type:   Consultation     Number of Visits Requested:   1   The patient has a good understanding of the overall plan. she agrees with it. she will call with  any problems that may develop before the next visit here. Total time spent: 30 mins including face to face time and time spent for planning, charting and co-ordination of care   Harriette Ohara, MD 05/02/22    I Gardiner Coins am scribing for Dr. Lindi Adie  I have reviewed the above documentation for accuracy and completeness, and I agree with the above.

## 2022-05-03 ENCOUNTER — Other Ambulatory Visit (HOSPITAL_COMMUNITY): Payer: Self-pay | Admitting: Internal Medicine

## 2022-05-03 DIAGNOSIS — Z1231 Encounter for screening mammogram for malignant neoplasm of breast: Secondary | ICD-10-CM

## 2022-05-03 NOTE — Progress Notes (Signed)
VIALS EXP 05-04-23

## 2022-05-04 DIAGNOSIS — J3089 Other allergic rhinitis: Secondary | ICD-10-CM | POA: Diagnosis not present

## 2022-05-17 DIAGNOSIS — Z0289 Encounter for other administrative examinations: Secondary | ICD-10-CM

## 2022-05-22 ENCOUNTER — Ambulatory Visit
Admission: RE | Admit: 2022-05-22 | Discharge: 2022-05-22 | Disposition: A | Discharge status: Home / Self Care | Payer: 59 | Source: Ambulatory Visit | Attending: Family Medicine | Admitting: Family Medicine

## 2022-05-22 ENCOUNTER — Ambulatory Visit (INDEPENDENT_AMBULATORY_CARE_PROVIDER_SITE_OTHER): Payer: 59

## 2022-05-22 VITALS — BP 144/88 | HR 72 | Temp 97.7°F | Resp 18

## 2022-05-22 DIAGNOSIS — R0789 Other chest pain: Secondary | ICD-10-CM | POA: Diagnosis not present

## 2022-05-22 NOTE — Discharge Instructions (Signed)
You may use heating pads, IcyHot rub for your pain.  Follow-up with your primary care provider for a recheck if not resolving.  X-ray looked very reassuring today.

## 2022-05-22 NOTE — ED Triage Notes (Signed)
Pt presents with knot on sternum that is painful to touch . Pt discovered 2 days ago

## 2022-05-22 NOTE — ED Provider Notes (Signed)
[facility]    CSN: [id number] Arrival date & time: 05/22/22  0946      History   Chief Complaint Chief Complaint  Patient presents with   Chest Injury    knot in between breast - Entered by patient   Appointment    HPI Christine Mason is a 51 y.o. female.   Patient presenting today concerned about a painful knot that she has started feeling the past 2 days on the lower part of her sternum.  She denies any injury to this area, redness, swelling, fevers, chest pain, shortness of breath, palpitations.  She has not been trying anything over-the-counter for symptoms.  Her main concern is a family history of breast cancer.  Has mammogram coming up in the next few weeks, has had normal mammograms in the past.    Past Medical History:  Diagnosis Date   Anxiety    Carpal tunnel syndrome    bi lat hands   Chronic leg pain    right   Chronic pain of left knee    Depression    Diverticulitis    GERD (gastroesophageal reflux disease)    Heart murmur    History of kidney stones    Hypertension    Hyperthyroidism    Sleep apnea     no C- Pap    Patient Active Problem List   Diagnosis Date Noted   Seasonal and perennial allergic rhinitis 03/08/2022   Wheeze 03/08/2022   Gastroesophageal reflux disease 03/08/2022   Hx of papillary thyroid carcinoma 04/14/2021   Status post total thyroidectomy 04/14/2021   Unilateral primary osteoarthritis, left knee 03/24/2021   Chronic pain of left knee 03/24/2021   Morbid obesity ([place]) 02/09/2021   Abnormal liver function tests 02/09/2021   Chest pain 02/09/2021   Chronic low back pain 02/09/2021   Generalized hyperhidrosis 02/09/2021   Major depressive disorder 02/09/2021   Palpitations 02/09/2021   Urinary incontinence 02/09/2021   Impaired fasting glucose 02/01/2021   Essential hypertension 02/01/2021   Mixed hyperlipidemia 02/01/2021   Vitamin D deficiency 02/01/2021   Hypothyroidism 01/12/2021   Hypokalemia  01/12/2021   Papillary thyroid carcinoma ([place]) 07/14/2020   Neoplasm of uncertain behavior of thyroid gland 06/29/2020   Oropharyngeal dysphagia 05/06/2019   Dysphagia, pharyngoesophageal phase 05/05/2019   Vasomotor symptoms due to menopause 03/13/2019   Genetic testing 06/03/2015   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    Past Surgical History:  Procedure Laterality Date   ACNE CYST REMOVAL     CHOLECYSTECTOMY     DILATION AND CURETTAGE OF UTERUS  1998   ESOPHAGOGASTRODUODENOSCOPY (EGD) WITH PROPOFOL N/A 06/13/2019   Procedure: ESOPHAGOGASTRODUODENOSCOPY (EGD) WITH PROPOFOL;  Surgeon: Rogene Houston, MD;  Location: [facility];  Service: Endoscopy;  Laterality: N/A;  7:30   KNEE SURGERY     MALONEY DILATION  06/13/2019   Procedure: MALONEY DILATION;  Surgeon: Rogene Houston, MD;  Location: [facility];  Service: Endoscopy;;   MASS EXCISION     THORACOTOMY Right 2016   THYROIDECTOMY N/A 07/01/2020   Procedure: TOTAL THYROIDECTOMY WITH Central compartment lymph node disection;  Surgeon: Armandina Gemma, MD;  Location: [facility];  Service: General;  Laterality: N/A;    OB History     Gravida  1   Para      Term      Preterm      AB  1   Living  0      SAB  1   IAB      Ectopic      Multiple      Live Births               Home Medications    Prior to Admission medications   Medication Sig Start Date End Date Taking? Authorizing Provider  albuterol (VENTOLIN HFA) 108 (90 Base) MCG/ACT inhaler Inhale into the lungs. 02/23/22   [provider]  atenolol (TENORMIN) 50 MG tablet Take 1 tablet (50 mg total) by mouth daily. Patient taking differently: Take 50 mg by mouth at bedtime. 04/19/18   Nicholas Lose, MD  beclomethasone (QVAR) 40 MCG/ACT inhaler Inhale 2 puffs into the lungs 2 (two) times daily. 03/08/22   Valentina Shaggy, MD  Cholecalciferol (VITAMIN D3) 50 MCG (2000 UT) TABS Take 2,000 Units by mouth daily.     [provider]  desvenlafaxine (PRISTIQ) 100 MG 24 hr tablet Take 1 tablet (100 mg total) by mouth daily. 05/02/22   Nicholas Lose, MD  doxepin (SINEQUAN) 25 MG capsule TAKE (2) CAPSULES BY MOUTH ONCE AT BEDTIME. 07/06/21   Ambs, Kathrine Cords, FNP  EPINEPHrine 0.3 mg/0.3 mL IJ SOAJ injection Inject 0.3 mg into the muscle as needed for anaphylaxis. 03/08/22   Ambs, Kathrine Cords, FNP  Fluticasone Furoate (ARNUITY ELLIPTA) 100 MCG/ACT AEPB Inhale 1 puff into the lungs daily at 6 (six) AM. 03/08/22   Ambs, Kathrine Cords, FNP  gabapentin (NEURONTIN) 300 MG capsule Take 1 capsule (300 mg total) by mouth at bedtime. 02/27/22   Carole Civil, MD  HYDROcodone-acetaminophen (NORCO/VICODIN) 5-325 MG tablet Take 1 tablet by mouth every 6 (six) hours as needed for moderate pain. 12/11/20   [provider]  levocetirizine (XYZAL) 5 MG tablet Take 1 tablet (5 mg total) by mouth every evening. 04/24/22   Valentina Shaggy, MD  levothyroxine (SYNTHROID) 200 MCG tablet Take 1 tablet (200 mcg total) by mouth daily. 04/24/22   Shamleffer, Melanie Crazier, MD  LORazepam (ATIVAN) 1 MG tablet Take 1 mg by mouth at bedtime.  04/02/20   [provider]  losartan (COZAAR) 100 MG tablet Take 100 mg by mouth daily.  12/09/18   [provider]  Multiple Vitamin (MULTIVITAMIN) tablet Take 2 tablets by mouth daily. gummy    [provider]  ondansetron (ZOFRAN ODT) 4 MG disintegrating tablet Take 1 tablet (4 mg total) by mouth every 8 (eight) hours as needed for nausea or vomiting. 12/21/20   Noemi Chapel, MD  oxybutynin (DITROPAN-XL) 10 MG 24 hr tablet Take by mouth. 02/14/22   [provider]  pantoprazole (PROTONIX) 40 MG tablet Take 40 mg by mouth 2 (two) times daily.    [provider]  rosuvastatin (CRESTOR) 10 MG tablet Take 10 mg by mouth daily. 11/09/20   [provider]  zolpidem (AMBIEN) 10 MG tablet Take 10 mg by mouth at bedtime as needed. 03/02/22   [provider]    Family History Family History  Problem Relation Age of Onset   Diabetes Mother    Hypertension Mother    Breast cancer Mother 59       dx again at 9; PALB2 and CHEK2 positive   Diabetes Father    Hypertension Father    Coronary artery disease Father    Hypertension Sister    Breast cancer Sister        diagnosed in her 43's   Thyroid cancer Maternal Uncle 61  medullary   Lung cancer Maternal Grandmother    Leukemia Maternal Grandfather 75       dx with hairy cell leukemia at 83; CLL at 94   Lymphoma Maternal Grandfather 82       hodgkins lymphoma   Brain cancer Paternal Grandmother    Prostate cancer Paternal Grandfather    Lung cancer Maternal Aunt 57   Lymphoma Maternal Uncle 60       follicular lymphoma   Allergic rhinitis Neg Hx    Asthma Neg Hx     Social History Social History   Tobacco Use   Smoking status: Former    Packs/day: 0.50    Years: 5.00    Total pack years: 2.50    Types: Cigarettes    Quit date: 09/15/2017    Years since quitting: 4.6   Smokeless tobacco: Never  Vaping Use   Vaping Use: Never used  Substance Use Topics   Alcohol use: No   Drug use: No     Allergies   Penicillins   Review of Systems Review of Systems Per HPI  Physical Exam Triage Vital Signs ED Triage Vitals  Enc Vitals Group     BP 05/22/22 1042 (!) 144/88     Pulse Rate 05/22/22 1042 72     Resp 05/22/22 1042 18     Temp 05/22/22 1042 97.7 F (36.5 C)     Temp src --      SpO2 05/22/22 1042 95 %     Weight --      Height --      Head Circumference --      Peak Flow --      Pain Score 05/22/22 1039 8     Pain Loc --      Pain Edu? --      Excl. in [place]? --    No data found.  Updated Vital Signs BP (!) 144/88   Pulse 72   Temp 97.7 F (36.5 C)   Resp 18   LMP 05/22/2015   SpO2 95%   Visual Acuity Right Eye Distance:   Left Eye Distance:   Bilateral Distance:    Right Eye Near:   Left Eye Near:    Bilateral Near:      Physical Exam Vitals and nursing note reviewed.  Constitutional:      Appearance: Normal appearance. She is not ill-appearing.  HENT:     Head: Atraumatic.     Mouth/Throat:     Mouth: Mucous membranes are moist.  Eyes:     Extraocular Movements: Extraocular movements intact.     Conjunctiva/sclera: Conjunctivae normal.  Cardiovascular:     Rate and Rhythm: Normal rate and regular rhythm.     Heart sounds: Normal heart sounds.  Pulmonary:     Effort: Pulmonary effort is normal.     Breath sounds: Normal breath sounds. No wheezing or rales.  Musculoskeletal:        General: Tenderness present. Normal range of motion.     Cervical back: Normal range of motion and neck supple.     Comments: Minimal tenderness to palpation at the base of patient's sternum, on palpation most consistent with xiphoid process.  No appreciable masses, edema, discoloration  Skin:    General: Skin is warm and dry.     Findings: No bruising or erythema.  Neurological:     Mental Status: She is alert and oriented to person, place, and time.  Psychiatric:  Mood and Affect: Mood normal.        Thought Content: Thought content normal.        Judgment: Judgment normal.      UC Treatments / Results  Labs (all labs ordered are listed, but only abnormal results are displayed) Labs Reviewed - No data to display  EKG   Radiology DG Sternum  Result Date: 05/22/2022 CLINICAL DATA:  Knot on sternum, painful to touch, discovered 2 days ago. EXAM: STERNUM - 2+ VIEW COMPARISON:  CT cardiac, 04/01/2021. FINDINGS: No fracture.  No bone lesion. Soft tissues anterior to the sternum are unremarkable. No visualized mass. IMPRESSION: Negative. Electronically Signed   By: Lajean Manes M.D.   On: 05/22/2022 11:50    Procedures Procedures (including critical care time)  Medications Ordered in UC Medications - No data to display  Initial Impression / Assessment and Plan / UC Course  I have reviewed the  triage vital signs and the nursing notes.  Pertinent labs & imaging results that were available during my care of the patient were reviewed by me and considered in my medical decision making (see chart for details).     X-ray negative for acute bony or soft tissue abnormalities, discussed with patient to apply warm compresses to the area, IcyHot and if not resolving follow-up with primary care provider for consideration of a soft tissue ultrasound for further evaluation.  Continue with mammogram as scheduled.  Return for any worsening symptoms.  Final Clinical Impressions(s) / UC Diagnoses   Final diagnoses:  Sternal pain     Discharge Instructions      You may use heating pads, IcyHot rub for your pain.  Follow-up with your primary care provider for a recheck if not resolving.  X-ray looked very reassuring today.    ED Prescriptions   None    PDMP not reviewed this encounter.   Volney American, [place] 05/22/22 1218

## 2022-06-02 ENCOUNTER — Ambulatory Visit (INDEPENDENT_AMBULATORY_CARE_PROVIDER_SITE_OTHER): Payer: 59

## 2022-06-02 DIAGNOSIS — J309 Allergic rhinitis, unspecified: Secondary | ICD-10-CM

## 2022-06-07 ENCOUNTER — Ambulatory Visit (INDEPENDENT_AMBULATORY_CARE_PROVIDER_SITE_OTHER): Payer: 59

## 2022-06-07 DIAGNOSIS — J309 Allergic rhinitis, unspecified: Secondary | ICD-10-CM

## 2022-06-16 ENCOUNTER — Ambulatory Visit (INDEPENDENT_AMBULATORY_CARE_PROVIDER_SITE_OTHER): Payer: 59

## 2022-06-16 ENCOUNTER — Ambulatory Visit (HOSPITAL_COMMUNITY)
Admission: RE | Admit: 2022-06-16 | Discharge: 2022-06-16 | Disposition: A | Discharge status: Home / Self Care | Payer: 59 | Source: Ambulatory Visit | Attending: Internal Medicine | Admitting: Internal Medicine

## 2022-06-16 DIAGNOSIS — J309 Allergic rhinitis, unspecified: Secondary | ICD-10-CM | POA: Diagnosis not present

## 2022-06-16 DIAGNOSIS — Z1231 Encounter for screening mammogram for malignant neoplasm of breast: Secondary | ICD-10-CM | POA: Diagnosis not present

## 2022-06-27 ENCOUNTER — Ambulatory Visit (INDEPENDENT_AMBULATORY_CARE_PROVIDER_SITE_OTHER): Payer: 59 | Admitting: Family Medicine

## 2022-06-27 ENCOUNTER — Encounter (INDEPENDENT_AMBULATORY_CARE_PROVIDER_SITE_OTHER): Payer: Self-pay | Admitting: Family Medicine

## 2022-06-27 VITALS — HR 58 | Temp 98.1°F | Ht 62.0 in | Wt 315.0 lb

## 2022-06-27 DIAGNOSIS — R7303 Prediabetes: Secondary | ICD-10-CM | POA: Diagnosis not present

## 2022-06-27 DIAGNOSIS — R5383 Other fatigue: Secondary | ICD-10-CM | POA: Diagnosis not present

## 2022-06-27 DIAGNOSIS — I10 Essential (primary) hypertension: Secondary | ICD-10-CM

## 2022-06-27 DIAGNOSIS — E669 Obesity, unspecified: Secondary | ICD-10-CM

## 2022-06-27 DIAGNOSIS — R0602 Shortness of breath: Secondary | ICD-10-CM | POA: Diagnosis not present

## 2022-06-27 DIAGNOSIS — M170 Bilateral primary osteoarthritis of knee: Secondary | ICD-10-CM

## 2022-06-27 DIAGNOSIS — Z1331 Encounter for screening for depression: Secondary | ICD-10-CM | POA: Diagnosis not present

## 2022-06-27 DIAGNOSIS — C73 Malignant neoplasm of thyroid gland: Secondary | ICD-10-CM

## 2022-06-27 DIAGNOSIS — E7849 Other hyperlipidemia: Secondary | ICD-10-CM

## 2022-06-27 DIAGNOSIS — Z6841 Body Mass Index (BMI) 40.0 and over, adult: Secondary | ICD-10-CM

## 2022-06-29 LAB — CBC WITH DIFFERENTIAL/PLATELET
Basophils Absolute: 0 10*3/uL (ref 0.0–0.2)
Basos: 1 %
EOS (ABSOLUTE): 0.1 10*3/uL (ref 0.0–0.4)
Eos: 1 %
Hematocrit: 41.8 % (ref 34.0–46.6)
Hemoglobin: 13.3 g/dL (ref 11.1–15.9)
Immature Grans (Abs): 0 10*3/uL (ref 0.0–0.1)
Immature Granulocytes: 0 %
Lymphocytes Absolute: 2.4 10*3/uL (ref 0.7–3.1)
Lymphs: 37 %
MCH: 29.8 pg (ref 26.6–33.0)
MCHC: 31.8 g/dL (ref 31.5–35.7)
MCV: 94 fL (ref 79–97)
Monocytes Absolute: 0.5 10*3/uL (ref 0.1–0.9)
Monocytes: 7 %
Neutrophils Absolute: 3.5 10*3/uL (ref 1.4–7.0)
Neutrophils: 54 %
Platelets: 332 10*3/uL (ref 150–450)
RBC: 4.46 x10E6/uL (ref 3.77–5.28)
RDW: 13.1 % (ref 11.7–15.4)
WBC: 6.6 10*3/uL (ref 3.4–10.8)

## 2022-06-29 LAB — COMPREHENSIVE METABOLIC PANEL
ALT: 33 IU/L — ABNORMAL HIGH (ref 0–32)
AST: 28 IU/L (ref 0–40)
Albumin/Globulin Ratio: 1.8 (ref 1.2–2.2)
Albumin: 4.8 g/dL (ref 3.8–4.9)
Alkaline Phosphatase: 76 IU/L (ref 44–121)
BUN/Creatinine Ratio: 18 (ref 9–23)
BUN: 14 mg/dL (ref 6–24)
Bilirubin Total: 0.4 mg/dL (ref 0.0–1.2)
CO2: 22 mmol/L (ref 20–29)
Calcium: 9.7 mg/dL (ref 8.7–10.2)
Chloride: 99 mmol/L (ref 96–106)
Creatinine, Ser: 0.77 mg/dL (ref 0.57–1.00)
Globulin, Total: 2.7 g/dL (ref 1.5–4.5)
Glucose: 85 mg/dL (ref 70–99)
Potassium: 4.5 mmol/L (ref 3.5–5.2)
Sodium: 140 mmol/L (ref 134–144)
Total Protein: 7.5 g/dL (ref 6.0–8.5)
eGFR: 93 mL/min/{1.73_m2} (ref >59)

## 2022-06-29 LAB — LIPID PANEL
Chol/HDL Ratio: 2.8 ratio (ref 0.0–4.4)
Cholesterol, Total: 174 mg/dL (ref 100–199)
HDL: 63 mg/dL (ref >39)
LDL Chol Calc (NIH): 89 mg/dL (ref 0–99)
Triglycerides: 126 mg/dL (ref 0–149)
VLDL Cholesterol Cal: 22 mg/dL (ref 5–40)

## 2022-06-29 LAB — TSH: TSH: 3.34 u[IU]/mL (ref 0.450–4.500)

## 2022-06-29 LAB — T4, FREE: Free T4: 1.56 ng/dL (ref 0.82–1.77)

## 2022-06-29 LAB — HEMOGLOBIN A1C
Est. average glucose Bld gHb Est-mCnc: 114 mg/dL
Hgb A1c MFr Bld: 5.6 % (ref 4.8–5.6)

## 2022-06-29 LAB — VITAMIN D 25 HYDROXY (VIT D DEFICIENCY, FRACTURES): Vit D, 25-Hydroxy: 36.2 ng/mL (ref 30.0–100.0)

## 2022-06-29 LAB — VITAMIN B12: Vitamin B-12: 438 pg/mL (ref 232–1245)

## 2022-06-29 LAB — INSULIN, RANDOM: INSULIN: 13.5 u[IU]/mL (ref 2.6–24.9)

## 2022-07-03 ENCOUNTER — Other Ambulatory Visit: Payer: Self-pay | Admitting: Allergy & Immunology

## 2022-07-05 ENCOUNTER — Ambulatory Visit (INDEPENDENT_AMBULATORY_CARE_PROVIDER_SITE_OTHER): Payer: 59

## 2022-07-05 DIAGNOSIS — J309 Allergic rhinitis, unspecified: Secondary | ICD-10-CM | POA: Diagnosis not present

## 2022-07-05 NOTE — Progress Notes (Unsigned)
Chief Complaint:   OBESITY Christine Mason (MR# [id number]) is a 51 y.o. female who presents for evaluation and treatment of obesity and related comorbidities. Current BMI is Body mass index is 57.61 kg/m. Christine Mason has been struggling with her weight for many years and has been unsuccessful in either losing weight, maintaining weight loss, or reaching her healthy weight goal.  Christine Mason gained weight after high school with slow increase.  She has lost weight a few times only to regain it back.  She did well with Wegovy for a short time.  She has a history of obesity.  She tried phentermine, Saxenda, Contrave.  Her husband is not very supportive.  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  She has been sitting more at work and tends to boredom snack at home.  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  She avoids SSB's.   Christine Mason is currently in the action stage of change and ready to dedicate time achieving and maintaining a healthier weight. Christine Mason is interested in becoming our patient and working on intensive lifestyle modifications including (but not limited to) diet and exercise for weight loss.  Christine Mason's habits were reviewed today and are as follows: Her family eats meals together, she thinks her family will eat healthier with her, her desired weight loss is 115 lbs, she started gaining weight she does not know when she started gaining weight, her heaviest weight ever was 315 pounds, she is a picky eater and doesn't like to eat healthier foods, she has significant food cravings issues, she snacks frequently in the evenings, she is frequently drinking liquids with calories, she frequently makes poor food choices, she has problems with excessive hunger, she frequently eats larger portions than normal, she has binge eating behaviors, and she struggles with emotional eating.  Depression Screen Roni's Food and Mood (modified PHQ-9) score was 27.     06/27/2022    8:58 AM  Depression  screen PHQ 2/9  Decreased Interest 3  Down, Depressed, Hopeless 3  PHQ - 2 Score 6  Altered sleeping 3  Tired, decreased energy 3  Change in appetite 3  Feeling bad or failure about yourself  3  Trouble concentrating 3  Moving slowly or fidgety/restless 3  Suicidal thoughts 3  PHQ-9 Score 27  Difficult doing work/chores Very difficult   Subjective:   1. Other fatigue She complains of poor sleep and is non compliant with CPAP.  She has night sweats that interrupt her sleep.  EKG and IC reviewed.   Christine Mason admits to daytime somnolence and admits to waking up still tired. Patient has a history of symptoms of none. Christine Mason generally gets about (unable to calculate)  hours of sleep per night, and states that she has generally restful sleep. Snoring is present. Apneic episodes are not present. Epworth Sleepiness Score is 13.   2. SOBOE (shortness of breath on exertion) She complains of poor sleep and is non compliant with CPAP.  She has night sweats that interrupt her sleep.  EKG and IC reviewed.   Christine Mason notes increasing shortness of breath with exercising and seems to be worsening over time with weight gain. She notes getting out of breath sooner with activity than she used to. This has not gotten worse recently. Christine Mason denies shortness of breath at rest or orthopnea.  3. Osteoarthritis of both knees, unspecified osteoarthritis type She would like to walk more.  Complains of bilateral knee pain.  She needs BMI  of <40 for total knee replacement.  She has an exercise bike at home.  She uses hydrocodone at night for pain.   4. Essential hypertension She is on Losartan 100 mg daily and atenolol 50 mg daily. Her blood pressure is well controlled and she denies chest pain.    5. Pre-diabetes She craves sugar and starchy foods. She is not on any medications.   6. Other hyperlipidemia She is taking Crestor 10 mg daily.  She denies any myalgias.    7. 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  She is taking levothyroxine 200 mcg daily.  She is seeing Dr Phoebe Perch.   Assessment/Plan:   1. Other fatigue Domino does feel that her weight is causing her energy to be lower than it should be. Fatigue may be related to obesity, depression or many other causes. Labs will be ordered, and in the meanwhile, Rhia will focus on self care including making healthy food choices, increasing physical activity and focusing on stress reduction.  Update labs today.   - EKG 12-Lead - VITAMIN D 25 Hydroxy (Vit-D Deficiency, Fractures) - TSH - T4, free - Comprehensive metabolic panel - Vitamin I77 - CBC with Differential/Platelet  2. SOBOE (shortness of breath on exertion) Christine Mason does feel that she gets out of breath more easily that she used to when she exercises. Aimie's shortness of breath appears to be obesity related and exercise induced. She has agreed to work on weight loss and gradually increase exercise to treat her exercise induced shortness of breath. Will continue to monitor closely.  3. Osteoarthritis of both knees, unspecified osteoarthritis type Start taking short walks outside and the use of indoor bike encouraged.   4. Essential hypertension Continue current blood pressure medications per PCP.   5. Pre-diabetes Check labs today.    - Insulin, random - Hemoglobin A1c  6. Other hyperlipidemia Check labs today.    -Lipid panel  7. Papillary thyroid carcinoma (HCC) Continue levothyroxine per endocrinology.   8. Depression screening Christine Mason had a positive depression screening. Depression is commonly associated with obesity and often results in emotional eating behaviors. We will monitor this closely and work on CBT to help improve the non-hunger eating patterns. Referral to Psychology may be required if no improvement is seen as she continues in our clinic.  9. Obesity,current BMI 57.7 Christine Mason is currently in the action stage of change  and her goal is to continue with weight loss efforts. I recommend Christine Mason begin the structured treatment plan as follows:  She has agreed to the Category 3 Plan+100 protein daily.  Exercise goals:  Start exercise bike.    Behavioral modification strategies: increasing lean protein intake, increasing vegetables, increasing water intake, decreasing eating out, no skipping meals, meal planning and cooking strategies, keeping healthy foods in the home, and decreasing junk food.  She was informed of the importance of frequent follow-up visits to maximize her success with intensive lifestyle modifications for her multiple health conditions. She was informed we would discuss her lab results at her next visit unless there is a critical issue that needs to be addressed sooner. Christine Mason agreed to keep her next visit at the agreed upon time to discuss these results.  Objective:   Pulse (!) 58, temperature 98.1 F (36.7 C), height '5\' 2"'$  (1.575 m), weight (!) 315 lb (142.9 kg), last menstrual period 05/22/2015, SpO2 95 %. Body mass index is 57.61 kg/m.  EKG: Normal sinus rhythm, rate 60.  Indirect Calorimeter completed today  shows a VO2 of 311 and a REE of 2146.  Her calculated basal metabolic rate is 6568 thus her basal metabolic rate is better than expected.  General: Cooperative, alert, well developed, in no acute distress. HEENT: Conjunctivae and lids unremarkable. Cardiovascular: Regular rhythm.  Lungs: Normal work of breathing. Neurologic: No focal deficits.   Lab Results  Component Value Date   CREATININE 0.77 06/27/2022   BUN 14 06/27/2022   NA 140 06/27/2022   K 4.5 06/27/2022   CL 99 06/27/2022   CO2 22 06/27/2022   Lab Results  Component Value Date   ALT 33 (H) 06/27/2022   AST 28 06/27/2022   ALKPHOS 76 06/27/2022   BILITOT 0.4 06/27/2022   Lab Results  Component Value Date   HGBA1C 5.6 06/27/2022   Lab Results  Component Value Date   INSULIN 13.5 06/27/2022   Lab  Results  Component Value Date   TSH 3.340 06/27/2022   Lab Results  Component Value Date   CHOL 174 06/27/2022   HDL 63 06/27/2022   LDLCALC 89 06/27/2022   TRIG 126 06/27/2022   CHOLHDL 2.8 06/27/2022   Lab Results  Component Value Date   WBC 6.6 06/27/2022   HGB 13.3 06/27/2022   HCT 41.8 06/27/2022   MCV 94 06/27/2022   PLT 332 06/27/2022   No results found for: "IRON", "TIBC", "FERRITIN"  Attestation Statements:   Reviewed by clinician on day of visit: allergies, medications, problem list, medical history, surgical history, family history, social history, and previous encounter notes.  Time spent on visit including pre-visit chart review and post-visit charting and care was 60 minutes.   I, Davy Pique, am acting as Location manager for [name] Gambler, DO.  I have reviewed the above documentation for accuracy and completeness, and I agree with the above. Dell Ponto, DO

## 2022-07-11 ENCOUNTER — Encounter (INDEPENDENT_AMBULATORY_CARE_PROVIDER_SITE_OTHER): Payer: Self-pay | Admitting: Family Medicine

## 2022-07-11 ENCOUNTER — Other Ambulatory Visit (HOSPITAL_COMMUNITY): Payer: Self-pay

## 2022-07-11 ENCOUNTER — Ambulatory Visit (INDEPENDENT_AMBULATORY_CARE_PROVIDER_SITE_OTHER): Payer: 59 | Admitting: Family Medicine

## 2022-07-11 VITALS — BP 135/84 | HR 67 | Temp 97.9°F | Ht 62.0 in | Wt 315.0 lb

## 2022-07-11 DIAGNOSIS — F32A Depression, unspecified: Secondary | ICD-10-CM | POA: Insufficient documentation

## 2022-07-11 DIAGNOSIS — E559 Vitamin D deficiency, unspecified: Secondary | ICD-10-CM

## 2022-07-11 DIAGNOSIS — E669 Obesity, unspecified: Secondary | ICD-10-CM

## 2022-07-11 DIAGNOSIS — Z6841 Body Mass Index (BMI) 40.0 and over, adult: Secondary | ICD-10-CM

## 2022-07-11 DIAGNOSIS — F3289 Other specified depressive episodes: Secondary | ICD-10-CM | POA: Diagnosis not present

## 2022-07-11 DIAGNOSIS — Z85850 Personal history of malignant neoplasm of thyroid: Secondary | ICD-10-CM | POA: Diagnosis not present

## 2022-07-11 MED ORDER — VITAMIN D (ERGOCALCIFEROL) 1.25 MG (50000 UNIT) PO CAPS: 50000.0000 [IU] | ORAL_CAPSULE | ORAL | 0 refills | Status: DC

## 2022-07-11 MED ORDER — WEGOVY 0.25 MG/0.5ML ~~LOC~~ SOAJ
0.2500 mg | SUBCUTANEOUS | 0 refills | Status: DC
  Filled 2022-07-11: qty 2, 28d supply, fill #0

## 2022-07-11 NOTE — Progress Notes (Signed)
Office: [phone]  /  Fax: [phone]    Date: 07/18/2022   Appointment Start Time: 11:44am Duration: 59 minutes Provider: Glennie Mason, Psy.D. Type of Session: Intake for Individual Therapy  Location of Patient: Home (private location) Location of Provider: Provider's home (private office) Type of Contact: Telepsychological Visit via MyChart Video Visit  Informed Consent: Prior to proceeding with today's appointment, two pieces of identifying information were obtained. In addition, Christine Mason's physical location at the time of this appointment was obtained as well a phone number she could be reached at in the event of technical difficulties. Christine Mason and this provider participated in today's telepsychological service.   The provider's role was explained to Christine Mason. The provider reviewed and discussed issues of confidentiality, privacy, and limits therein (e.g., reporting obligations). In addition to verbal informed consent, written informed consent for psychological services was obtained prior to the initial appointment. Since the clinic is not a 24/7 crisis center, mental health emergency resources were shared and this  provider explained MyChart, e-mail, voicemail, and/or other messaging systems should be utilized only for non-emergency reasons. This provider also explained that information obtained during appointments will be placed in Christine Mason's medical record and relevant information will be shared with other providers at [facility] for coordination of care. Christine Mason agreed information may be shared with other [facility] providers as needed for coordination of care and by signing the service agreement document, she provided written consent for coordination of care. Prior to initiating telepsychological services, Christine Mason completed an informed consent document, which included the development of a safety plan (i.e., an emergency contact and emergency  resources) in the event of an emergency/crisis. Christine Mason verbally acknowledged understanding she is ultimately responsible for understanding her insurance benefits for telepsychological and in-person services. This provider also reviewed confidentiality, as it relates to telepsychological services. Christine Mason  acknowledged understanding that appointments cannot be recorded without both party consent and she is aware she is responsible for securing confidentiality on her end of the session. Christine Mason verbally consented to proceed.  Christine Mason stated her husband Christine Mason) was present in the room. Limits of confidentiality related to having someone else present were discussed. She acknowledged understanding and provided verbal consent to proceed.   Chief Complaint/HPI: Christine Mason was referred by Dr. [name] Mason due to other depression, with emotional eating on July 11, 2022. The note for the initial appointment on June 27, 2022 with Dr. [name] Mason indicated the following: "Mignonne's habits were reviewed today and are as follows: Her family eats meals together, she thinks her family will eat healthier with her, her desired weight loss is 115 lbs, she started gaining weight she does not know when she started gaining weight, her heaviest weight ever was 315 pounds, she is a picky eater and doesn't like to eat healthier foods, she has significant food cravings issues, she snacks frequently in the evenings, she is frequently drinking liquids with calories, she frequently makes poor food choices, she has problems with excessive hunger, she frequently eats larger portions than normal, she has binge eating behaviors, and she struggles with emotional eating" Christine Mason's Food and Mood (modified PHQ-9) score on June 27, 2022 was 27.  During today's appointment, Christine Mason stated she eats when bored and feels it is her "biggest issue." She was verbally administered a questionnaire assessing various behaviors related to emotional  eating behaviors. Cesia endorsed the following: overeat when you are celebrating, eat certain foods when you are anxious, stressed, depressed, or your feelings  are hurt, use food to help you cope with emotional situations, find food is comforting to you, overeat when you are angry or upset, overeat when you are worried about something, and overeat frequently when you are bored or lonely. Christine Mason believes the onset of emotional eating behaviors was likely five years ago, and described the current frequency of emotional eating behaviors as "few times a week." In addition, Christine Mason denied a history of binge eating behaviors. Biannca denied a history of significantly restricting food intake, purging and engagement in other compensatory strategies, and has never been diagnosed with an eating disorder. She also denied a history of treatment for emotional eating behaviors. Furthermore, Christine Mason reported ongoing stressors related to finances and the health of her cat, and loss of a friend in August resulting in depressed mood.  Mental Status Examination:  Appearance: neat Behavior: appropriate to circumstances Mood: depressed Affect: mood congruent Speech: WNL Eye Contact: appropriate Psychomotor Activity: WNL Gait: unable to assess  Thought Process: linear, logical, and goal directed and denies suicidal, homicidal, and self-harm ideation, plan and intent  Thought Content/Perception: no hallucinations, delusions, bizarre thinking or behavior endorsed or observed Orientation: AAOx4 Memory/Concentration: memory, attention, language, and fund of knowledge intact  Insight/Judgment: fair  Family & Psychosocial History: Christine Mason reported she is married and she has two cats. 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 Currently, Christine Mason's social support system consists of her parents, sister,  niece, nephew, aunt, and husband. Moreover, Christine Mason stated she resides with her husband and cats.   Medical History:  Past Medical History:  Diagnosis Date   Anxiety    Back pain    Carpal tunnel syndrome    bi lat hands   Chronic leg pain    right   Chronic pain of left knee    Depression    Diverticulitis    Fatty liver    GERD (gastroesophageal reflux disease)    Heart murmur    High cholesterol    History of kidney stones    Hypertension    Hyperthyroidism    Joint pain    Pre-diabetes    Sleep apnea     no C- Pap   Swallowing difficulty    Thyroid cancer ([place])    Past Surgical History:  Procedure Laterality Date   ACNE CYST REMOVAL     CHOLECYSTECTOMY     DILATION AND CURETTAGE OF UTERUS  1998   ESOPHAGOGASTRODUODENOSCOPY (EGD) WITH PROPOFOL N/A 06/13/2019   Procedure: ESOPHAGOGASTRODUODENOSCOPY (EGD) WITH PROPOFOL;  Surgeon: Rogene Houston, MD;  Location: [facility];  Service: Endoscopy;  Laterality: N/A;  7:30   KNEE SURGERY     MALONEY DILATION  06/13/2019   Procedure: MALONEY DILATION;  Surgeon: Rogene Houston, MD;  Location: [facility];  Service: Endoscopy;;   MASS EXCISION     THORACOTOMY Right 2016   THYROIDECTOMY N/A 07/01/2020   Procedure: TOTAL THYROIDECTOMY WITH Central compartment lymph node disection;  Surgeon: Armandina Gemma, MD;  Location: [facility];  Service: General;  Laterality: N/A;   Current Outpatient Medications on File Prior to Visit  Medication Sig Dispense Refill   albuterol (VENTOLIN HFA) 108 (90 Base) MCG/ACT inhaler Inhale into the lungs.     atenolol (TENORMIN) 50 MG tablet Take 1 tablet (50 mg total) by mouth daily. (Patient taking differently: Take 50 mg by mouth at bedtime.)  beclomethasone (QVAR) 40 MCG/ACT inhaler Inhale 2 puffs into the lungs 2 (two) times daily. 11 g 5   Cholecalciferol (VITAMIN D3) 50 MCG (2000 UT) TABS Take 2,000 Units by mouth daily.     desvenlafaxine (PRISTIQ) 100 MG 24 hr tablet Take 1 tablet (100  mg total) by mouth daily.     doxepin (SINEQUAN) 25 MG capsule TAKE (2) CAPSULES BY MOUTH ONCE AT BEDTIME. (Patient not taking: Reported on 07/14/2022) 60 capsule 5   EPINEPHrine 0.3 mg/0.3 mL IJ SOAJ injection Inject 0.3 mg into the muscle as needed for anaphylaxis. 2 each 1   Fluticasone Furoate (ARNUITY ELLIPTA) 100 MCG/ACT AEPB Inhale 1 puff into the lungs daily at 6 (six) AM. 1 each 1   gabapentin (NEURONTIN) 300 MG capsule Take 1 capsule (300 mg total) by mouth at bedtime. 90 capsule 3   HYDROcodone-acetaminophen (NORCO/VICODIN) 5-325 MG tablet Take 1 tablet by mouth every 6 (six) hours as needed for moderate pain.     levocetirizine (XYZAL) 5 MG tablet TAKE (1) TABLET BY MOUTH ONCE EVERY EVENING. 90 tablet 1   levothyroxine (SYNTHROID) 200 MCG tablet Take 1 tablet (200 mcg total) by mouth daily. 90 tablet 3   levothyroxine (SYNTHROID) 50 MCG tablet Take 1 tablet (50 mcg total) by mouth daily. To be taken WITH levothyroxine 200 mcg daily 90 tablet 3   LORazepam (ATIVAN) 1 MG tablet Take 1 mg by mouth at bedtime.      losartan (COZAAR) 100 MG tablet Take 100 mg by mouth daily.      Multiple Vitamin (MULTIVITAMIN) tablet Take 2 tablets by mouth daily. gummy     ondansetron (ZOFRAN ODT) 4 MG disintegrating tablet Take 1 tablet (4 mg total) by mouth every 8 (eight) hours as needed for nausea or vomiting. 10 tablet 0   oxybutynin (DITROPAN-XL) 10 MG 24 hr tablet Take 1 tablet by mouth 2 (two) times daily.     pantoprazole (PROTONIX) 40 MG tablet Take 40 mg by mouth 2 (two) times daily.     rosuvastatin (CRESTOR) 10 MG tablet Take 10 mg by mouth daily.     Semaglutide-Weight Management (WEGOVY) 0.25 MG/0.5ML SOAJ Inject 0.25 mg into the skin once a week. (Patient not taking: Reported on 07/14/2022) 2 mL 0   tiZANidine (ZANAFLEX) 4 MG tablet Take 4 mg by mouth at bedtime.     Vitamin D, Ergocalciferol, (DRISDOL) 1.25 MG (50000 UNIT) CAPS capsule Take 1 capsule (50,000 Units total) by mouth every 7  (seven) days. (Patient not taking: Reported on 07/14/2022) 5 capsule 0   zolpidem (AMBIEN) 10 MG tablet Take 10 mg by mouth at bedtime as needed.     No current facility-administered medications on file prior to visit.  Christine Mason stated she is medication compliant.   Mental Health History: Zinia reported she attended therapeutic services "a long time ago" due to suicidal ideation in high school. She noted Pristiq and Ativan are currently prescribed by her PCP, and Ambien is prescribed by her "sleep doctor," noting she is out of it. She stated Sinequan is prescribed her allergy doctor, and she is out of the medication as well. She was encouraged to contact her prescribing providers, and also speak with her sleep doctor as she is reportedly not using her CPAP. Krystyne reported there is no history of hospitalizations for psychiatric concerns. Gunhild denied a family history of mental health/substance abuse related concerns. Christine Mason reported there is no history of trauma including psychological, physical , and sexual abuse,  as well as neglect.   Christine Mason disclosed a history of suicidal ideation starting in middle school due to decreased self-esteem and limited social interaction. Christine Mason stated she "sometimes" experiences suicidal ideation (e.g., "Just tired of everything" or "Just want to get out of here") when feeling "down" or "depressed." She denied ever experiencing suicidal plan or intent. She indicated the last time she experienced passive suicidal ideation was "couple weeks ago." She described the frequency as "at least once a week" and described it as "fleeting." The following protective factors were identified for Delaine: family, best friend, faith, and desire to lose weight. If she were to become overwhelmed and depressed in the future, which are signs that a crisis may occur, she identified the following coping skills she could engage in: listen to Christine Mason, pray, engage in self-talk, walk  outside, sit outside and enjoy nature, and play games on phone. It was recommended the aforementioned be written down and developed into a coping card for future reference. She was observed writing. Psychoeducation regarding the importance of reaching out to a trusted individual and/or utilizing emergency resources if there is a change in emotional status and/or there is an inability to ensure safety was provided. Caria's confidence in reaching out to a trusted individual and/or utilizing emergency resources should there be an intensification in emotional status and/or there is an inability to ensure safety was assessed on a scale of one to ten where one is not confident and ten is extremely confident. She reported her confidence is a 10. Additionally, Christine Mason denied current access to firearms and/or weapons.   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 Moreover, she described ongoing worry about her health and health of her loved ones; finances; work; and home (e.g., clutter). She noted her anxiety is generalized. Adjoa denied current alcohol use. She denied tobacco use. She denied illicit/recreational substance use. Furthermore, Lashala indicated she is not experiencing the following: hallucinations and delusions, paranoia, symptoms of mania , social withdrawal, panic attacks, memory concerns, and obsessions and compulsions. She also denied current suicidal ideation, plan, and intent; history of and current homicidal ideation, plan, and intent; and history of and current engagement in self-harm.  The following strengths were  reported by Christine Mason: dedicated to job, Scientist, research (physical sciences), dependable, punctual, people person, and caring. The following strengths were observed by this provider: ability to express thoughts and feelings during the therapeutic session, ability to establish and benefit from a therapeutic relationship, willingness to work toward established goal(s) with the clinic and ability to engage in reciprocal conversation.   Legal History: Christine Mason reported there is no history of legal involvement.   Structured Assessments Results: The Patient Health Questionnaire-9 (PHQ-9) is a self-report measure that assesses symptoms and severity of depression over the course of the last two weeks. Kiannah obtained a score of 18 suggesting moderately severe depression. Makenlee finds the endorsed symptoms to be somewhat difficult. [0= Not at all; 1= Several days; 2= More than half the days; 3= Nearly every day] Little interest or pleasure in doing things 2  Feeling down, depressed, or hopeless 3  Trouble falling or staying asleep, or sleeping too much-fluctuates, not using CPAP  3  Feeling tired or having little energy 3  Poor appetite or overeating-fluctuates 3  Feeling bad about yourself --- or that you are a failure or have let yourself or your family down 2  Trouble concentrating on things, such as reading the newspaper or watching television 2  Moving or speaking so slowly that other people could have noticed? Or the opposite --- being so fidgety or restless that you have been moving around a lot more than usual 0  Thoughts that you would be better off dead or hurting yourself in some way 0  PHQ-9 Score 18    The Generalized Anxiety Disorder-7 (GAD-7) is a brief self-report measure that assesses symptoms of anxiety over the course of the last two weeks. Julita obtained a score of 19 suggesting severe anxiety. Gwendlyn finds the endorsed symptoms to be very difficult. [0= Not at all; 1= Several days; 2= Over half the days; 3=  Nearly every day] Feeling nervous, anxious, on edge 2  Not being able to stop or control worrying 3  Worrying too much about different things 3  Trouble relaxing 3  Being so restless that it's hard to sit still 3  Becoming easily annoyed or irritable 2  Feeling afraid as if something awful might happen-"Constantly worry about my health." 3  GAD-7 Score 19   Interventions:  Conducted a chart review Focused on rapport building Verbally administered PHQ-9 and GAD-7 for symptom monitoring Verbally administered Food & Mood questionnaire to assess various behaviors related to emotional eating Provided emphatic reflections and validation Collaborated with patient on a treatment goal  Psychoeducation provided regarding physical versus emotional hunger Conducted a risk assessment Developed a coping card Recommended/discussed option for longer-term therapeutic services  Diagnostic Impressions & Provisional DSM-5 Diagnosis(es): Yvette discussed engaging in emotional eating behaviors and described the frequency as "few times a week." She denied engagement in any other disordered eating behaviors. Based on the aforementioned, the following diagnosis was assigned: F50.89 Other Specified Feeding or Eating Disorder, Emotional Eating Behaviors. Rocky also reported ongoing worry that she described as generalized and endorsed various items on the GAD-7. She also described a history of depressed mood, including suicidal ideation and endorsed various items on the PHQ-9. As such, the following diagnoses were assigned: F41.1 Generalized Anxiety Disorder, and F33.1 Major Depressive Disorder, Recurrent Episode, Moderate. Traditional therapeutic services were recommended.   Plan: Ronette appears able and willing to participate as evidenced by collaboration on a treatment goal, engagement in reciprocal conversation, and asking questions as needed for clarification. Due to Judeth's work schedule and appointment  availability, the next appointment is scheduled for 08/08/2022 at 2:30pm, which will be via [place] Visit. The following treatment goal was established: increase coping skills. This provider will regularly review the treatment plan and medical chart to keep informed of status changes. Nakai expressed understanding and agreement with the initial treatment plan of care. Moksha will be sent a handout via e-mail to utilize between now and the next appointment to increase awareness of hunger patterns and subsequent eating. Gillian provided verbal consent during today's appointment for this provider to send the handout via e-mail. Additionally, she provided verbal consent for this provider to place a referral with [facility].

## 2022-07-12 ENCOUNTER — Other Ambulatory Visit: Payer: Self-pay | Admitting: Internal Medicine

## 2022-07-14 ENCOUNTER — Ambulatory Visit (INDEPENDENT_AMBULATORY_CARE_PROVIDER_SITE_OTHER): Payer: 59 | Admitting: Internal Medicine

## 2022-07-14 ENCOUNTER — Encounter: Payer: Self-pay | Admitting: Internal Medicine

## 2022-07-14 VITALS — BP 124/70 | HR 67 | Ht 62.0 in | Wt 319.0 lb

## 2022-07-14 DIAGNOSIS — R635 Abnormal weight gain: Secondary | ICD-10-CM | POA: Diagnosis not present

## 2022-07-14 DIAGNOSIS — C73 Malignant neoplasm of thyroid gland: Secondary | ICD-10-CM

## 2022-07-14 DIAGNOSIS — E890 Postprocedural hypothyroidism: Secondary | ICD-10-CM | POA: Diagnosis not present

## 2022-07-14 MED ORDER — LEVOTHYROXINE SODIUM 50 MCG PO TABS: 50.0000 ug | ORAL_TABLET | Freq: Every day | ORAL | 3 refills | Status: DC

## 2022-07-14 NOTE — Patient Instructions (Addendum)
  Take levothyroxine 200 mcg PLUS 50 mcg daily    24-Hour Urine Collection  You will be collecting your urine for a 24-hour period of time. Your timer starts with your first urine of the morning (For example - If you first pee at [place], your timer will start at [place]) [place] away your first urine of the morning Collect your urine every time you pee for the next 24 hours STOP your urine collection 24 hours after you started the collection (For example - You would stop at 9AM the day after you started)

## 2022-07-14 NOTE — Progress Notes (Unsigned)
Name: Christine Mason  MRN/ DOB: [id number], 07-06-1971    Age/ Sex: 51 y.o., female     PCP: Celene Squibb, MD   Reason for Endocrinology Evaluation: Postoperative hypothyroidism/PTC     Initial Endocrinology Clinic Visit: 05/03/2020    PATIENT IDENTIFIER: Christine Mason is a 51 y.o., female with a past medical history of HTN, Hx of PTC. She has followed with [facility] since 05/03/2020 for consultative assistance with management of her postoperative hypothyroidism/PTC.   HISTORICAL SUMMARY: The patient was initially diagnosed with hyperthyroidism and MNG in 02/2020    She is S/P LLL nodule 02/2020 with cytology report consistent  with bethesda  category III , Afirma came back suspicious.   She is S/P total thyroidectomy 07/01/2020 with 1 cm PTC with  1 cm left thyroid tumor, unifocal , margins uninvolved. 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) - Focal extranodal extension is present    She is S/P remnant ablation with 98.4 mCi I-131 sodium iodide on 08/20/2020 post treatment WBS was showed tracer activity at the left thyroid bed   Uncle with medullary cancer  SUBJECTIVE:    Today (07/14/2022):  Christine Mason is here for a follow up on hypothyroidism and Hx of PTC  Weight has been stable , follows with weight clinic Denies local neck swelling  No palpitations No change in bowel movements    Levothyroxine 200 mcg daily   HISTORY:  Past Medical History:  Past Medical History:  Diagnosis Date   Anxiety    Back pain    Carpal tunnel syndrome    bi lat hands   Chronic leg pain    right   Chronic pain of left knee    Depression    Diverticulitis    Fatty liver    GERD (gastroesophageal reflux disease)    Heart murmur    High cholesterol    History of kidney stones    Hypertension    Hyperthyroidism    Joint pain    Pre-diabetes    Sleep apnea     no C- Pap   Swallowing difficulty    Thyroid cancer ([place])    Past  Surgical History:  Past Surgical History:  Procedure Laterality Date   ACNE CYST REMOVAL     CHOLECYSTECTOMY     DILATION AND CURETTAGE OF UTERUS  1998   ESOPHAGOGASTRODUODENOSCOPY (EGD) WITH PROPOFOL N/A 06/13/2019   Procedure: ESOPHAGOGASTRODUODENOSCOPY (EGD) WITH PROPOFOL;  Surgeon: Rogene Houston, MD;  Location: [facility];  Service: Endoscopy;  Laterality: N/A;  7:30   KNEE SURGERY     MALONEY DILATION  06/13/2019   Procedure: MALONEY DILATION;  Surgeon: Rogene Houston, MD;  Location: [facility];  Service: Endoscopy;;   MASS EXCISION     THORACOTOMY Right 2016   THYROIDECTOMY N/A 07/01/2020   Procedure: TOTAL THYROIDECTOMY WITH Central compartment lymph node disection;  Surgeon: Armandina Gemma, MD;  Location: [facility];  Service: General;  Laterality: N/A;   Social History:  reports that she quit smoking about 4 years ago. Her smoking use included cigarettes. She has a 2.50 pack-year smoking history. She has never used smokeless tobacco. She reports that she does not drink alcohol and does not use drugs. Family History:  Family History  Problem Relation Age of Onset   Sleep apnea Mother    Cancer Mother    Hyperlipidemia Mother    Diabetes Mother    Hypertension Mother  Breast cancer Mother 21       dx again at 35; PALB2 and CHEK2 positive   Depression Mother    Obesity Mother    Stroke Father    Depression Father    Obesity Father    Hyperlipidemia Father    Diabetes Father    Hypertension Father    Coronary artery disease Father    Sleep apnea Father    Heart disease Father    Hypertension Sister    Breast cancer Sister        diagnosed in her 49's   Lung cancer Maternal Grandmother    Leukemia Maternal Grandfather 24       dx with hairy cell leukemia at 67; CLL at 9   Lymphoma Maternal Grandfather 82       hodgkins lymphoma   Brain cancer Paternal Grandmother    Prostate cancer Paternal Grandfather    Lung cancer Maternal Aunt 57   Thyroid cancer  Maternal Uncle 46       medullary   Lymphoma Maternal Uncle 60       follicular lymphoma   Allergic rhinitis Neg Hx    Asthma Neg Hx      HOME MEDICATIONS: Allergies as of 07/14/2022       Reactions   Penicillins Rash   Has patient had a PCN reaction causing immediate rash, facial/tongue/throat swelling, SOB or lightheadedness with hypotension: {Yes Has patient had a PCN reaction causing severe rash involving mucus membranes or skin necrosis:unknown Has patient had a PCN reaction that required hospitalizationNo Has patient had a PCN reaction occurring within the last 10 years:No If all of the above answers are "NO", then may proceed with Cephalosporin use.        Medication List        Accurate as of July 14, 2022  7:33 AM. If you have any questions, ask your nurse or doctor.          albuterol 108 (90 Base) MCG/ACT inhaler Commonly known as: VENTOLIN HFA Inhale into the lungs.   Arnuity Ellipta 100 MCG/ACT Aepb Generic drug: Fluticasone Furoate Inhale 1 puff into the lungs daily at 6 (six) AM.   atenolol 50 MG tablet Commonly known as: Tenormin Take 1 tablet (50 mg total) by mouth daily. What changed: when to take this   beclomethasone 40 MCG/ACT inhaler Commonly known as: QVAR Inhale 2 puffs into the lungs 2 (two) times daily.   desvenlafaxine 100 MG 24 hr tablet Commonly known as: PRISTIQ Take 1 tablet (100 mg total) by mouth daily.   doxepin 25 MG capsule Commonly known as: SINEQUAN TAKE (2) CAPSULES BY MOUTH ONCE AT BEDTIME.   EPINEPHrine 0.3 mg/0.3 mL Soaj injection Commonly known as: EPI-PEN Inject 0.3 mg into the muscle as needed for anaphylaxis.   gabapentin 300 MG capsule Commonly known as: NEURONTIN Take 1 capsule (300 mg total) by mouth at bedtime.   HYDROcodone-acetaminophen 5-325 MG tablet Commonly known as: NORCO/VICODIN Take 1 tablet by mouth every 6 (six) hours as needed for moderate pain.   levocetirizine 5 MG tablet Commonly  known as: XYZAL TAKE (1) TABLET BY MOUTH ONCE EVERY EVENING.   LORazepam 1 MG tablet Commonly known as: ATIVAN Take 1 mg by mouth at bedtime.   losartan 100 MG tablet Commonly known as: COZAAR Take 100 mg by mouth daily.   meloxicam 15 MG tablet Commonly known as: MOBIC TAKE 1 TABLET BY MOUTH EVERY DAY WITH A MEAL AS NEEDED FOR  PAIN   methocarbamol 750 MG tablet Commonly known as: ROBAXIN TAKE 1 TO 2 TABLETS BY MOUTH UP TO THREE TIMES DAILY AS NEEDED FOR MUSCLE SPASMS.   multivitamin tablet Take 2 tablets by mouth daily. gummy   ondansetron 4 MG disintegrating tablet Commonly known as: Zofran ODT Take 1 tablet (4 mg total) by mouth every 8 (eight) hours as needed for nausea or vomiting.   oxybutynin 10 MG 24 hr tablet Commonly known as: DITROPAN-XL Take 1 tablet by mouth 2 (two) times daily.   oxybutynin 10 MG 24 hr tablet Commonly known as: DITROPAN-XL Take by mouth.   pantoprazole 40 MG tablet Commonly known as: PROTONIX Take 40 mg by mouth 2 (two) times daily.   rosuvastatin 10 MG tablet Commonly known as: CRESTOR Take 10 mg by mouth daily.   Synthroid 200 MCG tablet Generic drug: levothyroxine TAKE 1 TABLET BY MOUTH ONCE A DAY   tiZANidine 4 MG tablet Commonly known as: ZANAFLEX Take 4 mg by mouth at bedtime.   Vitamin D (Ergocalciferol) 1.25 MG (50000 UNIT) Caps capsule Commonly known as: DRISDOL Take 1 capsule (50,000 Units total) by mouth every 7 (seven) days.   Vitamin D3 50 MCG (2000 UT) Tabs Take 2,000 Units by mouth daily.   Wegovy 0.25 MG/0.5ML Soaj Generic drug: Semaglutide-Weight Management Inject 0.25 mg into the skin once a week.   zolpidem 10 MG tablet Commonly known as: AMBIEN Take 10 mg by mouth at bedtime as needed.          OBJECTIVE:   PHYSICAL EXAM: VS: LMP 05/22/2015    EXAM: General: Pt appears well and is in NAD  Eyes: External eye exam normal without stare, lid lag or exophthalmos.  EOM intact.    Neck: General:  Supple without adenopathy. Thyroid: Thyroid size normal.  No goiter or nodules appreciated. No thyroid bruit.  Lungs: Clear with good BS bilat with no rales, rhonchi, or wheezes  Heart: Auscultation: RRR.  Abdomen: Normoactive bowel sounds, soft, nontender, without masses or organomegaly palpable  Extremities:  BL LE: No pretibial edema normal ROM and strength.  Mental Status: Judgment, insight: Intact Orientation: Oriented to time, place, and person Mood and affect: No depression, anxiety, or agitation     DATA REVIEWED:   Latest Reference Range & Units 06/27/22 10:01  Sodium 134 - 144 mmol/L 140  Potassium 3.5 - 5.2 mmol/L 4.5  Chloride 96 - 106 mmol/L 99  CO2 20 - 29 mmol/L 22  Glucose 70 - 99 mg/dL 85  BUN 6 - 24 mg/dL 14  Creatinine 0.57 - 1.00 mg/dL 0.77  Calcium 8.7 - 10.2 mg/dL 9.7  BUN/Creatinine Ratio 9 - 23  18  eGFR >59 mL/min/1.73 93  Alkaline Phosphatase 44 - 121 IU/L 76  Albumin 3.8 - 4.9 g/dL 4.8  Albumin/Globulin Ratio 1.2 - 2.2  1.8  AST 0 - 40 IU/L 28  ALT 0 - 32 IU/L 33 (H)  Total Protein 6.0 - 8.5 g/dL 7.5  Total Bilirubin 0.0 - 1.2 mg/dL 0.4     Latest Reference Range & Units 06/27/22 10:01  TSH 0.450 - 4.500 uIU/mL 3.340  T4,Free(Direct) 0.82 - 1.77 ng/dL 1.56      Thyroid Pathology 07/01/2020   Clinical History: Thyroid neoplasm of uncertain behavior, multiple  thyroid nodules (crm)      FINAL MICROSCOPIC DIAGNOSIS:   A. THYROID, TOTAL THYROIDECTOMY:  - Papillary thyroid carcinoma, classical variant, 1 cm, involving  inferior pole of the right lobe  -  Papillary thyroid carcinoma, classical variant, involving left lobe  - Carcinoma is confined to thyroid gland without extrathyroidal  extension  - Resection margins are negative for carcinoma  - Negative for lymphovascular invasion  - Benign hyperplastic nodule, 2.5 cm, of the right lobe  - See oncology table   B. LYMPH NODES, CENTRAL COMPARTMENT, RESECTION:  - Metastatic papillary  thyroid carcinoma to two of two lymph nodes (2/2)  - Focal extranodal extension is present   Thyroid bed ultrasound 4/28/203   In the left neck lateral to the left carotid and internal jugular vein, there is an abnormal mixed echogenicity solid nodule with calcification measuring 1.3 x 1.2 x 1.5 cm suspicious for a nodal metastasis given the history of papillary thyroid cancer.   No right thyroid bed abnormality.   Trace left thyroid bed elongated oval hypoechoic area measures 8 x 3 x 4 mm, indeterminate for residual left thyroid bed remnant.   IMPRESSION: 1.5 cm left neck mixed echogenicity solid nodule suspicious for a nodal metastasis.   Query trace left thyroid bed remnant.   FNA left nodule 01/18/2022   FINAL MICROSCOPIC DIAGNOSIS:  - Consistent with benign follicular nodule (Bethesda category II)      ASSESSMENT / PLAN / RECOMMENDATIONS:   Hx PTC   -S/p total thyroidectomy with 1 cm papillary thyroid carcinoma on the left, 2/2 lymph node positive for malignancy (06/2020)  - S/P RAI98.4 mCi I-131 sodium iodide on 08/20/2020 post treatment WBS was showed tracer activity at the left thyroid bed  -Thyroid bed ultrasound revealed 1.5 cm left neck nodule (12/2021), s/p benign FNA May/2023 -Patient is at moderate risk for recurrence -TSH goal 0.1-0.5 uIU/mL  -She recently had TSH done through the weight loss clinic, TSH above goal, will add levothyroxine 50 mcg daily to her regimen and recheck in 6 weeks to include TG and TG AB -We will proceed with thyroid bed ultrasound    2. Postoperative hypothyroidism   -Patient with weight gain -- Pt educated extensively on the correct way to take levothyroxine (first thing in the morning with water, 30 minutes before eating or taking other medications). - Pt encouraged to double dose the following day if she were to miss a dose given long half-life of levothyroxine.   Medications  Continue levothyroxine 200 mcg daily PLUS   Start levothyroxine 50 mcg daily    3. Cushingoid features:  -We will proceed with Cushing syndrome screening by obtaining 24-hour urinary cortisol follow-up in 6 months  4. FH of Medullary cancer    - I would avoid ALL  GLP-1 agonists on her due to Uncle with Medullary Cancer     Follow-up in 6 months  Signed electronically by: Mack Guise, MD  [facility] Endocrinology  [facility] [address] [place], [place] [address] Phone: [phone] FAX: [phone]      CC: Celene Squibb, MD [place] [address] Phone: [phone]  Fax: [phone]   Return to Endocrinology clinic as below: Future Appointments  Date Time Provider [place]  07/14/2022 11:30 AM Shamleffer, Melanie Crazier, MD [facility] None  07/18/2022 12:00 PM Nash Dimmer, PsyD [facility] None  07/31/2022  2:00 PM Bowen, Collene Leyden, DO [facility] None  08/24/2022 12:40 PM Bowen, Collene Leyden, DO [facility] None  12/12/2022 10:50 AM [facility] MR 1 [facility]-315MRI [facility]  05/03/2023 11:30 AM Nicholas Lose, MD [facility] None

## 2022-07-17 ENCOUNTER — Encounter (INDEPENDENT_AMBULATORY_CARE_PROVIDER_SITE_OTHER): Payer: Self-pay

## 2022-07-17 MED ORDER — LEVOTHYROXINE SODIUM 200 MCG PO TABS: 200.0000 ug | ORAL_TABLET | Freq: Every day | ORAL | 3 refills | Status: DC

## 2022-07-18 ENCOUNTER — Other Ambulatory Visit (INDEPENDENT_AMBULATORY_CARE_PROVIDER_SITE_OTHER): Payer: Self-pay | Admitting: Family Medicine

## 2022-07-18 ENCOUNTER — Other Ambulatory Visit (HOSPITAL_COMMUNITY): Payer: Self-pay

## 2022-07-18 ENCOUNTER — Telehealth (INDEPENDENT_AMBULATORY_CARE_PROVIDER_SITE_OTHER): Payer: 59 | Admitting: Psychology

## 2022-07-18 DIAGNOSIS — F411 Generalized anxiety disorder: Secondary | ICD-10-CM

## 2022-07-18 DIAGNOSIS — F5089 Other specified eating disorder: Secondary | ICD-10-CM | POA: Diagnosis not present

## 2022-07-18 DIAGNOSIS — F331 Major depressive disorder, recurrent, moderate: Secondary | ICD-10-CM

## 2022-07-19 ENCOUNTER — Telehealth: Payer: Self-pay | Admitting: Internal Medicine

## 2022-07-19 ENCOUNTER — Ambulatory Visit
Admission: RE | Admit: 2022-07-19 | Discharge: 2022-07-19 | Disposition: A | Discharge status: Home / Self Care | Payer: 59 | Source: Ambulatory Visit | Attending: Internal Medicine | Admitting: Internal Medicine

## 2022-07-19 DIAGNOSIS — C73 Malignant neoplasm of thyroid gland: Secondary | ICD-10-CM

## 2022-07-19 NOTE — Telephone Encounter (Signed)
Discussed with the patient on 07/19/2022 at 1630 the results of thyroid bed ultrasound with persistent soft tissue nodule on the left, suspicious for thyroid cancer recurrence.  Current size 1.7 cm at maximum dimension (previously 1.5 cm max)    I have recommended we biopsying the nodule, if results are inconclusive we will proceed with whole-body scan   We will have low threshold for surgical intervention    Abby Nena Jordan, MD  [facility] Endocrinology  [facility] [address] [place], [place] [address] Phone: [phone] FAX: [phone]

## 2022-07-20 ENCOUNTER — Encounter: Payer: Self-pay | Admitting: General Practice

## 2022-07-20 NOTE — Progress Notes (Signed)
Chief Complaint:   OBESITY Christine Mason is here to discuss her progress with her obesity treatment plan along with follow-up of her obesity related diagnoses. Christine Mason is on the Category 3 Plan+100 protein and states she is following her eating plan approximately 40% of the time. Christine Mason states she is not exercising.   Today's visit was #: 2 Starting weight: 315 lbs Starting date: 06/27/2022 Today's weight: 315 lbs Today's date: 07/11/2022 Total lbs lost to date: 0 Total lbs lost since last in-office visit: 0  Interim History: Financial strain, stress, at work a sick pt have been barriers to her progress with meal plan.  She tried out greek yogurt.  Had a tooth pulled last week.  She has been watching her portions.  Eating 2-3 meals per day.  Made a pot of soup with veggies.  Gets off of work at 7 pm.   Subjective:   1. Hx of papillary thyroid carcinoma S/P total thyroidectomy 2021.  She is on thyroid replacement medication. Her uncle had a history of MTC.   2. Vitamin D deficiency She is taking a taking a daily multivitamin and OTC Vitamin D 2,000 IU daily.   3. Other depression with emotional eating Stress levels remain high with poor support at home.  She is on Pristiq 100 mg daily per PCP.   Assessment/Plan:   1. Hx of papillary thyroid carcinoma Reminded patient to take Synthroid 30 minutes apart from other pills and food.  Decided against start of Wegovy.  2. Vitamin D deficiency Discontinue Vitamin D OTC.   Start - Vitamin D, Ergocalciferol, (DRISDOL) 1.25 MG (50000 UNIT) CAPS capsule; Take 1 capsule (50,000 Units total) by mouth every 7 (seven) days. (Patient not taking: Reported on 07/14/2022)  Dispense: 5 capsule; Refill: 0  3. Other depression with emotional eating Add CBT with Dr Mallie Mussel.   4. Obesity, current BMI 57.8 1) after further discussion with endocrinology, given her fam hx of MTC, Wegovy or any GLP-1 receptor agonist is contraindicated. 2) Smaller  plates, bowl and portion control containers recommended.   Christine Mason is currently in the action stage of change. As such, her goal is to continue with weight loss efforts. She has agreed to the Category 3 Plan.   Exercise goals: All adults should avoid inactivity. Some physical activity is better than none, and adults who participate in any amount of physical activity gain some health benefits.  Behavioral modification strategies: increasing lean protein intake, increasing vegetables, increasing water intake, increasing high fiber foods, decreasing eating out, no skipping meals, meal planning and cooking strategies, and decreasing junk food.  Christine Mason has agreed to follow-up with our clinic in 3 weeks. She was informed of the importance of frequent follow-up visits to maximize her success with intensive lifestyle modifications for her multiple health conditions.   Objective:   Blood pressure 135/84, pulse 67, temperature 97.9 F (36.6 C), height '5\' 2"'$  (1.575 m), weight (!) 315 lb (142.9 kg), last menstrual period 05/22/2015, SpO2 97 %. Body mass index is 57.61 kg/m.  General: Cooperative, alert, well developed, in no acute distress. HEENT: Conjunctivae and lids unremarkable. Cardiovascular: Regular rhythm.  Lungs: Normal work of breathing. Neurologic: No focal deficits.   Lab Results  Component Value Date   CREATININE 0.77 06/27/2022   BUN 14 06/27/2022   NA 140 06/27/2022   K 4.5 06/27/2022   CL 99 06/27/2022   CO2 22 06/27/2022   Lab Results  Component Value Date   ALT 33 (H)  06/27/2022   AST 28 06/27/2022   ALKPHOS 76 06/27/2022   BILITOT 0.4 06/27/2022   Lab Results  Component Value Date   HGBA1C 5.6 06/27/2022   Lab Results  Component Value Date   INSULIN 13.5 06/27/2022   Lab Results  Component Value Date   TSH 3.340 06/27/2022   Lab Results  Component Value Date   CHOL 174 06/27/2022   HDL 63 06/27/2022   LDLCALC 89 06/27/2022   TRIG 126 06/27/2022    CHOLHDL 2.8 06/27/2022   Lab Results  Component Value Date   VD25OH 36.2 06/27/2022   Lab Results  Component Value Date   WBC 6.6 06/27/2022   HGB 13.3 06/27/2022   HCT 41.8 06/27/2022   MCV 94 06/27/2022   PLT 332 06/27/2022   No results found for: "IRON", "TIBC", "FERRITIN"  Attestation Statements:   Reviewed by clinician on day of visit: allergies, medications, problem list, medical history, surgical history, family history, social history, and previous encounter notes.  I, Davy Pique, am acting as Location manager for [name] Gambler, DO.  I have reviewed the above documentation for accuracy and completeness, and I agree with the above. Dell Ponto, DO

## 2022-07-20 NOTE — Progress Notes (Unsigned)
Suttle, Rosanne Ashing, MD  Allen Kell, NT Approved for ultrasound guided left neck mass FNA.  Dylan       Previous Messages    ----- Message ----- From: Allen Kell, NT Sent: 07/20/2022  11:05 AM EDT To: Ir Procedure Requests Subject: [place] FNA bx thyroid                              Procedure: [place] FNA BX Thyroid  Reason:Please FNA left thyroid bed nodule 1.7 cm , concerning for cancer recurrence  History: US Thyroid   IMPRESSION: Total thyroidectomy, with redemonstration of soft tissue nodule lateral to the left carotid sheath. This remains suspicious for residual/recurrence, particularly given slight interval enlargement to a maximum dimension of 1.7 cm. Either a repeat biopsy or further evaluation with nuclear medicine testing may be useful   Provider:Shamleffer, Melanie Crazier, [place]

## 2022-07-24 ENCOUNTER — Encounter: Payer: Self-pay | Admitting: Internal Medicine

## 2022-07-26 ENCOUNTER — Ambulatory Visit (INDEPENDENT_AMBULATORY_CARE_PROVIDER_SITE_OTHER): Payer: 59

## 2022-07-26 DIAGNOSIS — J309 Allergic rhinitis, unspecified: Secondary | ICD-10-CM | POA: Diagnosis not present

## 2022-07-27 ENCOUNTER — Encounter (INDEPENDENT_AMBULATORY_CARE_PROVIDER_SITE_OTHER): Payer: Self-pay

## 2022-07-31 ENCOUNTER — Encounter (INDEPENDENT_AMBULATORY_CARE_PROVIDER_SITE_OTHER): Payer: Self-pay

## 2022-07-31 ENCOUNTER — Ambulatory Visit (INDEPENDENT_AMBULATORY_CARE_PROVIDER_SITE_OTHER): Payer: 59 | Admitting: Family Medicine

## 2022-08-03 ENCOUNTER — Other Ambulatory Visit: Payer: Self-pay | Admitting: Orthopedic Surgery

## 2022-08-03 ENCOUNTER — Ambulatory Visit (HOSPITAL_COMMUNITY)
Admission: RE | Admit: 2022-08-03 | Discharge: 2022-08-03 | Disposition: A | Discharge status: Home / Self Care | Payer: 59 | Source: Ambulatory Visit | Attending: Internal Medicine | Admitting: Internal Medicine

## 2022-08-03 ENCOUNTER — Other Ambulatory Visit: Payer: Self-pay | Admitting: Internal Medicine

## 2022-08-03 ENCOUNTER — Encounter (HOSPITAL_COMMUNITY): Payer: Self-pay

## 2022-08-03 ENCOUNTER — Encounter: Payer: Self-pay | Admitting: Internal Medicine

## 2022-08-03 DIAGNOSIS — E041 Nontoxic single thyroid nodule: Secondary | ICD-10-CM | POA: Diagnosis not present

## 2022-08-03 DIAGNOSIS — R221 Localized swelling, mass and lump, neck: Secondary | ICD-10-CM | POA: Insufficient documentation

## 2022-08-03 DIAGNOSIS — Z85850 Personal history of malignant neoplasm of thyroid: Secondary | ICD-10-CM | POA: Insufficient documentation

## 2022-08-03 DIAGNOSIS — C73 Malignant neoplasm of thyroid gland: Secondary | ICD-10-CM

## 2022-08-03 DIAGNOSIS — G5603 Carpal tunnel syndrome, bilateral upper limbs: Secondary | ICD-10-CM

## 2022-08-03 DIAGNOSIS — E890 Postprocedural hypothyroidism: Secondary | ICD-10-CM | POA: Diagnosis not present

## 2022-08-03 NOTE — Progress Notes (Signed)
Patient brought to [place] 1 , prepped and draped in sterile manner. Procedure discussed with pt. Risks, benefits explained. Anesthetic admin without complication.US imaging obtained. Samples obtained without complication. Access site cleaned, dried. Bandaged. No obvious bleeding noted.

## 2022-08-04 ENCOUNTER — Ambulatory Visit (INDEPENDENT_AMBULATORY_CARE_PROVIDER_SITE_OTHER): Payer: 59 | Admitting: *Deleted

## 2022-08-04 DIAGNOSIS — J309 Allergic rhinitis, unspecified: Secondary | ICD-10-CM

## 2022-08-04 LAB — CYTOLOGY - NON PAP

## 2022-08-07 ENCOUNTER — Encounter: Payer: Self-pay | Admitting: Internal Medicine

## 2022-08-07 DIAGNOSIS — C73 Malignant neoplasm of thyroid gland: Secondary | ICD-10-CM

## 2022-08-08 ENCOUNTER — Telehealth (INDEPENDENT_AMBULATORY_CARE_PROVIDER_SITE_OTHER): Payer: 59 | Admitting: Psychology

## 2022-08-08 DIAGNOSIS — F411 Generalized anxiety disorder: Secondary | ICD-10-CM

## 2022-08-08 DIAGNOSIS — F5089 Other specified eating disorder: Secondary | ICD-10-CM

## 2022-08-08 DIAGNOSIS — F331 Major depressive disorder, recurrent, moderate: Secondary | ICD-10-CM

## 2022-08-08 NOTE — Progress Notes (Signed)
  Office: [phone]  /  Fax: [phone]    Date: August 08, 2022    Appointment Start Time: 2:29pm Duration: 35 minutes Provider: Glennie Isle, Psy.D. Type of Session: Individual Therapy  Location of Patient: Home (private location) Location of Provider: Provider's Home (private office) Type of Contact: Telepsychological Visit via MyChart Video Visit  Session Content: Christine Mason is a 51 y.o. female presenting for a follow-up appointment to address the previously established treatment goal of increasing coping skills.Today's appointment was a telepsychological visit. Christine Mason provided verbal consent for today's telepsychological appointment and she is aware she is responsible for securing confidentiality on her end of the session. Prior to proceeding with today's appointment, Christine Mason's physical location at the time of this appointment was obtained as well a phone number she could be reached at in the event of technical difficulties. Christine Mason and this provider participated in today's telepsychological service.   This provider conducted a brief check-in. Christine Mason reported she is getting ready for her upcoming cruise. Additionally, she disclosed her cat passed away 2022/12/17 and she will have to undergo a nuclear scan, adding the aforementioned impacted her appetite. A risk assessment was completed. Christine Mason denied experiencing suicidal, self-harm, and homicidal ideation, plan, and intent since the last appointment with this provider. She described future plans that include ongoing efforts to improve her well-being and eating habits. Reviewed emotional and physical hunger. Due to the upcoming holiday(s) and cruise, psychoeducation regarding making better choices and engaging in portion control during the holidays/celebrations was provided. More specifically, this provider discussed the following strategies: coming to meals hungry, but not starving; avoid filling up on appetizers; managing portion sizes; not  completely depriving yourself; making the plate colorful (e.g., vegetables); pacing yourself (e.g., waiting 10 minutes before going back for seconds); taking advantage of the nutritious foods; practicing mindfulness; staying hydrated; and avoid bringing home leftovers. Overall, Christine Mason was receptive to today's appointment as evidenced by openness to sharing, responsiveness to feedback, and willingness to implement discussed strategies .  Mental Status Examination:  Appearance: neat Behavior: appropriate to circumstances Mood: sad Affect: mood congruent Speech: WNL Eye Contact: appropriate Psychomotor Activity: WNL Gait: unable to assess Thought Process: linear, logical, and goal directed and denies suicidal, homicidal, and self-harm ideation, plan and intent  Thought Content/Perception: no hallucinations, delusions, bizarre thinking or behavior endorsed or observed Orientation: AAOx4 Memory/Concentration: memory, attention, language, and fund of knowledge intact  Insight: fair Judgment: fair  Interventions:  Conducted a brief chart review Conducted a risk assessment Provided empathic reflections and validation Reviewed content from the previous session Employed supportive psychotherapy interventions to facilitate reduced distress and to improve coping skills with identified stressors Psychoeducation provided regarding strategies for celebrations/holidays/vacations  DSM-5 Diagnosis(es):  F50.89 Other Specified Feeding or Eating Disorder, Emotional Eating Behaviors, F41.1 Generalized Anxiety Disorder, and F33.1  Major Depressive Disorder, Recurrent Episode, Moderate  Treatment Goal & Progress: During the initial appointment with this provider, the following treatment goal was established: increase coping skills. Progress is limited, as [name] has just begun treatment with this provider; however, she is receptive to the interaction and interventions and rapport is being established.    Plan: The next appointment is scheduled for 08/21/2022 at 4:30pm, which will be via MyChart Video Visit. The next session will focus on working towards the established treatment goal. [name] is scheduled for an initial appointment with [place] on August 24, 2022.

## 2022-08-18 ENCOUNTER — Encounter: Payer: Self-pay | Admitting: Internal Medicine

## 2022-08-21 ENCOUNTER — Telehealth (INDEPENDENT_AMBULATORY_CARE_PROVIDER_SITE_OTHER): Payer: 59 | Admitting: Psychology

## 2022-08-21 ENCOUNTER — Encounter: Payer: Self-pay | Admitting: Internal Medicine

## 2022-08-21 DIAGNOSIS — F5089 Other specified eating disorder: Secondary | ICD-10-CM

## 2022-08-21 DIAGNOSIS — F411 Generalized anxiety disorder: Secondary | ICD-10-CM | POA: Diagnosis not present

## 2022-08-21 DIAGNOSIS — F331 Major depressive disorder, recurrent, moderate: Secondary | ICD-10-CM

## 2022-08-21 NOTE — Progress Notes (Signed)
  Office: [phone]  /  Fax: [phone]    Date: August 21, 2022    Appointment Start Time: 2:30pm Duration: 36 minutes Provider: Glennie Isle, Psy.D. Type of Session: Individual Therapy  Location of Patient: Home (private location) Location of Provider: Provider's Home (private office) Type of Contact: Telepsychological Visit via MyChart Video Visit  Session Content: Christine Mason is a 51 y.o. female presenting for a follow-up appointment to address the previously established treatment goal of increasing coping skills.Today's appointment was a telepsychological visit. Christine Mason provided verbal consent for today's telepsychological appointment and she is aware she is responsible for securing confidentiality on her end of the session. Prior to proceeding with today's appointment, Christine Mason's physical location at the time of this appointment was obtained as well a phone number she could be reached at in the event of technical difficulties. Christine Mason and this provider participated in today's telepsychological service.   This provider conducted a brief check-in. Christine Mason stated she had a "wonderful time" on the cruise and described it as improving her mood. However, she described some frustration today due to financial and health concerns. A risk assessment was completed. Christine Mason denied experiencing suicidal, self-harm, and homicidal ideation, plan, and intent since the last appointment with this provider. She described future plans that include ongoing efforts to improve her well-being and eating habits. Regarding eating habits, Christine Mason discussed doing "the best [she] could." Further explored and processed. This provider and Christine Mason reflected on what went well while on vacation and on Thanksgiving and what she could do differently in the future. Notably, Christine Mason discussed challenges following her structured meal plan. Further explored and processed. Engaged her in problem solving. Overall, Christine Mason was  receptive to today's appointment as evidenced by openness to sharing, responsiveness to feedback, and willingness to implement discussed strategies.   Mental Status Examination:  Appearance: neat Behavior: appropriate to circumstances Mood: sad Affect: mood congruent Speech: WNL Eye Contact: appropriate Psychomotor Activity: WNL Gait: unable to assess Thought Process: linear, logical, and goal directed and denies suicidal, homicidal, and self-harm ideation, plan and intent  Thought Content/Perception: no hallucinations, delusions, bizarre thinking or behavior endorsed or observed Orientation: AAOx4 Memory/Concentration: memory, attention, language, and fund of knowledge intact  Insight: fair Judgment: fair  Interventions:  Conducted a brief chart review Conducted a risk assessment Provided empathic reflections and validation Reviewed content from the previous session Provided positive reinforcement Employed supportive psychotherapy interventions to facilitate reduced distress and to  Engaged pt in problem solving   DSM-5 Diagnosis(es):  F50.89 Other Specified Feeding or Eating Disorder, Emotional Eating Behaviors, F41.1 Generalized Anxiety Disorder, and F33.1  Major Depressive Disorder, Recurrent Episode, Moderate  Treatment Goal & Progress: During the initial appointment with this provider, the following treatment goal was established: increase coping skills. Christine Mason has demonstrated progress in her goal as evidenced by increased awareness of hunger patterns.   Plan: The next appointment is scheduled for 08/29/2022 at 8am, which will be via [place] Visit. The next session will focus on working towards the established treatment goal. Christine Mason will be initiating therapeutic services with [facility] on 08/24/2022.

## 2022-08-23 ENCOUNTER — Encounter: Payer: Self-pay | Admitting: Internal Medicine

## 2022-08-24 ENCOUNTER — Telehealth (INDEPENDENT_AMBULATORY_CARE_PROVIDER_SITE_OTHER): Payer: 59 | Admitting: Family Medicine

## 2022-08-24 ENCOUNTER — Encounter (INDEPENDENT_AMBULATORY_CARE_PROVIDER_SITE_OTHER): Payer: Self-pay | Admitting: Family Medicine

## 2022-08-24 ENCOUNTER — Ambulatory Visit: Payer: 59 | Admitting: Psychology

## 2022-08-24 DIAGNOSIS — K760 Fatty (change of) liver, not elsewhere classified: Secondary | ICD-10-CM | POA: Diagnosis not present

## 2022-08-24 DIAGNOSIS — E559 Vitamin D deficiency, unspecified: Secondary | ICD-10-CM

## 2022-08-24 DIAGNOSIS — I871 Compression of vein: Secondary | ICD-10-CM | POA: Insufficient documentation

## 2022-08-24 DIAGNOSIS — E042 Nontoxic multinodular goiter: Secondary | ICD-10-CM

## 2022-08-24 DIAGNOSIS — E669 Obesity, unspecified: Secondary | ICD-10-CM

## 2022-08-24 DIAGNOSIS — F3289 Other specified depressive episodes: Secondary | ICD-10-CM | POA: Diagnosis not present

## 2022-08-24 DIAGNOSIS — Z6841 Body Mass Index (BMI) 40.0 and over, adult: Secondary | ICD-10-CM

## 2022-08-24 MED ORDER — VITAMIN D (ERGOCALCIFEROL) 1.25 MG (50000 UNIT) PO CAPS: 50000.0000 [IU] | ORAL_CAPSULE | ORAL | 0 refills | Status: DC

## 2022-08-25 ENCOUNTER — Ambulatory Visit (INDEPENDENT_AMBULATORY_CARE_PROVIDER_SITE_OTHER): Payer: 59

## 2022-08-25 DIAGNOSIS — J309 Allergic rhinitis, unspecified: Secondary | ICD-10-CM | POA: Diagnosis not present

## 2022-08-28 ENCOUNTER — Ambulatory Visit (INDEPENDENT_AMBULATORY_CARE_PROVIDER_SITE_OTHER): Payer: 59 | Admitting: Internal Medicine

## 2022-08-28 ENCOUNTER — Encounter: Payer: Self-pay | Admitting: Internal Medicine

## 2022-08-28 ENCOUNTER — Telehealth: Payer: Self-pay | Admitting: Internal Medicine

## 2022-08-28 VITALS — BP 130/72 | HR 95 | Ht 62.0 in | Wt 318.0 lb

## 2022-08-28 DIAGNOSIS — E890 Postprocedural hypothyroidism: Secondary | ICD-10-CM

## 2022-08-28 DIAGNOSIS — C73 Malignant neoplasm of thyroid gland: Secondary | ICD-10-CM

## 2022-08-28 NOTE — Progress Notes (Signed)
Name: Christine Mason  MRN/ DOB: [id number], 1970/12/09    Age/ Sex: 51 y.o., female     PCP: Celene Squibb, MD   Reason for Endocrinology Evaluation: Postoperative hypothyroidism/PTC     Initial Endocrinology Clinic Visit: 05/03/2020    PATIENT IDENTIFIER: Christine Mason is a 51 y.o., female with a past medical history of HTN, Hx of PTC. She has followed with [facility] since 05/03/2020 for consultative assistance with management of her postoperative hypothyroidism/PTC.   HISTORICAL SUMMARY: The patient was initially diagnosed with hyperthyroidism and MNG in 02/2020    She is S/P LLL nodule 02/2020 with cytology report consistent  with bethesda  category III , Afirma came back suspicious.   She is S/P total thyroidectomy 07/01/2020 with 1 cm PTC with  1 cm left thyroid tumor, unifocal , margins uninvolved. 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  Status post benign FNA on 01/18/2022.   Thyroid bed ultrasound 08/03/2022 showed interval enlargement of the left thyroid nodule at 1.7 cm.  She is as/P FNA 08/03/2022, despite no malignant cells the evaluation was considered limited due to abundance of histiocytes and inflammatory cells.    Uncle with medullary cancer  SUBJECTIVE:    Today (08/28/2022):  [name] is here for a follow up on hypothyroidism and Hx of PTC. She is accompanied by her mother, sister and aunt today    She is S?p FNA of the left thyroid nodule last month with a cytology report described as limited.  We have opted to proceed with WBS prior to determining the need for surgical intervention but it appears that she is scheduled for RAI  treatment rather than just the WBS   She is compliant     Levothyroxine 200 mcg daily  Levothyroxine 50 mcg daily      HISTORY:  Past Medical History:  Past Medical History:  Diagnosis Date   Anxiety    Back pain    Carpal tunnel syndrome    bi lat hands   Chronic leg pain    right   Chronic pain of left knee    Depression    Diverticulitis    Fatty liver    GERD (gastroesophageal reflux disease)    Heart murmur    High cholesterol    History of kidney stones    Hypertension    Hyperthyroidism    Joint pain    Pre-diabetes    Sleep apnea     no C- Pap   Swallowing difficulty    Thyroid cancer ([place])    Past Surgical History:  Past Surgical History:  Procedure Laterality Date   ACNE CYST REMOVAL     CHOLECYSTECTOMY     DILATION AND CURETTAGE OF UTERUS  1998   ESOPHAGOGASTRODUODENOSCOPY (EGD) WITH PROPOFOL N/A 06/13/2019   Procedure: ESOPHAGOGASTRODUODENOSCOPY (EGD) WITH PROPOFOL;  Surgeon: Rogene Houston, MD;  [facility];  Service: Endoscopy;  Laterality: N/A;  7:30   KNEE SURGERY     MALONEY DILATION  06/13/2019   Procedure: MALONEY DILATION;  Surgeon: Rogene Houston, MD;  Location: [facility];  Service: Endoscopy;;   MASS  EXCISION     THORACOTOMY Right 2016   THYROIDECTOMY N/A 07/01/2020   Procedure: TOTAL THYROIDECTOMY WITH Central compartment lymph node disection;  Surgeon: Armandina Gemma, MD;  Location: [facility];  Service: General;  Laterality: N/A;   Social History:  reports that she quit smoking about 4 years ago. Her smoking use included cigarettes. She has a 2.50 pack-year smoking history. She has never used smokeless tobacco. She reports that she does not drink alcohol and does not use drugs. Family History:  Family History  Problem Relation Age of Onset   Sleep apnea Mother    Cancer Mother    Hyperlipidemia Mother    Diabetes Mother    Hypertension Mother    Breast cancer Mother 8       dx again at 23; PALB2 and CHEK2 positive    Depression Mother    Obesity Mother    Stroke Father    Depression Father    Obesity Father    Hyperlipidemia Father    Diabetes Father    Hypertension Father    Coronary artery disease Father    Sleep apnea Father    Heart disease Father    Hypertension Sister    Breast cancer Sister        diagnosed in her 66's   Lung cancer Maternal Grandmother    Leukemia Maternal Grandfather 15       dx with hairy cell leukemia at 69; CLL at 48   Lymphoma Maternal Grandfather 82       hodgkins lymphoma   Brain cancer Paternal Grandmother    Prostate cancer Paternal Grandfather    Lung cancer Maternal Aunt 57   Thyroid cancer Maternal Uncle 46       medullary   Lymphoma Maternal Uncle 60       follicular lymphoma   Allergic rhinitis Neg Hx    Asthma Neg Hx      HOME MEDICATIONS: Allergies as of 08/28/2022       Reactions   Penicillins Rash   Has patient had a PCN reaction causing immediate rash, facial/tongue/throat swelling, SOB or lightheadedness with hypotension: {Yes Has patient had a PCN reaction causing severe rash involving mucus membranes or skin necrosis:unknown Has patient had a PCN reaction that required hospitalizationNo Has patient had a PCN reaction occurring within the last 10 years:No If all of the above answers are "NO", then may proceed with Cephalosporin use.        Medication List        Accurate as of August 28, 2022 11:29 AM. If you have any questions, ask your nurse or doctor.          albuterol 108 (90 Base) MCG/ACT inhaler Commonly known as: VENTOLIN HFA Inhale into the lungs.   Arnuity Ellipta 100 MCG/ACT Aepb Generic drug: Fluticasone Furoate Inhale 1 puff into the lungs daily at 6 (six) AM.   atenolol 50 MG tablet Commonly known as: Tenormin Take 1 tablet (50 mg total) by mouth daily. What changed: when to take this   beclomethasone 40 MCG/ACT inhaler Commonly known as: QVAR Inhale 2 puffs into the lungs 2 (two) times daily.    desvenlafaxine 100 MG 24 hr tablet Commonly known as: PRISTIQ Take 1 tablet (100 mg total) by mouth daily.   doxepin 25 MG capsule Commonly known as: SINEQUAN TAKE (2) CAPSULES BY MOUTH ONCE AT BEDTIME.   EPINEPHrine 0.3 mg/0.3 mL Soaj injection Commonly known as: EPI-PEN Inject 0.3 mg into the muscle  as needed for anaphylaxis.   HYDROcodone-acetaminophen 5-325 MG tablet Commonly known as: NORCO/VICODIN Take 1 tablet by mouth every 6 (six) hours as needed for moderate pain.   levocetirizine 5 MG tablet Commonly known as: XYZAL TAKE (1) TABLET BY MOUTH ONCE EVERY EVENING.   levothyroxine 50 MCG tablet Commonly known as: SYNTHROID Take 1 tablet (50 mcg total) by mouth daily. To be taken WITH levothyroxine 200 mcg daily   levothyroxine 200 MCG tablet Commonly known as: Synthroid Take 1 tablet (200 mcg total) by mouth daily.   LORazepam 1 MG tablet Commonly known as: ATIVAN Take 1 mg by mouth at bedtime.   losartan 100 MG tablet Commonly known as: COZAAR Take 100 mg by mouth daily.   multivitamin tablet Take 2 tablets by mouth daily. gummy   Neurontin 300 MG capsule Generic drug: gabapentin TAKE 1 CAPSULE BY MOUTH ONCE A DAY AT BEDTIME   ondansetron 4 MG disintegrating tablet Commonly known as: Zofran ODT Take 1 tablet (4 mg total) by mouth every 8 (eight) hours as needed for nausea or vomiting.   oxybutynin 10 MG 24 hr tablet Commonly known as: DITROPAN-XL Take 1 tablet by mouth 2 (two) times daily.   pantoprazole 40 MG tablet Commonly known as: PROTONIX Take 40 mg by mouth 2 (two) times daily.   rosuvastatin 10 MG tablet Commonly known as: CRESTOR Take 10 mg by mouth daily.   tiZANidine 4 MG tablet Commonly known as: ZANAFLEX Take 4 mg by mouth at bedtime.   Vitamin D (Ergocalciferol) 1.25 MG (50000 UNIT) Caps capsule Commonly known as: DRISDOL Take 1 capsule (50,000 Units total) by mouth every 7 (seven) days.   Vitamin D3 50 MCG (2000 UT) Tabs Take  2,000 Units by mouth daily.   zolpidem 10 MG tablet Commonly known as: AMBIEN Take 10 mg by mouth at bedtime as needed.          OBJECTIVE:   PHYSICAL EXAM: VS: BP 130/72 (BP Location: Left Arm, Patient Position: Sitting, Cuff Size: Large)   Pulse 95   Ht _0  (1.575 m)   Wt (!) 318 lb (144.2 kg)   LMP 05/22/2015   SpO2 95%   BMI 58.16 kg/m    EXAM: General: Pt appears well and is in NAD  Eyes: External eye exam normal without stare, lid lag or exophthalmos.  EOM intact.    Neck: General: Supple without adenopathy. Thyroid:   No goiter or nodules appreciated.  Lungs: Clear with good BS bilat with no rales, rhonchi, or wheezes  Heart: Auscultation: RRR.  Mental Status: Judgment, insight: Intact Orientation: Oriented to time, place, and person Mood and affect: No depression, anxiety, or agitation     DATA REVIEWED:    08/24/2022 TSH 0.625     Latest Reference Range & Units 06/27/22 10:01  Sodium 134 - 144 mmol/L 140  Potassium 3.5 - 5.2 mmol/L 4.5  Chloride 96 - 106 mmol/L 99  CO2 20 - 29 mmol/L 22  Glucose 70 - 99 mg/dL 85  BUN 6 - 24 mg/dL 14  Creatinine 0.57 - 1.00 mg/dL 0.77  Calcium 8.7 - 10.2 mg/dL 9.7  BUN/Creatinine Ratio 9 - 23  18  eGFR >59 mL/min/1.73 93  Alkaline Phosphatase 44 - 121 IU/L 76  Albumin 3.8 - 4.9 g/dL 4.8  Albumin/Globulin Ratio 1.2 - 2.2  1.8  AST 0 - 40 IU/L 28  ALT 0 - 32 IU/L 33 (H)  Total Protein 6.0 - 8.5 g/dL 7.5  Total  Bilirubin 0.0 - 1.2 mg/dL 0.4     Latest Reference Range & Units 06/27/22 10:01  TSH 0.450 - 4.500 uIU/mL 3.340  T4,Free(Direct) 0.82 - 1.77 ng/dL 1.56      Thyroid Pathology 07/01/2020   Clinical History: Thyroid neoplasm of uncertain behavior, multiple  thyroid nodules ([facility])      FINAL MICROSCOPIC DIAGNOSIS:   A. THYROID, TOTAL THYROIDECTOMY:  - Papillary thyroid carcinoma, classical variant, 1 cm, involving  inferior pole of the right lobe  - Papillary thyroid carcinoma, classical  variant, involving left lobe  - Carcinoma is confined to thyroid gland without extrathyroidal  extension  - Resection margins are negative for carcinoma  - Negative for lymphovascular invasion  - Benign hyperplastic nodule, 2.5 cm, of the right lobe  - See oncology table   B. 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 Current dimensions 1.4 cm x 1.6 cm x 1.7 cm, previously 1.3 cm x 1.2 cm 1.5 cm.   Additional small focal hypodensities medial to the carotid sheath in the surgical thyroid bed, 6 mm and unchanged from the prior.   IMPRESSION: Total thyroidectomy, with redemonstration of soft tissue nodule lateral to the left carotid sheath. This remains suspicious for residual/recurrence, particularly given slight interval enlargement to a maximum dimension of 1.7 cm. Either a repeat biopsy or further evaluation with nuclear medicine testing may be useful.     FNA left nodule 01/18/2022   FINAL MICROSCOPIC DIAGNOSIS:  - Consistent with benign follicular nodule (Bethesda category II)   FNA left nodule 08/03/2022  Clinical History: 1.7 cm LLL, left thyroid bed nodule; Hx thyroidectomy 2021, papillary thyroid cancer Specimen Submitted:  A. THYROID, LLL, FINE NEEDLE ASPIRATION:   FINAL MICROSCOPIC DIAGNOSIS: - No malignant cells identified - See comment  Comment: While no malignant cells are identified, evaluation is limited by a paucicellular specimen consisting predominantly of histiocytes and chronic inflammatory cells.    ASSESSMENT / PLAN / RECOMMENDATIONS:   Hx PTC   -S/p total thyroidectomy with 1 cm papillary thyroid carcinoma on the left, 2/2 lymph node  positive for malignancy (06/2020)  - S/P RAI 98.4 mCi I-131 sodium iodide on 08/20/2020 post treatment WBS was showed tracer activity at the left thyroid bed  -Thyroid bed ultrasound revealed 1.5 cm left neck nodule (12/2021), s/p benign FNA May/2023.  Due to interval enlargement we attempted another FNA 08/03/2022 but despite no evidence of malignant cells the sample was considered of limited evaluation -Patient is at moderate risk for recurrence -TSH goal 0.1-0.5 uIU/mL  - TSH has trended down from 3.340 uIU/mL to 0.625 uIU/mL  - No change at this time  - Tg and Tg Ab pending    2. Postoperative hypothyroidism   -- Pt educated extensively on the correct way to take levothyroxine (first thing in the morning with water, 30 minutes before eating or taking other medications). - Pt encouraged to double dose the following day if she were to miss a dose given long half-life of levothyroxine.   Medications  Continue levothyroxine 200 mcg daily PLUS  Continue  levothyroxine 50 mcg daily    3. Cushingoid features:  -We had discussed screening for  Cushing syndrome on last visit  - Pending 24-hour  urinary cortisol  4. FH of Medullary cancer    - I would avoid ALL  GLP-1 agonists on her due to Uncle with Medullary Cancer     Follow-up in 4 months   I spent 25 minutes preparing to see the patient by review of recent labs, imaging and procedures, obtaining and reviewing separately obtained history, communicating with the patient/family or caregiver, ordering medications, tests or procedures, and documenting clinical information in the EHR including the differential Dx, treatment, and any further evaluation and other management    Signed electronically by: Mack Guise, MD  [facility] Endocrinology  [facility] [address] [place], [place] [address] Phone: [phone] FAX: [phone]      CC: Celene Squibb, MD [place]  [place] [address] Phone: [phone]  Fax: [phone]   Return to Endocrinology clinic as below: Future Appointments  Date Time Provider [place]  08/29/2022  8:00 AM Nash Dimmer, PsyD [facility] None  09/27/2022  8:30 AM AP-[facility] INJ 1 AP-NM [place] [facility]  09/28/2022  8:30 AM AP-NM INJ 1 AP-NM [name] [facility]  09/29/2022  8:30 AM AP-NM INJ 1 AP-NM [place] [facility]  10/02/2022  8:30 AM AP-NM 2 AP-NM [place] [facility]  12/12/2022 10:50 AM GI-315 MR 1 [id number] GI-315 [facility]  01/15/2023  2:00 PM Adamaris King, Melanie Crazier, MD [facility] None  05/03/2023 11:30 AM Nicholas Lose, MD [facility] None

## 2022-08-28 NOTE — Telephone Encounter (Signed)
Per Lolita Patella at Nuclear Medicine patient is only schedule for the scan, she will have to take a radioactive iodine tablet before the test is performed. Mychart has been sent patient

## 2022-08-28 NOTE — Telephone Encounter (Signed)
Morey Hummingbird,    Can you please call the Nuclear Medicine department at [facility] and make sure they correct the order they have on the pt ?   I have ordered Whole body scan with thyrogen only    It appears that they have scheduled her for Cancer thyroid radiation treatment followed by Whole body scan which is INCorrect    I just need the scan not the treatment    Thanks

## 2022-08-28 NOTE — Telephone Encounter (Signed)
Called 3 times and got a vm . Left message for them to give me a call back about patient

## 2022-08-28 NOTE — Patient Instructions (Signed)

## 2022-08-29 ENCOUNTER — Telehealth (INDEPENDENT_AMBULATORY_CARE_PROVIDER_SITE_OTHER): Payer: 59 | Admitting: Psychology

## 2022-08-29 DIAGNOSIS — F411 Generalized anxiety disorder: Secondary | ICD-10-CM | POA: Diagnosis not present

## 2022-08-29 DIAGNOSIS — F331 Major depressive disorder, recurrent, moderate: Secondary | ICD-10-CM | POA: Diagnosis not present

## 2022-08-29 DIAGNOSIS — F5089 Other specified eating disorder: Secondary | ICD-10-CM

## 2022-08-29 NOTE — Progress Notes (Signed)
  Office: [phone]  /  Fax: [phone]    Date: August 29, 2022    Appointment Start Time: 11:01am Duration: 31 minutes Provider: Glennie Isle, Psy.D. Type of Session: Individual Therapy  Location of Patient: Home (private location) Location of Provider: Provider's Home (private office) Type of Contact: Telepsychological Visit via MyChart Video Visit  Session Content: Christine Mason is a 51 y.o. female presenting for a follow-up appointment to address the previously established treatment goal of increasing coping skills.Today's appointment was a telepsychological visit. Shontelle provided verbal consent for today's telepsychological appointment and she is aware she is responsible for securing confidentiality on her end of the session. Prior to proceeding with today's appointment, Jaylin's physical location at the time of this appointment was obtained as well a phone number she could be reached at in the event of technical difficulties. Amberlie and this provider participated in today's telepsychological service.   This provider conducted a brief check-in. Kaianna shared, "I'm doing okay." She shared about a recent medical appointment, noting she received clarification regarding recent concerns about a "nodule."  A risk assessment was completed. Jurnei denied experiencing suicidal, self-harm, and homicidal ideation, plan, and intent since the last appointment with this provider. She described future plans that include ongoing efforts to improve her well-being and eating habits. Regarding eating habits, Frona noted, "Some days are better than others." She reported she continues to engage in emotional eating behaviors due to frustration and stress. She also expressed concern regarding stress during the holidays. As such, Tattianna was engaged in problem solving to develop a plan to help cope with urges/cravings involving activities to relax, activities to distract, comforting places, people to call and  connect with, and activities that help soothe senses. She was observed writing the plan. Overall, Navika was receptive to today's appointment as evidenced by openness to sharing, responsiveness to feedback, and willingness to implement discussed strategies .  Mental Status Examination:  Appearance: neat Behavior: appropriate to circumstances Mood: sad Affect: mood congruent Speech: WNL Eye Contact: appropriate Psychomotor Activity: WNL Gait: unable to assess Thought Process: linear, logical, and goal directed and denies suicidal, homicidal, and self-harm ideation, plan and intent  Thought Content/Perception: no hallucinations, delusions, bizarre thinking or behavior endorsed or observed Orientation: AAOx4 Memory/Concentration: memory, attention, language, and fund of knowledge intact  Insight: fair Judgment: fair  Interventions:  Conducted a brief chart review Conducted a risk assessment Provided empathic reflections and validation Employed supportive psychotherapy interventions to facilitate reduced distress and to improve coping skills with identified stressors Engaged patient in problem solving Recommended/discussed options for longer-term therapeutic services  DSM-5 Diagnosis(es):  F50.89 Other Specified Feeding or Eating Disorder, Emotional Eating Behaviors, F41.1 Generalized Anxiety Disorder, and F33.1  Major Depressive Disorder, Recurrent Episode, Moderate  Treatment Goal & Progress: During the initial appointment with this provider, the following treatment goal was established: increase coping skills. Kahlia has demonstrated progress in her goal as evidenced by increased awareness of hunger patterns. Tanaja also continues to demonstrate willingness to engage in learned skill(s).  Plan: The next appointment is scheduled for 09/12/2022 at 2:30pm, which will be via MyChart Video Visit. The next session will focus on working towards the established treatment goal. Estela stated  she canceled her initial appointment with [place], noting a plan to reschedule before the next appointment with this provider.

## 2022-08-30 ENCOUNTER — Ambulatory Visit (INDEPENDENT_AMBULATORY_CARE_PROVIDER_SITE_OTHER): Payer: 59

## 2022-08-30 DIAGNOSIS — J309 Allergic rhinitis, unspecified: Secondary | ICD-10-CM

## 2022-08-31 NOTE — Progress Notes (Signed)
TeleHealth Visit:  Due to the COVID-19 pandemic, this visit was completed with telemedicine (audio/video) technology to reduce patient and provider exposure as well as to preserve personal protective equipment.   Christine Mason has verbally consented to this TeleHealth visit. The patient is located at home, the provider is located at the [facility] office. The participants in this visit include the listed provider and patient. The visit was conducted today via video.  Chief Complaint: OBESITY Christine Mason is here to discuss her progress with her obesity treatment plan along with follow-up of her obesity related diagnoses. Christine Mason is on the Category 3 Plan and states she is following her eating plan approximately 50% of the time. Christine Mason states she is not exercising.  Today's visit was #: 3 Starting weight: 315 LBS Starting date: 06/27/2022  Interim History: Patient admits to being off track.  Does not feel motivation to make changes.  Went on a cruise week of 11/27.  Had a nice time.  Stress levels remain high.  She is worried about her thyroid.  She had an inconclusive biopsy, awaiting scan.  She has a history of papillary thyroid carcinoma.  Subjective:   1. Multiple thyroid nodules Had thyroid biopsy, not a good sample.  Awaiting nuclear scan.  Patient has follow-up with Dr. Phoebe Perch.  2. Vitamin D deficiency On 06/27/2022, vitamin D level was 36.2.  She is on prescription vitamin D 50,000 IU weekly.  Her energy level is stable.  3. Hepatic steatosis ALT 33, on 06/27/2022.  Probable hepatic steatic steatosis on CT of abdomen.  Actively working on weight loss.  4. Other depression with emotional eating Doing CBT with Dr. Mallie Mussel.  She is on Pristiq.  Has a good support system at home.  Assessment/Plan:   1. Multiple thyroid nodules Avoid use of GLP-1 receptor agonist.  2. Vitamin D deficiency Refill- Vitamin D, Ergocalciferol, (DRISDOL) 1.25 MG (50000 UNIT) CAPS  capsule; Take 1 capsule (50,000 Units total) by mouth every 7 (seven) days.  Dispense: 6 capsule; Refill: 0  3. Hepatic steatosis Continue active plan for weight loss.  4. Other depression with emotional eating Continue CBT and stress reduction  5. Obesity,current BMI 57.61 1.  Track daily steps. 2.  Focus on protein intake with meals. 3.  Work on stress reduction.  Christine Mason is currently in the action stage of change. As such, her goal is to continue with weight loss efforts. She has agreed to the Category 3 Plan.   Exercise goals:  Walking goal greater than 8000 steps per day.  Behavioral modification strategies: increasing lean protein intake, increasing water intake, meal planning and cooking strategies, emotional eating strategies, and planning for success.  Christine Mason has agreed to follow-up with our clinic in 5 weeks. She was informed of the importance of frequent follow-up visits to maximize her success with intensive lifestyle modifications for her multiple health conditions.  Objective:   VITALS: Per patient if applicable, see vitals. GENERAL: Alert and in no acute distress. CARDIOPULMONARY: No increased WOB. Speaking in clear sentences.  PSYCH: Pleasant and cooperative. Speech normal rate and rhythm. Affect is appropriate. Insight and judgement are appropriate. Attention is focused, linear, and appropriate.  NEURO: Oriented as arrived to appointment on time with no prompting.   Lab Results  Component Value Date   CREATININE 0.77 06/27/2022   BUN 14 06/27/2022   NA 140 06/27/2022   K 4.5 06/27/2022   CL 99 06/27/2022   CO2 22 06/27/2022   Lab Results  Component Value Date   ALT 33 (H) 06/27/2022   AST 28 06/27/2022   ALKPHOS 76 06/27/2022   BILITOT 0.4 06/27/2022   Lab Results  Component Value Date   HGBA1C 5.6 06/27/2022   Lab Results  Component Value Date   INSULIN 13.5 06/27/2022   Lab Results  Component Value Date   TSH 3.340 06/27/2022   Lab Results   Component Value Date   CHOL 174 06/27/2022   HDL 63 06/27/2022   LDLCALC 89 06/27/2022   TRIG 126 06/27/2022   CHOLHDL 2.8 06/27/2022   Lab Results  Component Value Date   VD25OH 36.2 06/27/2022   Lab Results  Component Value Date   WBC 6.6 06/27/2022   HGB 13.3 06/27/2022   HCT 41.8 06/27/2022   MCV 94 06/27/2022   PLT 332 06/27/2022   No results found for: "IRON", "TIBC", "FERRITIN"  Attestation Statements:   Reviewed by clinician on day of visit: allergies, medications, problem list, medical history, surgical history, family history, social history, and previous encounter notes.  I, Davy Pique, am acting as Location manager for [name] Gambler, DO.  I have reviewed the above documentation for accuracy and completeness, and I agree with the above. Dell Ponto, DO

## 2022-09-06 ENCOUNTER — Ambulatory Visit (INDEPENDENT_AMBULATORY_CARE_PROVIDER_SITE_OTHER): Payer: 59

## 2022-09-06 DIAGNOSIS — J309 Allergic rhinitis, unspecified: Secondary | ICD-10-CM | POA: Diagnosis not present

## 2022-09-12 ENCOUNTER — Encounter (INDEPENDENT_AMBULATORY_CARE_PROVIDER_SITE_OTHER): Payer: Self-pay

## 2022-09-12 ENCOUNTER — Telehealth (INDEPENDENT_AMBULATORY_CARE_PROVIDER_SITE_OTHER): Payer: 59 | Admitting: Psychology

## 2022-09-13 ENCOUNTER — Ambulatory Visit (INDEPENDENT_AMBULATORY_CARE_PROVIDER_SITE_OTHER): Payer: 59

## 2022-09-13 DIAGNOSIS — J309 Allergic rhinitis, unspecified: Secondary | ICD-10-CM | POA: Diagnosis not present

## 2022-09-26 ENCOUNTER — Other Ambulatory Visit (HOSPITAL_COMMUNITY): Payer: Self-pay

## 2022-09-26 MED ORDER — TRAZODONE HCL 50 MG PO TABS
50.0000 mg | ORAL_TABLET | Freq: Every evening | ORAL | 3 refills | Status: DC
  Filled 2022-09-26 – 2022-09-27 (×3): qty 30, 30d supply, fill #0

## 2022-09-26 NOTE — Written Directive (Signed)
MOLECULAR IMAGING AND THERAPEUTICS WRITTEN DIRECTIVE   PATIENT NAME: Christine Mason  PT DOB:   06-14-1971                                              MRN: [id number]  ---------------------------------------------------------------------------------------------------------------------  I-131 WHOLE BODY SCAN    RADIOPHARMACEUTICAL: Iodine-131 Capsule for Diagnostic Imaging   PRESCRIBED DOSE FOR ADMINISTRATION:  4 mCi   ROUTE OFADMINISTRATION: PO   DIAGNOSIS: Thyroid Cancer   REFERRING PHYSICIAN: Dr. Kelton Pillar   THYROGEN STIMULATION OR HORMONE WITHDRAW: Thyrogen Stimulation   DATE OF THYROIDECTOMY: 07-01-2020   SURGEON:  Dr. Harlow Asa   TSH:   Lab Results  Component Value Date   TSH 3.340 06/27/2022   TSH 0.77 01/06/2022   TSH 2.15 06/24/2021     PRIOR I-131 THERAPY (Date and Dose): 98.4 mCi on 08-20-2020   ADDITIONAL PHYSICIAN COMMENTS/NOTES   AUTHORIZED USER SIGNATURE & TIME STAMP:

## 2022-09-27 ENCOUNTER — Other Ambulatory Visit: Payer: Self-pay

## 2022-09-27 ENCOUNTER — Other Ambulatory Visit (HOSPITAL_COMMUNITY): Payer: Self-pay

## 2022-09-27 ENCOUNTER — Encounter (HOSPITAL_COMMUNITY): Payer: Self-pay

## 2022-09-27 ENCOUNTER — Encounter (HOSPITAL_COMMUNITY)
Admission: RE | Admit: 2022-09-27 | Discharge: 2022-09-27 | Disposition: A | Discharge status: Home / Self Care | Payer: 59 | Source: Ambulatory Visit | Attending: Internal Medicine | Admitting: Internal Medicine

## 2022-09-27 DIAGNOSIS — C73 Malignant neoplasm of thyroid gland: Secondary | ICD-10-CM | POA: Diagnosis present

## 2022-09-27 MED ORDER — THYROTROPIN ALFA 0.9 MG IM SOLR: 0.9000 mg | INTRAMUSCULAR | Status: DC

## 2022-09-27 MED ORDER — THYROTROPIN ALFA 0.9 MG IM SOLR
INTRAMUSCULAR | Status: AC
  Administered 2022-09-27: 0.9 mg via INTRAMUSCULAR
  Filled 2022-09-27: qty 0.9

## 2022-09-27 MED ORDER — THYROTROPIN ALFA 0.9 MG IM SOLR: 0.9000 mg | INTRAMUSCULAR | Status: AC

## 2022-09-28 ENCOUNTER — Encounter (HOSPITAL_COMMUNITY)
Admission: RE | Admit: 2022-09-28 | Discharge: 2022-09-28 | Disposition: A | Discharge status: Home / Self Care | Payer: 59 | Source: Ambulatory Visit | Attending: Internal Medicine | Admitting: Internal Medicine

## 2022-09-28 DIAGNOSIS — C73 Malignant neoplasm of thyroid gland: Secondary | ICD-10-CM | POA: Diagnosis not present

## 2022-09-28 MED ORDER — THYROTROPIN ALFA 0.9 MG IM SOLR
INTRAMUSCULAR | Status: AC
  Administered 2022-09-28: 0.9 mg via INTRAMUSCULAR
  Filled 2022-09-28: qty 0.9

## 2022-09-28 MED ORDER — THYROTROPIN ALFA 0.9 MG IM SOLR: 0.9000 mg | INTRAMUSCULAR | Status: AC

## 2022-09-28 MED ORDER — STERILE WATER FOR INJECTION IJ SOLN
INTRAMUSCULAR | Status: AC
  Administered 2022-09-28: 1.2 mL via INTRAMUSCULAR
  Filled 2022-09-28: qty 10

## 2022-09-28 MED ORDER — STERILE WATER FOR INJECTION IJ SOLN: 1.2000 mL | Freq: Once | INTRAMUSCULAR | Status: AC

## 2022-09-29 ENCOUNTER — Encounter (HOSPITAL_COMMUNITY)
Admission: RE | Admit: 2022-09-29 | Discharge: 2022-09-29 | Disposition: A | Discharge status: Home / Self Care | Payer: 59 | Source: Ambulatory Visit | Attending: Internal Medicine | Admitting: Internal Medicine

## 2022-09-29 ENCOUNTER — Encounter (HOSPITAL_COMMUNITY): Payer: Self-pay

## 2022-09-29 DIAGNOSIS — C73 Malignant neoplasm of thyroid gland: Secondary | ICD-10-CM | POA: Insufficient documentation

## 2022-09-29 MED ORDER — SODIUM IODIDE I 131 CAPSULE
4.0000 | Freq: Once | INTRAVENOUS | Status: AC | PRN
  Administered 2022-09-29: 4.08 via ORAL

## 2022-10-02 ENCOUNTER — Encounter (HOSPITAL_COMMUNITY)
Admission: RE | Admit: 2022-10-02 | Discharge: 2022-10-02 | Disposition: A | Discharge status: Home / Self Care | Payer: 59 | Source: Ambulatory Visit | Attending: Internal Medicine | Admitting: Internal Medicine

## 2022-10-02 DIAGNOSIS — E890 Postprocedural hypothyroidism: Secondary | ICD-10-CM | POA: Diagnosis not present

## 2022-10-02 DIAGNOSIS — C73 Malignant neoplasm of thyroid gland: Secondary | ICD-10-CM | POA: Diagnosis not present

## 2022-10-03 ENCOUNTER — Encounter: Payer: Self-pay | Admitting: Internal Medicine

## 2022-10-03 DIAGNOSIS — C73 Malignant neoplasm of thyroid gland: Secondary | ICD-10-CM

## 2022-10-26 ENCOUNTER — Other Ambulatory Visit (HOSPITAL_COMMUNITY): Payer: Self-pay | Admitting: Surgery

## 2022-10-26 DIAGNOSIS — R221 Localized swelling, mass and lump, neck: Secondary | ICD-10-CM

## 2022-10-26 DIAGNOSIS — E890 Postprocedural hypothyroidism: Secondary | ICD-10-CM | POA: Diagnosis not present

## 2022-10-26 DIAGNOSIS — C73 Malignant neoplasm of thyroid gland: Secondary | ICD-10-CM

## 2022-11-01 ENCOUNTER — Ambulatory Visit (INDEPENDENT_AMBULATORY_CARE_PROVIDER_SITE_OTHER): Payer: 59

## 2022-11-01 DIAGNOSIS — J309 Allergic rhinitis, unspecified: Secondary | ICD-10-CM

## 2022-11-06 ENCOUNTER — Ambulatory Visit (HOSPITAL_COMMUNITY)
Admission: RE | Admit: 2022-11-06 | Discharge: 2022-11-06 | Disposition: A | Discharge status: Home / Self Care | Payer: 59 | Source: Ambulatory Visit | Attending: Surgery | Admitting: Surgery

## 2022-11-06 DIAGNOSIS — C73 Malignant neoplasm of thyroid gland: Secondary | ICD-10-CM | POA: Diagnosis not present

## 2022-11-06 DIAGNOSIS — E890 Postprocedural hypothyroidism: Secondary | ICD-10-CM | POA: Diagnosis not present

## 2022-11-06 DIAGNOSIS — J341 Cyst and mucocele of nose and nasal sinus: Secondary | ICD-10-CM | POA: Diagnosis not present

## 2022-11-06 DIAGNOSIS — R221 Localized swelling, mass and lump, neck: Secondary | ICD-10-CM | POA: Diagnosis not present

## 2022-11-06 MED ORDER — SODIUM CHLORIDE (PF) 0.9 % IJ SOLN
INTRAMUSCULAR | Status: AC
  Filled 2022-11-06: qty 50

## 2022-11-06 MED ORDER — IOHEXOL 300 MG/ML  SOLN
75.0000 mL | Freq: Once | INTRAMUSCULAR | Status: AC | PRN
  Administered 2022-11-06: 75 mL via INTRAVENOUS

## 2022-11-08 ENCOUNTER — Ambulatory Visit (INDEPENDENT_AMBULATORY_CARE_PROVIDER_SITE_OTHER): Payer: 59

## 2022-11-08 DIAGNOSIS — J309 Allergic rhinitis, unspecified: Secondary | ICD-10-CM | POA: Diagnosis not present

## 2022-11-14 NOTE — Progress Notes (Signed)
CT scan shows a suspicious lymph node in the lateral left neck, possibly an additional suspicous lymph node.  Telephone call to patient today to discuss.  Will make referral to Dr. Izora Gala at [facility] for consultation and surgery.  May need limited lymph node excision versus modified neck dissection   Will send referral today.  Armandina Gemma, MD [facility] [place] [facility] Office: [phone]

## 2022-11-16 ENCOUNTER — Encounter: Payer: Self-pay | Admitting: Radiology

## 2022-11-22 ENCOUNTER — Ambulatory Visit (INDEPENDENT_AMBULATORY_CARE_PROVIDER_SITE_OTHER): Payer: 59

## 2022-11-22 DIAGNOSIS — J309 Allergic rhinitis, unspecified: Secondary | ICD-10-CM | POA: Diagnosis not present

## 2022-11-24 ENCOUNTER — Ambulatory Visit
Admission: EM | Admit: 2022-11-24 | Discharge: 2022-11-24 | Disposition: A | Discharge status: Home / Self Care | Payer: 59 | Attending: Nurse Practitioner | Admitting: Nurse Practitioner

## 2022-11-24 DIAGNOSIS — M25572 Pain in left ankle and joints of left foot: Secondary | ICD-10-CM

## 2022-11-24 MED ORDER — DEXAMETHASONE SODIUM PHOSPHATE 10 MG/ML IJ SOLN: 10.0000 mg | Freq: Once | INTRAMUSCULAR | Status: DC

## 2022-11-24 MED ORDER — METHYLPREDNISOLONE 4 MG PO TBPK: ORAL_TABLET | ORAL | 0 refills | Status: AC

## 2022-11-24 MED ORDER — DEXAMETHASONE SODIUM PHOSPHATE 10 MG/ML IJ SOLN
10.0000 mg | Freq: Once | INTRAMUSCULAR | Status: AC
  Administered 2022-11-24: 10 mg via INTRAMUSCULAR

## 2022-11-24 NOTE — ED Provider Notes (Signed)
[facility]    CSN: [id number] Arrival date & time: 11/24/22  1053      History   Chief Complaint No chief complaint on file.   HPI Christine Mason is a 52 y.o. female.   HPI  She is in today for left ankle pain. 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 She denies any injury. She has suffered from pain in the past. She admits chronic pain treatment with Norco and tizanidine which was not effective for her pain. She does have swelling but this is a part of her normal. Denies shortness of breath, dyspnea on exertion, chest pain.   Past Medical History:  Diagnosis Date   Anxiety    Back pain    Carpal tunnel syndrome    bi lat hands   Chronic leg pain    right   Chronic pain of left knee    Depression    Diverticulitis    Fatty liver    GERD (gastroesophageal reflux disease)    Heart murmur    High cholesterol    History of kidney stones    Hypertension    Hyperthyroidism    Joint pain    Pre-diabetes    Sleep apnea     no C- Pap   Swallowing difficulty    Thyroid cancer [facility])     Patient Active Problem List   Diagnosis Date Noted   Multiple thyroid nodules 08/24/2022   Hepatic vein stenosis 08/24/2022   Depression 07/11/2022   Other fatigue 06/27/2022   SOBOE (shortness of breath on exertion) 06/27/2022   Osteoarthritis of both knees 06/27/2022   Pre-diabetes 06/27/2022   Other hyperlipidemia 06/27/2022   Depression screening 06/27/2022   Seasonal and perennial allergic rhinitis 03/08/2022   Wheeze 03/08/2022   Gastroesophageal reflux disease 03/08/2022   Hx of papillary thyroid carcinoma 04/14/2021   Status post total thyroidectomy 04/14/2021   Unilateral primary osteoarthritis, left knee 03/24/2021   Chronic pain of left knee 03/24/2021   Morbid obesity ([place]) 02/09/2021   Abnormal liver function tests 02/09/2021   Chest pain 02/09/2021   Chronic low back pain 02/09/2021   Generalized hyperhidrosis 02/09/2021   Major  depressive disorder 02/09/2021   Palpitations 02/09/2021   Urinary incontinence 02/09/2021   Impaired fasting glucose 02/01/2021   Essential hypertension 02/01/2021   Mixed hyperlipidemia 02/01/2021   Vitamin D deficiency 02/01/2021   Hypothyroidism 01/12/2021   Hypokalemia 01/12/2021   Papillary thyroid carcinoma ([place]) 07/14/2020   Neoplasm of uncertain behavior of thyroid gland 06/29/2020   Oropharyngeal dysphagia 05/06/2019   Dysphagia, pharyngoesophageal phase 05/05/2019   Vasomotor symptoms due to menopause 03/13/2019   Genetic testing 06/03/2015   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    Past Surgical History:  Procedure Laterality Date   ACNE CYST REMOVAL     CHOLECYSTECTOMY     DILATION AND CURETTAGE OF UTERUS  1998   ESOPHAGOGASTRODUODENOSCOPY (EGD) WITH PROPOFOL N/A 06/13/2019   Procedure: ESOPHAGOGASTRODUODENOSCOPY (EGD) WITH PROPOFOL;  Surgeon: Rogene Houston, MD;  Location: [facility];  Service: Endoscopy;  Laterality: N/A;  7:30   KNEE SURGERY     MALONEY DILATION  06/13/2019   Procedure: MALONEY DILATION;  Surgeon: Rogene Houston, MD;  Location: [facility];  Service: Endoscopy;;   MASS EXCISION     THORACOTOMY Right 2016   THYROIDECTOMY N/A 07/01/2020   Procedure: TOTAL THYROIDECTOMY WITH Central compartment lymph node disection;  Surgeon: Armandina Gemma,  MD;  Location: [facility];  Service: General;  Laterality: N/A;    OB History     Gravida  1   Para      Term      Preterm      AB  1   Living  0      SAB  1   IAB      Ectopic      Multiple      Live Births               Home Medications    Prior to Admission medications   Medication Sig Start Date End Date Taking? Authorizing Provider  methylPREDNISolone (MEDROL) 4 MG TBPK tablet Follow package instructions. 11/24/22 11/30/22 Yes Vevelyn Francois, NP  atenolol (TENORMIN) 50 MG tablet Take 1 tablet (50 mg total) by mouth daily. Patient taking  differently: Take 50 mg by mouth at bedtime. 04/19/18   Nicholas Lose, MD  Cholecalciferol (VITAMIN D3) 50 MCG (2000 UT) TABS Take 2,000 Units by mouth daily.    [provider]  desvenlafaxine (PRISTIQ) 100 MG 24 hr tablet Take 1 tablet (100 mg total) by mouth daily. 05/02/22   Nicholas Lose, MD  doxepin (SINEQUAN) 25 MG capsule TAKE (2) CAPSULES BY MOUTH ONCE AT BEDTIME. 07/06/21   Ambs, Kathrine Cords, FNP  EPINEPHrine 0.3 mg/0.3 mL IJ SOAJ injection Inject 0.3 mg into the muscle as needed for anaphylaxis. 03/08/22   Ambs, Kathrine Cords, FNP  Fluticasone Furoate (ARNUITY ELLIPTA) 100 MCG/ACT AEPB Inhale 1 puff into the lungs daily at 6 (six) AM. 03/08/22   Ambs, Kathrine Cords, FNP  HYDROcodone-acetaminophen (NORCO/VICODIN) 5-325 MG tablet Take 1 tablet by mouth every 6 (six) hours as needed for moderate pain. 12/11/20   [provider]  levocetirizine (XYZAL) 5 MG tablet TAKE (1) TABLET BY MOUTH ONCE EVERY EVENING. 07/03/22   Ambs, Kathrine Cords, FNP  levothyroxine (SYNTHROID) 200 MCG tablet Take 1 tablet (200 mcg total) by mouth daily. 07/17/22   Shamleffer, Melanie Crazier, MD  levothyroxine (SYNTHROID) 50 MCG tablet Take 1 tablet (50 mcg total) by mouth daily. To be taken WITH levothyroxine 200 mcg daily 07/14/22   Shamleffer, Melanie Crazier, MD  LORazepam (ATIVAN) 1 MG tablet Take 1 mg by mouth at bedtime.  04/02/20   [provider]  losartan (COZAAR) 100 MG tablet Take 100 mg by mouth daily.  12/09/18   [provider]  Multiple Vitamin (MULTIVITAMIN) tablet Take 2 tablets by mouth daily. gummy    [provider]  NEURONTIN 300 MG capsule TAKE 1 CAPSULE BY MOUTH ONCE A DAY AT BEDTIME 08/03/22   Carole Civil, MD  ondansetron (ZOFRAN ODT) 4 MG disintegrating tablet Take 1 tablet (4 mg total) by mouth every 8 (eight) hours as needed for nausea or vomiting. 12/21/20   Noemi Chapel, MD  oxybutynin (DITROPAN-XL) 10 MG 24 hr tablet Take 1 tablet by mouth 2 (two) times daily.     [provider]  pantoprazole (PROTONIX) 40 MG tablet Take 40 mg by mouth 2 (two) times daily.    [provider]  rosuvastatin (CRESTOR) 10 MG tablet Take 10 mg by mouth daily. 11/09/20   [provider]  tiZANidine (ZANAFLEX) 4 MG tablet Take 4 mg by mouth at bedtime. 05/24/22   [provider]  Vitamin D, Ergocalciferol, (DRISDOL) 1.25 MG (50000 UNIT) CAPS capsule Take 1 capsule (50,000 Units total) by mouth every 7 (seven) days. 08/24/22   Bowen, Santiago Glad  E, DO  zolpidem (AMBIEN) 10 MG tablet Take 10 mg by mouth at bedtime as needed. 03/02/22   [provider]  traZODone (DESYREL) 50 MG tablet Take 1 tablet (50 mg total) by mouth at bedtime. 09/26/22 09/27/22      Family History Family History  Problem Relation Age of Onset   Sleep apnea Mother    Cancer Mother    Hyperlipidemia Mother    Diabetes Mother    Hypertension Mother    Breast cancer Mother 61       dx again at 25; PALB2 and CHEK2 positive   Depression Mother    Obesity Mother    Stroke Father    Depression Father    Obesity Father    Hyperlipidemia Father    Diabetes Father    Hypertension Father    Coronary artery disease Father    Sleep apnea Father    Heart disease Father    Hypertension Sister    Breast cancer Sister        diagnosed in her 80's   Lung cancer Maternal Grandmother    Leukemia Maternal Grandfather 42       dx with hairy cell leukemia at 63; CLL at 59   Lymphoma Maternal Grandfather 82       hodgkins lymphoma   Brain cancer Paternal Grandmother    Prostate cancer Paternal Grandfather    Lung cancer Maternal Aunt 57   Thyroid cancer Maternal Uncle 46       medullary   Lymphoma Maternal Uncle 60       follicular lymphoma   Allergic rhinitis Neg Hx    Asthma Neg Hx     Social History Social History   Tobacco Use   Smoking status: Former    Packs/day: 0.50    Years: 5.00    Total pack years: 2.50    Types: Cigarettes    Quit date: 09/15/2017     Years since quitting: 5.1   Smokeless tobacco: Never  Vaping Use   Vaping Use: Never used  Substance Use Topics   Alcohol use: No   Drug use: No     Allergies   Penicillins   Review of Systems Review of Systems   Physical Exam Triage Vital Signs ED Triage Vitals  Enc Vitals Group     BP 11/24/22 1127 (!) 141/87     Pulse Rate 11/24/22 1127 74     Resp 11/24/22 1127 20     Temp 11/24/22 1127 (!) 97.5 F (36.4 C)     Temp Source 11/24/22 1127 Oral     SpO2 11/24/22 1127 95 %     Weight --      Height --      Head Circumference --      Peak Flow --      Pain Score 11/24/22 1131 8     Pain Loc --      Pain Edu? --      Excl. in [place]? --    No data found.  Updated Vital Signs BP (!) 141/87 (BP Location: Right Arm)   Pulse 74   Temp (!) 97.5 F (36.4 C) (Oral)   Resp 20   LMP 05/22/2015   SpO2 95%   Visual Acuity Right Eye Distance:   Left Eye Distance:   Bilateral Distance:    Right Eye Near:   Left Eye Near:    Bilateral Near:     Physical Exam Constitutional:  General: She is not in acute distress.    Appearance: She is obese.  HENT:     Head: Normocephalic and atraumatic.  Cardiovascular:     Rate and Rhythm: Normal rate.  Pulmonary:     Effort: Pulmonary effort is normal.  Musculoskeletal:        General: Swelling (mild) and tenderness present.     Comments: WC  Skin:    General: Skin is warm and dry.     Capillary Refill: Capillary refill takes less than 2 seconds.  Neurological:     General: No focal deficit present.     Mental Status: She is alert and oriented to person, place, and time.  Psychiatric:        Mood and Affect: Mood normal.        Behavior: Behavior normal.      UC Treatments / Results  Labs (all labs ordered are listed, but only abnormal results are displayed) Labs Reviewed - No data to display  EKG   Radiology No results found.  Procedures Procedures (including critical care time)  Medications  Ordered in UC Medications  dexamethasone (DECADRON) injection 10 mg (10 mg Intramuscular Given 11/24/22 1200)    Initial Impression / Assessment and Plan / UC Course  I have reviewed the triage vital signs and the nursing notes.  Pertinent labs & imaging results that were available during my care of the patient were reviewed by me and considered in my medical decision making (see chart for details).   Ankle Final Clinical Impressions(s) / UC Diagnoses   Final diagnoses:  Left ankle pain, unspecified chronicity   Discharge Instructions   None    ED Prescriptions     Medication Sig Dispense Auth. Provider   methylPREDNISolone (MEDROL) 4 MG TBPK tablet Follow package instructions. 21 tablet Vevelyn Francois, NP      PDMP not reviewed this encounter.   Dionisio David [place], [place] 11/24/22 [phone]

## 2022-11-24 NOTE — ED Triage Notes (Signed)
Pt reports her left ankle is swollen and painful x 1 day.  Pt reports no injury to the ankle. Took hydrocodone and tizanidine but no relief.

## 2022-12-01 ENCOUNTER — Ambulatory Visit (INDEPENDENT_AMBULATORY_CARE_PROVIDER_SITE_OTHER): Payer: 59 | Admitting: *Deleted

## 2022-12-01 DIAGNOSIS — J309 Allergic rhinitis, unspecified: Secondary | ICD-10-CM | POA: Diagnosis not present

## 2022-12-07 ENCOUNTER — Other Ambulatory Visit (HOSPITAL_COMMUNITY)
Admission: AD | Admit: 2022-12-07 | Discharge: 2022-12-07 | Disposition: A | Discharge status: Home / Self Care | Payer: 59 | Source: Ambulatory Visit | Attending: Otolaryngology | Admitting: Otolaryngology

## 2022-12-07 DIAGNOSIS — C73 Malignant neoplasm of thyroid gland: Secondary | ICD-10-CM | POA: Diagnosis not present

## 2022-12-07 DIAGNOSIS — E890 Postprocedural hypothyroidism: Secondary | ICD-10-CM | POA: Diagnosis not present

## 2022-12-07 DIAGNOSIS — Z85850 Personal history of malignant neoplasm of thyroid: Secondary | ICD-10-CM | POA: Diagnosis not present

## 2022-12-07 DIAGNOSIS — R221 Localized swelling, mass and lump, neck: Secondary | ICD-10-CM | POA: Insufficient documentation

## 2022-12-08 LAB — THYROGLOBULIN ANTIBODY: Thyroglobulin Antibody: <1 IU/mL (ref 0.0–0.9)

## 2022-12-10 ENCOUNTER — Encounter: Payer: Self-pay | Admitting: Internal Medicine

## 2022-12-12 ENCOUNTER — Other Ambulatory Visit: Payer: Self-pay | Admitting: Hematology and Oncology

## 2022-12-12 ENCOUNTER — Ambulatory Visit
Admission: RE | Admit: 2022-12-12 | Discharge: 2022-12-12 | Disposition: A | Discharge status: Home / Self Care | Payer: 59 | Source: Ambulatory Visit | Attending: Hematology and Oncology | Admitting: Hematology and Oncology

## 2022-12-12 DIAGNOSIS — Z1501 Genetic susceptibility to malignant neoplasm of breast: Secondary | ICD-10-CM

## 2022-12-12 DIAGNOSIS — Z1239 Encounter for other screening for malignant neoplasm of breast: Secondary | ICD-10-CM | POA: Diagnosis not present

## 2022-12-12 DIAGNOSIS — R9389 Abnormal findings on diagnostic imaging of other specified body structures: Secondary | ICD-10-CM

## 2022-12-12 MED ORDER — GADOPICLENOL 0.5 MMOL/ML IV SOLN
10.0000 mL | Freq: Once | INTRAVENOUS | Status: AC | PRN
  Administered 2022-12-12: 10 mL via INTRAVENOUS

## 2022-12-13 ENCOUNTER — Ambulatory Visit: Payer: 59 | Admitting: Internal Medicine

## 2022-12-13 ENCOUNTER — Telehealth: Payer: Self-pay | Admitting: Adult Health

## 2022-12-13 NOTE — Telephone Encounter (Signed)
Called and reviewed recent breast MRI results.  Discussed with patient in detail.  She understands and is already scheduled for MRI guided biopsy on 12/22/2022 at [facility].    Wilber Bihari, NP 12/13/22 3:38 PM Medical Oncology and Hematology [facility] [place], [place] [address] Tel. [phone]    Fax. [phone]

## 2022-12-22 ENCOUNTER — Ambulatory Visit
Admission: RE | Admit: 2022-12-22 | Discharge: 2022-12-22 | Disposition: A | Discharge status: Home / Self Care | Payer: 59 | Source: Ambulatory Visit | Attending: Hematology and Oncology | Admitting: Hematology and Oncology

## 2022-12-22 ENCOUNTER — Ambulatory Visit: Payer: 59 | Admitting: Allergy & Immunology

## 2022-12-22 DIAGNOSIS — N6314 Unspecified lump in the right breast, lower inner quadrant: Secondary | ICD-10-CM | POA: Diagnosis not present

## 2022-12-22 DIAGNOSIS — R9389 Abnormal findings on diagnostic imaging of other specified body structures: Secondary | ICD-10-CM

## 2022-12-22 HISTORY — PX: BREAST BIOPSY: SHX20

## 2022-12-22 MED ORDER — GADOPICLENOL 0.5 MMOL/ML IV SOLN: 10.0000 mL | Freq: Once | INTRAVENOUS | Status: DC | PRN

## 2022-12-27 DIAGNOSIS — J3089 Other allergic rhinitis: Secondary | ICD-10-CM

## 2022-12-27 NOTE — Progress Notes (Signed)
VIALS EXP 12-27-23 

## 2023-01-08 ENCOUNTER — Other Ambulatory Visit: Payer: Self-pay | Admitting: Orthopedic Surgery

## 2023-01-08 DIAGNOSIS — G5603 Carpal tunnel syndrome, bilateral upper limbs: Secondary | ICD-10-CM

## 2023-01-15 ENCOUNTER — Ambulatory Visit: Payer: 59 | Admitting: Internal Medicine

## 2023-02-06 ENCOUNTER — Other Ambulatory Visit (HOSPITAL_COMMUNITY): Payer: Self-pay

## 2023-02-07 ENCOUNTER — Other Ambulatory Visit (HOSPITAL_COMMUNITY): Payer: Self-pay

## 2023-02-07 ENCOUNTER — Other Ambulatory Visit: Payer: Self-pay

## 2023-02-08 ENCOUNTER — Other Ambulatory Visit (HOSPITAL_COMMUNITY): Payer: Self-pay

## 2023-02-08 ENCOUNTER — Other Ambulatory Visit: Payer: Self-pay

## 2023-02-08 MED ORDER — TIZANIDINE HCL 4 MG PO TABS
4.0000 mg | ORAL_TABLET | Freq: Three times a day (TID) | ORAL | 0 refills | Status: DC | PRN
  Filled 2023-02-08: qty 30, 10d supply, fill #0

## 2023-02-09 ENCOUNTER — Other Ambulatory Visit (HOSPITAL_COMMUNITY): Payer: Self-pay

## 2023-02-15 ENCOUNTER — Other Ambulatory Visit: Payer: Self-pay | Admitting: Emergency Medicine

## 2023-02-15 NOTE — H&P (Signed)
HPI:  Christine Mason is a 52 y.o. female who presents as a consult Patient.  Referring Provider: Dartha Lodge, MD  Chief complaint: Thyroid cancer.  HPI: History of thyroidectomy for papillary thyroid cancer about 3 to 4 years ago. She underwent radioactive iodine treatment following that. She has had some recent imaging which shows a suspicious lymph node in the left side level 4 and another 1 that is even smaller and level 2. She has no symptoms related to this. Radioactive iodine scan was negative. Most recent thyroglobulin assays were low and not increasing.  PMH/Meds/All/SocHx/FamHx/ROS:  Past Medical History: Diagnosis Date Hyperlipidemia Hypertension Papillary carcinoma (CMS/HCC)  Past Surgical History: Procedure Laterality Date THYROIDECTOMY 06/2020  No family history of bleeding disorders, wound healing problems or difficulty with anesthesia.    Current Outpatient Medications: atenoloL (TENORMIN) 50 mg tablet, , Disp: , Rfl: gabapentin (NEURONTIN) 300 mg capsule, , Disp: , Rfl: HYDROcodone-acetaminophen (NORCO) 5-325 mg per tablet, , Disp: , Rfl: levocetirizine (XYZAL) 5 mg tablet, , Disp: , Rfl: levothyroxine (SYNTHROID) 200 mcg tablet, , Disp: , Rfl: levothyroxine (SYNTHROID) 50 mcg tablet, , Disp: , Rfl: LORazepam (ATIVAN) 1 mg tablet, , Disp: , Rfl: losartan (COZAAR) 100 mg tablet, , Disp: , Rfl: oxybutynin (DITROPAN XL) 10 mg 24 hr tablet, , Disp: , Rfl: pantoprazole (PROTONIX) 40 mg EC tablet, , Disp: , Rfl: rosuvastatin (CRESTOR) 10 mg tablet, , Disp: , Rfl: tiZANidine (ZANAFLEX) 4 mg tablet, Take 4 mg by mouth nightly., Disp: , Rfl:  A complete ROS was performed with pertinent positives/negatives noted in the HPI. The remainder of the ROS are negative.   Physical Exam:  Pulse 76  Temp 97.7 F (36.5 C) (Temporal)  Resp 18  Ht 1.575 m (5\' 2" )  Wt (!) 142 kg (313 lb 6.4 oz)  SpO2 98%  BMI 57.32 kg/m  General: Healthy and alert, in no distress,  breathing easily. Normal affect. In a pleasant mood. Head: Normocephalic, atraumatic. No masses, or scars. Eyes: Pupils are equal, and reactive to light. Vision is grossly intact. No spontaneous or gaze nystagmus. Ears: Ear canals are clear. Tympanic membranes are intact, with normal landmarks and the middle ears are clear and healthy. Hearing: Grossly normal. Nose: Nasal cavities are clear with healthy mucosa, no polyps or exudate. Airways are patent. Face: No masses or scars, facial nerve function is symmetric. Oral Cavity: No mucosal abnormalities are noted. Tongue with normal mobility. Dentition appears healthy. Oropharynx: Tonsils are symmetric. There are no mucosal masses identified. Tongue base appears normal and healthy. Larynx/Hypopharynx: indirect exam reveals healthy, mobile vocal cords, without mucosal lesions in the hypopharynx or larynx. Chest: Deferred Neck: No healed thyroidectomy scar. No palpable masses, no cervical adenopathy, no thyroid nodules or enlargement. Neuro: Cranial nerves II-XII with normal function. Balance: Normal gate. Other findings: none.  Independent Review of Additional Tests or Records: 07/01/2020 surgical pathology:  FINAL MICROSCOPIC DIAGNOSIS:  A. THYROID, TOTAL THYROIDECTOMY: - Papillary thyroid carcinoma, classical variant, 1 cm, involving inferior pole of the right lobe - Papillary thyroid carcinoma, classical variant, involving left lobe - Carcinoma is confined to thyroid gland without extrathyroidal extension - Resection margins are negative for carcinoma - Negative for lymphovascular invasion - Benign hyperplastic nodule, 2.5 cm, of the right lobe - See oncology table  B. LYMPH NODES, CENTRAL COMPARTMENT, RESECTION: - 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) - Focal extranodal extension is present  11/06/2022 CT neck:  IMPRESSION: 1. Status post thyroidectomy without abnormal  soft tissue density in the  thyroidectomy bed. 2. 1.2 cm x 1.5 cm partially calcified left level IV node suspicious for nodal metastatic disease. A 0.8 cm short axis left level II node is indeterminate. No other pathologic lymphadenopathy in the imaged neck.  10/02/2022 radioactive iodine scan:  FINDINGS: No focal activity in thyroid bed. No abnormal uptake on whole-body I 131 scan. Physiologic uptake noted in the GI and GU tract.  IMPRESSION: No evidence of local thyroid cancer recurrence or distant metastatic disease.  Procedures: none  Impression & Plans: History of papillary thyroid cancer with a suspicious lymph node in the left level 4. It is nonpalpable. Another even smaller node in level 2 that is also nonpalpable. These did not show up on radioactive iodine scan. Recommend check thyroglobulin level and if there is any indication that it is elevated then we may need to consider repeat biopsy of the lymph node or possibly nodal biopsy with excision of of the node and/or neck dissection.

## 2023-02-16 ENCOUNTER — Telehealth: Payer: Self-pay | Admitting: Emergency Medicine

## 2023-02-16 ENCOUNTER — Ambulatory Visit (HOSPITAL_COMMUNITY)
Admission: RE | Admit: 2023-02-16 | Discharge: 2023-02-16 | Disposition: A | Discharge status: Home / Self Care | Payer: 59 | Source: Ambulatory Visit | Attending: Internal Medicine | Admitting: Internal Medicine

## 2023-02-16 ENCOUNTER — Other Ambulatory Visit: Payer: Self-pay

## 2023-02-16 ENCOUNTER — Other Ambulatory Visit (HOSPITAL_COMMUNITY): Payer: Self-pay

## 2023-02-16 ENCOUNTER — Ambulatory Visit
Admission: RE | Admit: 2023-02-16 | Discharge: 2023-02-16 | Disposition: A | Discharge status: Home / Self Care | Payer: 59 | Source: Ambulatory Visit | Attending: Internal Medicine | Admitting: Internal Medicine

## 2023-02-16 ENCOUNTER — Ambulatory Visit: Payer: 59

## 2023-02-16 VITALS — BP 176/76 | HR 81 | Temp 97.6°F | Resp 20

## 2023-02-16 DIAGNOSIS — M5442 Lumbago with sciatica, left side: Secondary | ICD-10-CM | POA: Diagnosis not present

## 2023-02-16 DIAGNOSIS — M4185 Other forms of scoliosis, thoracolumbar region: Secondary | ICD-10-CM | POA: Diagnosis not present

## 2023-02-16 DIAGNOSIS — M5136 Other intervertebral disc degeneration, lumbar region: Secondary | ICD-10-CM | POA: Diagnosis not present

## 2023-02-16 DIAGNOSIS — M545 Low back pain, unspecified: Secondary | ICD-10-CM | POA: Diagnosis not present

## 2023-02-16 LAB — POCT URINALYSIS DIP (MANUAL ENTRY)
Bilirubin, UA: NEGATIVE
Blood, UA: NEGATIVE
Glucose, UA: NEGATIVE mg/dL
Ketones, POC UA: NEGATIVE mg/dL
Nitrite, UA: NEGATIVE
Protein Ur, POC: NEGATIVE mg/dL
Spec Grav, UA: >=1.03 — AB (ref 1.010–1.025)
Urobilinogen, UA: 0.2 E.U./dL
pH, UA: 6 (ref 5.0–8.0)

## 2023-02-16 MED ORDER — ONDANSETRON 4 MG PO TBDP
4.0000 mg | ORAL_TABLET | Freq: Three times a day (TID) | ORAL | 0 refills | Status: DC | PRN
  Filled 2023-02-16: qty 10, 4d supply, fill #0

## 2023-02-16 MED ORDER — ONDANSETRON 4 MG PO TBDP: 4.0000 mg | ORAL_TABLET | Freq: Three times a day (TID) | ORAL | 0 refills | Status: AC | PRN

## 2023-02-16 MED ORDER — OXYCODONE-ACETAMINOPHEN 5-325 MG PO TABS: 1.0000 | ORAL_TABLET | Freq: Four times a day (QID) | ORAL | 0 refills | Status: DC | PRN

## 2023-02-16 MED ORDER — METHYLPREDNISOLONE 4 MG PO TBPK
ORAL_TABLET | ORAL | 0 refills | Status: DC
Start: 2023-02-16 — End: 2023-03-15

## 2023-02-16 MED ORDER — LIDOCAINE 5 % EX PTCH: 1.0000 | MEDICATED_PATCH | CUTANEOUS | 0 refills | Status: DC

## 2023-02-16 NOTE — Telephone Encounter (Signed)
[facility] did not receive Rx for Zofran.  Rx resent.

## 2023-02-16 NOTE — Discharge Instructions (Addendum)
I will call you as soon as I see the results of the xray.

## 2023-02-16 NOTE — ED Notes (Signed)
Pts BP 167/106 third attempt.

## 2023-02-16 NOTE — ED Triage Notes (Signed)
Pt reports she has low back pain that started 1 week ago. Pt reports when she sits down her back hurts the worse. Pt took hydrocodone and tizanidine but no relief.   Pt denies injury. Pt states history of kidney stones.

## 2023-02-16 NOTE — ED Provider Notes (Signed)
[facility]    CSN: [id number] Arrival date & time: 02/16/23  4098      History   Chief Complaint No chief complaint on file.   HPI Christine Mason is a 52 y.o. female who presents with low back pain x 1 week. Pain is worse when she sits down. Has never had pain like this before, she has been in tears due to severe pain. Has taken Norco and Zanaflex and has not helped. She has chronic frequency and is on med for this, but is not helping. She denies dysuria or cloudy urine.  Has had back pain in the past, but not like this. Denies paresthesia, but has felt pain radiate to her L posterior thigh to mid level. She has history of thyroid cancer and will have neck dissection next week and wants to be well. She denies history of any disc issues.    Past Medical History:  Diagnosis Date   Anxiety    Back pain    Carpal tunnel syndrome    bi lat hands   Chronic leg pain    right   Chronic pain of left knee    Depression    Diverticulitis    Fatty liver    GERD (gastroesophageal reflux disease)    Heart murmur    High cholesterol    History of kidney stones    Hypertension    Hyperthyroidism    Joint pain    Pre-diabetes    Sleep apnea     no C- Pap   Swallowing difficulty    Thyroid cancer [facility])     Patient Active Problem List   Diagnosis Date Noted   Multiple thyroid nodules 08/24/2022   Hepatic vein stenosis 08/24/2022   Depression 07/11/2022   Other fatigue 06/27/2022   SOBOE (shortness of breath on exertion) 06/27/2022   Osteoarthritis of both knees 06/27/2022   Pre-diabetes 06/27/2022   Other hyperlipidemia 06/27/2022   Depression screening 06/27/2022   Seasonal and perennial allergic rhinitis 03/08/2022   Wheeze 03/08/2022   Gastroesophageal reflux disease 03/08/2022   Hx of papillary thyroid carcinoma 04/14/2021   Status post total thyroidectomy 04/14/2021   Unilateral primary osteoarthritis, left knee 03/24/2021   Chronic pain of left knee  03/24/2021   Morbid obesity (HCC) 02/09/2021   Abnormal liver function tests 02/09/2021   Chest pain 02/09/2021   Chronic low back pain 02/09/2021   Generalized hyperhidrosis 02/09/2021   Major depressive disorder 02/09/2021   Palpitations 02/09/2021   Urinary incontinence 02/09/2021   Impaired fasting glucose 02/01/2021   Essential hypertension 02/01/2021   Mixed hyperlipidemia 02/01/2021   Vitamin D deficiency 02/01/2021   Hypothyroidism 01/12/2021   Hypokalemia 01/12/2021   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   Neoplasm of uncertain behavior of thyroid gland 06/29/2020   Oropharyngeal dysphagia 05/06/2019   Dysphagia, pharyngoesophageal phase 05/05/2019   Vasomotor symptoms due to menopause 03/13/2019   Genetic testing 06/03/2015   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    Past Surgical History:  Procedure Laterality Date   ACNE CYST REMOVAL     BREAST BIOPSY Right 12/22/2022   CHOLECYSTECTOMY     DILATION AND CURETTAGE OF UTERUS  1998   ESOPHAGOGASTRODUODENOSCOPY (EGD) WITH PROPOFOL N/A 06/13/2019   Procedure: ESOPHAGOGASTRODUODENOSCOPY (EGD) WITH PROPOFOL;  Surgeon: Malissa Hippo, MD;  Location: [facility];  Service: Endoscopy;  Laterality: N/A;  7:30   KNEE SURGERY     MALONEY DILATION  06/13/2019   Procedure:  MALONEY DILATION;  Surgeon: Malissa Hippo, MD;  Location: [facility];  Service: Endoscopy;;   MASS EXCISION     THORACOTOMY Right 2016   THYROIDECTOMY N/A 07/01/2020   Procedure: TOTAL THYROIDECTOMY WITH Central compartment lymph node disection;  Surgeon: Darnell Level, MD;  Location: [facility];  Service: General;  Laterality: N/A;    OB History     Gravida  1   Para      Term      Preterm      AB  1   Living  0      SAB  1   IAB      Ectopic      Multiple      Live Births               Home Medications    Prior to Admission medications   Medication Sig Start Date End Date Taking?  Authorizing Provider  lidocaine (LIDODERM) 5 % Place 1 patch onto the skin daily. Remove & Discard patch within 12 hours or as directed by MD 02/16/23  Yes Rodriguez-Southworth, Nettie Elm, PA-C  oxyCODONE-acetaminophen (PERCOCET/ROXICET) 5-325 MG tablet Take 1-2 tablets by mouth every 6 (six) hours as needed for severe pain. 02/16/23  Yes Rodriguez-Southworth, Nettie Elm, PA-C  atenolol (TENORMIN) 50 MG tablet Take 1 tablet (50 mg total) by mouth daily. Patient taking differently: Take 50 mg by mouth at bedtime. 04/19/18   Serena Croissant, MD  Cholecalciferol (VITAMIN D3) 50 MCG (2000 UT) TABS Take 2,000 Units by mouth daily.    [provider]  desvenlafaxine (PRISTIQ) 100 MG 24 hr tablet Take 1 tablet (100 mg total) by mouth daily. 05/02/22   Serena Croissant, MD  EPINEPHrine 0.3 mg/0.3 mL IJ SOAJ injection Inject 0.3 mg into the muscle as needed for anaphylaxis. 03/08/22   Hetty Blend, FNP  HYDROcodone-acetaminophen (NORCO/VICODIN) 5-325 MG tablet Take 1 tablet by mouth every 6 (six) hours as needed for moderate pain. 12/11/20   [provider]  levocetirizine (XYZAL) 5 MG tablet TAKE (1) TABLET BY MOUTH ONCE EVERY EVENING. 07/03/22   Ambs, Norvel Richards, FNP  levothyroxine (SYNTHROID) 200 MCG tablet Take 1 tablet (200 mcg total) by mouth daily. 07/17/22   Shamleffer, Konrad Dolores, MD  levothyroxine (SYNTHROID) 50 MCG tablet Take 1 tablet (50 mcg total) by mouth daily. To be taken WITH levothyroxine 200 mcg daily 07/14/22   Shamleffer, Konrad Dolores, MD  LORazepam (ATIVAN) 1 MG tablet Take 1 mg by mouth at bedtime.  04/02/20   [provider]  losartan (COZAAR) 100 MG tablet Take 100 mg by mouth daily.  12/09/18   [provider]  methylPREDNISolone (MEDROL DOSEPAK) 4 MG TBPK tablet Take as directed 02/16/23   Rodriguez-Southworth, Nettie Elm, PA-C  Multiple Vitamin (MULTIVITAMIN) tablet Take 2 tablets by mouth daily. gummy    [provider]  NEURONTIN 300 MG capsule TAKE 1  CAPSULE BY MOUTH ONCE A DAY AT BEDTIME 01/09/23   Vickki Hearing, MD  NON FORMULARY Pt uses a cpap nightly    [provider]  ondansetron (ZOFRAN ODT) 4 MG disintegrating tablet Take 1 tablet (4 mg total) by mouth every 8 (eight) hours as needed for nausea or vomiting. 02/16/23   Rodriguez-Southworth, Nettie Elm, PA-C  oxybutynin (DITROPAN-XL) 10 MG 24 hr tablet Take 10 mg by mouth 2 (two) times daily.    [provider]  pantoprazole (PROTONIX) 40 MG tablet Take 40 mg by mouth 2 (two) times daily.  [provider]  rosuvastatin (CRESTOR) 10 MG tablet Take 10 mg by mouth daily. 11/09/20   [provider]  tiZANidine (ZANAFLEX) 4 MG tablet Take 1 tablet (4 mg total) by mouth every 8 (eight) hours as needed for 10 days. 02/17/22     traZODone (DESYREL) 50 MG tablet Take 1 tablet (50 mg total) by mouth at bedtime. 09/26/22 09/27/22      Family History Family History  Problem Relation Age of Onset   Sleep apnea Mother    Cancer Mother    Hyperlipidemia Mother    Diabetes Mother    Hypertension Mother    Breast cancer Mother 76       dx again at 14; PALB2 and CHEK2 positive   Depression Mother    Obesity Mother    Stroke Father    Depression Father    Obesity Father    Hyperlipidemia Father    Diabetes Father    Hypertension Father    Coronary artery disease Father    Sleep apnea Father    Heart disease Father    Hypertension Sister    Breast cancer Sister        diagnosed in her 26's   Lung cancer Maternal Grandmother    Leukemia Maternal Grandfather 51       dx with hairy cell leukemia at 44; CLL at 25   Lymphoma Maternal Grandfather 21       hodgkins lymphoma   Brain cancer Paternal Grandmother    Prostate cancer Paternal Grandfather    Lung cancer Maternal Aunt 57   Thyroid cancer Maternal Uncle 46       medullary   Lymphoma Maternal Uncle 60       follicular lymphoma   Allergic rhinitis Neg Hx    Asthma Neg Hx     Social  History Social History   Tobacco Use   Smoking status: Former    Packs/day: 0.50    Years: 5.00    Additional pack years: 0.00    Total pack years: 2.50    Types: Cigarettes    Quit date: 09/15/2017    Years since quitting: 5.4   Smokeless tobacco: Never  Vaping Use   Vaping Use: Never used  Substance Use Topics   Alcohol use: No   Drug use: No     Allergies   Penicillins   Review of Systems Review of Systems  Constitutional:  Negative for fever.  Respiratory:  Negative for chest tightness and shortness of breath.   Cardiovascular:  Negative for chest pain.  Gastrointestinal:  Negative for abdominal pain.  Genitourinary:  Positive for frequency. Negative for flank pain and urgency.  Musculoskeletal:  Positive for back pain.  Skin:  Negative for rash.  Neurological:  Negative for weakness and numbness.     Physical Exam Triage Vital Signs ED Triage Vitals  Enc Vitals Group     BP 02/16/23 0956 (!) 165/105     Pulse Rate 02/16/23 0956 81     Resp 02/16/23 0956 20     Temp 02/16/23 0956 97.6 F (36.4 C)     Temp Source 02/16/23 0956 Oral     SpO2 02/16/23 0956 95 %     Weight --      Height --      Head Circumference --      Peak Flow --      Pain Score 02/16/23 0959 7     Pain Loc --  Pain Edu? --      Excl. in GC? --    No data found.  Updated Vital Signs BP (!) 176/76 (BP Location: Right Wrist)   Pulse 81   Temp 97.6 F (36.4 C) (Oral)   Resp 20   LMP 05/22/2015   SpO2 95%   Visual Acuity Right Eye Distance:   Left Eye Distance:   Bilateral Distance:    Right Eye Near:   Left Eye Near:    Bilateral Near:     Physical Exam Vitals and nursing note reviewed.  Constitutional:      General: She is in acute distress.     Appearance: She is obese.     Comments: She is in a lot pain  HENT:     Right Ear: External ear normal.     Left Ear: External ear normal.  Eyes:     General: No scleral icterus.    Conjunctiva/sclera:  Conjunctivae normal.  Pulmonary:     Effort: Pulmonary effort is normal.  Musculoskeletal:        General: Normal range of motion.     Cervical back: Neck supple.     Comments: BACK- with local tenderness on L lower lumbar-sacral soft tissue region. Pain provoked with L lateral flexion and anterior flexion >60 degrees. Has negative SLR  Skin:    General: Skin is warm and dry.     Findings: No bruising, erythema, lesion or rash.  Neurological:     Mental Status: She is alert and oriented to person, place, and time.     Gait: Gait normal.     Deep Tendon Reflexes: Reflexes normal.  Psychiatric:        Mood and Affect: Mood normal.        Behavior: Behavior normal.        Thought Content: Thought content normal.        Judgment: Judgment normal.      UC Treatments / Results  Labs (all labs ordered are listed, but only abnormal results are displayed) Labs Reviewed  POCT URINALYSIS DIP (MANUAL ENTRY) - Abnormal; Notable for the following components:      Result Value   Spec Grav, UA >=1.030 (*)    Leukocytes, UA Trace (*)    All other components within normal limits  UA negative  EKG   Radiology DG Lumbar Spine Complete  Result Date: 02/16/2023 CLINICAL DATA:  Low back pain for the past week. EXAM: LUMBAR SPINE - COMPLETE 4+ VIEW COMPARISON:  None Available. FINDINGS: There are 5 non rib-bearing lumbar type vertebral bodies. The provided lateral radiographs are degraded due to obliquity. Mild scoliotic curvature of the thoracolumbar spine with dominant caudal component convex the left measuring approximately 9 degrees (as measured from the superior endplate of T12 to the superior endplate of L5). No definite anterolisthesis or retrolisthesis. Lumbar vertebral body heights are preserved. Mild multilevel lumbar spine DDD, worse at L2-L3 and L3-L4 with disc space height loss, endplate irregularity and sclerosis. Limited visualization of the bilateral SI joints is normal.  Postcholecystectomy. Nonobstructive bowel gas pattern. IMPRESSION: 1. 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 2. Mild scoliotic curvature of the thoracolumbar spine with dominant caudal component convex the left measuring 9 degrees. Electronically Signed   By: Simonne Come M.D.   On: 02/16/2023 12:02    Procedures Procedures (including critical care time)  Medications Ordered in UC Medications - No data to display  Initial Impression / Assessment and  Plan / UC Course  I have reviewed the triage vital signs and the nursing notes. I reviewed an old lumbar xray from 2018 and she had facet arthropathy.  Pertinent labs  results that were available during my care of the patient were reviewed by me and considered in my medical decision making (see chart for details).  We were not able to get a good film for lumbar xray due to body habbitus, so I sent her to [facility] to have it done STAT.  In the mean time I placed her on Lidoderm patch and Percocet instead of the Norco.  She denies being on pain contract with Norco prescriber. I believe her BP is high due to her pain. When I call her I will have her recheck her BP after she has tried the meds I prescribed her today.  I've ordered back xray to make sure her pain is not due to Mets from her thyroid cancer.   I called pt at 3:44 pm to check how her pain is and what her BP was after taking the medications I prescribed. Her BP had gone down to 133/71 and pain was slightly better, but if she sneezed or cough, pain would get worse again.  I sent Medrol dose pack for her since her BP is better. I Had reviewed her back xrays 2 hours ago when it was ready, but her BP then was still high.  Final Clinical Impressions(s) / UC Diagnoses   Final diagnoses:  Acute back pain with sciatica, left     Discharge Instructions      I will call you as soon as I see the results of the xray.       ED Prescriptions     Medication Sig  Dispense Auth. Provider   lidocaine (LIDODERM) 5 % Place 1 patch onto the skin daily. Remove & Discard patch within 12 hours or as directed by MD 10 patch Rodriguez-Southworth, Nettie Elm, PA-C   oxyCODONE-acetaminophen (PERCOCET/ROXICET) 5-325 MG tablet Take 1-2 tablets by mouth every 6 (six) hours as needed for severe pain. 10 tablet Rodriguez-Southworth, Nettie Elm, PA-C      I have reviewed the PDMP during this encounter.   Garey Ham, [place] 02/16/23 1545

## 2023-02-19 NOTE — Pre-Procedure Instructions (Signed)
Surgical Instructions    Your procedure is scheduled on February 28, 2023.  Report to [facility] Main Entrance "A" at 11:20 A.M., then check in with the Admitting office.  Call this number if you have problems the morning of surgery:  [phone]  If you have any questions prior to your surgery date call [phone]: Open Monday-Friday 8am-4pm If you experience any cold or flu symptoms such as cough, fever, chills, shortness of breath, etc. between now and your scheduled surgery, please notify us at the above number.     Remember:  Do not eat or drink after midnight the night before your surgery     Take these medicines the morning of surgery with A SIP OF WATER:  desvenlafaxine (PRISTIQ)   levothyroxine (SYNTHROID)   LORazepam (ATIVAN)   oxybutynin (DITROPAN-XL)   pantoprazole (PROTONIX)   rosuvastatin (CRESTOR)   methylPREDNISolone (MEDROL DOSEPAK)    May take these medicines IF NEEDED:  EPINEPHrine   HYDROcodone-acetaminophen (NORCO/VICODIN)   ondansetron (ZOFRAN-ODT)   oxyCODONE-acetaminophen (PERCOCET/ROXICET)   tiZANidine (ZANAFLEX)    As of today, STOP taking any Aspirin (unless otherwise instructed by your surgeon) Aleve, Naproxen, Ibuprofen, Motrin, Advil, Goody's, BC's, all herbal medications, fish oil, and all vitamins.                     Do NOT Smoke (Tobacco/Vaping) for 24 hours prior to your procedure.  If you use a CPAP at night, you may bring your mask/headgear for your overnight stay.   Contacts, glasses, piercing's, hearing aid's, dentures or partials may not be worn into surgery, please bring cases for these belongings.    For patients admitted to the hospital, discharge time will be determined by your treatment team.   Patients discharged the day of surgery will not be allowed to drive home, and someone needs to stay with them for 24 hours.  SURGICAL WAITING ROOM VISITATION Patients having surgery or a procedure may have no more than 2 support people  in the waiting area - these visitors may rotate.   Children under the age of 9 must have an adult with them who is not the patient. If the patient needs to stay at the hospital during part of their recovery, the visitor guidelines for inpatient rooms apply. Pre-op nurse will coordinate an appropriate time for 1 support person to accompany patient in pre-op.  This support person may not rotate.   Please refer to the [facility] website for the visitor guidelines for Inpatients (after your surgery is over and you are in a regular room).    Special instructions:   [facility]- Preparing For Surgery  Before surgery, you can play an important role. Because skin is not sterile, your skin needs to be as free of germs as possible. You can reduce the number of germs on your skin by washing with CHG (chlorahexidine gluconate) Soap before surgery.  CHG is an antiseptic cleaner which kills germs and bonds with the skin to continue killing germs even after washing.    Oral Hygiene is also important to reduce your risk of infection.  Remember - BRUSH YOUR TEETH THE MORNING OF SURGERY WITH YOUR REGULAR TOOTHPASTE  Please do not use if you have an allergy to CHG or antibacterial soaps. If your skin becomes reddened/irritated stop using the CHG.  Do not shave (including legs and underarms) for at least 48 hours prior to first CHG shower. It is OK to shave your face.  Please follow these  instructions carefully.   Shower the NIGHT BEFORE SURGERY and the MORNING OF SURGERY  If you chose to wash your hair, wash your hair first as usual with your normal shampoo.  After you shampoo, rinse your hair and body thoroughly to remove the shampoo.  Use CHG Soap as you would any other liquid soap. You can apply CHG directly to the skin and wash gently with a scrungie or a clean washcloth.   Apply the CHG Soap to your body ONLY FROM THE NECK DOWN.  Do not use on open wounds or open sores. Avoid contact with your eyes,  ears, mouth and genitals (private parts). Wash Face and genitals (private parts)  with your normal soap.   Wash thoroughly, paying special attention to the area where your surgery will be performed.  Thoroughly rinse your body with warm water from the neck down.  DO NOT shower/wash with your normal soap after using and rinsing off the CHG Soap.  Pat yourself dry with a CLEAN TOWEL.  Wear CLEAN PAJAMAS to bed the night before surgery  Place CLEAN SHEETS on your bed the night before your surgery  DO NOT SLEEP WITH PETS.   Day of Surgery: Take a shower with CHG soap. Do not wear jewelry or makeup Do not wear lotions, powders, perfumes/colognes, or deodorant. Do not shave 48 hours prior to surgery.  Men may shave face and neck. Do not bring valuables to the hospital.  [facility] is not responsible for any belongings or valuables. Do not wear nail polish, gel polish, artificial nails, or any other type of covering on natural nails (fingers and toes) If you have artificial nails or gel coating that need to be removed by a nail salon, please have this removed prior to surgery. Artificial nails or gel coating may interfere with anesthesia's ability to adequately monitor your vital signs.  Wear Clean/Comfortable clothing the morning of surgery Remember to brush your teeth WITH YOUR REGULAR TOOTHPASTE.   Please read over the following fact sheets that you were given.    If you received a COVID test during your pre-op visit  it is requested that you wear a mask when out in public, stay away from anyone that may not be feeling well and notify your surgeon if you develop symptoms. If you have been in contact with anyone that has tested positive in the last 10 days please notify you surgeon.

## 2023-02-20 ENCOUNTER — Other Ambulatory Visit: Payer: Self-pay

## 2023-02-20 ENCOUNTER — Encounter (HOSPITAL_COMMUNITY)
Admission: RE | Admit: 2023-02-20 | Discharge: 2023-02-20 | Disposition: A | Discharge status: Home / Self Care | Payer: 59 | Source: Ambulatory Visit | Attending: Otolaryngology | Admitting: Otolaryngology

## 2023-02-20 ENCOUNTER — Encounter (HOSPITAL_COMMUNITY): Payer: Self-pay

## 2023-02-20 VITALS — BP 158/82 | HR 73 | Temp 98.2°F | Resp 20 | Ht 63.0 in | Wt 321.9 lb

## 2023-02-20 DIAGNOSIS — Z87891 Personal history of nicotine dependence: Secondary | ICD-10-CM | POA: Diagnosis not present

## 2023-02-20 DIAGNOSIS — I10 Essential (primary) hypertension: Secondary | ICD-10-CM | POA: Diagnosis not present

## 2023-02-20 DIAGNOSIS — G4733 Obstructive sleep apnea (adult) (pediatric): Secondary | ICD-10-CM | POA: Diagnosis not present

## 2023-02-20 DIAGNOSIS — K769 Liver disease, unspecified: Secondary | ICD-10-CM | POA: Insufficient documentation

## 2023-02-20 DIAGNOSIS — C73 Malignant neoplasm of thyroid gland: Secondary | ICD-10-CM | POA: Diagnosis not present

## 2023-02-20 DIAGNOSIS — E890 Postprocedural hypothyroidism: Secondary | ICD-10-CM | POA: Insufficient documentation

## 2023-02-20 DIAGNOSIS — Z01812 Encounter for preprocedural laboratory examination: Secondary | ICD-10-CM | POA: Diagnosis not present

## 2023-02-20 DIAGNOSIS — R7303 Prediabetes: Secondary | ICD-10-CM | POA: Diagnosis not present

## 2023-02-20 DIAGNOSIS — Z01818 Encounter for other preprocedural examination: Secondary | ICD-10-CM

## 2023-02-20 HISTORY — DX: Unspecified osteoarthritis, unspecified site: M19.90

## 2023-02-20 HISTORY — DX: Hypothyroidism, unspecified: E03.9

## 2023-02-20 LAB — CBC
HCT: 39.5 % (ref 36.0–46.0)
Hemoglobin: 12.7 g/dL (ref 12.0–15.0)
MCH: 30.4 pg (ref 26.0–34.0)
MCHC: 32.2 g/dL (ref 30.0–36.0)
MCV: 94.5 fL (ref 80.0–100.0)
Platelets: 325 10*3/uL (ref 150–400)
RBC: 4.18 MIL/uL (ref 3.87–5.11)
RDW: 13.6 % (ref 11.5–15.5)
WBC: 9.4 10*3/uL (ref 4.0–10.5)
nRBC: 0 % (ref 0.0–0.2)

## 2023-02-20 LAB — COMPREHENSIVE METABOLIC PANEL
ALT: 32 U/L (ref 0–44)
AST: 27 U/L (ref 15–41)
Albumin: 4 g/dL (ref 3.5–5.0)
Alkaline Phosphatase: 62 U/L (ref 38–126)
Anion gap: 8 (ref 5–15)
BUN: 12 mg/dL (ref 6–20)
CO2: 28 mmol/L (ref 22–32)
Calcium: 9.3 mg/dL (ref 8.9–10.3)
Chloride: 103 mmol/L (ref 98–111)
Creatinine, Ser: 0.64 mg/dL (ref 0.44–1.00)
GFR, Estimated: >60 mL/min (ref >60)
Glucose, Bld: 116 mg/dL — ABNORMAL HIGH (ref 70–99)
Potassium: 3.6 mmol/L (ref 3.5–5.1)
Sodium: 139 mmol/L (ref 135–145)
Total Bilirubin: 0.7 mg/dL (ref 0.3–1.2)
Total Protein: 7.1 g/dL (ref 6.5–8.1)

## 2023-02-20 NOTE — Progress Notes (Signed)
PCP - Dr. Kathleene Hazel. [facility] Cardiologist - Saw Dr. Jacinto Halim last in Sept. 2022 and is PRN follow-up  PPM/ICD - Denies Device Orders - n/a Rep Notified - n/a  Chest x-ray - Denies EKG - 06/27/2022 Stress Test - 04/11/2021 ECHO - 03/22/2021 Cardiac Cath - Denies  Sleep Study - +OSA but unable to tolerate CPAP. She is currently looking for a new MD to re-evaluate OSA  Pt is Pre-DM  Last dose of GLP1 agonist- n/a GLP1 instructions: n/a  Blood Thinner Instructions: n/a Aspirin Instructions: n/a  NPO after midnight  COVID TEST- n/a   Anesthesia review: Yes. Recent cardiology visit within the past 2 years. Discussed with Shonna Chock, PA-C  Patient denies shortness of breath, fever, cough and chest pain at PAT appointment. Pt denies any respiratory illness/infection in the last two months.   All instructions explained to the patient, with a verbal understanding of the material. Patient agrees to go over the instructions while at home for a better understanding. Patient also instructed to self quarantine after being tested for COVID-19. The opportunity to ask questions was provided.

## 2023-02-21 NOTE — Anesthesia Preprocedure Evaluation (Addendum)
Anesthesia Evaluation  Patient identified by MRN, date of birth, ID band Patient awake    Reviewed: Allergy & Precautions, H&P , NPO status , Patient's Chart, lab work & pertinent test results  Airway Mallampati: II   Neck ROM: full    Dental   Pulmonary sleep apnea , former smoker   breath sounds clear to auscultation       Cardiovascular hypertension,  Rhythm:regular Rate:Normal     Neuro/Psych   Anxiety Depression     Neuromuscular disease    GI/Hepatic ,GERD  ,,  Endo/Other  Hypothyroidism  Morbid obesity  Renal/GU      Musculoskeletal  (+) Arthritis ,    Abdominal   Peds  Hematology   Anesthesia Other Findings   Reproductive/Obstetrics                             Anesthesia Physical Anesthesia Plan  ASA: 3  Anesthesia Plan: General   Post-op Pain Management:    Induction: Intravenous  PONV Risk Score and Plan: 3 and Ondansetron, Dexamethasone, Midazolam and Treatment may vary due to age or medical condition  Airway Management Planned: Oral ETT  Additional Equipment:   Intra-op Plan:   Post-operative Plan: Extubation in OR  Informed Consent: I have reviewed the patients History and Physical, chart, labs and discussed the procedure including the risks, benefits and alternatives for the proposed anesthesia with the patient or authorized representative who has indicated his/her understanding and acceptance.     Dental advisory given  Plan Discussed with: CRNA, Anesthesiologist and Surgeon  Anesthesia Plan Comments: (PAT note written 02/21/2023 by Shonna Chock, PA-C.  )       Anesthesia Quick Evaluation

## 2023-02-21 NOTE — Progress Notes (Signed)
Anesthesia Chart Review:  Case: [id number] Date/Time: 02/28/23 1312   Procedure: MODIFIED NECK DISSECTION   Anesthesia type: General   Pre-op diagnosis: History of papillary thyroid carcinoma; Thyroid cancer   Location: [facility] ROOM 09 / [facility] OR   Surgeons: Serena Colonel, MD       DISCUSSION: Patient is a 52 year old female scheduled for the above procedure.  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   Last visit with cardiologist Dr. Jacinto Halim was on 06/09/21 for follow-up chest pain and DOE. Work-up included a nuclear stress test, echo, and coronary calcium score. Results overall "low risk". He thought symptoms related to GERD and obesity. No further chest pain. Dyspnea stable. HTN controlled on chlorthalidone. Intolerant to amlodipine due to LE edema. He referred her to the [facility] for weight loss. As needed cardiologist follow-up recommended.  She denied chest pain, shortness of breath, cough, fever, recent respiratory illness at her PAT RN visit.  Anesthesia team to evaluate on the day of surgery.   VS: BP (!) 158/82   Pulse 73   Temp 36.8 C   Resp 20   Ht 5\' 3"  (1.6 m)   Wt (!) 146 kg   LMP 05/22/2015   SpO2 97%   BMI 57.02 kg/m    PROVIDERS: Benita Stabile, MD is PCP Serena Colonel, MD is ENT Darnell Level, MD is general surgeon Yates Decamp, MD is cardiologist. As needed follow-up recommended at 06/09/21 visit. Shamleffer, Brent Bulla, MD is endocrinologist Malachi Bonds, MD is allergist Serena Croissant, MD is HEM-ONC. One year follow-up recommended at  05/02/22 visit.    LABS: Labs reviewed: Acceptable for surgery. A1c 5.6% on 06/27/22. (all labs ordered are listed, but only abnormal results are displayed)  Labs Reviewed  COMPREHENSIVE METABOLIC PANEL - Abnormal; Notable for the following components:      Result Value   Glucose, Bld 116 (*)    All other components within normal limits  CBC    Spirometry 03/09/22: FVC 2.12 (67%). FEV1 1.80 (71%). FEV1/FVC 0.85 (105%). FEF25-75 2.42 (96%). Possible restriction detected.  Severity: Mild.   IMAGES: CT Soft Tissue Neck 11/06/22: IMPRESSION: 1. Status post thyroidectomy without abnormal soft tissue density in the thyroidectomy bed. 2. 1.2 cm x 1.5 cm partially calcified left level IV node suspicious for nodal metastatic disease. A 0.8 cm short axis left level II node is indeterminate. No other pathologic lymphadenopathy in the imaged neck.     EKG: 06/27/22: Sinus Rhythm  Low voltage in precordial leads. ABNORMAL   CV: Exercise Sestamibi stress test 04/11/2021: Exercise nuclear stress test was performed using Bruce protocol. Patient reached 6.2 METS, and 88% of age predicted maximum heart rate. Exercise capacity was low. No chest pain reported. Normal heart rate response. Hypertensive exercise response with peak BP 222/118 mmHg.  Stress EKG revealed no ischemic changes. Decreased tracer uptake in inferior myocardium at rest and stress images likely due to breast attenuation-imaging performed in sitting position. Stress LVEF 74% with no wall motion and thickening abnormality. Low risk study.   CT Cardiac Calcium Scoring 04/01/2021: IMPRESSION: No visible coronary artery calcifications. Total coronary calcium score of 0. Elevated right hemidiaphragm,  stable.  No acute extra cardiac abnormality.   Echocardiogram 03/22/2021: Left ventricle cavity is normal in size and wall thickness. Normal global wall motion. Normal LV systolic function with EF 59%. Normal diastolic filling  pattern. Mild tricuspid regurgitation. Estimated pulmonary artery systolic pressure 26 mmHg.   Past Medical History:  Diagnosis Date   Anxiety    Arthritis    Back pain    Carpal tunnel syndrome    bi lat hands   Chronic leg pain    right   Chronic pain of left knee    Depression    Diverticulitis    Fatty liver    GERD (gastroesophageal reflux disease)    Heart murmur    High cholesterol    History of kidney stones    Hypertension    Hypothyroidism    Joint pain    Pre-diabetes    Sleep apnea     no C- Pap   Swallowing difficulty    Thyroid cancer [facility])     Past Surgical History:  Procedure Laterality Date   ACNE CYST REMOVAL     BREAST BIOPSY Right 12/22/2022   CHOLECYSTECTOMY     COLONOSCOPY     DILATION AND CURETTAGE OF UTERUS  1998   ESOPHAGOGASTRODUODENOSCOPY (EGD) WITH PROPOFOL N/A 06/13/2019   Procedure: ESOPHAGOGASTRODUODENOSCOPY (EGD) WITH PROPOFOL;  Surgeon: Malissa Hippo, MD;  Location: [facility] ENDO SUITE;  Service: Endoscopy;  Laterality: N/A;  7:30   KNEE SURGERY Left    Meniscus Tear   MALONEY DILATION  06/13/2019   Procedure: MALONEY DILATION;  Surgeon: Malissa Hippo, MD;  Location: [facility] END[facility] SUITE;  Service: Endoscopy;;   MASS EXCISION     THORACOTOMY Right 2016   THYROIDECTOMY N/A 07/01/2020   Procedure: TOTAL THYROIDECTOMY WITH Central compartment lymph node disection;  Surgeon: Darnell Level, MD;  Location: [facility] ORS;  Service: General;  Laterality: N/A;    MEDICATIONS:  atenolol (TENORMIN) 50 MG tablet   Cholecalciferol (VITAMIN D3) 50 MCG (2000 UT) TABS   desvenlafaxine (PRISTIQ) 100 MG 24 hr tablet   EPINEPHrine 0.3 mg/0.3 mL IJ SOAJ injection   HYDROcodone-acetaminophen (NORCO/VICODIN) 5-325 MG tablet   levocetirizine (XYZAL) 5 MG tablet   levothyroxine (SYNTHROID) 200 MCG tablet   levothyroxine (SYNTHROID) 50 MCG tablet   lidocaine (LIDODERM) 5 %   LORazepam (ATIVAN) 1 MG tablet   losartan (COZAAR) 100 MG tablet   methylPREDNISolone  (MEDROL DOSEPAK) 4 MG TBPK tablet   Multiple Vitamin (MULTIVITAMIN) tablet   NEURONTIN 300 MG capsule   NON FORMULARY   ondansetron (ZOFRAN-ODT) 4 MG disintegrating tablet   oxybutynin (DITROPAN-XL) 10 MG 24 hr tablet   oxyCODONE-acetaminophen (PERCOCET/ROXICET) 5-325 MG tablet   pantoprazole (PROTONIX) 40 MG tablet   rosuvastatin (CRESTOR) 10 MG tablet   tiZANidine (ZANAFLEX) 4 MG tablet   No current facility-administered medications for this encounter.    Shonna Chock, PA-C Surgical Short Stay/Anesthesiology [facility] Phone [phone] [facility] Phone [phone] 02/21/2023 4:19 PM

## 2023-02-26 ENCOUNTER — Telehealth: Payer: Self-pay

## 2023-02-26 MED ORDER — LEVOCETIRIZINE DIHYDROCHLORIDE 5 MG PO TABS: 5.0000 mg | ORAL_TABLET | Freq: Every day | ORAL | 0 refills | Status: DC

## 2023-02-26 NOTE — Telephone Encounter (Signed)
Patient called and expressed that she needed a refill for her Xyzal. A courtesy refill has been sent to [facility] as requested and a follow up appointment has been scheduled.

## 2023-02-27 NOTE — Progress Notes (Signed)
Patient was called to inform that the surgery time for tomorrow was changed to 12:15 o'clock. Patient was instructed to be at the hospital at 10:15 o'clock. Patient verbalized understanding.

## 2023-02-28 ENCOUNTER — Inpatient Hospital Stay (HOSPITAL_COMMUNITY)
Admission: AD | Admit: 2023-02-28 | Discharge: 2023-03-03 | DRG: 821 | Disposition: A | Discharge status: Home / Self Care | Payer: 59 | Attending: Otolaryngology | Admitting: Otolaryngology

## 2023-02-28 ENCOUNTER — Encounter (HOSPITAL_COMMUNITY): Payer: Self-pay | Admitting: Otolaryngology

## 2023-02-28 ENCOUNTER — Other Ambulatory Visit: Payer: Self-pay

## 2023-02-28 ENCOUNTER — Ambulatory Visit (HOSPITAL_COMMUNITY): Payer: 59 | Admitting: Physician Assistant

## 2023-02-28 ENCOUNTER — Ambulatory Visit (HOSPITAL_COMMUNITY): Payer: 59 | Admitting: Vascular Surgery

## 2023-02-28 ENCOUNTER — Encounter (HOSPITAL_COMMUNITY)
Admission: AD | Disposition: A | Discharge status: Home / Self Care | Payer: Self-pay | Source: Home / Self Care | Attending: Otolaryngology

## 2023-02-28 DIAGNOSIS — E039 Hypothyroidism, unspecified: Secondary | ICD-10-CM

## 2023-02-28 DIAGNOSIS — R110 Nausea: Secondary | ICD-10-CM | POA: Diagnosis not present

## 2023-02-28 DIAGNOSIS — E890 Postprocedural hypothyroidism: Secondary | ICD-10-CM | POA: Diagnosis present

## 2023-02-28 DIAGNOSIS — G4730 Sleep apnea, unspecified: Secondary | ICD-10-CM | POA: Diagnosis present

## 2023-02-28 DIAGNOSIS — Z85850 Personal history of malignant neoplasm of thyroid: Secondary | ICD-10-CM

## 2023-02-28 DIAGNOSIS — I10 Essential (primary) hypertension: Secondary | ICD-10-CM

## 2023-02-28 DIAGNOSIS — Z79899 Other long term (current) drug therapy: Secondary | ICD-10-CM | POA: Diagnosis not present

## 2023-02-28 DIAGNOSIS — Z87891 Personal history of nicotine dependence: Secondary | ICD-10-CM

## 2023-02-28 DIAGNOSIS — H5789 Other specified disorders of eye and adnexa: Secondary | ICD-10-CM | POA: Diagnosis not present

## 2023-02-28 DIAGNOSIS — Z880 Allergy status to penicillin: Secondary | ICD-10-CM | POA: Diagnosis not present

## 2023-02-28 DIAGNOSIS — Z6841 Body Mass Index (BMI) 40.0 and over, adult: Secondary | ICD-10-CM | POA: Diagnosis not present

## 2023-02-28 DIAGNOSIS — K219 Gastro-esophageal reflux disease without esophagitis: Secondary | ICD-10-CM | POA: Diagnosis present

## 2023-02-28 DIAGNOSIS — C770 Secondary and unspecified malignant neoplasm of lymph nodes of head, face and neck: Principal | ICD-10-CM | POA: Diagnosis present

## 2023-02-28 DIAGNOSIS — R9389 Abnormal findings on diagnostic imaging of other specified body structures: Secondary | ICD-10-CM | POA: Diagnosis present

## 2023-02-28 DIAGNOSIS — C73 Malignant neoplasm of thyroid gland: Secondary | ICD-10-CM | POA: Diagnosis not present

## 2023-02-28 DIAGNOSIS — Z79890 Hormone replacement therapy (postmenopausal): Secondary | ICD-10-CM

## 2023-02-28 DIAGNOSIS — R519 Headache, unspecified: Secondary | ICD-10-CM | POA: Diagnosis not present

## 2023-02-28 DIAGNOSIS — E785 Hyperlipidemia, unspecified: Secondary | ICD-10-CM | POA: Diagnosis present

## 2023-02-28 DIAGNOSIS — C801 Malignant (primary) neoplasm, unspecified: Secondary | ICD-10-CM | POA: Diagnosis not present

## 2023-02-28 HISTORY — PX: RADICAL NECK DISSECTION: SHX2284

## 2023-02-28 SURGERY — DISSECTION, NECK, RADICAL
Anesthesia: General | Site: Neck | Laterality: Left

## 2023-02-28 MED ORDER — ATENOLOL 25 MG PO TABS
50.0000 mg | ORAL_TABLET | Freq: Every day | ORAL | Status: DC
  Administered 2023-03-01 – 2023-03-02 (×3): 50 mg via ORAL
  Filled 2023-02-28 (×3): qty 2

## 2023-02-28 MED ORDER — POLYVINYL ALCOHOL 1.4 % OP SOLN: 1.0000 [drp] | OPHTHALMIC | Status: DC | PRN

## 2023-02-28 MED ORDER — ROCURONIUM BROMIDE 10 MG/ML (PF) SYRINGE
PREFILLED_SYRINGE | INTRAVENOUS | Status: AC
  Filled 2023-02-28: qty 10

## 2023-02-28 MED ORDER — ONDANSETRON HCL 4 MG/2ML IJ SOLN
4.0000 mg | Freq: Four times a day (QID) | INTRAMUSCULAR | Status: AC | PRN
  Administered 2023-02-28: 4 mg via INTRAVENOUS

## 2023-02-28 MED ORDER — IBUPROFEN 100 MG/5ML PO SUSP
400.0000 mg | Freq: Four times a day (QID) | ORAL | Status: DC | PRN
  Administered 2023-03-01 – 2023-03-03 (×4): 400 mg via ORAL
  Filled 2023-02-28 (×6): qty 20

## 2023-02-28 MED ORDER — ONDANSETRON HCL 4 MG/2ML IJ SOLN
4.0000 mg | INTRAMUSCULAR | Status: DC | PRN
  Administered 2023-02-28 – 2023-03-01 (×3): 4 mg via INTRAVENOUS
  Filled 2023-02-28 (×3): qty 2

## 2023-02-28 MED ORDER — LORAZEPAM 1 MG PO TABS
1.0000 mg | ORAL_TABLET | Freq: Every day | ORAL | Status: DC
  Administered 2023-02-28 – 2023-03-02 (×3): 1 mg via ORAL
  Filled 2023-02-28 (×3): qty 1

## 2023-02-28 MED ORDER — LEVOTHYROXINE SODIUM 100 MCG PO TABS: 200.0000 ug | ORAL_TABLET | Freq: Every day | ORAL | Status: DC

## 2023-02-28 MED ORDER — ONDANSETRON 4 MG PO TBDP: 4.0000 mg | ORAL_TABLET | Freq: Three times a day (TID) | ORAL | Status: DC | PRN

## 2023-02-28 MED ORDER — CHLORHEXIDINE GLUCONATE 0.12 % MT SOLN
OROMUCOSAL | Status: AC
  Administered 2023-02-28: 15 mL via OROMUCOSAL
  Filled 2023-02-28: qty 15

## 2023-02-28 MED ORDER — ONDANSETRON HCL 4 MG PO TABS: 4.0000 mg | ORAL_TABLET | ORAL | Status: DC | PRN

## 2023-02-28 MED ORDER — PANTOPRAZOLE SODIUM 40 MG PO TBEC
40.0000 mg | DELAYED_RELEASE_TABLET | Freq: Two times a day (BID) | ORAL | Status: DC
  Administered 2023-02-28 – 2023-03-03 (×6): 40 mg via ORAL
  Filled 2023-02-28 (×6): qty 1

## 2023-02-28 MED ORDER — VENLAFAXINE HCL ER 37.5 MG PO CP24
37.5000 mg | ORAL_CAPSULE | Freq: Every day | ORAL | Status: DC
  Filled 2023-02-28: qty 1

## 2023-02-28 MED ORDER — PROPOFOL 10 MG/ML IV BOLUS
INTRAVENOUS | Status: DC | PRN
  Administered 2023-02-28: 200 mg via INTRAVENOUS

## 2023-02-28 MED ORDER — ACETAMINOPHEN 10 MG/ML IV SOLN
INTRAVENOUS | Status: AC
  Filled 2023-02-28: qty 100

## 2023-02-28 MED ORDER — SUCCINYLCHOLINE CHLORIDE 200 MG/10ML IV SOSY
PREFILLED_SYRINGE | INTRAVENOUS | Status: AC
  Filled 2023-02-28: qty 10

## 2023-02-28 MED ORDER — OXYCODONE HCL 5 MG/5ML PO SOLN: 5.0000 mg | Freq: Once | ORAL | Status: DC | PRN

## 2023-02-28 MED ORDER — LIDOCAINE 2% (20 MG/ML) 5 ML SYRINGE
INTRAMUSCULAR | Status: AC
  Filled 2023-02-28: qty 5

## 2023-02-28 MED ORDER — ONDANSETRON HCL 4 MG/2ML IJ SOLN
INTRAMUSCULAR | Status: AC
  Filled 2023-02-28: qty 2

## 2023-02-28 MED ORDER — 0.9 % SODIUM CHLORIDE (POUR BTL) OPTIME
TOPICAL | Status: DC | PRN
  Administered 2023-02-28: 1000 mL

## 2023-02-28 MED ORDER — ACETAMINOPHEN 10 MG/ML IV SOLN
INTRAVENOUS | Status: DC | PRN
  Administered 2023-02-28: 1000 mg via INTRAVENOUS

## 2023-02-28 MED ORDER — BACITRACIN ZINC 500 UNIT/GM EX OINT
TOPICAL_OINTMENT | CUTANEOUS | Status: DC | PRN
  Administered 2023-02-28: 1 via TOPICAL

## 2023-02-28 MED ORDER — MIDAZOLAM HCL 2 MG/2ML IJ SOLN
INTRAMUSCULAR | Status: DC | PRN
  Administered 2023-02-28: 2 mg via INTRAVENOUS

## 2023-02-28 MED ORDER — VITAMIN D 25 MCG (1000 UNIT) PO TABS
2000.0000 [IU] | ORAL_TABLET | Freq: Every day | ORAL | Status: DC
  Administered 2023-02-28 – 2023-03-03 (×4): 2000 [IU] via ORAL
  Filled 2023-02-28 (×7): qty 2

## 2023-02-28 MED ORDER — FENTANYL CITRATE (PF) 250 MCG/5ML IJ SOLN
INTRAMUSCULAR | Status: AC
  Filled 2023-02-28: qty 5

## 2023-02-28 MED ORDER — CHLORHEXIDINE GLUCONATE 0.12 % MT SOLN: 15.0000 mL | Freq: Once | OROMUCOSAL | Status: AC

## 2023-02-28 MED ORDER — FENTANYL CITRATE (PF) 250 MCG/5ML IJ SOLN
INTRAMUSCULAR | Status: DC | PRN
  Administered 2023-02-28 (×2): 50 ug via INTRAVENOUS
  Administered 2023-02-28: 100 ug via INTRAVENOUS
  Administered 2023-02-28 (×2): 50 ug via INTRAVENOUS

## 2023-02-28 MED ORDER — BACITRACIN ZINC 500 UNIT/GM EX OINT
TOPICAL_OINTMENT | CUTANEOUS | Status: AC
  Filled 2023-02-28: qty 28.35

## 2023-02-28 MED ORDER — LEVOTHYROXINE SODIUM 50 MCG PO TABS
250.0000 ug | ORAL_TABLET | Freq: Every day | ORAL | Status: DC
  Administered 2023-03-01 – 2023-03-03 (×3): 250 ug via ORAL
  Filled 2023-02-28 (×3): qty 5

## 2023-02-28 MED ORDER — LACTATED RINGERS IV SOLN: INTRAVENOUS | Status: DC

## 2023-02-28 MED ORDER — OXYCODONE-ACETAMINOPHEN 5-325 MG PO TABS
1.0000 | ORAL_TABLET | Freq: Four times a day (QID) | ORAL | Status: DC | PRN
  Administered 2023-02-28: 2 via ORAL
  Filled 2023-02-28: qty 2

## 2023-02-28 MED ORDER — LOSARTAN POTASSIUM 50 MG PO TABS
100.0000 mg | ORAL_TABLET | Freq: Every day | ORAL | Status: DC
  Administered 2023-03-01 – 2023-03-03 (×3): 100 mg via ORAL
  Filled 2023-02-28 (×3): qty 2

## 2023-02-28 MED ORDER — EPINEPHRINE 0.3 MG/0.3ML IJ SOAJ: 0.3000 mg | INTRAMUSCULAR | Status: DC | PRN

## 2023-02-28 MED ORDER — PHENYLEPHRINE 80 MCG/ML (10ML) SYRINGE FOR IV PUSH (FOR BLOOD PRESSURE SUPPORT)
PREFILLED_SYRINGE | INTRAVENOUS | Status: DC | PRN
  Administered 2023-02-28 (×2): 80 ug via INTRAVENOUS

## 2023-02-28 MED ORDER — LIDOCAINE 2% (20 MG/ML) 5 ML SYRINGE
INTRAMUSCULAR | Status: DC | PRN
  Administered 2023-02-28: 40 mg via INTRAVENOUS

## 2023-02-28 MED ORDER — PHENYLEPHRINE HCL-NACL 20-0.9 MG/250ML-% IV SOLN
INTRAVENOUS | Status: DC | PRN
  Administered 2023-02-28: 25 ug/min via INTRAVENOUS

## 2023-02-28 MED ORDER — SUGAMMADEX SODIUM 200 MG/2ML IV SOLN
INTRAVENOUS | Status: DC | PRN
  Administered 2023-02-28: 300 mg via INTRAVENOUS

## 2023-02-28 MED ORDER — FENTANYL CITRATE (PF) 100 MCG/2ML IJ SOLN
INTRAMUSCULAR | Status: AC
  Filled 2023-02-28: qty 2

## 2023-02-28 MED ORDER — ROSUVASTATIN CALCIUM 5 MG PO TABS
10.0000 mg | ORAL_TABLET | Freq: Every day | ORAL | Status: DC
  Administered 2023-03-01 – 2023-03-03 (×3): 10 mg via ORAL
  Filled 2023-02-28 (×3): qty 2

## 2023-02-28 MED ORDER — ONDANSETRON HCL 4 MG/2ML IJ SOLN
INTRAMUSCULAR | Status: DC | PRN
  Administered 2023-02-28: 4 mg via INTRAVENOUS

## 2023-02-28 MED ORDER — OXYBUTYNIN CHLORIDE ER 10 MG PO TB24
10.0000 mg | ORAL_TABLET | Freq: Two times a day (BID) | ORAL | Status: DC
  Administered 2023-03-01 – 2023-03-03 (×6): 10 mg via ORAL
  Filled 2023-02-28 (×6): qty 1

## 2023-02-28 MED ORDER — FENTANYL CITRATE (PF) 100 MCG/2ML IJ SOLN
25.0000 ug | INTRAMUSCULAR | Status: DC | PRN
  Administered 2023-02-28 (×3): 50 ug via INTRAVENOUS

## 2023-02-28 MED ORDER — OXYCODONE HCL 5 MG PO TABS: 5.0000 mg | ORAL_TABLET | Freq: Once | ORAL | Status: DC | PRN

## 2023-02-28 MED ORDER — DEXAMETHASONE SODIUM PHOSPHATE 10 MG/ML IJ SOLN
INTRAMUSCULAR | Status: DC | PRN
  Administered 2023-02-28: 10 mg via INTRAVENOUS

## 2023-02-28 MED ORDER — MIDAZOLAM HCL 2 MG/2ML IJ SOLN
INTRAMUSCULAR | Status: AC
  Filled 2023-02-28: qty 2

## 2023-02-28 MED ORDER — DEXAMETHASONE SODIUM PHOSPHATE 10 MG/ML IJ SOLN
INTRAMUSCULAR | Status: AC
  Filled 2023-02-28: qty 4

## 2023-02-28 MED ORDER — ATROPINE SULFATE 0.4 MG/ML IV SOLN
INTRAVENOUS | Status: AC
  Filled 2023-02-28: qty 1

## 2023-02-28 MED ORDER — HYDROCODONE-ACETAMINOPHEN 5-325 MG PO TABS
1.0000 | ORAL_TABLET | ORAL | Status: DC | PRN
  Administered 2023-03-01 (×3): 2 via ORAL
  Administered 2023-03-02: 1 via ORAL
  Administered 2023-03-03: 2 via ORAL
  Filled 2023-02-28 (×4): qty 2

## 2023-02-28 MED ORDER — PHENYLEPHRINE 80 MCG/ML (10ML) SYRINGE FOR IV PUSH (FOR BLOOD PRESSURE SUPPORT)
PREFILLED_SYRINGE | INTRAVENOUS | Status: AC
  Filled 2023-02-28: qty 10

## 2023-02-28 MED ORDER — ORAL CARE MOUTH RINSE: 15.0000 mL | Freq: Once | OROMUCOSAL | Status: AC

## 2023-02-28 MED ORDER — BACITRACIN ZINC 500 UNIT/GM EX OINT
TOPICAL_OINTMENT | Freq: Three times a day (TID) | CUTANEOUS | Status: DC
  Filled 2023-02-28: qty 28.35

## 2023-02-28 MED ORDER — DEXTROSE-SODIUM CHLORIDE 5-0.9 % IV SOLN: INTRAVENOUS | Status: DC

## 2023-02-28 MED ORDER — ROCURONIUM BROMIDE 10 MG/ML (PF) SYRINGE
PREFILLED_SYRINGE | INTRAVENOUS | Status: DC | PRN
  Administered 2023-02-28: 60 mg via INTRAVENOUS
  Administered 2023-02-28: 20 mg via INTRAVENOUS

## 2023-02-28 MED ORDER — TIZANIDINE HCL 4 MG PO TABS
4.0000 mg | ORAL_TABLET | Freq: Three times a day (TID) | ORAL | Status: DC | PRN
  Filled 2023-02-28: qty 1

## 2023-02-28 MED ORDER — ONDANSETRON HCL 4 MG/2ML IJ SOLN
INTRAMUSCULAR | Status: AC
  Filled 2023-02-28: qty 8

## 2023-02-28 MED ORDER — GABAPENTIN 300 MG PO CAPS
300.0000 mg | ORAL_CAPSULE | Freq: Every day | ORAL | Status: DC
  Administered 2023-02-28 – 2023-03-02 (×3): 300 mg via ORAL
  Filled 2023-02-28 (×3): qty 1

## 2023-02-28 MED ORDER — LORATADINE 10 MG PO TABS
5.0000 mg | ORAL_TABLET | Freq: Every day | ORAL | Status: DC
  Administered 2023-02-28 – 2023-03-03 (×4): 5 mg via ORAL
  Filled 2023-02-28 (×4): qty 1

## 2023-02-28 MED ORDER — ADULT MULTIVITAMIN W/MINERALS CH
2.0000 | ORAL_TABLET | Freq: Every day | ORAL | Status: DC
  Administered 2023-03-01 – 2023-03-03 (×3): 2 via ORAL
  Filled 2023-02-28 (×3): qty 2

## 2023-02-28 MED ORDER — HYDROCODONE-ACETAMINOPHEN 5-325 MG PO TABS
1.0000 | ORAL_TABLET | Freq: Four times a day (QID) | ORAL | Status: DC | PRN
  Administered 2023-03-02: 1 via ORAL
  Filled 2023-02-28 (×2): qty 1

## 2023-02-28 SURGICAL SUPPLY — 51 items
APPLIER CLIP 9.375 SM OPEN (CLIP)
APR CLP SM 9.3 20 MLT OPN (CLIP)
BAG COUNTER SPONGE SURGICOUNT (BAG) ×1 IMPLANT
BAG SPNG CNTER NS LX DISP (BAG) ×1
CANISTER SUCT 3000ML PPV (MISCELLANEOUS) ×1 IMPLANT
CLEANER TIP ELECTROSURG 2X2 (MISCELLANEOUS) ×1 IMPLANT
CLIP APPLIE 9.375 SM OPEN (CLIP) IMPLANT
CNTNR URN SCR LID CUP LEK RST (MISCELLANEOUS) IMPLANT
CONT SPEC 4OZ STRL OR WHT (MISCELLANEOUS)
CORD BIPOLAR FORCEPS 12FT (ELECTRODE) ×1 IMPLANT
COVER SURGICAL LIGHT HANDLE (MISCELLANEOUS) ×1 IMPLANT
DRAIN CHANNEL 15F RND FF W/TCR (WOUND CARE) IMPLANT
DRAIN JP 10F RND RADIO (DRAIN) IMPLANT
DRAIN JP 15F RND RADIO PRF (DRAIN) IMPLANT
DRAPE HALF SHEET 40X57 (DRAPES) IMPLANT
DRAPE INCISE 23X17 STRL (DRAPES) IMPLANT
DRAPE INCISE IOBAN 23X17 STRL (DRAPES) IMPLANT
ELECT COATED BLADE 2.86 ST (ELECTRODE) ×1 IMPLANT
ELECT REM PT RETURN 9FT ADLT (ELECTROSURGICAL) ×1
ELECTRODE REM PT RTRN 9FT ADLT (ELECTROSURGICAL) ×1 IMPLANT
EVACUATOR SILICONE 100CC (DRAIN) ×1 IMPLANT
FORCEPS BIPOLAR SPETZLER 8 1.0 (NEUROSURGERY SUPPLIES) ×1 IMPLANT
GAUZE 4X4 16PLY ~~LOC~~+RFID DBL (SPONGE) ×1 IMPLANT
GLOVE BIO SURGEON STRL SZ 6.5 (GLOVE) ×1 IMPLANT
GLOVE ECLIPSE 7.5 STRL STRAW (GLOVE) ×1 IMPLANT
GOWN STRL REUS W/ TWL LRG LVL3 (GOWN DISPOSABLE) ×2 IMPLANT
GOWN STRL REUS W/TWL LRG LVL3 (GOWN DISPOSABLE) ×3
KIT BASIN OR (CUSTOM PROCEDURE TRAY) ×1 IMPLANT
KIT TURNOVER KIT B (KITS) ×1 IMPLANT
LOCATOR NERVE 3 VOLT (DISPOSABLE) IMPLANT
NDL PRECISIONGLIDE 27X1.5 (NEEDLE) ×1 IMPLANT
NEEDLE PRECISIONGLIDE 27X1.5 (NEEDLE) IMPLANT
NS IRRIG 1000ML POUR BTL (IV SOLUTION) ×1 IMPLANT
PAD ARMBOARD 7.5X6 YLW CONV (MISCELLANEOUS) ×2 IMPLANT
PENCIL FOOT CONTROL (ELECTRODE) ×1 IMPLANT
SHEARS HARMONIC 9CM CVD (BLADE) ×1 IMPLANT
SPECIMEN JAR MEDIUM (MISCELLANEOUS) IMPLANT
SPONGE INTESTINAL PEANUT (DISPOSABLE) IMPLANT
SPONGE T-LAP 18X18 ~~LOC~~+RFID (SPONGE) IMPLANT
STAPLER VISISTAT 35W (STAPLE) ×1 IMPLANT
SUT CHROMIC 3 0 SH 27 (SUTURE) ×1 IMPLANT
SUT CHROMIC 5 0 P 3 (SUTURE) IMPLANT
SUT ETHILON 3 0 PS 1 (SUTURE) ×1 IMPLANT
SUT ETHILON 5 0 PS 2 18 (SUTURE) IMPLANT
SUT SILK 2 0 REEL (SUTURE) IMPLANT
SUT SILK 3 0 SH CR/8 (SUTURE) ×1 IMPLANT
SUT SILK 4 0 REEL (SUTURE) ×1 IMPLANT
SUT VIC AB 3-0 SH 18 (SUTURE) IMPLANT
TRAY ENT MC OR (CUSTOM PROCEDURE TRAY) ×1 IMPLANT
TRAY FOLEY MTR SLVR 14FR STAT (SET/KITS/TRAYS/PACK) IMPLANT
TUBE 10FR 43IN 5G WT TIP ENFIT (TUBING) IMPLANT

## 2023-02-28 NOTE — Transfer of Care (Signed)
Immediate Anesthesia Transfer of Care Note  Patient: Christine Mason  Procedure(s) Performed: MODIFIED NECK DISSECTION (Left)  Patient Location: PACU  Anesthesia Type:General  Level of Consciousness: awake, alert , and oriented  Airway & Oxygen Therapy: Patient Spontanous Breathing and Patient connected to face mask oxygen  Post-op Assessment: Report given to RN and Post -op Vital signs reviewed and stable  Post vital signs: Reviewed and stable  Last Vitals:  Vitals Value Taken Time  BP 141/57 02/28/23 1450  Temp    Pulse 101 02/28/23 1454  Resp 16 02/28/23 1454  SpO2 97 % 02/28/23 1454  Vitals shown include unvalidated device data.  Last Pain:  Vitals:   02/28/23 1109  PainSc: 0-No pain         Complications: No notable events documented.

## 2023-02-28 NOTE — Op Note (Signed)
OPERATIVE REPORT  DATE OF SURGERY: 02/28/2023  PATIENT:  Christine Mason,  52 y.o. female  PRE-OPERATIVE DIAGNOSIS:  History of papillary thyroid carcinoma; Thyroid cancer  POST-OPERATIVE DIAGNOSIS:  History of papillary thyroid carcinoma; Thyroid cancer  PROCEDURE:  Procedure(s): MODIFIED NECK DISSECTION, left, selective, levels 2 3 and 4  SURGEON:  Susy Frizzle, MD  ASSISTANTS: RNFA  ANESTHESIA:   General   EBL: 50 ml  DRAINS: 15 French round JP  LOCAL MEDICATIONS USED:  None  SPECIMEN: Left selective neck dissection, levels 2, 3 and 4  COUNTS:  Correct  PROCEDURE DETAILS: The patient was taken to the operating room and placed on the operating table in the supine position. Following induction of general endotracheal anesthesia, neck was prepped and draped in a standard fashion.  A shoulder roll was used for positioning.  A left Hemi apron incision was outlined with a marking pen starting at the left portion of the thyroidectomy scar.  Electrocautery was used to incise the skin and subcutaneous tissue and through the platysma layer.  Subplatysmal flaps were developed superiorly and inferiorly.  Stay sutures were used for retraction.  The external jugular vein was ligated in the upper and lower area and divided between clamps.  Fibrofatty tissue lateral to the sternocleidomastoid muscle was dissected anteriorly off the muscle.  Dissection then continued medial to the muscle.  The spinal accessory nerve was identified and dissected free.  The fibrofatty tissue and the region posterosuperior to the spinal accessory nerve was then dissected down to the splenius capitis and levator scapular muscles.  This was then brought under the nerve.  The nerve was preserved.  Dissection continued down through the remainder of the level 2 and 3 bring the soft tissue forward off of the cutaneous branches of the cervical plexus.  The internal jugular vein was identified.  The carotid and vagus were  also identified and the fibrofatty lymph node bearing tissue was dissected off of the great vessels.  More inferiorly the omohyoid muscle was divided and dissection continued.  There was a level 2 node that was approximately 2 x 1 cm.  There was a larger level 4 node just lateral to the internal jugular vein that was dark in color and approximately 3 cm in greatest dimension.  At the lower limit of dissection the suspected lymphatics were ligated between clamps and divided.  4 silk ties were used throughout the case.  Specimen was brought forward and dissected off of the anterior aspect of the internal jugular vein.  The lower anterior limit of dissection was the fibrotic scar tissue around the thyroidectomy bed.  More superior to the submandibular gland was preserved.  No other lymph nodes were palpable throughout the neck.    Specimen was removed oriented on a towel and sent for pathologic evaluation.  The wound was irrigated with saline.  There is no signs of chyle.  Drain was exited through a separate stab incision and secured in place with silk suture.  Interrupted 3-0 chromic was used on the platysma layer and staples were used on the skin.  Bacitracin was applied to the surface.  Drain was charged.  Patient was awakened extubated transferred to recovery in stable condition.    PATIENT DISPOSITION:  To PACU, stable

## 2023-02-28 NOTE — Progress Notes (Signed)
ENT Post-Op note  Post operative day zero status post left neck dissection 2-4 for papillary thyroid carcinoma. Patient is doing well. No significant pain. No breathing or swallowing impairment. Has some complaining of right eye irritation, and itching. Does take allergy meds at baseline. Would be interested in eyedrop. Mild nausea. Family at bedside.   On exam the patient is resting comfortably. No acute distress. Neck flap s down. Staple line intact. JP drain holding bulbs suction with sanguinous output.   A/P: postoperative day zero after left neck dissection. Doing well. Will order artificial tears. Patient will take antihistamine for allergy symptoms. She will take Zofran this evening for nausea.  Scarlette Ar MD

## 2023-02-28 NOTE — Interval H&P Note (Signed)
History and Physical Interval Note:  02/28/2023 10:42 AM  Christine Mason  has presented today for surgery, with the diagnosis of History of papillary thyroid carcinoma; Thyroid cancer.  The various methods of treatment have been discussed with the patient and family. After consideration of risks, benefits and other options for treatment, the patient has consented to  Procedure(s): MODIFIED NECK DISSECTION (N/A) as a surgical intervention.  The patient's history has been reviewed, patient examined, no change in status, stable for surgery.  I have reviewed the patient's chart and labs.  Questions were answered to the patient's satisfaction.     Serena Colonel

## 2023-02-28 NOTE — Anesthesia Procedure Notes (Signed)
Procedure Name: Intubation Date/Time: 02/28/2023 12:54 PM  Performed by: Garfield Cornea, CRNAPre-anesthesia Checklist: Patient identified, Emergency Drugs available, Suction available and Patient being monitored Patient Re-evaluated:Patient Re-evaluated prior to induction Oxygen Delivery Method: Circle System Utilized Preoxygenation: Pre-oxygenation with 100% oxygen Induction Type: IV induction Ventilation: Mask ventilation without difficulty Laryngoscope Size: Mac and 3 Grade View: Grade I Tube type: Oral Tube size: 7.0 mm Number of attempts: 1 Airway Equipment and Method: Stylet Placement Confirmation: ETT inserted through vocal cords under direct vision, positive ETCO2 and breath sounds checked- equal and bilateral Secured at: 22 cm Tube secured with: Tape Dental Injury: Teeth and Oropharynx as per pre-operative assessment

## 2023-03-01 ENCOUNTER — Encounter (HOSPITAL_COMMUNITY): Payer: Self-pay | Admitting: Otolaryngology

## 2023-03-01 MED ORDER — ONDANSETRON 4 MG PO TBDP
8.0000 mg | ORAL_TABLET | Freq: Three times a day (TID) | ORAL | Status: DC | PRN
  Administered 2023-03-01 – 2023-03-03 (×5): 8 mg via ORAL
  Filled 2023-03-01 (×6): qty 2

## 2023-03-01 MED ORDER — VENLAFAXINE HCL ER 75 MG PO CP24
75.0000 mg | ORAL_CAPSULE | Freq: Every day | ORAL | Status: DC
  Administered 2023-03-01 – 2023-03-03 (×3): 75 mg via ORAL
  Filled 2023-03-01 (×3): qty 1

## 2023-03-01 NOTE — Anesthesia Postprocedure Evaluation (Signed)
Anesthesia Post Note  Patient: Christine Mason  Procedure(s) Performed: MODIFIED NECK DISSECTION (Left: Neck)     Patient location during evaluation: PACU Anesthesia Type: General Level of consciousness: awake and alert Pain management: pain level controlled Vital Signs Assessment: post-procedure vital signs reviewed and stable Respiratory status: spontaneous breathing, nonlabored ventilation, respiratory function stable and patient connected to nasal cannula oxygen Cardiovascular status: blood pressure returned to baseline and stable Postop Assessment: no apparent nausea or vomiting Anesthetic complications: no   No notable events documented.  Last Vitals:  Vitals:   03/01/23 0002 03/01/23 0629  BP: (!) 146/97 (!) 158/85  Pulse: (!) 104 81  Resp: 18 17  Temp: 36.8 C 36.8 C  SpO2: 92% 97%    Last Pain:  Vitals:   03/01/23 0629  TempSrc: Oral  PainSc:                  [name]

## 2023-03-01 NOTE — Progress Notes (Signed)
Subjective: Had some headache overnight otherwise doing well.  Also had some nausea.  Seems to be getting better.  Ate some breakfast this morning.  Objective: Vital signs in last 24 hours: Temp:  [97 F (36.1 C)-98.9 F (37.2 C)] 98.3 F (36.8 C) (06/13 0629) Pulse Rate:  [72-104] 81 (06/13 0629) Resp:  [14-22] 17 (06/13 0629) BP: (119-165)/(57-107) 158/85 (06/13 0629) SpO2:  [90 %-97 %] 97 % (06/13 0629) Weight:  [146 kg] 146 kg (06/12 1028) Weight change:     Intake/Output from previous day: 06/12 0701 - 06/13 0700 In: 1742.1 [P.O.:241; I.V.:1501.1] Out: 110 [Drains:60; Blood:50] Intake/Output this shift: No intake/output data recorded.  PHYSICAL EXAM: Awake and alert.  Sitting in a chair.  Breathing and voice are normal.  Incision looks excellent.  Drain is holding a seal and with serosanguineous material.  Lab Results: No results for input(s): "WBC", "HGB", "HCT", "PLT" in the last 72 hours. BMET No results for input(s): "NA", "K", "CL", "CO2", "GLUCOSE", "BUN", "CREATININE", "CALCIUM" in the last 72 hours.  Studies/Results: No results found.  Medications: I have reviewed the patient's current medications.  Assessment/Plan: Stable postop.  Continue with drain until appropriate for removal.  She would like to stay in the hospital until the drain comes out.  Start ambulating today.  Hep-Lock IV.  Will increase the Zofran dose.  LOS: 1 day      Serena Colonel 03/01/2023, 8:40 AM

## 2023-03-01 NOTE — Plan of Care (Signed)
  Problem: Activity: Goal: Ability to tolerate increased activity will improve Outcome: Progressing   Problem: Nutrition: Goal: Maintenance of adequate nutrition will improve Outcome: Progressing   Problem: Education: Goal: Knowledge of General Education information will improve Description: Including pain rating scale, medication(s)/side effects and non-pharmacologic comfort measures Outcome: Progressing   Problem: Activity: Goal: Risk for activity intolerance will decrease Outcome: Progressing   Problem: Elimination: Goal: Will not experience complications related to bowel motility Outcome: Progressing Goal: Will not experience complications related to urinary retention Outcome: Progressing   Problem: Pain Managment: Goal: General experience of comfort will improve Outcome: Progressing

## 2023-03-01 NOTE — TOC Initial Note (Signed)
Transition of Care (TOC) - Initial/Assessment Note   Spoke to patient at bedside.   Confirmed face sheet information.   PCP is Nita Sells  Patient from home with husband. Patient did not use any DME prior to admission.   Patient is a NT at [facility]   She would like to stay in hospital until drain is removed.   If patient discharged prior to drain removal, nursing staff at hospital will provide drain education.   Patient aware and voiced understanding  Patient Details  Name: Christine Mason MRN: [id number] Date of Birth: [birth year]  Transition of Care [facility]) CM/SW Contact:    Kingsley Plan, RN Phone Number: 03/01/2023, 10:17 AM  Clinical Narrative:                   Expected Discharge Plan: Home/Self Care Barriers to Discharge: Continued Medical Work up   Patient Goals and CMS Choice Patient states their goals for this hospitalization and ongoing recovery are:: to return to home          Expected Discharge Plan and Services   Discharge Planning Services: CM Consult   Living arrangements for the past 2 months: Single Family Home                 DME Arranged: N/A         HH Arranged: NA          Prior Living Arrangements/Services Living arrangements for the past 2 months: Single Family Home Lives with:: Spouse Patient language and need for interpreter reviewed:: Yes Do you feel safe going back to the place where you live?: Yes      Need for Family Participation in Patient Care: Yes (Comment) Care giver support system in place?: Yes (comment)   Criminal Activity/Legal Involvement Pertinent to Current Situation/Hospitalization: No - Comment as needed  Activities of Daily Living      Permission Sought/Granted   Permission granted to share information with : No              Emotional Assessment Appearance:: Appears stated age Attitude/Demeanor/Rapport: Engaged Affect (typically observed): Accepting Orientation: : Oriented to  Self, Oriented to Place, Oriented to  Time, Oriented to Situation Alcohol / Substance Use: Not Applicable Psych Involvement: No (comment)  Admission diagnosis:  Thyroid cancer [facility]) [C73] Patient Active Problem List   Diagnosis Date Noted   Thyroid cancer (HCC) 02/28/2023   Multiple thyroid nodules 08/24/2022   Hepatic vein stenosis 08/24/2022   Depression 07/11/2022   Other fatigue 06/27/2022   SOBOE (shortness of breath on exertion) 06/27/2022   Osteoarthritis of both knees 06/27/2022   Pre-diabetes 06/27/2022   Other hyperlipidemia 06/27/2022   Depression screening 06/27/2022   Seasonal and perennial allergic rhinitis 03/08/2022   Wheeze 03/08/2022   Gastroesophageal reflux disease 03/08/2022   Hx of papillary thyroid carcinoma 04/14/2021   Status post total thyroidectomy 04/14/2021   Unilateral primary osteoarthritis, left knee 03/24/2021   Chronic pain of left knee 03/24/2021   Morbid obesity (HCC) 02/09/2021   Abnormal liver function tests 02/09/2021   Chest pain 02/09/2021   Chronic low back pain 02/09/2021   Generalized hyperhidrosis 02/09/2021   Major depressive disorder 02/09/2021   Palpitations 02/09/2021   Urinary incontinence 02/09/2021   Impaired fasting glucose 02/01/2021   Essential hypertension 02/01/2021   Mixed hyperlipidemia 02/01/2021   Vitamin D deficiency 02/01/2021   Hypothyroidism 01/12/2021   Hypokalemia 01/12/2021   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  Neoplasm of uncertain behavior of thyroid gland 06/29/2020   Oropharyngeal dysphagia 05/06/2019   Dysphagia, pharyngoesophageal phase 05/05/2019   Vasomotor symptoms due to menopause 03/13/2019   Genetic testing 06/03/2015   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 [place] [address] Phone: [phone] Fax: [phone]  [facility] - [facility] [address] [place] [address] Phone: [phone] Fax: [phone]  [facility]) [place], [place] - [address] [place] [address] Phone: [phone] Fax: [phone]  [id number] - [place], [place] - [address] [facility] [address] [place] [address] Phone: [phone] Fax: [phone]     Social Determinants of Health (SDOH) Social History: SDOH Screenings   Depression (PHQ2-9): High Risk (06/27/2022)  Tobacco Use: Medium Risk (03/01/2023)   SDOH Interventions:     Readmission Risk Interventions     No data to display

## 2023-03-01 NOTE — Progress Notes (Signed)
RN called into pt room due to complain of headache, pain on the site, and nausea. PRN medication were given. After few minutes RN came back to re assess the pt. Noted pt sitting on chair. Pt stated that she is more better and comfortable sitting on a chair than lying on bed that makes her nauseous. No more complaints. Will continue to monitor.

## 2023-03-02 LAB — SURGICAL PATHOLOGY

## 2023-03-02 MED ORDER — LEVOCETIRIZINE DIHYDROCHLORIDE 5 MG PO TABS: 5.0000 mg | ORAL_TABLET | Freq: Every day | ORAL | 0 refills | Status: DC

## 2023-03-02 NOTE — Progress Notes (Signed)
Patient requesting IV to be removed. MD Christine Mason OK to remove and keep out.

## 2023-03-02 NOTE — Progress Notes (Signed)
Subjective: Doing well, nausea much better.  Objective: Vital signs in last 24 hours: Temp:  [97.4 F (36.3 C)-98.5 F (36.9 C)] 97.9 F (36.6 C) (06/14 0458) Pulse Rate:  [74-80] 76 (06/14 0458) Resp:  [17-18] 17 (06/14 0458) BP: (144-155)/(86-99) 153/91 (06/14 0458) SpO2:  [94 %-97 %] 94 % (06/14 0458) Weight change:     Intake/Output from previous day: 06/13 0701 - 06/14 0700 In: 120 [P.O.:120] Out: 50 [Drains:50] Intake/Output this shift: No intake/output data recorded.  PHYSICAL EXAM: Surgical site looks excellent.  She is awake and alert.  Drain is functioning.  Lab Results: No results for input(s): "WBC", "HGB", "HCT", "PLT" in the last 72 hours. BMET No results for input(s): "NA", "K", "CL", "CO2", "GLUCOSE", "BUN", "CREATININE", "CALCIUM" in the last 72 hours.  Studies/Results: No results found.  Medications: I have reviewed the patient's current medications.  Assessment/Plan: Stable postop.  Continue to monitor drainage output and will remove drain and discharge home when appropriate.  LOS: 2 days      Christine Mason 03/02/2023, 7:37 AM

## 2023-03-02 NOTE — Telephone Encounter (Signed)
Patient called requesting a pharmacy change to [facility].

## 2023-03-02 NOTE — Addendum Note (Signed)
Addended by: Berna Bue on: 03/02/2023 11:28 AM   Modules accepted: Orders

## 2023-03-02 NOTE — Telephone Encounter (Signed)
Sent in refill of xyzal to [facility]

## 2023-03-02 NOTE — Telephone Encounter (Signed)
Left a message informing patient that Xyzal has been sent to the requested pharmacy.

## 2023-03-03 ENCOUNTER — Other Ambulatory Visit (HOSPITAL_COMMUNITY): Payer: Self-pay

## 2023-03-03 MED ORDER — BACITRACIN ZINC 500 UNIT/GM EX OINT
TOPICAL_OINTMENT | Freq: Three times a day (TID) | CUTANEOUS | 0 refills | Status: DC
  Filled 2023-03-03: qty 28, 7d supply, fill #0

## 2023-03-03 MED ORDER — HYDROCODONE-ACETAMINOPHEN 5-325 MG PO TABS
1.0000 | ORAL_TABLET | Freq: Four times a day (QID) | ORAL | 0 refills | Status: AC | PRN
  Filled 2023-03-03 (×2): qty 10, 3d supply, fill #0

## 2023-03-03 MED ORDER — ONDANSETRON HCL 4 MG PO TABS
4.0000 mg | ORAL_TABLET | Freq: Every day | ORAL | 0 refills | Status: AC | PRN
  Filled 2023-03-03 (×2): qty 5, 5d supply, fill #0

## 2023-03-03 MED ORDER — LEVOCETIRIZINE DIHYDROCHLORIDE 5 MG PO TABS
5.0000 mg | ORAL_TABLET | Freq: Every day | ORAL | 0 refills | Status: AC
  Filled 2023-03-03 (×2): qty 30, 30d supply, fill #0

## 2023-03-03 NOTE — Discharge Instructions (Signed)
Neck Mass Excision Post Operative Instructions Office number [phone]  The Surgery Itself Neck mass removal involves general anesthesia, typically for 1-2 hours. Patients may be quite sedated for several hours after surgery and may remain sleepy for much of the day. Nausea and vomiting are occasionally seen, and usually resolve by the evening of surgery - even without additional medications. Most patients go home the same day as surgery.  Your Incision Your incision is closed with absorbable sutures and will have a tape bandage or skin glue over the incision. You can shower and wash your hair as usual the day after surgery. You may wash in a bathtub prior to that time if you are careful not to get your neck wet. Do not soak or scrub the incision. You might notice bruising around your incision and slight swelling above the scar when you are upright. You may have numbness of the skin around the incision. In addition, the scar may become pink and hard. This hardening will peak at about 3 weeks and may result in some tightness, which will disappear over the next 2 to 3 months. You should apply sunscreen on your incision site starting 1 month after surgery EVERY day for the first year after surgery. This will prevent a red or pink scar and give you the best cosmetic result for your scar. A daily moisturizer with sunscreen (example Oil of Olay with SPF 15) is fine.  Limitations You can start resuming normal activities as tolerated 7 days after surgery. For some patients, lifting can cause pain and stretching at the surgery site for up to 3 weeks after surgery. You should not drive or drink alcohol while taking pain medications. Most people can return to work/school 1 week after surgery, but there may be physical limitations as far as what you may do while at work. Your surgeon will review your specific limitations and release you when you are ready to return to work.  Medications ?  Pain medication can be used for pain as prescribed. Pain is expected after surgery. Your neck will be sore and pain will be worse when the neck is stretched. As the surgical site heals, pain will resolve over the course of a week. Pain medications can cause nausea, which can be prevented if you take them with food or milk. ? Bacitracin ointment can be applied to the incision once the tape is removed or the glue peels off. This prevents scabbing and itching. ? Take all of your routine medications as prescribed, unless told otherwise by your surgeon. Any medications that thin the blood should be discussed with your surgeon. ? IT IS OK TO TAKE OVER THE COUNTER PAIN MEDICATION (IBUPROFEN, NAPROXEN, or ACETAMINOPHEN) IN ADDITION TO YOUR PRESCRIBED MEDICATIONS. DO NOT TAKE ASPIRIN UNLESS CLEARED WITH YOUR SURGEON. ? Limit Acetaminophen/Tylenol to less than 4,000mg/day ? Limit Ibuprofen/Motrin to less than 3,600mg/day  Pain The main complaint following neck surgery is pain with swallowing and neck movement. Some people experience a dull ache, while others feel a sharp pain. This should not keep you from eating anything you want and will improve daily after surgery.  Reasons to call your surgeon's office ? Persistent fever over 101 F ? Bleeding from the neck incision ? Increasing neck swelling ? Pain that is not relieved by your medications ? Purulent drainage (pus) from the incision ? Redness surrounding the incision that is worsening or getting bigger ? Bleeding is possible after surgery and the most serious cases may cause   trouble breathing. Symptoms include rapid swelling in the neck, trouble breathing, red and purple discoloration of the skin over the incision. Please call doctor immediately or if trouble breathing is present, go to the closest emergency room or call 911. 

## 2023-03-03 NOTE — Progress Notes (Signed)
OTOLARYNGOLOGY - HEAD AND NECK SURGERY FACIAL PLASTIC & RECONSTRUCTIVE SURGERY PROGRESS NOTE  ID: 52 y/o F with hx of PTC POD#3 s/p left neck dissection (02/28/23 - Pollyann Kennedy)  Subjective: Remains admitted for drain care. JP Drain output overnight 150 mL No acute events Patient eager to go home today with drain Questions about path and outpatient surveillance  Objective: Vital signs in last 24 hours: Temp:  [97.4 F (36.3 C)-99.2 F (37.3 C)] 97.4 F (36.3 C) (06/15 0726) Pulse Rate:  [66-81] 70 (06/15 0726) Resp:  [16-19] 16 (06/15 0726) BP: (140-158)/(83-102) 156/83 (06/15 0726) SpO2:  [92 %-99 %] 99 % (06/15 0726)  Physical exam: Resting in chair in NAD. Neck flaps down. JP drain with s/s output.   @LABLAST2 (wbc:2,hgb:2,hct:2,plt:2) No results for input(s): "NA", "K", "CL", "CO2", "GLUCOSE", "BUN", "CREATININE", "CALCIUM" in the last 72 hours.   Assessment/Plan: POD#3 s/p left neck dissection for PTC. Doing well  Plan to DC home with drain and follow up outpatient next week with Dr. Pollyann Kennedy for removal    LOS: 3 days   Scarlette Ar 03/03/2023, 7:38 AM  I have personally spent 15 minutes involved in face-to-face and non-face-to-face activities for this patient on the day of the visit.  Professional time spent includes the following activities, in addition to those noted in the documentation: preparing to see the patient (eg, review of tests), obtaining and/or reviewing separately obtained history, performing a medically appropriate examination and/or evaluation, counseling and educating the patient/family/caregiver, ordering medications, tests or procedures, referring and communicating with other healthcare professionals, documenting clinical information in the electronic or other health record, independently interpreting results and communicating results with the patient/family/caregiver, care coordination.  Electronically signed by:  Scarlette Ar, MD  Staff  Physician Facial Plastic & Reconstructive Surgery Otolaryngology - Head and Neck Surgery [facility] [place]

## 2023-03-03 NOTE — Progress Notes (Signed)
Patient verbalized understanding of instructions. Also demonstrated jp drain care. Supplies provided. All belongings and paperwork given to patient.

## 2023-03-03 NOTE — Discharge Summary (Signed)
Physician Discharge Summary  Patient ID: Christine Mason MRN: [id number] DOB/AGE: 53/04/1971 53 y.o.  Admit date: 02/28/2023 Discharge date: 03/03/2023  Admission Diagnoses:  Principal Problem:   Thyroid cancer [facility])   Discharge Diagnoses:  Same  Surgeries: Procedure(s): MODIFIED NECK DISSECTION on 02/28/2023  Discharged Condition: Improved  Hospital Course: Christine Mason is an 52 y.o. female who was admitted 02/28/2023 with a chief complaint of No chief complaint on file. , and found to have a diagnosis of Thyroid cancer (HCC).  They were brought to the operating room on 02/28/2023 and underwent the above named procedures.    Physical Exam:  General: Awake and alert, no acute distress Neck: flaps down. Staple line intact. JP drain with serosanguinous output. Recent vital signs:  Vitals:   03/03/23 0506 03/03/23 0726  BP: (!) 140/87 (!) 156/83  Pulse: 66 70  Resp: 18 16  Temp: 98 F (36.7 C) (!) 97.4 F (36.3 C)  SpO2: 95% 99%    Recent laboratory studies:  Results for orders placed or performed during the hospital encounter of 02/28/23  Surgical pathology  Result Value Ref Range   SURGICAL PATHOLOGY      SURGICAL PATHOLOGY CASE: [id number] PATIENT: Christine Mason Surgical Pathology Report     Clinical History: history of papillary thyroid carcinoma, thyroid cancer (cm)     FINAL MICROSCOPIC DIAGNOSIS:  A. 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      Metastatic focus: 12 mm in maximal dimension.      No extranodal extension identified.   GROSS DESCRIPTION:  Received fresh is a portion of soft tissue clinical identified as left selective neck dissection levels 2, 3 and 4 measuring 15.5 x 4 x 2 cm. The specimen is oriented with an attached diagram.  There are 28 rubbery ovoid nodules grossly consistent with lymph nodes  measuring from 0.3 to 1.8 cm.  The lymph nodes are entirely submitted in 14 cassettes. 1 = 5 whole level 2 lymph nodes 2 = 1 sectioned level 2 lymph node 3 = 7 whole level 3 lymph nodes 4-7 = 1 sectioned level 3 l ymph node each 8 = 6 whole level 4 lymph nodes 9-12 = 1 sectioned level 4 lymph node each 13, 14 = 1 sectioned level 4 lymph node (GRP 03/01/2023)   Final Diagnosis performed by Lance Coon, MD.   Electronically signed 03/02/2023 Technical and / or Professional components performed at [employer]. [facility], [address] [place], [place] [address].  Immunohistochemistry Technical component (if applicable) was performed at [facility]. [address], [place], [place] [address].   IMMUNOHISTOCHEMISTRY DISCLAIMER (if applicable): Some of these immunohistochemical stains may have been developed and the performance characteristics determine by [facility]. Some may not have been cleared or approved by the U.S. Food and Drug Administration. The FDA has determined that such clearance or approval is not necessary. This test is used for clinical purposes. It should not be regarded as investigational or for research. This laborato ry is certified under the Clinical Laboratory Improvement Amendments of 1988 (CLIA-88) as qualified to perform high complexity clinical laboratory testing.  The controls stained appropriately.   IHC stains are performed on formalin fixed, paraffin embedded tissue using a 3,3"diaminobenzidine (DAB) chromogen and Leica Bond Autostainer System. The staining intensity of the nucleus is score manually  and is reported as the percentage of tumor cell nuclei demonstrating specific nuclear staining. The specimens are fixed in 10% Neutral Formalin for at least 6 hours and up to 72hrs. These tests are validated on decalcified tissue. Results should be interpreted with caution given the possibility of false negative  results on decalcified specimens. Antibody Clones are as follows ER-clone 40F, PR-clone 16, Ki67- clone MM1. Some of these immunohistochemical stains may have been developed and the performance characteristics determined by [facility].     Discharge Medications:   Allergies as of 03/03/2023       Reactions   Penicillins Rash   Has patient had a PCN reaction causing immediate rash, facial/tongue/throat swelling, SOB or lightheadedness with hypotension: {Yes Has patient had a PCN reaction causing severe rash involving mucus membranes or skin necrosis:unknown Has patient had a PCN reaction that required hospitalizationNo Has patient had a PCN reaction occurring within the last 10 years:No If all of the above answers are "NO", then may proceed with Cephalosporin use.        Medication List     STOP taking these medications    oxyCODONE-acetaminophen 5-325 MG tablet Commonly known as: PERCOCET/ROXICET       TAKE these medications    atenolol 50 MG tablet Commonly known as: Tenormin Take 1 tablet (50 mg total) by mouth daily. What changed: when to take this   bacitracin ointment Apply topically every 8 (eight) hours.   desvenlafaxine 100 MG 24 hr tablet Commonly known as: PRISTIQ Take 1 tablet (100 mg total) by mouth daily.   EPINEPHrine 0.3 mg/0.3 mL Soaj injection Commonly known as: EPI-PEN Inject 0.3 mg into the muscle as needed for anaphylaxis.   HYDROcodone-acetaminophen 5-325 MG tablet Commonly known as: NORCO/VICODIN Take 1 tablet by mouth every 6 (six) hours as needed for up to 5 days for severe pain. What changed: reasons to take this   levocetirizine 5 MG tablet Commonly known as: XYZAL Take 1 tablet (5 mg total) by mouth daily. What changed: Another medication with the same name was added. Make sure you understand how and when to take each.   levocetirizine 5 MG tablet Commonly known as: XYZAL Take 1 tablet (5 mg total) by mouth daily. What  changed: You were already taking a medication with the same name, and this prescription was added. Make sure you understand how and when to take each.   levothyroxine 50 MCG tablet Commonly known as: SYNTHROID Take 1 tablet (50 mcg total) by mouth daily. To be taken WITH levothyroxine 200 mcg daily   levothyroxine 200 MCG tablet Commonly known as: Synthroid Take 1 tablet (200 mcg total) by mouth daily.   lidocaine 5 % Commonly known as: Lidoderm Place 1 patch onto the skin daily. Remove & Discard patch within 12 hours or as directed by MD   LORazepam 1 MG tablet Commonly known as: ATIVAN Take 1 mg by mouth at bedtime.   losartan 100 MG tablet Commonly known as: COZAAR Take 100 mg by mouth daily.   methylPREDNISolone 4 MG Tbpk tablet Commonly known as: MEDROL DOSEPAK Take as directed   multivitamin tablet Take 2 tablets by mouth daily. gummy   Neurontin 300 MG capsule Generic drug: gabapentin TAKE 1 CAPSULE BY MOUTH ONCE A DAY AT BEDTIME   NON FORMULARY Pt uses a cpap nightly   ondansetron 4 MG disintegrating tablet Commonly known as: ZOFRAN-ODT Take 1 tablet (4 mg total) by mouth every 8 (eight) hours as needed for  nausea or vomiting.   ondansetron 4 MG tablet Commonly known as: Zofran Take 1 tablet (4 mg total) by mouth daily as needed for up to 5 days for nausea or vomiting.   oxybutynin 10 MG 24 hr tablet Commonly known as: DITROPAN-XL Take 10 mg by mouth 2 (two) times daily.   pantoprazole 40 MG tablet Commonly known as: PROTONIX Take 40 mg by mouth 2 (two) times daily.   rosuvastatin 10 MG tablet Commonly known as: CRESTOR Take 10 mg by mouth daily.   tiZANidine 4 MG tablet Commonly known as: ZANAFLEX Take 1 tablet (4 mg total) by mouth every 8 (eight) hours as needed for 10 days.   Vitamin D3 50 MCG (2000 UT) Tabs Take 2,000 Units by mouth daily.        Diagnostic Studies: DG Lumbar Spine Complete  Result Date: 02/16/2023 CLINICAL DATA:  Low  back pain for the past week. EXAM: LUMBAR SPINE - COMPLETE 4+ VIEW COMPARISON:  None Available. FINDINGS: There are 5 non rib-bearing lumbar type vertebral bodies. The provided lateral radiographs are degraded due to obliquity. Mild scoliotic curvature of the thoracolumbar spine with dominant caudal component convex the left measuring approximately 9 degrees (as measured from the superior endplate of T12 to the superior endplate of L5). No definite anterolisthesis or retrolisthesis. Lumbar vertebral body heights are preserved. Mild multilevel lumbar spine DDD, worse at L2-L3 and L3-L4 with disc space height loss, endplate irregularity and sclerosis. Limited visualization of the bilateral SI joints is normal. Postcholecystectomy. Nonobstructive bowel gas pattern. IMPRESSION: 1. 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 2. Mild scoliotic curvature of the thoracolumbar spine with dominant caudal component convex the left measuring 9 degrees. Electronically Signed   By: Simonne Come M.D.   On: 02/16/2023 12:02    Disposition: Discharge disposition: 01-Home or Self Care       Discharge Instructions     Discharge patient   Complete by: As directed    Discharge disposition: 01-Home or Self Care   Discharge patient date: 03/03/2023          Signed: Scarlette Ar 03/03/2023, 8:56 AM

## 2023-03-06 ENCOUNTER — Other Ambulatory Visit (HOSPITAL_COMMUNITY): Payer: Self-pay

## 2023-03-06 DIAGNOSIS — C73 Malignant neoplasm of thyroid gland: Secondary | ICD-10-CM | POA: Diagnosis not present

## 2023-03-09 ENCOUNTER — Ambulatory Visit: Payer: 59 | Admitting: Family Medicine

## 2023-03-15 ENCOUNTER — Ambulatory Visit (INDEPENDENT_AMBULATORY_CARE_PROVIDER_SITE_OTHER): Payer: 59 | Admitting: Internal Medicine

## 2023-03-15 ENCOUNTER — Encounter: Payer: Self-pay | Admitting: Internal Medicine

## 2023-03-15 VITALS — BP 126/74 | HR 69 | Ht 63.0 in | Wt 325.0 lb

## 2023-03-15 DIAGNOSIS — E890 Postprocedural hypothyroidism: Secondary | ICD-10-CM

## 2023-03-15 DIAGNOSIS — C73 Malignant neoplasm of thyroid gland: Secondary | ICD-10-CM | POA: Diagnosis not present

## 2023-03-15 LAB — TSH: TSH: 0.03 u[IU]/mL — ABNORMAL LOW (ref 0.35–5.50)

## 2023-03-15 NOTE — Patient Instructions (Signed)

## 2023-03-15 NOTE — Progress Notes (Signed)
Name: Christine Mason  MRN/ DOB: [id number], 1971-03-09    Age/ Sex: 52 y.o., female     PCP: Benita Stabile, MD   Reason for Endocrinology Evaluation: Postoperative hypothyroidism/PTC     Initial Endocrinology Clinic Visit: 05/03/2020    PATIENT IDENTIFIER: Christine Mason is a 52 y.o., female with a past medical history of HTN, Hx of PTC. She has followed with [facility] since 05/03/2020 for consultative assistance with management of her postoperative hypothyroidism/PTC.   HISTORICAL SUMMARY: The patient was initially diagnosed with hyperthyroidism and MNG in 02/2020    She is S/P LLL nodule 02/2020 with cytology report consistent  with bethesda  category III , Afirma came back suspicious.   She is S/P total thyroidectomy 07/01/2020 with 1 cm PTC with  1 cm left thyroid tumor, unifocal , margins uninvolved. 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  Status post benign FNA on 01/18/2022.   Thyroid bed ultrasound 08/03/2022 showed interval enlargement of the left thyroid nodule at 1.7 cm.  She is S/P FNA 08/03/2022, despite no malignant cells the evaluation was considered limited due to abundance of histiocytes and inflammatory cells.   WBS  09/2022 no evidence of local neck recurrence or metastases  CT neck 10/2022 revealed 1.2 cm x 1.5 cm partially calcified left level IV node suspicious for nodal metastatic disease  She was evaluated by ENT 11/2022 (Dr. Pollyann Kennedy) , she is s/p  left leve II, III and IV  neck dissection 02/28/2023, 1 out of 26 lymph nodes were positive    Uncle with medullary cancer    I have attempted to screen her for Cushing syndrome but she did  not submit 24-hour urine cortisol  SUBJECTIVE:    Today (03/15/2023):  [name] is here for a follow up on hypothyroidism and Hx of PTC.  She is accompanied by her aunt today.  She is s/p neck dissection with Dr. Pollyann Kennedy 02/28/2023, pathology report positive of 1 out of 26 lymph nodes  She  having  left chest pain and tingling and difficulty  Has occasional palpitations  Denies tremors  Denies diarrhea but has constipation due to pain meds     Levothyroxine 200 mcg daily  Levothyroxine 50 mcg daily      HISTORY:  Past Medical History:  Past Medical History:  Diagnosis Date   Anxiety    Arthritis    Back pain    Carpal tunnel syndrome    bi lat hands   Chronic leg pain    right   Chronic pain of left knee    Depression    Diverticulitis    Fatty liver    GERD (gastroesophageal reflux disease)    Heart murmur    High cholesterol    History of kidney stones    Hypertension    Hypothyroidism    Joint pain    Pre-diabetes    Sleep apnea     no C- Pap   Swallowing difficulty    Thyroid cancer [facility])    Past Surgical History:  Past Surgical History:  Procedure Laterality Date   ACNE CYST REMOVAL  BREAST BIOPSY Right 12/22/2022   CHOLECYSTECTOMY     COLONOSCOPY     DILATION AND CURETTAGE OF UTERUS  1998   ESOPHAGOGASTRODUODENOSCOPY (EGD) WITH PROPOFOL N/A 06/13/2019   Procedure: ESOPHAGOGASTRODUODENOSCOPY (EGD) WITH PROPOFOL;  Surgeon: Malissa Hippo, MD;  Location: [facility];  Service: Endoscopy;  Laterality: N/A;  7:30   KNEE SURGERY Left    Meniscus Tear   MALONEY DILATION  06/13/2019   Procedure: MALONEY DILATION;  Surgeon: Malissa Hippo, MD;  Location: [facility];  Service: Endoscopy;;   MASS EXCISION     RADICAL NECK DISSECTION Left 02/28/2023   Procedure: MODIFIED NECK DISSECTION;  Surgeon: Serena Colonel, MD;  Location: [facility] OR;  Service: ENT;  Laterality: Left;   THORACOTOMY Right 2016   THYROIDECTOMY N/A 07/01/2020   Procedure: TOTAL  THYROIDECTOMY WITH Central compartment lymph node disection;  Surgeon: Darnell Level, MD;  Location: [facility] ORS;  Service: General;  Laterality: N/A;   Social History:  reports that she quit smoking about 5 years ago. Her smoking use included cigarettes. She has a 2.50 pack-year smoking history. She has never used smokeless tobacco. She reports that she does not drink alcohol and does not use drugs. Family History:  Family History  Problem Relation Age of Onset   Sleep apnea Mother    Cancer Mother    Hyperlipidemia Mother    Diabetes Mother    Hypertension Mother    Breast cancer Mother 85       dx again at 66; PALB2 and CHEK2 positive   Depression Mother    Obesity Mother    Stroke Father    Depression Father    Obesity Father    Hyperlipidemia Father    Diabetes Father    Hypertension Father    Coronary artery disease Father    Sleep apnea Father    Heart disease Father    Hypertension Sister    Breast cancer Sister        diagnosed in her 52's   Lung cancer Maternal Grandmother    Leukemia Maternal Grandfather 4       dx with hairy cell leukemia at 29; CLL at 23   Lymphoma Maternal Grandfather 21       hodgkins lymphoma   Brain cancer Paternal Grandmother    Prostate cancer Paternal Grandfather    Lung cancer Maternal Aunt 57   Thyroid cancer Maternal Uncle 46       medullary   Lymphoma Maternal Uncle 60       follicular lymphoma   Allergic rhinitis Neg Hx    Asthma Neg Hx      HOME MEDICATIONS: Allergies as of 03/15/2023       Reactions   Penicillins Rash   Has patient had a PCN reaction causing immediate rash, facial/tongue/throat swelling, SOB or lightheadedness with hypotension: {Yes Has patient had a PCN reaction causing severe rash involving mucus membranes or skin necrosis:unknown Has patient had a PCN reaction that required hospitalizationNo Has patient had a PCN reaction occurring within the last 10 years:No If all of the above answers are "NO", then may  proceed with Cephalosporin use.        Medication List        Accurate as of March 15, 2023  9:53 AM. If you have any questions, ask your nurse or doctor.          STOP taking these medications    lidocaine 5 % Commonly known as: Lidoderm Stopped  by: Scarlette Shorts, MD   methylPREDNISolone 4 MG Tbpk tablet Commonly known as: MEDROL DOSEPAK Stopped by: Scarlette Shorts, MD       TAKE these medications    atenolol 50 MG tablet Commonly known as: Tenormin Take 1 tablet (50 mg total) by mouth daily. What changed: when to take this   bacitracin ointment Apply topically every 8 (eight) hours.   desvenlafaxine 100 MG 24 hr tablet Commonly known as: PRISTIQ Take 1 tablet (100 mg total) by mouth daily.   EPINEPHrine 0.3 mg/0.3 mL Soaj injection Commonly known as: EPI-PEN Inject 0.3 mg into the muscle as needed for anaphylaxis.   levocetirizine 5 MG tablet Commonly known as: XYZAL Take 1 tablet (5 mg total) by mouth daily.   levocetirizine 5 MG tablet Commonly known as: XYZAL Take 1 tablet (5 mg total) by mouth daily.   levothyroxine 50 MCG tablet Commonly known as: SYNTHROID Take 1 tablet (50 mcg total) by mouth daily. To be taken WITH levothyroxine 200 mcg daily   levothyroxine 200 MCG tablet Commonly known as: Synthroid Take 1 tablet (200 mcg total) by mouth daily.   LORazepam 1 MG tablet Commonly known as: ATIVAN Take 1 mg by mouth at bedtime.   losartan 100 MG tablet Commonly known as: COZAAR Take 100 mg by mouth daily.   multivitamin tablet Take 2 tablets by mouth daily. gummy   Neurontin 300 MG capsule Generic drug: gabapentin TAKE 1 CAPSULE BY MOUTH ONCE A DAY AT BEDTIME   NON FORMULARY Pt uses a cpap nightly   ondansetron 4 MG disintegrating tablet Commonly known as: ZOFRAN-ODT Take 1 tablet (4 mg total) by mouth every 8 (eight) hours as needed for nausea or vomiting.   oxybutynin 10 MG 24 hr tablet Commonly known as:  DITROPAN-XL Take 10 mg by mouth 2 (two) times daily.   pantoprazole 40 MG tablet Commonly known as: PROTONIX Take 40 mg by mouth 2 (two) times daily.   rosuvastatin 10 MG tablet Commonly known as: CRESTOR Take 10 mg by mouth daily.   tiZANidine 4 MG tablet Commonly known as: ZANAFLEX Take 1 tablet (4 mg total) by mouth every 8 (eight) hours as needed for 10 days.   Vitamin D3 50 MCG (2000 UT) Tabs Take 2,000 Units by mouth daily.          OBJECTIVE:   PHYSICAL EXAM: VS: BP 126/74 (BP Location: Left Arm, Patient Position: Sitting, Cuff Size: Large)   Pulse 69   Ht 5\' 3"  (1.6 m)   Wt (!) 325 lb (147.4 kg)   LMP 05/22/2015   SpO2 98%   BMI 57.57 kg/m    EXAM: General: Pt appears well and is in NAD  Neck: General: Supple without adenopathy. Thyroid:   No goiter or nodules appreciated., Left neck incision with staples  Lungs: Clear with good BS bilat with no rales, rhonchi, or wheezes  Heart: Auscultation: RRR.  Mental Status: Judgment, insight: Intact Orientation: Oriented to time, place, and person Mood and affect: No depression, anxiety, or agitation     DATA REVIEWED:    Latest Reference Range & Units 03/15/23 10:56  TSH 0.35 - 5.50 uIU/mL 0.03 (L)  (L): Data is abnormally low   Latest Reference Range & Units 02/20/23 10:07  Sodium 135 - 145 mmol/L 139  Potassium 3.5 - 5.1 mmol/L 3.6  Chloride 98 - 111 mmol/L 103  CO2 22 - 32 mmol/L 28  Glucose 70 - 99 mg/dL 161 (H)  BUN  6 - 20 mg/dL 12  Creatinine 6.04 - 5.40 mg/dL 9.81  Calcium 8.9 - 19.1 mg/dL 9.3  Anion gap 5 - 15  8  Alkaline Phosphatase 38 - 126 U/L 62  Albumin 3.5 - 5.0 g/dL 4.0  AST 15 - 41 U/L 27  ALT 0 - 44 U/L 32  Total Protein 6.5 - 8.1 g/dL 7.1  Total Bilirubin 0.3 - 1.2 mg/dL 0.7  GFR, Estimated >47 mL/min >60  (H): Data is abnormally high   Thyroid Pathology 07/01/2020   Clinical History: Thyroid neoplasm of uncertain behavior, multiple  thyroid nodules (crm)      FINAL  MICROSCOPIC DIAGNOSIS:   A. THYROID, TOTAL THYROIDECTOMY:  - Papillary thyroid carcinoma, classical variant, 1 cm, involving  inferior pole of the right lobe  - Papillary thyroid carcinoma, classical variant, involving left lobe  - Carcinoma is confined to thyroid gland without extrathyroidal  extension  - Resection margins are negative for carcinoma  - Negative for lymphovascular invasion  - Benign hyperplastic nodule, 2.5 cm, of the right lobe  - See oncology table   B. 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 Current dimensions 1.4 cm x 1.6 cm x 1.7 cm, previously 1.3 cm x 1.2 cm 1.5 cm.   Additional small focal hypodensities medial to the carotid sheath in the surgical thyroid bed, 6 mm and unchanged from the prior.   IMPRESSION: Total thyroidectomy, with redemonstration of soft tissue nodule lateral to the left carotid sheath. This remains suspicious for residual/recurrence, particularly given slight interval enlargement to a maximum dimension of 1.7 cm. Either a repeat biopsy or further evaluation with nuclear medicine testing may be useful.     FNA left nodule 01/18/2022   FINAL MICROSCOPIC DIAGNOSIS:  - Consistent with benign follicular nodule (Bethesda category II)   FNA left nodule 08/03/2022  Clinical History: 1.7 cm LLL, left thyroid bed nodule; Hx thyroidectomy 2021, papillary thyroid cancer Specimen Submitted:  A. THYROID, LLL, FINE NEEDLE ASPIRATION:   FINAL MICROSCOPIC DIAGNOSIS: - No malignant cells identified - See comment  Comment: While no malignant cells are identified, evaluation is limited by a paucicellular specimen consisting  predominantly of histiocytes and chronic inflammatory cells.    WBS 09/27/2022  FINDINGS: No focal activity in thyroid bed. No abnormal uptake on whole-body I 131 scan. Physiologic uptake noted in the GI and GU tract.   IMPRESSION: No evidence of local thyroid cancer recurrence or distant metastatic disease.  CT neck 11/06/2022  Status post thyroidectomy without abnormal soft tissue density in the thyroidectomy bed. 2. 1.2 cm x 1.5 cm partially calcified left level IV node suspicious for nodal metastatic disease. A 0.8 cm short axis left level II node is indeterminate. No other pathologic lymphadenopathy in the imaged neck.   Pathology report 02/28/2023   FINAL MICROSCOPIC DIAGNOSIS:  A. 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      Metastatic focus: 12 mm in maximal dimension.      No extranodal extension identified.   Old records , labs and images have been reviewed.    ASSESSMENT /  PLAN / RECOMMENDATIONS:   PTC with recurrence:   -S/p total thyroidectomy with 1 cm papillary thyroid carcinoma on the left, 2/2 lymph node positive for malignancy (06/2020)  - S/P RAI 98.4 mCi I-131 sodium iodide on 08/20/2020 post treatment WBS was showed tracer activity at the left thyroid bed  -Thyroid bed ultrasound revealed 1.5 cm left neck nodule (12/2021), s/p benign FNA May/2023.  Due to interval enlargement we attempted another FNA 08/03/2022 but despite no evidence of malignant cells the sample was considered of limited evaluation - WBS 09/2022 did not show any evidence of local thyroid recurrence -CT neck 10/2022 revealed 1.5 cm partially calcified left suspicious nodule. -S/p left neck dissection with 1 out of 26 lymph nodes was positive for metastatic PTC -Patient is at moderate risk for recurrence -TSH goal 0.1-0.5 uIU/mL  - Tg and Tg Ab pending  today -TSH is slightly lower than the goal, but I would not make any changes at this time as her TSH has been fluctuating, once this stabilizes we will make adjustments if needed   2. Postoperative hypothyroidism   -- Pt educated extensively on the correct way to take levothyroxine (first thing in the morning with water, 30 minutes before eating or taking other medications). - Pt encouraged to double dose the following day if she were to miss a dose given long half-life of levothyroxine.   Medications  Continue levothyroxine 200 mcg daily PLUS  Continue  levothyroxine 50 mcg daily     Follow-up in 6 months     Signed electronically by: Lyndle Herrlich, MD  [facility]  [facility] [id number] [place] [place], [place] [address] Phone: [phone] FAX: [phone]      CC: Benita Stabile, MD [address] [place] [address] Phone: [phone]  Fax: [phone]   Return to Endocrinology clinic as below: Future Appointments  Date Time Provider Department Center  05/03/2023 11:30 AM Serena Croissant, MD [facility] None

## 2023-03-16 ENCOUNTER — Encounter: Payer: Self-pay | Admitting: Internal Medicine

## 2023-03-16 LAB — THYROGLOBULIN LEVEL: Thyroglobulin: <0.1 ng/mL — ABNORMAL LOW

## 2023-03-16 LAB — THYROGLOBULIN ANTIBODY: Thyroglobulin Ab: <1 IU/mL (ref <=1)

## 2023-03-20 DIAGNOSIS — C73 Malignant neoplasm of thyroid gland: Secondary | ICD-10-CM | POA: Diagnosis not present

## 2023-03-21 ENCOUNTER — Ambulatory Visit: Payer: 59 | Admitting: Cardiology

## 2023-03-21 ENCOUNTER — Encounter: Payer: Self-pay | Admitting: Cardiology

## 2023-03-21 VITALS — BP 148/83 | Resp 12 | Ht 63.0 in | Wt 331.0 lb

## 2023-03-21 DIAGNOSIS — R0789 Other chest pain: Secondary | ICD-10-CM

## 2023-03-21 DIAGNOSIS — I10 Essential (primary) hypertension: Secondary | ICD-10-CM

## 2023-03-21 DIAGNOSIS — Z6841 Body Mass Index (BMI) 40.0 and over, adult: Secondary | ICD-10-CM | POA: Diagnosis not present

## 2023-03-21 MED ORDER — SPIRONOLACTONE-HCTZ 25-25 MG PO TABS
1.0000 | ORAL_TABLET | ORAL | 2 refills | Status: DC
Start: 2023-03-21 — End: 2023-06-09
  Filled 2023-04-17: qty 60, 60d supply, fill #0

## 2023-03-21 NOTE — Progress Notes (Unsigned)
Primary Physician/Referring:  Benita Stabile, MD  Patient ID: Christine Mason, female    DOB: 01-24-1971, 52 y.o.   MRN: [id number]  Chief Complaint  Patient presents with   Chest  discomfort   HPI:    Christine Mason  is a 52 y.o. Caucasian female with hypertension, hyperlipidemia, fatty liver, papillary thyroid carcinoma SP postoperative (02/28/2023) hypothyroidism and on thyroid supplements, morbid obesity who presents for evaluation and management of chest pain and dyspnea on exertion.  Patient states that she has been having left-sided chest pain since surgery, movements of her body hurts and also when she coughs it hurts, she has significant tenderness left upper chest area.  She was concerned in view of obesity, hypertension, prediabetes, wanted to make sure that this is not angina pectoris.  Past Medical History:  Diagnosis Date   Anxiety    Arthritis    Back pain    Carpal tunnel syndrome    bi lat hands   Chronic leg pain    right   Chronic pain of left knee    Depression    Diverticulitis    Fatty liver    GERD (gastroesophageal reflux disease)    Heart murmur    High cholesterol    History of kidney stones    Hypertension    Hypothyroidism    Joint pain    Pre-diabetes    Sleep apnea     no C- Pap   Swallowing difficulty    Thyroid cancer [facility])    Past Surgical History:  Procedure Laterality Date   ACNE CYST REMOVAL     BREAST BIOPSY Right 12/22/2022   CHOLECYSTECTOMY     COLONOSCOPY     DILATION AND CURETTAGE OF UTERUS  1998   ESOPHAGOGASTRODUODENOSCOPY (EGD) WITH PROPOFOL N/A 06/13/2019   Procedure: ESOPHAGOGASTRODUODENOSCOPY (EGD) WITH PROPOFOL;  Surgeon: Malissa Hippo, MD;  [facility];  Service: Endoscopy;  Laterality: N/A;  7:30   KNEE SURGERY Left    Meniscus Tear   MALONEY DILATION  06/13/2019   Procedure: MALONEY DILATION;  Surgeon: Malissa Hippo, MD;  [facility];  Service: Endoscopy;;   MASS EXCISION     RADICAL  NECK DISSECTION Left 02/28/2023   Procedure: MODIFIED NECK DISSECTION;  Surgeon: Serena Colonel, MD;  Location: [facility];  Service: ENT;  Laterality: Left;   THORACOTOMY Right 2016   THYROIDECTOMY N/A 07/01/2020   Procedure: TOTAL THYROIDECTOMY WITH Central compartment lymph node disection;  Surgeon: Darnell Level, MD;  Location: [facility];  Service: General;  Laterality: N/A;   Family History  Problem Relation Age of Onset   Sleep apnea Mother    Cancer Mother    Hyperlipidemia Mother    Diabetes Mother    Hypertension Mother    Breast cancer Mother 31       dx again at 68; PALB2 and CHEK2 positive   Depression Mother    Obesity Mother    Stroke Father    Depression Father    Obesity Father    Hyperlipidemia Father    Diabetes Father    Hypertension Father    Coronary artery disease Father    Sleep apnea Father    Heart disease Father    Hypertension Sister    Breast cancer Sister        diagnosed in her 71's   Lung cancer Maternal Aunt 57   Thyroid cancer Maternal Uncle 46       medullary  Lymphoma Maternal Uncle 60       follicular lymphoma   Heart attack Maternal Uncle    Lung cancer Maternal Grandmother    Leukemia Maternal Grandfather 41       dx with hairy cell leukemia at 54; CLL at 44   Lymphoma Maternal Grandfather 12       hodgkins lymphoma   Brain cancer Paternal Grandmother    Prostate cancer Paternal Grandfather    Allergic rhinitis Neg Hx    Asthma Neg Hx     Social History   Tobacco Use   Smoking status: Former    Packs/day: 0.50    Years: 5.00    Additional pack years: 0.00    Total pack years: 2.50    Types: Cigarettes    Quit date: 09/15/2017    Years since quitting: 5.5   Smokeless tobacco: Never  Substance Use Topics   Alcohol use: No   Marital Status: Married  ROS  Review of Systems  Cardiovascular:  Positive for chest pain and dyspnea on exertion. Negative for leg swelling.  Respiratory:  Negative for snoring.    Objective  Blood  pressure (!) 148/83, resp. rate 12, height 5\' 3"  (1.6 m), weight (!) 331 lb (150.1 kg), last menstrual period 05/22/2015. Body mass index is 58.63 kg/m.     03/21/2023    2:01 PM 03/15/2023    9:46 AM 03/03/2023    7:26 AM  Vitals with BMI  Height 5\' 3"  5\' 3"    Weight 331 lbs 325 lbs   BMI 58.65 57.59   Systolic 148 126 409  Diastolic 83 74 83  Pulse  69 70     Physical Exam Constitutional:      Comments: Morbidly obese in no acute distress.  Neck:     Vascular: No carotid bruit or JVD.  Cardiovascular:     Rate and Rhythm: Normal rate and regular rhythm.     Pulses: Normal pulses and intact distal pulses.     Heart sounds: Normal heart sounds. No murmur heard.    No gallop.  Pulmonary:     Effort: Pulmonary effort is normal.     Breath sounds: Normal breath sounds.  Chest:  Breasts:    Right: Tenderness (Throughout the upper chest area bilateral) present.  Abdominal:     General: Bowel sounds are normal.     Palpations: Abdomen is soft.     Comments: Obese. Pannus present  Musculoskeletal:     Right lower leg: Edema (1-2+ pitting below knee) present.     Left lower leg: Edema (1-2+ pitting below knee) present.      Laboratory examination:   Recent Labs    06/27/22 1001 02/20/23 1007  NA 140 139  K 4.5 3.6  CL 99 103  CO2 22 28  GLUCOSE 85 116*  BUN 14 12  CREATININE 0.77 0.64  CALCIUM 9.7 9.3  GFRNONAA  --  >60      Latest Ref Rng & Units 02/20/2023   10:07 AM 06/27/2022   10:01 AM 01/12/2021    3:47 PM  CMP  Glucose 70 - 99 mg/dL 811  85  83   BUN 6 - 20 mg/dL 12  14  13    Creatinine 0.44 - 1.00 mg/dL 9.14  7.82  9.56   Sodium 135 - 145 mmol/L 139  140  142   Potassium 3.5 - 5.1 mmol/L 3.6  4.5  4.1   Chloride 98 - 111 mmol/L 103  99  99   CO2 22 - 32 mmol/L 28  22  25    Calcium 8.9 - 10.3 mg/dL 9.3  9.7  9.9   Total Protein 6.5 - 8.1 g/dL 7.1  7.5    Total Bilirubin 0.3 - 1.2 mg/dL 0.7  0.4    Alkaline Phos 38 - 126 U/L 62  76    AST 15 - 41 U/L  27  28    ALT 0 - 44 U/L 32  33        Latest Ref Rng & Units 02/20/2023   10:07 AM 06/27/2022   10:01 AM 12/21/2020    6:01 AM  CBC  WBC 4.0 - 10.5 K/uL 9.4  6.6  7.7   Hemoglobin 12.0 - 15.0 g/dL 44.0  10.2  72.5   Hematocrit 36.0 - 46.0 % 39.5  41.8  43.3   Platelets 150 - 400 K/uL 325  332  305     TSH Recent Labs    06/27/22 1001 03/15/23 1056  TSH 3.340 0.03*   External labs:   Cholesterol, total 170.000 m 08/24/2022 HDL 58.000 mg 08/24/2022 LDL 93.000 mg 08/24/2022 Triglycerides 105.000 m 08/24/2022  A1C 5.700 % 08/24/2022 TSH 0.030 03/15/2023  Hemoglobin 12.700 g/d 02/20/2023 Platelets 325.000 K/ 02/20/2023  Creatinine, Serum 0.640 mg/ 02/20/2023 Potassium 3.600 mm 02/20/2023 ALT (SGPT) 32.000 U/L 02/20/2023  Labs 02/02/2021:   Vitamin D 29.1, mildly reduced. Radiology:   No results found.  Cardiac Studies:   PCV ECHOCARDIOGRAM COMPLETE 03/22/2021  Narrative Echocardiogram 03/22/2021: Left ventricle cavity is normal in size and wall thickness. Normal global wall motion. Normal LV systolic function with EF 59%. Normal diastolic filling pattern. Mild tricuspid regurgitation. Estimated pulmonary artery systolic pressure 26 mmHg.    PCV MYOCARDIAL PERFUSION WO LEXISCAN 04/11/2021  Narrative Exercise Sestamibi stress test 04/11/2021: Exercise nuclear stress test was performed using Bruce protocol. Patient reached 6.2 METS, and 88% of age predicted maximum heart rate. Exercise capacity was low. No chest pain reported. Normal heart rate response. Hypertensive exercise response with peak BP 222/118 mmHg.  Stress EKG revealed no ischemic changes. Decreased tracer uptake in inferior myocardium at rest and stress images likely due to breast attenuation-imaging performed in sitting position. Stress LVEF 74% with no wall motion and thickening abnormality. Low risk study.  Coronary calcium score 04/02/2021: Total Agatston Score: 0. MESA database percentile: 0 Ascending Aorta: 31 mm  Descending Aorta: 23 mm. Elevated right hemidiaphragm stable.   EKG:   EKG 03/21/2023: Baseline artifact.  Normal sinus rhythm at rate of 87 bpm, normal EKG.  Compared to 06/27/2022, no significant change, baseline artifact not present.   Medications and allergies   Allergies  Allergen Reactions   Penicillins Rash    Has patient had a PCN reaction causing immediate rash, facial/tongue/throat swelling, SOB or lightheadedness with hypotension: {Yes Has patient had a PCN reaction causing severe rash involving mucus membranes or skin necrosis:unknown Has patient had a PCN reaction that required hospitalizationNo Has patient had a PCN reaction occurring within the last 10 years:No If all of the above answers are "NO", then may proceed with Cephalosporin use.     Current Outpatient Medications:    atenolol (TENORMIN) 50 MG tablet, Take 1 tablet (50 mg total) by mouth daily. (Patient taking differently: Take 50 mg by mouth at bedtime.), Disp: , Rfl:    bacitracin ointment, Apply topically every 8 (eight) hours., Disp: 120 g, Rfl: 0   Cholecalciferol (VITAMIN D3) 50 MCG (2000 UT)  TABS, Take 2,000 Units by mouth daily., Disp: , Rfl:    desvenlafaxine (PRISTIQ) 100 MG 24 hr tablet, Take 1 tablet (100 mg total) by mouth daily., Disp: , Rfl:    EPINEPHrine 0.3 mg/0.3 mL IJ SOAJ injection, Inject 0.3 mg into the muscle as needed for anaphylaxis., Disp: 2 each, Rfl: 1   levocetirizine (XYZAL) 5 MG tablet, Take 1 tablet (5 mg total) by mouth daily., Disp: 30 tablet, Rfl: 0   levothyroxine (SYNTHROID) 200 MCG tablet, Take 1 tablet (200 mcg total) by mouth daily., Disp: 90 tablet, Rfl: 3   levothyroxine (SYNTHROID) 50 MCG tablet, Take 1 tablet (50 mcg total) by mouth daily. To be taken WITH levothyroxine 200 mcg daily, Disp: 90 tablet, Rfl: 3   LORazepam (ATIVAN) 1 MG tablet, Take 1 mg by mouth at bedtime. , Disp: , Rfl:    losartan (COZAAR) 100 MG tablet, Take 100 mg by mouth daily. , Disp: , Rfl:     Multiple Vitamin (MULTIVITAMIN) tablet, Take 2 tablets by mouth daily. gummy, Disp: , Rfl:    NEURONTIN 300 MG capsule, TAKE 1 CAPSULE BY MOUTH ONCE A DAY AT BEDTIME, Disp: 90 capsule, Rfl: 3   NON FORMULARY, Pt uses a cpap nightly, Disp: , Rfl:    ondansetron (ZOFRAN-ODT) 4 MG disintegrating tablet, Take 1 tablet (4 mg total) by mouth every 8 (eight) hours as needed for nausea or vomiting., Disp: 10 tablet, Rfl: 0   oxybutynin (DITROPAN-XL) 10 MG 24 hr tablet, Take 10 mg by mouth 2 (two) times daily., Disp: , Rfl:    pantoprazole (PROTONIX) 40 MG tablet, Take 40 mg by mouth 2 (two) times daily., Disp: , Rfl:    rosuvastatin (CRESTOR) 10 MG tablet, Take 10 mg by mouth daily., Disp: , Rfl:    spironolactone-hydrochlorothiazide (ALDACTAZIDE) 25-25 MG tablet, Take 1 tablet by mouth every morning., Disp: 30 tablet, Rfl: 2   tiZANidine (ZANAFLEX) 4 MG tablet, Take 1 tablet (4 mg total) by mouth every 8 (eight) hours as needed for 10 days., Disp: 30 tablet, Rfl: 0   Assessment     ICD-10-CM   1. Non-cardiac chest pain  R07.89 EKG 12-Lead    2. Primary hypertension  I10 spironolactone-hydrochlorothiazide (ALDACTAZIDE) 25-25 MG tablet    Basic metabolic panel    3. Class 3 severe obesity due to excess calories without serious comorbidity with body mass index (BMI) of 50.0 to 59.9 in adult [facility])  E66.01    Z68.43       Medications Discontinued During This Encounter  Medication Reason   levocetirizine (XYZAL) 5 MG tablet     Meds ordered this encounter  Medications   spironolactone-hydrochlorothiazide (ALDACTAZIDE) 25-25 MG tablet    Sig: Take 1 tablet by mouth every morning.    Dispense:  30 tablet    Refill:  2    Orders Placed This Encounter  Procedures   Basic metabolic panel   EKG 12-Lead   Recommendations:   Christine Mason is a 52 y.o. Caucasian female with hypertension, hyperlipidemia, fatty liver, papillary thyroid carcinoma SP postoperative (02/28/2023) hypothyroidism and on  thyroid supplements, morbid obesity who presents for evaluation and management of chest pain and dyspnea on exertion.  1. Non-cardiac chest pain Chest pain symptoms are clearly noncardiac and musculoskeletal.  I reassured her.  No ischemic changes on EKG. - EKG 12-Lead  2. Primary hypertension Blood pressure is elevated, she has gained more weight since I last saw her 2 years ago.  Both  for bilateral leg edema, hypertension, I will start her on Aldactazide 25/25 mg daily in the morning, will obtain BMP in 2 to 3 weeks.  Significantly stable for hypertension management, she can follow-up with her PCP regarding further management. - spironolactone-hydrochlorothiazide (ALDACTAZIDE) 25-25 MG tablet; Take 1 tablet by mouth every morning.  Dispense: 30 tablet; Refill: 2 - Basic metabolic panel  3. Class 3 severe obesity due to excess calories without serious comorbidity with body mass index (BMI) of 50.0 to 59.9 in adult [facility]) Patient's BMI is markedly elevated, she continues to have difficulty with weight loss, could try phentermine with the combination of Wellbutrin for weight loss with close monitoring of her blood pressure.  I will send this note to her PCP for consideration.  Otherwise from cardiac standpoint, coronary calcium score being 0, evaluation normal, normal EKG, negative nuclear stress test in July 2022, I will see her back on a as needed basis.     Yates Decamp, MD, [facility] 03/21/2023, 2:55 PM Office: [phone]

## 2023-03-30 ENCOUNTER — Ambulatory Visit (HOSPITAL_COMMUNITY): Payer: 59 | Attending: Otolaryngology | Admitting: Physical Therapy

## 2023-03-30 ENCOUNTER — Other Ambulatory Visit: Payer: Self-pay

## 2023-03-30 DIAGNOSIS — M25612 Stiffness of left shoulder, not elsewhere classified: Secondary | ICD-10-CM | POA: Diagnosis not present

## 2023-03-30 DIAGNOSIS — M25512 Pain in left shoulder: Secondary | ICD-10-CM | POA: Insufficient documentation

## 2023-03-30 DIAGNOSIS — M6281 Muscle weakness (generalized): Secondary | ICD-10-CM | POA: Diagnosis not present

## 2023-03-30 DIAGNOSIS — M25611 Stiffness of right shoulder, not elsewhere classified: Secondary | ICD-10-CM | POA: Diagnosis not present

## 2023-03-30 NOTE — Therapy (Addendum)
OUTPATIENT PHYSICAL THERAPY SHOULDER EVALUATION   Patient Name: Christine Mason MRN: [id number] DOB:September 08, 1971, 52 y.o., female Today's Date: 03/30/2023  END OF SESSION:  PT End of Session - 03/30/23 0802     Visit Number 1    Number of Visits 12    Date for PT Re-Evaluation 05/11/23    Authorization Type [facility]    PT Start Time 0805    PT Stop Time 0845    PT Time Calculation (min) 40 min    Activity Tolerance Patient tolerated treatment well    Behavior During Therapy [facility] for tasks assessed/performed             Past Medical History:  Diagnosis Date   Anxiety    Arthritis    Back pain    Carpal tunnel syndrome    bi lat hands   Chronic leg pain    right   Chronic pain of left knee    Depression    Diverticulitis    Fatty liver    GERD (gastroesophageal reflux disease)    Heart murmur    High cholesterol    History of kidney stones    Hypertension    Hypothyroidism    Joint pain    Pre-diabetes    Sleep apnea     no C- Pap   Swallowing difficulty    Thyroid cancer [facility])    Past Surgical History:  Procedure Laterality Date   ACNE CYST REMOVAL     BREAST BIOPSY Right 12/22/2022   CHOLECYSTECTOMY     COLONOSCOPY     DILATION AND CURETTAGE OF UTERUS  1998   ESOPHAGOGASTRODUODENOSCOPY (EGD) WITH PROPOFOL N/A 06/13/2019   Procedure: ESOPHAGOGASTRODUODENOSCOPY (EGD) WITH PROPOFOL;  Surgeon: Malissa Hippo, MD;  [facility];  Service: Endoscopy;  Laterality: N/A;  7:30   KNEE SURGERY Left    Meniscus Tear   MALONEY DILATION  06/13/2019   Procedure: MALONEY DILATION;  Surgeon: Malissa Hippo, MD;  [facility];  Service: Endoscopy;;   MASS EXCISION     RADICAL NECK DISSECTION Left 02/28/2023   Procedure: MODIFIED NECK DISSECTION;  Surgeon: Serena Colonel, MD;  Location: [facility] OR;  Service: ENT;  Laterality: Left;   THORACOTOMY Right 2016   THYROIDECTOMY N/A 07/01/2020   Procedure: TOTAL THYROIDECTOMY WITH Central compartment  lymph node disection;  Surgeon: Darnell Level, MD;  [facility];  Service: General;  Laterality: N/A;   Patient Active Problem List   Diagnosis Date Noted   Thyroid cancer (HCC) 02/28/2023   Multiple thyroid nodules 08/24/2022   Hepatic vein stenosis 08/24/2022   Depression 07/11/2022   Other fatigue 06/27/2022   SOBOE (shortness of breath on exertion) 06/27/2022   Osteoarthritis of both knees 06/27/2022   Pre-diabetes 06/27/2022   Other hyperlipidemia 06/27/2022   Depression screening 06/27/2022   Seasonal and perennial allergic rhinitis 03/08/2022   Wheeze 03/08/2022   Gastroesophageal reflux disease 03/08/2022   Hx of papillary thyroid carcinoma 04/14/2021   Status post total thyroidectomy 04/14/2021   Unilateral primary osteoarthritis, left knee 03/24/2021   Chronic pain of left knee 03/24/2021   Morbid obesity (HCC) 02/09/2021   Abnormal liver function tests 02/09/2021   Chest pain 02/09/2021   Chronic low back pain 02/09/2021   Generalized hyperhidrosis 02/09/2021   Major depressive disorder 02/09/2021   Palpitations 02/09/2021   Urinary incontinence 02/09/2021   Impaired fasting glucose 02/01/2021   Essential hypertension 02/01/2021   Mixed hyperlipidemia 02/01/2021  Vitamin D deficiency 02/01/2021   Hypothyroidism 01/12/2021   Hypokalemia 01/12/2021   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   Neoplasm of uncertain behavior of thyroid gland 06/29/2020   Oropharyngeal dysphagia 05/06/2019   Dysphagia, pharyngoesophageal phase 05/05/2019   Vasomotor symptoms due to menopause 03/13/2019   Genetic testing 06/03/2015   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 Wisner states that she was not having any problem with her left arm until she had surgery on her neck surgery on 02/28/23.  After the surgery she noted that she has significant decreased use of her Lt arm.  Hand dominance: Right  PERTINENT HISTORY: 2021 thyroidectomy, radical neck dissection with 26 lymphnodes removed on 02/28/23   PAIN:  Are you having pain? Yes: NPRS scale: 5/10 Pain location: anterior Lt chest went to a cardiologist who stated everything was fine.  Pain description: achy Aggravating factors: constant  Relieving factors: time   PRECAUTIONS: None     WEIGHT BEARING RESTRICTIONS: No  FALLS:  Has patient fallen in last 6 months? No  LIVING ENVIRONMENT: Lives with: lives with their family Lives in: House/apartment OCCUPATION: Nurse tech III mainly sitting   PLOF: Independent  PATIENT GOALS:To be able to use her RT arm again   NEXT MD VISIT: October   OBJECTIVE:   PATIENT SURVEYS:  FOTO 44  COGNITION: Overall cognitive status: Within functional limits for tasks assessed     SENSATION: Per pt she is numb in her face and neck   POSTURE: Rounded shoulder   UPPER EXTREMITY ROM:  supine   Active ROM Right eval Left eval  Shoulder flexion 145 115  Shoulder extension    Shoulder abduction 135 85  Shoulder adduction    Shoulder internal rotation wfl Wfl  Shoulder external rotation 60 50  Elbow flexion    Elbow extension    Wrist flexion    Wrist extension    Wrist ulnar deviation    Wrist radial deviation    Wrist pronation    Wrist supination    (Blank rows = not tested)  UPPER EXTREMITY MMT:  MMT Right eval Left eval  Shoulder flexion 5  2+  Shoulder extension  3  Shoulder abduction 4 2+  Shoulder adduction    Shoulder internal  rotation 5 5  Shoulder external rotation 3/5 3/5  Middle trapezius    Lower trapezius    Elbow flexion    Elbow extension    Wrist flexion    Wrist extension    Wrist ulnar deviation    Wrist radial deviation    Wrist pronation    Wrist supination    Grip strength (lbs)    (Blank rows = not tested)       TODAY'S TREATMENT:                                                                                                                                         DATE: 03/30/23: evaluation    Seated Correct Posture with scapular retraction x  10 reps - 5" hold - Seated Bilateral Shoulder External Rotation with red theraband  10 reps - 5" hold - Supine Shoulder Flexion Extension AAROM with Dowel   10 reps - Supine Shoulder Abduction AAROM with Dowel    PATIENT EDUCATION: Education details: HEP Person educated: Patient Education method: Chief Technology Officer Education comprehension: verbalized understanding and returned demonstration  HOME EXERCISE PROGRAM: Access Code: [email or web address] URL: [email or web address] Date: 03/30/2023 Prepared by: Virgina Organ  Exercises - Seated Correct Posture  - 5 x daily - 7 x weekly - 1 sets - 10 reps - 5" hold - Seated Scapular Retraction  - 3 x daily - 7 x weekly - 1 sets - 10 reps - 5" hold - Seated Bilateral Shoulder External Rotation with Resistance  - 3 x daily - 7 x weekly - 1 sets - 10 reps - 5" hold - Supine Shoulder Flexion Extension AAROM with Dowel  - 1 x daily - 7 x weekly - 3 sets - 10 reps - Supine Shoulder Abduction AAROM with Dowel  - 1 x daily - 7 x weekly - 3 sets - 10 reps  ASSESSMENT:  CLINICAL IMPRESSION: Patient is a 52 y.o. female who was seen today for physical therapy evaluation and treatment for Left shoulder pain.  Evaluation demonstrates decreased activity tolerance, decreased strength and increased pain.  Ms. Schwahn will benefit from skilled PT to address these issues and return her to previous  functioning level.   OBJECTIVE IMPAIRMENTS: decreased activity tolerance, decreased strength, and pain.   ACTIVITY LIMITATIONS: carrying, lifting, and reach over head  PARTICIPATION LIMITATIONS: cleaning  PERSONAL FACTORS: Time since onset of injury/illness/exacerbation and 1 comorbidity: OA  are also affecting patient's functional outcome.   REHAB POTENTIAL: Good  CLINICAL DECISION MAKING: Stable/uncomplicated  EVALUATION COMPLEXITY: Low   GOALS: Goals reviewed with patient? No  SHORT TERM GOALS: Target date: 04/20/23  PT to be I in HEP in order to decrease pain in Lt shoulder to no greater than a 5/10 Baseline: Goal status: INITIAL  2.  ROM in Lt shoulder to be increased 20 degrees to be able to lift items to shelves that are above shoulder height Goal status: INITIAL  3.   Strength in Lt shoulder to be increased 1/2 grade to be able to be able to lift items to shelves that are above shoulder height. Baseline:  Goal status: INITIAL  LONG TERM GOALS: Target date: 05/11/23  PT to be I in HEP in order to decrease pain in Lt shoulder to no greater than a 1/10 Baseline:  Goal status: INITIAL  2.  Strength in Lt shoulder to be increased 40 degrees to be able to lift items to shelves that are over head  height. Baseline:  Goal status: INITIAL  3.  Strength in Lt shoulder to be increased 1 grade to be able to lift items to shelves that are over head  height. Baseline:  Goal status: INITIAL  4.  Pt to be able to complete all housework activity Baseline:  Goal status: INITIAL  5.  Pt to be able to complete all work activity Baseline:  Goal status: INITIAL  PLAN:  PT FREQUENCY: 2x/week  PT DURATION: 6 weeks  PLANNED INTERVENTIONS: Therapeutic exercises, Patient/Family education, Self Care, Joint mobilization, and Manual therapy  PLAN FOR NEXT SESSION: Begin pulley and PROM   Virgina Organ, PT CLT [phone]  03/30/2023, 9:46 AM

## 2023-04-05 ENCOUNTER — Ambulatory Visit: Payer: 59

## 2023-04-11 ENCOUNTER — Ambulatory Visit (HOSPITAL_COMMUNITY): Payer: 59 | Admitting: Physical Therapy

## 2023-04-11 DIAGNOSIS — M25611 Stiffness of right shoulder, not elsewhere classified: Secondary | ICD-10-CM | POA: Diagnosis not present

## 2023-04-11 DIAGNOSIS — M6281 Muscle weakness (generalized): Secondary | ICD-10-CM

## 2023-04-11 DIAGNOSIS — M25612 Stiffness of left shoulder, not elsewhere classified: Secondary | ICD-10-CM | POA: Diagnosis not present

## 2023-04-11 DIAGNOSIS — M25512 Pain in left shoulder: Secondary | ICD-10-CM

## 2023-04-11 NOTE — Addendum Note (Signed)
Addended by: Bella Kennedy on: 04/11/2023 10:43 AM   Modules accepted: Orders

## 2023-04-11 NOTE — Therapy (Signed)
OUTPATIENT PHYSICAL THERAPY SHOULDER treatment   Patient Name: Christine Mason MRN: [id number] DOB:1971-08-04, 52 y.o., female Today's Date: 04/11/2023  END OF SESSION:  PT End of Session - 04/11/23 0900    Visit Number 2    Number of Visits 12    Date for PT Re-Evaluation 05/11/23    Authorization Type [facility]    PT Start Time [phone]    PT Stop Time 0900    PT Time Calculation (min) 45 min    Activity Tolerance Patient tolerated treatment well    Behavior During Therapy [facility] for tasks assessed/performed             Past Medical History:  Diagnosis Date   Anxiety    Arthritis    Back pain    Carpal tunnel syndrome    bi lat hands   Chronic leg pain    right   Chronic pain of left knee    Depression    Diverticulitis    Fatty liver    GERD (gastroesophageal reflux disease)    Heart murmur    High cholesterol    History of kidney stones    Hypertension    Hypothyroidism    Joint pain    Pre-diabetes    Sleep apnea     no C- Pap   Swallowing difficulty    Thyroid cancer [facility])    Past Surgical History:  Procedure Laterality Date   ACNE CYST REMOVAL     BREAST BIOPSY Right 12/22/2022   CHOLECYSTECTOMY     COLONOSCOPY     DILATION AND CURETTAGE OF UTERUS  1998   ESOPHAGOGASTRODUODENOSCOPY (EGD) WITH PROPOFOL N/A 06/13/2019   Procedure: ESOPHAGOGASTRODUODENOSCOPY (EGD) WITH PROPOFOL;  Surgeon: Christine Hippo, MD;  [facility];  Service: Endoscopy;  Laterality: N/A;  7:30   KNEE SURGERY Left    Meniscus Tear   MALONEY DILATION  06/13/2019   Procedure: MALONEY DILATION;  Surgeon: Christine Hippo, MD;  [facility]: [facility];  Service: Endoscopy;;   MASS EXCISION     RADICAL NECK DISSECTION Left 02/28/2023   Procedure: MODIFIED NECK DISSECTION;  Surgeon: Christine Colonel, MD;  Location: [facility] OR;  Service: ENT;  Laterality: Left;   THORACOTOMY Right 2016   THYROIDECTOMY N/A 07/01/2020   Procedure: TOTAL THYROIDECTOMY WITH Central compartment lymph  node disection;  Surgeon: Christine Level, MD;  Location: [facility];  Service: General;  Laterality: N/A;   Patient Active Problem List   Diagnosis Date Noted   Thyroid cancer (HCC) 02/28/2023   Multiple thyroid nodules 08/24/2022   Hepatic vein stenosis 08/24/2022   Depression 07/11/2022   Other fatigue 06/27/2022   SOBOE (shortness of breath on exertion) 06/27/2022   Osteoarthritis of both knees 06/27/2022   Pre-diabetes 06/27/2022   Other hyperlipidemia 06/27/2022   Depression screening 06/27/2022   Seasonal and perennial allergic rhinitis 03/08/2022   Wheeze 03/08/2022   Gastroesophageal reflux disease 03/08/2022   Hx of papillary thyroid carcinoma 04/14/2021   Status post total thyroidectomy 04/14/2021   Unilateral primary osteoarthritis, left knee 03/24/2021   Chronic pain of left knee 03/24/2021   Morbid obesity (HCC) 02/09/2021   Abnormal liver function tests 02/09/2021   Chest pain 02/09/2021   Chronic low back pain 02/09/2021   Generalized hyperhidrosis 02/09/2021   Major depressive disorder 02/09/2021   Palpitations 02/09/2021   Urinary incontinence 02/09/2021   Impaired fasting glucose 02/01/2021   Essential hypertension 02/01/2021   Mixed hyperlipidemia 02/01/2021   Vitamin  D deficiency 02/01/2021   Hypothyroidism 01/12/2021   Hypokalemia 01/12/2021   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   Neoplasm of uncertain behavior of thyroid gland 06/29/2020   Oropharyngeal dysphagia 05/06/2019   Dysphagia, pharyngoesophageal phase 05/05/2019   Vasomotor symptoms due to menopause 03/13/2019   Genetic testing 06/03/2015   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   [name] states that she was not having any problem with her left arm until she had surgery on her neck surgery on 02/28/23.  After the surgery she noted that she has significant decreased use of her Lt arm.  Hand dominance: Right  PERTINENT HISTORY: 2021 thyroidectomy, radical neck dissection with 26 lymphnodes removed on 02/28/23   PAIN:  Are you having pain? Yes: NPRS scale: 3/10 Pain location: anterior Lt chest went to a cardiologist who stated everything was fine.  Pain description: achy Aggravating factors: constant  Relieving factors: time   PRECAUTIONS: None     WEIGHT BEARING RESTRICTIONS: No  FALLS:  Has patient fallen in last 6 months? No  LIVING ENVIRONMENT: Lives with: lives with their family Lives in: House/apartment OCCUPATION: Nurse tech III mainly sitting   PLOF: Independent  PATIENT GOALS:To be able to use her RT arm again   NEXT MD VISIT: October   OBJECTIVE:   PATIENT SURVEYS:  FOTO 43  COGNITION: Overall cognitive status: Within functional limits for tasks assessed     SENSATION: Per pt she is numb in her face and neck   POSTURE: Rounded shoulder   UPPER EXTREMITY ROM:  supine   Active ROM Right eval Left eval  Shoulder flexion 145 115  Shoulder extension    Shoulder abduction 135 85  Shoulder adduction    Shoulder internal rotation wfl Wfl  Shoulder external rotation 60 50  Elbow flexion    Elbow extension    Wrist flexion    Wrist extension    Wrist ulnar deviation    Wrist radial deviation    Wrist pronation    Wrist supination    (Blank rows = not  tested)  UPPER EXTREMITY MMT:  MMT Right eval Left eval  Shoulder flexion 5 2+  Shoulder extension  3   Shoulder abduction 4 2+  Shoulder adduction    Shoulder internal rotation 5 5  Shoulder external rotation 3/5 3/5  Middle trapezius    Lower trapezius    Elbow flexion    Elbow extension    Wrist flexion    Wrist extension    Wrist ulnar deviation    Wrist radial deviation    Wrist pronation    Wrist supination    Grip strength (lbs)    (Blank rows = not tested)       TODAY'S TREATMENT:                                                                                                                                         DATE: 04/11/23  Pulley Flexion x10 Abduction x 10 Sitting : Wand flexion x 10 Wand abduction x 10 Scapular pro/retraction x 10 ER with 1# x 10  Scapular retraction x 10 Retro shoulder rolls x 10 Cervical side bend B x  3    03/30/23: evaluation    Seated Correct Posture with scapular retraction x  10 reps - 5" hold - Seated Bilateral Shoulder External Rotation with red theraband  10 reps - 5" hold - Supine Shoulder Flexion Extension AAROM with Dowel   10 reps - Supine Shoulder Abduction AAROM with Dowel    PATIENT EDUCATION:             Education details: HEP Person educated: Patient Education method: Chief Technology Officer Education comprehension: verbalized understanding and returned demonstration  HOME EXERCISE PROGRAM: Access Code: [email or web address] URL: [email or web address] Date: 03/30/2023 Prepared by: Virgina Organ  Exercises - Seated Correct Posture  - 5 x daily - 7 x weekly - 1 sets - 10 reps - 5" hold - Seated Scapular Retraction  - 3 x daily - 7 x weekly - 1 sets - 10 reps - 5" hold - Seated Bilateral Shoulder External Rotation with Resistance  - 3 x daily - 7 x weekly - 1 sets - 10 reps - 5" hold - Supine Shoulder Flexion Extension AAROM with Dowel  - 1 x daily - 7 x weekly - 3 sets - 10 reps - Supine Shoulder Abduction AAROM with Dowel  - 1 x daily - 7 x weekly - 3 sets - 10 reps  04/11/23:  Instructed pt in  completing Wand exercises in sitting.    ASSESSMENT:  CLINICAL IMPRESSION:  PT evaluation and goals reviewed with pt. Patient has improved ROM and decreased pain but continues to have limitations in   decreased activity tolerance, decreased strength and increased pain.  Ms. Pinela will continue to benefit from skilled PT to address these issues and return her to previous functioning Mason.   OBJECTIVE IMPAIRMENTS: decreased activity tolerance, decreased strength, and  pain.   ACTIVITY LIMITATIONS: carrying, lifting, and reach over head  PARTICIPATION LIMITATIONS: cleaning  PERSONAL FACTORS: Time since onset of injury/illness/exacerbation and 1 comorbidity: OA  are also affecting patient's functional outcome.   REHAB POTENTIAL: Good  CLINICAL DECISION MAKING: Stable/uncomplicated  EVALUATION COMPLEXITY: Low   GOALS: Goals reviewed with patient? No  SHORT TERM GOALS: Target date: 04/20/23  PT to be I in HEP in order to decrease pain in Lt shoulder to no greater than a 5 Baseline: Goal status: on going   2.  ROM in Lt shoulder to be increased 20 degrees to be able to lift items to shelves that are above shoulder height Goal status: on going   3.   Strength in Lt shoulder to be increased 1/2 grade to be able to be able to lift items to shelves that are above shoulder height. Baseline:  Goal status: on going   LONG TERM GOALS: Target date: 05/11/23  PT to be I in HEP in order to decrease pain in Lt shoulder to no greater than a 1 Baseline:  Goal status: on going   2.  Strength in Lt shoulder to be increased 40 degrees to be able to lift items to shelves that are over head  height. Baseline:  Goal status: on going  3.  Strength in Lt shoulder to be increased 1 grade to be able to lift items to shelves that are over head  height. Baseline:  Goal status: on going   4.  Pt to be able to complete all housework activity Baseline:  Goal status: on going   5.  Pt to be able to  complete all work activity Baseline:  Goal status: on going  PLAN:  PT FREQUENCY: 2x/week  PT DURATION: 6 weeks  PLANNED INTERVENTIONS: Therapeutic exercises, Patient/Family education, Self Care, Joint mobilization, and Manual therapy  PLAN FOR NEXT SESSION: Begin Harlow Mares, PT CLT [phone]  04/11/2023, 9:05 AM

## 2023-04-12 ENCOUNTER — Ambulatory Visit (HOSPITAL_COMMUNITY): Payer: 59 | Admitting: Physical Therapy

## 2023-04-12 DIAGNOSIS — M25512 Pain in left shoulder: Secondary | ICD-10-CM | POA: Diagnosis not present

## 2023-04-12 DIAGNOSIS — M25612 Stiffness of left shoulder, not elsewhere classified: Secondary | ICD-10-CM | POA: Diagnosis not present

## 2023-04-12 DIAGNOSIS — M25611 Stiffness of right shoulder, not elsewhere classified: Secondary | ICD-10-CM | POA: Diagnosis not present

## 2023-04-12 DIAGNOSIS — M6281 Muscle weakness (generalized): Secondary | ICD-10-CM | POA: Diagnosis not present

## 2023-04-12 NOTE — Therapy (Signed)
OUTPATIENT PHYSICAL THERAPY SHOULDER treatment   Patient Name: Christine Mason MRN: [id number] DOB:Feb 06, 1971, 52 y.o., female Today's Date: 04/12/2023  END OF SESSION:  PT End of Session - 04/11/23 0900    Visit Number 2    Number of Visits 12    Date for PT Re-Evaluation 05/11/23    Authorization Type [facility]    PT Start Time [phone]    PT Stop Time 0900    PT Time Calculation (min) 45 min    Activity Tolerance Patient tolerated treatment well    Behavior During Therapy [facility] for tasks assessed/performed             Past Medical History:  Diagnosis Date   Anxiety    Arthritis    Back pain    Carpal tunnel syndrome    bi lat hands   Chronic leg pain    right   Chronic pain of left knee    Depression    Diverticulitis    Fatty liver    GERD (gastroesophageal reflux disease)    Heart murmur    High cholesterol    History of kidney stones    Hypertension    Hypothyroidism    Joint pain    Pre-diabetes    Sleep apnea     no C- Pap   Swallowing difficulty    Thyroid cancer [facility])    Past Surgical History:  Procedure Laterality Date   ACNE CYST REMOVAL     BREAST BIOPSY Right 12/22/2022   CHOLECYSTECTOMY     COLONOSCOPY     DILATION AND CURETTAGE OF UTERUS  1998   ESOPHAGOGASTRODUODENOSCOPY (EGD) WITH PROPOFOL N/A 06/13/2019   Procedure: ESOPHAGOGASTRODUODENOSCOPY (EGD) WITH PROPOFOL;  Surgeon: Malissa Hippo, MD;  [facility];  Service: Endoscopy;  Laterality: N/A;  7:30   KNEE SURGERY Left    Meniscus Tear   MALONEY DILATION  06/13/2019   Procedure: MALONEY DILATION;  Surgeon: Malissa Hippo, MD;  [facility];  Service: Endoscopy;;   MASS EXCISION     RADICAL NECK DISSECTION Left 02/28/2023   Procedure: MODIFIED NECK DISSECTION;  Surgeon: Serena Colonel, MD;  Location: [facility] OR;  Service: ENT;  Laterality: Left;   THORACOTOMY Right 2016   THYROIDECTOMY N/A 07/01/2020   Procedure: TOTAL THYROIDECTOMY WITH Central compartment lymph  node disection;  Surgeon: Darnell Level, MD;  Location: [facility];  Service: General;  Laterality: N/A;   Patient Active Problem List   Diagnosis Date Noted   Thyroid cancer (HCC) 02/28/2023   Multiple thyroid nodules 08/24/2022   Hepatic vein stenosis 08/24/2022   Depression 07/11/2022   Other fatigue 06/27/2022   SOBOE (shortness of breath on exertion) 06/27/2022   Osteoarthritis of both knees 06/27/2022   Pre-diabetes 06/27/2022   Other hyperlipidemia 06/27/2022   Depression screening 06/27/2022   Seasonal and perennial allergic rhinitis 03/08/2022   Wheeze 03/08/2022   Gastroesophageal reflux disease 03/08/2022   Hx of papillary thyroid carcinoma 04/14/2021   Status post total thyroidectomy 04/14/2021   Unilateral primary osteoarthritis, left knee 03/24/2021   Chronic pain of left knee 03/24/2021   Morbid obesity (HCC) 02/09/2021   Abnormal liver function tests 02/09/2021   Chest pain 02/09/2021   Chronic low back pain 02/09/2021   Generalized hyperhidrosis 02/09/2021   Major depressive disorder 02/09/2021   Palpitations 02/09/2021   Urinary incontinence 02/09/2021   Impaired fasting glucose 02/01/2021   Essential hypertension 02/01/2021   Mixed hyperlipidemia 02/01/2021   Vitamin  D deficiency 02/01/2021   Hypothyroidism 01/12/2021   Hypokalemia 01/12/2021   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   Neoplasm of uncertain behavior of thyroid gland 06/29/2020   Oropharyngeal dysphagia 05/06/2019   Dysphagia, pharyngoesophageal phase 05/05/2019   Vasomotor symptoms due to menopause 03/13/2019   Genetic testing 06/03/2015   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   [name] states that she was not having any problem with her left arm until she had surgery on her neck surgery on 02/28/23.  After the surgery she noted that she has significant decreased use of her Lt arm.  Hand dominance: Right  PERTINENT HISTORY: 2021 thyroidectomy, radical neck dissection with 26 lymphnodes removed on 02/28/23   PAIN:  Are you having pain? Yes: NPRS scale: 3/10 Pain location: anterior Lt chest went to a cardiologist who stated everything was fine.  Pain description: achy Aggravating factors: constant  Relieving factors: time   PRECAUTIONS: None     WEIGHT BEARING RESTRICTIONS: No  FALLS:  Has patient fallen in last 6 months? No  LIVING ENVIRONMENT: Lives with: lives with their family Lives in: House/apartment OCCUPATION: Nurse tech III mainly sitting   PLOF: Independent  PATIENT GOALS:To be able to use her RT arm again   NEXT MD VISIT: October   OBJECTIVE:   PATIENT SURVEYS:  FOTO 30  COGNITION: Overall cognitive status: Within functional limits for tasks assessed     SENSATION: Per pt she is numb in her face and neck   POSTURE: Rounded shoulder   UPPER EXTREMITY ROM:  supine   Active ROM Right eval Left eval  Shoulder flexion 145 115  Shoulder extension    Shoulder abduction 135 85  Shoulder adduction    Shoulder internal rotation wfl Wfl  Shoulder external rotation 60 50  Elbow flexion    Elbow extension    Wrist flexion    Wrist extension    Wrist ulnar deviation    Wrist radial deviation    Wrist pronation    Wrist supination    (Blank rows = not  tested)  UPPER EXTREMITY MMT:  MMT Right eval Left eval  Shoulder flexion 5 2+  Shoulder extension  3   Shoulder abduction 4 2+  Shoulder adduction    Shoulder internal rotation 5 5  Shoulder external rotation 3/5 3/5  Middle trapezius    Lower trapezius    Elbow flexion    Elbow extension    Wrist flexion    Wrist extension    Wrist ulnar deviation    Wrist radial deviation    Wrist pronation    Wrist supination    Grip strength (lbs)    (Blank rows = not tested)       TODAY'S TREATMENT:                                                                                                                                         DATE: 04/12/23   Supine: PROM for flexion, abduction and ER Wand for flexion and abduction x 10 ER with 1# x 10 Standing:  isometric exercises  Flexion, abduction and ER x 10 Sitting: pro/retraction x 10 with 1# wand  Scapular retraction x 10 Retro shoulder rolls x 15 Cervical side bend B x  3  04/11/23  Pulley Flexion x10 Abduction x 10 Sitting : Wand flexion x 10 Wand abduction x 10 Scapular pro/retraction x 10 ER with 1# x 10  Scapular retraction x 10 Retro shoulder rolls x 10 Cervical side bend B x  3    03/30/23: evaluation    Seated Correct Posture with scapular retraction x  10 reps - 5" hold - Seated Bilateral Shoulder External Rotation with red theraband  10 reps - 5" hold - Supine Shoulder Flexion Extension AAROM with Dowel   10 reps - Supine Shoulder Abduction AAROM with Dowel    PATIENT EDUCATION:             Education details: HEP Person educated: Patient Education method: Chief Technology Officer Education comprehension: verbalized understanding and returned demonstration  HOME EXERCISE PROGRAM: Access Code: [email or web address] URL: [email or web address] Date: 03/30/2023 Prepared by: Virgina Organ  Exercises - Seated Correct Posture  - 5 x daily - 7 x weekly - 1 sets - 10 reps - 5" hold - Seated Scapular Retraction  - 3 x daily - 7 x weekly - 1 sets - 10 reps - 5" hold - Seated Bilateral Shoulder External  Rotation with Resistance  - 3 x daily - 7 x weekly - 1 sets - 10 reps - 5" hold - Supine Shoulder Flexion Extension AAROM with Dowel  - 1 x daily - 7 x weekly - 3 sets - 10 reps - Supine Shoulder Abduction AAROM with Dowel  - 1 x daily - 7 x weekly - 3 sets - 10 reps  04/11/23:  Instructed pt in completing Wand exercises in sitting.  04/12/23:- Isometric Shoulder Flexion at Wall  - 2 x daily - 7 x weekly - 1 sets - 10 reps - 5" hold - Isometric Shoulder External Rotation at Wall  - 2 x daily - 7 x weekly - 1 sets - 10 reps - 5" hold - Isometric Shoulder Abduction at Wall  - 2 x daily - 7 x weekly - 1 sets - 10 reps - 5" hold  ASSESSMENT:  CLINICAL IMPRESSION:  Therapist attempted sitting AROM  exercises which was  to advanced for pt at this time.  Advanced HEP to isometric exercises.  Pt continues to have limitations in   decreased activity tolerance, decreased strength and increased pain.  Ms. Defelice will continue to benefit from skilled PT to address these issues and return her to previous functioning level.   OBJECTIVE IMPAIRMENTS: decreased activity tolerance, decreased strength, and pain.   ACTIVITY LIMITATIONS: carrying, lifting, and reach over head  PARTICIPATION LIMITATIONS: cleaning  PERSONAL FACTORS: Time since onset of injury/illness/exacerbation and 1 comorbidity: OA  are also affecting patient's functional outcome.   REHAB POTENTIAL: Good  CLINICAL DECISION MAKING: Stable/uncomplicated  EVALUATION COMPLEXITY: Low   GOALS: Goals reviewed with patient? No  SHORT TERM GOALS: Target date: 04/20/23  PT to be I in HEP in order to decrease pain in Lt shoulder to no greater than a 5 Baseline: Goal status: on going   2.  ROM in Lt shoulder to be increased 20 degrees to be able to lift items to shelves that are above shoulder height Goal status: on going   3.   Strength in Lt shoulder to be increased 1/2 grade to be able to be able to lift items to shelves that are  above shoulder height. Baseline:  Goal status: on going   LONG TERM GOALS: Target date: 05/11/23  PT to be I in HEP in order to decrease pain in Lt shoulder to no greater than a 1 Baseline:  Goal status: on going   2.  Strength in Lt shoulder to be increased 40 degrees to be able to lift items to shelves that are over head  height. Baseline:  Goal status: on going  3.  Strength in Lt shoulder to be increased 1 grade to be able to lift items to shelves that are over head  height. Baseline:  Goal status: on going   4.  Pt to be able to complete all housework activity Baseline:  Goal status: on going   5.  Pt to be able to complete all work activity Baseline:  Goal status: on going  PLAN:  PT FREQUENCY: 2x/week  PT DURATION: 6 weeks  PLANNED INTERVENTIONS: Therapeutic exercises, Patient/Family education, Self Care, Joint mobilization, and Manual therapy  PLAN FOR NEXT SESSION: Begin Harlow Mares, PT CLT  [phone]  04/12/2023, 9:47 AM

## 2023-04-17 ENCOUNTER — Other Ambulatory Visit (HOSPITAL_COMMUNITY): Payer: Self-pay

## 2023-04-17 ENCOUNTER — Encounter (HOSPITAL_COMMUNITY): Payer: 59 | Admitting: Physical Therapy

## 2023-04-18 ENCOUNTER — Ambulatory Visit
Admission: RE | Admit: 2023-04-18 | Discharge: 2023-04-18 | Disposition: A | Discharge status: Home / Self Care | Payer: 59 | Source: Ambulatory Visit | Attending: Family Medicine | Admitting: Family Medicine

## 2023-04-18 ENCOUNTER — Ambulatory Visit (INDEPENDENT_AMBULATORY_CARE_PROVIDER_SITE_OTHER): Payer: 59

## 2023-04-18 ENCOUNTER — Ambulatory Visit (HOSPITAL_COMMUNITY): Payer: 59 | Admitting: Physical Therapy

## 2023-04-18 VITALS — BP 118/76 | HR 76 | Temp 97.9°F | Resp 18

## 2023-04-18 DIAGNOSIS — M6281 Muscle weakness (generalized): Secondary | ICD-10-CM

## 2023-04-18 DIAGNOSIS — M25611 Stiffness of right shoulder, not elsewhere classified: Secondary | ICD-10-CM | POA: Diagnosis not present

## 2023-04-18 DIAGNOSIS — M25612 Stiffness of left shoulder, not elsewhere classified: Secondary | ICD-10-CM

## 2023-04-18 DIAGNOSIS — M25512 Pain in left shoulder: Secondary | ICD-10-CM | POA: Diagnosis not present

## 2023-04-18 DIAGNOSIS — M25562 Pain in left knee: Secondary | ICD-10-CM | POA: Diagnosis not present

## 2023-04-18 DIAGNOSIS — M1712 Unilateral primary osteoarthritis, left knee: Secondary | ICD-10-CM

## 2023-04-18 MED ORDER — DEXAMETHASONE SODIUM PHOSPHATE 10 MG/ML IJ SOLN
10.0000 mg | Freq: Once | INTRAMUSCULAR | Status: AC
  Administered 2023-04-18: 10 mg via INTRAMUSCULAR

## 2023-04-18 NOTE — ED Triage Notes (Signed)
Pt reports left knee pain x 2 days.   Having trouble walking on it. Couldn't get in with Ortho.

## 2023-04-18 NOTE — Discharge Instructions (Signed)
Follow-up with your orthopedist regarding neck steps to help manage her pain in the long run.  In the meantime, we will give you a steroid shot and wrapped the knee in an Ace wrap for support.  You may purchase a compression sleeve for the knee if you would like.  Ibuprofen and Tylenol as needed, ice, elevation, low impact exercises.

## 2023-04-18 NOTE — Therapy (Signed)
OUTPATIENT PHYSICAL THERAPY SHOULDER treatment   Patient Name: Christine Mason MRN: [id number] DOB:1971-09-16, 52 y.o., female Today's Date: 04/18/2023  END OF SESSION:   PT End of Session - 04/18/23 0734     Visit Number 4    Number of Visits 12    Date for PT Re-Evaluation 05/11/23    Authorization Type [facility]    PT Start Time [phone]    PT Stop Time 0820    PT Time Calculation (min) 45 min    Activity Tolerance Patient tolerated treatment well    Behavior During Therapy [facility] for tasks assessed/performed                  Past Medical History:  Diagnosis Date   Anxiety    Arthritis    Back pain    Carpal tunnel syndrome    bi lat hands   Chronic leg pain    right   Chronic pain of left knee    Depression    Diverticulitis    Fatty liver    GERD (gastroesophageal reflux disease)    Heart murmur    High cholesterol    History of kidney stones    Hypertension    Hypothyroidism    Joint pain    Pre-diabetes    Sleep apnea     no C- Pap   Swallowing difficulty    Thyroid cancer [facility])    Past Surgical History:  Procedure Laterality Date   ACNE CYST REMOVAL     BREAST BIOPSY Right 12/22/2022   CHOLECYSTECTOMY     COLONOSCOPY     DILATION AND CURETTAGE OF UTERUS  1998   ESOPHAGOGASTRODUODENOSCOPY (EGD) WITH PROPOFOL N/A 06/13/2019   Procedure: ESOPHAGOGASTRODUODENOSCOPY (EGD) WITH PROPOFOL;  Surgeon: Malissa Hippo, MD;  [facility];  Service: Endoscopy;  Laterality: N/A;  7:30   KNEE SURGERY Left    Meniscus Tear   MALONEY DILATION  06/13/2019   Procedure: MALONEY DILATION;  Surgeon: Malissa Hippo, MD;  Location: [facility];  Service: Endoscopy;;   MASS EXCISION     RADICAL NECK DISSECTION Left 02/28/2023   Procedure: MODIFIED NECK DISSECTION;  Surgeon: Serena Colonel, MD;  Location: [facility] OR;  Service: ENT;  Laterality: Left;   THORACOTOMY Right 2016   THYROIDECTOMY N/A 07/01/2020   Procedure: TOTAL THYROIDECTOMY WITH Central  compartment lymph node disection;  Surgeon: Darnell Level, MD;  Location: [facility];  Service: General;  Laterality: N/A;   Patient Active Problem List   Diagnosis Date Noted   Thyroid cancer (HCC) 02/28/2023   Multiple thyroid nodules 08/24/2022   Hepatic vein stenosis 08/24/2022   Depression 07/11/2022   Other fatigue 06/27/2022   SOBOE (shortness of breath on exertion) 06/27/2022   Osteoarthritis of both knees 06/27/2022   Pre-diabetes 06/27/2022   Other hyperlipidemia 06/27/2022   Depression screening 06/27/2022   Seasonal and perennial allergic rhinitis 03/08/2022   Wheeze 03/08/2022   Gastroesophageal reflux disease 03/08/2022   Hx of papillary thyroid carcinoma 04/14/2021   Status post total thyroidectomy 04/14/2021   Unilateral primary osteoarthritis, left knee 03/24/2021   Chronic pain of left knee 03/24/2021   Morbid obesity (HCC) 02/09/2021   Abnormal liver function tests 02/09/2021   Chest pain 02/09/2021   Chronic low back pain 02/09/2021   Generalized hyperhidrosis 02/09/2021   Major depressive disorder 02/09/2021   Palpitations 02/09/2021   Urinary incontinence 02/09/2021   Impaired fasting glucose 02/01/2021   Essential hypertension 02/01/2021  Mixed hyperlipidemia 02/01/2021   Vitamin D deficiency 02/01/2021   Hypothyroidism 01/12/2021   Hypokalemia 01/12/2021   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   Neoplasm of uncertain behavior of thyroid gland 06/29/2020   Oropharyngeal dysphagia 05/06/2019   Dysphagia, pharyngoesophageal phase 05/05/2019   Vasomotor symptoms due to menopause 03/13/2019   Genetic testing 06/03/2015   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  Can tell her shoulder is getting better.  Pain at times top of Lt shoulder and across her chest.  Currently without pain.   Christine Mason states that she was not having any problem with her left arm until she had surgery on her neck surgery on 02/28/23.  After the surgery she noted that she has significant decreased use of her Lt arm.  Hand dominance: Right  PERTINENT HISTORY: 2021 thyroidectomy, radical neck dissection with 26 lymphnodes removed on 02/28/23; needs a Lt TKA   PAIN:  Are you having pain? Yes: NPRS scale: 3/10 Pain location: anterior Lt chest went to a cardiologist who stated everything was fine.  Pain description: achy Aggravating factors: constant  Relieving factors: time   PRECAUTIONS: None     WEIGHT BEARING RESTRICTIONS: No  FALLS:  Has patient fallen in last 6 months? No  LIVING ENVIRONMENT: Lives with: lives with their family Lives in: House/apartment OCCUPATION: Nurse tech III mainly sitting   PLOF: Independent  PATIENT GOALS:To be able to use her RT arm again   NEXT MD VISIT: October   OBJECTIVE:   PATIENT SURVEYS:  FOTO 51  COGNITION: Overall cognitive status: Within functional limits for tasks assessed     SENSATION: Per pt she is numb in her face and neck   POSTURE: Rounded shoulder   UPPER EXTREMITY ROM:  supine   Active ROM Right eval Left eval Left AAROM 04/18/23  Shoulder flexion 145 115 150  Shoulder extension     Shoulder abduction 135 85 125  Shoulder adduction     Shoulder internal rotation wfl Wfl  Shoulder external rotation 60 50 70  Elbow flexion     Elbow extension      Wrist flexion     Wrist extension     Wrist ulnar deviation     Wrist radial deviation     Wrist pronation     Wrist supination     (Blank rows = not tested)  UPPER EXTREMITY MMT:  MMT Right eval Left eval  Shoulder flexion 5 2+  Shoulder extension  3  Shoulder abduction 4 2+  Shoulder adduction    Shoulder internal rotation 5 5  Shoulder external rotation 3/5 3/5  Middle trapezius    Lower trapezius    Elbow flexion    Elbow extension    Wrist flexion    Wrist extension    Wrist ulnar deviation    Wrist radial deviation    Wrist pronation    Wrist supination    Grip strength (lbs)    (Blank rows = not tested)       TODAY'S TREATMENT:                                                                                                                                         DATE: 04/18/23 Supine: 1# wand for flexion and abduction 10X each  ER with 1# 10X PROM for flexion, abduction and ER Standing: isometric exercises Lt UE Flexion, abduction and ER x 10 Sitting: pro/retraction x 10 with 1# wand   Bil UE flexion 10X  Bil UE abduction (jumping jacks) 10X  Bil UE "flasher"  ER 10X UBE 4' backward level 1  04/12/23  Supine: PROM for flexion, abduction and ER Wand for flexion and abduction x 10 ER with 1# x 10 Standing:  isometric exercises  Flexion, abduction and ER x 10 Sitting: pro/retraction x 10 with 1# wand  Scapular retraction x 10 Retro shoulder rolls x 15 Cervical side bend B x  3  04/11/23  Pulley Flexion x10 Abduction x 10 Sitting : Wand flexion x 10 Wand abduction x 10 Scapular pro/retraction x 10 ER with 1# x 10  Scapular retraction x 10 Retro shoulder rolls x 10 Cervical side bend B x  3    03/30/23: evaluation    Seated Correct Posture with scapular retraction x  10 reps - 5" hold - Seated Bilateral Shoulder External Rotation with red theraband  10 reps - 5" hold - Supine Shoulder Flexion Extension AAROM with Dowel   10 reps -  Supine Shoulder Abduction AAROM with Dowel    PATIENT EDUCATION:             Education details: HEP Person educated: Patient Education method: Chief Technology Officer Education comprehension: verbalized understanding and returned demonstration  HOME EXERCISE PROGRAM: Access Code: [email or web address] URL: [email or web address] Date: 03/30/2023 Prepared by: Virgina Organ  Exercises - Seated Correct Posture  - 5 x daily - 7 x  weekly - 1 sets - 10 reps - 5" hold - Seated Scapular Retraction  - 3 x daily - 7 x weekly - 1 sets - 10 reps - 5" hold - Seated Bilateral Shoulder External Rotation with Resistance  - 3 x daily - 7 x weekly - 1 sets - 10 reps - 5" hold - Supine Shoulder Flexion Extension AAROM with Dowel  - 1 x daily - 7 x weekly - 3 sets - 10 reps - Supine Shoulder Abduction AAROM with Dowel  - 1 x daily - 7 x weekly - 3 sets - 10 reps  04/11/23:  Instructed pt in completing Wand exercises in sitting.              04/12/23:- Isometric Shoulder Flexion at Wall  - 2 x daily - 7 x weekly - 1 sets - 10 reps - 5" hold - Isometric Shoulder External Rotation at Wall  - 2 x daily - 7 x weekly - 1 sets - 10 reps - 5" hold - Isometric Shoulder Abduction at Wall  - 2 x daily - 7 x weekly - 1 sets - 10 reps - 5" hold  ASSESSMENT:  CLINICAL IMPRESSION:   Began session with AAROM with 1# wand.  Much improved A/AROM in Lt UE with new measurements recorded in above chart.  Progressed with seated Lt UE strengthening with cues for form, stabilization and maintenance of good posturing. Pt able to achieve approx half ROM with each activity.  Completed UBE at EOS to work on activity tolerance.  Educated on scar tape and instructed to massage scar to reduce tightness with demonstration on direction. Pt will continue to benefit from skilled PT to address these issues and return her to previous functioning level.   OBJECTIVE IMPAIRMENTS: decreased activity tolerance, decreased strength, and pain.    ACTIVITY LIMITATIONS: carrying, lifting, and reach over head  PARTICIPATION LIMITATIONS: cleaning  PERSONAL FACTORS: Time since onset of injury/illness/exacerbation and 1 comorbidity: OA  are also affecting patient's functional outcome.   REHAB POTENTIAL: Good  CLINICAL DECISION MAKING: Stable/uncomplicated  EVALUATION COMPLEXITY: Low   GOALS: Goals reviewed with patient? No  SHORT TERM GOALS: Target date: 04/20/23  PT to be I in HEP in order to decrease pain in Lt shoulder to no greater than a 5 Baseline: Goal status: on going   2.  ROM in Lt shoulder to be increased 20 degrees to be able to lift items to shelves that are above shoulder height Goal status: on going   3.   Strength in Lt shoulder to be increased 1/2 grade to be able to be able to lift items to shelves that are above shoulder height. Baseline:  Goal status: on going   LONG TERM GOALS: Target date: 05/11/23  PT to be I in HEP in order to decrease pain in Lt shoulder to no greater than a 1 Baseline:  Goal status: on going   2.  Strength in Lt shoulder to be increased 40 degrees to be able to lift items to shelves that are over head  height. Baseline:  Goal status: on going  3.  Strength in Lt shoulder to be increased 1 grade to be able to lift items to shelves that are over head  height. Baseline:  Goal status: on going   4.  Pt to be able to complete all housework activity Baseline:  Goal status: on going   5.  Pt to be able to complete all work activity Baseline:  Goal status: on going  PLAN:  PT FREQUENCY: 2x/week  PT DURATION: 6 weeks  PLANNED INTERVENTIONS: Therapeutic exercises, Patient/Family education, Self Care, Joint mobilization, and Manual therapy  PLAN FOR NEXT SESSION: Progress Active/PROM   Lurena Nida, PTA/CLT [facility] Ph: [phone]  04/18/2023, 8:55 AM

## 2023-04-18 NOTE — ED Provider Notes (Signed)
[facility]    CSN: [id number] Arrival date & time: 04/18/23  4098      History   Chief Complaint No chief complaint on file.   HPI Christine Mason is a 52 y.o. female.   Patient presenting today with 2-day history of significant acute on chronic left anterior knee pain.  States pain started after working 4 days in a row, states after work shift yesterday she could barely make it out to her car due to the pain.  Does have a known history of arthritis in this knee, wanting another x-ray to see if things have worsened.  Denies edema, discoloration, numbness, tingling, loss of range of motion, direct injury to the area.  So far trying over-the-counter pain relievers with no relief.  Has an orthopedist following this but could not get in with them today.  States she typically gets cortisone injections in the knee.    Past Medical History:  Diagnosis Date   Anxiety    Arthritis    Back pain    Carpal tunnel syndrome    bi lat hands   Chronic leg pain    right   Chronic pain of left knee    Depression    Diverticulitis    Fatty liver    GERD (gastroesophageal reflux disease)    Heart murmur    High cholesterol    History of kidney stones    Hypertension    Hypothyroidism    Joint pain    Pre-diabetes    Sleep apnea     no C- Pap   Swallowing difficulty    Thyroid cancer [facility])     Patient Active Problem List   Diagnosis Date Noted   Thyroid cancer (HCC) 02/28/2023   Multiple thyroid nodules 08/24/2022   Hepatic vein stenosis 08/24/2022   Depression 07/11/2022   Other fatigue 06/27/2022   SOBOE (shortness of breath on exertion) 06/27/2022   Osteoarthritis of both knees 06/27/2022   Pre-diabetes 06/27/2022   Other hyperlipidemia 06/27/2022   Depression screening 06/27/2022   Seasonal and perennial allergic rhinitis 03/08/2022   Wheeze 03/08/2022   Gastroesophageal reflux disease 03/08/2022   Hx of papillary thyroid carcinoma 04/14/2021   Status post  total thyroidectomy 04/14/2021   Unilateral primary osteoarthritis, left knee 03/24/2021   Chronic pain of left knee 03/24/2021   Morbid obesity (HCC) 02/09/2021   Abnormal liver function tests 02/09/2021   Chest pain 02/09/2021   Chronic low back pain 02/09/2021   Generalized hyperhidrosis 02/09/2021   Major depressive disorder 02/09/2021   Palpitations 02/09/2021   Urinary incontinence 02/09/2021   Impaired fasting glucose 02/01/2021   Essential hypertension 02/01/2021   Mixed hyperlipidemia 02/01/2021   Vitamin D deficiency 02/01/2021   Hypothyroidism 01/12/2021   Hypokalemia 01/12/2021   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   Neoplasm of uncertain behavior of thyroid gland 06/29/2020   Oropharyngeal dysphagia 05/06/2019   Dysphagia, pharyngoesophageal phase 05/05/2019   Vasomotor symptoms due to menopause 03/13/2019   Genetic testing 06/03/2015   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    Past Surgical History:  Procedure Laterality Date   ACNE CYST REMOVAL     BREAST BIOPSY Right 12/22/2022   CHOLECYSTECTOMY     COLONOSCOPY     DILATION AND CURETTAGE OF UTERUS  1998   ESOPHAGOGASTRODUODENOSCOPY (EGD) WITH PROPOFOL N/A 06/13/2019   Procedure: ESOPHAGOGASTRODUODENOSCOPY (EGD) WITH PROPOFOL;  Surgeon: Malissa Hippo, MD;  [facility];  Service: Endoscopy;  Laterality:  N/A;  7:30   KNEE SURGERY Left    Meniscus Tear   MALONEY DILATION  06/13/2019   Procedure: MALONEY DILATION;  Surgeon: Malissa Hippo, MD;  [facility];  Service: Endoscopy;;   MASS EXCISION     RADICAL NECK DISSECTION Left 02/28/2023   Procedure: MODIFIED NECK DISSECTION;  Surgeon: Serena Colonel, MD;  Location: [facility] OR;  Service: ENT;  Laterality: Left;   THORACOTOMY Right 2016   THYROIDECTOMY N/A 07/01/2020   Procedure: TOTAL THYROIDECTOMY WITH Central compartment lymph node disection;  Surgeon: Darnell Level, MD;  Location: [facility];  Service:  General;  Laterality: N/A;    OB History     Gravida  1   Para      Term      Preterm      AB  1   Living  0      SAB  1   IAB      Ectopic      Multiple      Live Births               Home Medications    Prior to Admission medications   Medication Sig Start Date End Date Taking? Authorizing Provider  atenolol (TENORMIN) 50 MG tablet Take 1 tablet (50 mg total) by mouth daily. Patient taking differently: Take 50 mg by mouth at bedtime. 04/19/18   Serena Croissant, MD  bacitracin ointment Apply topically every 8 (eight) hours. 03/03/23   Scarlette Ar, MD  Cholecalciferol (VITAMIN D3) 50 MCG (2000 UT) TABS Take 2,000 Units by mouth daily.    [provider]  desvenlafaxine (PRISTIQ) 100 MG 24 hr tablet Take 1 tablet (100 mg total) by mouth daily. 05/02/22   Serena Croissant, MD  EPINEPHrine 0.3 mg/0.3 mL IJ SOAJ injection Inject 0.3 mg into the muscle as needed for anaphylaxis. 03/08/22   Ambs, Norvel Richards, FNP  levocetirizine (XYZAL) 5 MG tablet Take 1 tablet (5 mg total) by mouth daily. 03/03/23   Scarlette Ar, MD  levothyroxine (SYNTHROID) 200 MCG tablet Take 1 tablet (200 mcg total) by mouth daily. 07/17/22   Shamleffer, Konrad Dolores, MD  levothyroxine (SYNTHROID) 50 MCG tablet Take 1 tablet (50 mcg total) by mouth daily. To be taken WITH levothyroxine 200 mcg daily 07/14/22   Shamleffer, Konrad Dolores, MD  LORazepam (ATIVAN) 1 MG tablet Take 1 mg by mouth at bedtime.  04/02/20   [provider]  losartan (COZAAR) 100 MG tablet Take 100 mg by mouth daily.  12/09/18   [provider]  Multiple Vitamin (MULTIVITAMIN) tablet Take 2 tablets by mouth daily. gummy    [provider]  NEURONTIN 300 MG capsule TAKE 1 CAPSULE BY MOUTH ONCE A DAY AT BEDTIME 01/09/23   Vickki Hearing, MD  NON FORMULARY Pt uses a cpap nightly    [provider]  ondansetron (ZOFRAN-ODT) 4 MG disintegrating tablet Take 1 tablet (4 mg total) by mouth  every 8 (eight) hours as needed for nausea or vomiting. 02/16/23   Rodriguez-Southworth, Nettie Elm, PA-C  oxybutynin (DITROPAN-XL) 10 MG 24 hr tablet Take 10 mg by mouth 2 (two) times daily.    [provider]  pantoprazole (PROTONIX) 40 MG tablet Take 40 mg by mouth 2 (two) times daily.    [provider]  rosuvastatin (CRESTOR) 10 MG tablet Take 10 mg by mouth daily. 11/09/20   [provider]  spironolactone-hydrochlorothiazide (ALDACTAZIDE) 25-25 MG tablet Take 1 tablet by mouth every  morning. 03/21/23   Yates Decamp, MD  tiZANidine (ZANAFLEX) 4 MG tablet Take 1 tablet (4 mg total) by mouth every 8 (eight) hours as needed for 10 days. 02/17/22     traZODone (DESYREL) 50 MG tablet Take 1 tablet (50 mg total) by mouth at bedtime. 09/26/22 09/27/22      Family History Family History  Problem Relation Age of Onset   Sleep apnea Mother    Cancer Mother    Hyperlipidemia Mother    Diabetes Mother    Hypertension Mother    Breast cancer Mother 60       dx again at 55; PALB2 and CHEK2 positive   Depression Mother    Obesity Mother    Stroke Father    Depression Father    Obesity Father    Hyperlipidemia Father    Diabetes Father    Hypertension Father    Coronary artery disease Father    Sleep apnea Father    Heart disease Father    Hypertension Sister    Breast cancer Sister        diagnosed in her 68's   Lung cancer Maternal Aunt 65   Thyroid cancer Maternal Uncle 46       medullary   Lymphoma Maternal Uncle 60       follicular lymphoma   Heart attack Maternal Uncle    Lung cancer Maternal Grandmother    Leukemia Maternal Grandfather 42       dx with hairy cell leukemia at 76; CLL at 76   Lymphoma Maternal Grandfather 10       hodgkins lymphoma   Brain cancer Paternal Grandmother    Prostate cancer Paternal Grandfather    Allergic rhinitis Neg Hx    Asthma Neg Hx     Social History Social History   Tobacco Use   Smoking status: Former    Current  packs/day: 0.00    Average packs/day: 0.5 packs/day for 5.0 years (2.5 ttl pk-yrs)    Types: Cigarettes    Start date: 09/15/2012    Quit date: 09/15/2017    Years since quitting: 5.5   Smokeless tobacco: Never  Vaping Use   Vaping status: Never Used  Substance Use Topics   Alcohol use: No   Drug use: No     Allergies   Penicillins   Review of Systems Review of Systems Per HPI  Physical Exam Triage Vital Signs ED Triage Vitals [04/18/23 0843]  Encounter Vitals Group     BP 118/76     Systolic BP Percentile      Diastolic BP Percentile      Pulse Rate 76     Resp 18     Temp 97.9 F (36.6 C)     Temp Source Oral     SpO2 94 %     Weight      Height      Head Circumference      Peak Flow      Pain Score 6     Pain Loc      Pain Education      Exclude from Growth Chart    No data found.  Updated Vital Signs BP 118/76 (BP Location: Right Wrist)   Pulse 76   Temp 97.9 F (36.6 C) (Oral)   Resp 18   LMP 05/22/2015   SpO2 94%   Visual Acuity Right Eye Distance:   Left Eye Distance:   Bilateral Distance:    Right Eye  Near:   Left Eye Near:    Bilateral Near:     Physical Exam Vitals and nursing note reviewed.  Constitutional:      Appearance: Normal appearance. She is obese. She is not ill-appearing.  HENT:     Head: Atraumatic.  Eyes:     Extraocular Movements: Extraocular movements intact.     Conjunctiva/sclera: Conjunctivae normal.  Cardiovascular:     Rate and Rhythm: Normal rate and regular rhythm.     Heart sounds: Normal heart sounds.  Pulmonary:     Effort: Pulmonary effort is normal.     Breath sounds: Normal breath sounds.  Musculoskeletal:        General: Tenderness present. No swelling, deformity or signs of injury. Normal range of motion.     Cervical back: Normal range of motion and neck supple.     Comments: Left knee tender to palpation anteriorly and bilateral joint lines.  Negative drawer testing, negative McMurray's, no  joint instability.  Range of motion intact but painful  Skin:    General: Skin is warm and dry.     Findings: No erythema.  Neurological:     Mental Status: She is alert and oriented to person, place, and time.     Comments: Left lower extremity neurovascularly intact  Psychiatric:        Mood and Affect: Mood normal.        Thought Content: Thought content normal.        Judgment: Judgment normal.      UC Treatments / Results  Labs (all labs ordered are listed, but only abnormal results are displayed) Labs Reviewed - No data to display  EKG   Radiology DG Knee Complete 4 Views Left  Result Date: 04/18/2023 CLINICAL DATA:  2 days of left knee pain.  No known injury. EXAM: LEFT KNEE - COMPLETE 4+ VIEW COMPARISON:  Left knee radiographs 02/07/2021 FINDINGS: Moderate medial compartment joint space narrowing and peripheral osteophytosis. Mild peripheral lateral quadrant degenerative osteophytosis without significant joint space narrowing. Moderate to severe patellofemoral joint space narrowing and peripheral osteophytosis. No joint effusion. No acute fracture or dislocation. IMPRESSION: Moderate medial compartment and moderate to severe patellofemoral compartment osteoarthritis. Electronically Signed   By: Neita Garnet M.D.   On: 04/18/2023 09:59    Procedures Procedures (including critical care time)  Medications Ordered in UC Medications  dexamethasone (DECADRON) injection 10 mg (10 mg Intramuscular Given 04/18/23 1017)    Initial Impression / Assessment and Plan / UC Course  I have reviewed the triage vital signs and the nursing notes.  Pertinent labs & imaging results that were available during my care of the patient were reviewed by me and considered in my medical decision making (see chart for details).     X-ray showing moderate to severe osteoarthritis to the left knee.  Will treat with IM Decadron, over-the-counter anti-inflammatories going forward as needed,  compression sleeve, RICE, Ortho follow-up for ongoing management  Final Clinical Impressions(s) / UC Diagnoses   Final diagnoses:  Primary osteoarthritis of left knee     Discharge Instructions      Follow-up with your orthopedist regarding neck steps to help manage her pain in the long run.  In the meantime, we will give you a steroid shot and wrapped the knee in an Ace wrap for support.  You may purchase a compression sleeve for the knee if you would like.  Ibuprofen and Tylenol as needed, ice, elevation, low impact exercises.  ED Prescriptions   None    PDMP not reviewed this encounter.   Particia Nearing, [place] 04/18/23 1117

## 2023-04-20 ENCOUNTER — Ambulatory Visit (HOSPITAL_COMMUNITY): Payer: 59 | Attending: Otolaryngology | Admitting: Physical Therapy

## 2023-04-20 DIAGNOSIS — M25512 Pain in left shoulder: Secondary | ICD-10-CM | POA: Insufficient documentation

## 2023-04-20 DIAGNOSIS — M6281 Muscle weakness (generalized): Secondary | ICD-10-CM | POA: Insufficient documentation

## 2023-04-20 DIAGNOSIS — M25612 Stiffness of left shoulder, not elsewhere classified: Secondary | ICD-10-CM | POA: Insufficient documentation

## 2023-04-20 DIAGNOSIS — M25611 Stiffness of right shoulder, not elsewhere classified: Secondary | ICD-10-CM | POA: Insufficient documentation

## 2023-04-20 NOTE — Therapy (Signed)
OUTPATIENT PHYSICAL THERAPY SHOULDER treatment   Patient Name: Christine Mason MRN: [id number] DOB:[birth year], 52 y.o., female Today's Date: 04/20/2023  END OF SESSION:   PT End of Session - 04/20/23 0957     Visit Number 5    Number of Visits 12    Date for PT Re-Evaluation 05/11/23    Authorization Type [facility]    PT Start Time [phone]    PT Stop Time 1028    PT Time Calculation (min) 40 min    Activity Tolerance Patient tolerated treatment well    Behavior During Therapy [facility] for tasks assessed/performed                  Past Medical History:  Diagnosis Date   Anxiety    Arthritis    Back pain    Carpal tunnel syndrome    bi lat hands   Chronic leg pain    right   Chronic pain of left knee    Depression    Diverticulitis    Fatty liver    GERD (gastroesophageal reflux disease)    Heart murmur    High cholesterol    History of kidney stones    Hypertension    Hypothyroidism    Joint pain    Pre-diabetes    Sleep apnea     no C- Pap   Swallowing difficulty    Thyroid cancer [facility])    Past Surgical History:  Procedure Laterality Date   ACNE CYST REMOVAL     BREAST BIOPSY Right 12/22/2022   CHOLECYSTECTOMY     COLONOSCOPY     DILATION AND CURETTAGE OF UTERUS  1998   ESOPHAGOGASTRODUODENOSCOPY (EGD) WITH PROPOFOL N/A 06/13/2019   Procedure: ESOPHAGOGASTRODUODENOSCOPY (EGD) WITH PROPOFOL;  Surgeon: Malissa Hippo, MD;  [facility];  Service: Endoscopy;  Laterality: N/A;  7:30   KNEE SURGERY Left    Meniscus Tear   MALONEY DILATION  06/13/2019   Procedure: MALONEY DILATION;  Surgeon: Malissa Hippo, MD;  [facility];  Service: Endoscopy;;   MASS EXCISION     RADICAL NECK DISSECTION Left 02/28/2023   Procedure: MODIFIED NECK DISSECTION;  Surgeon: Serena Colonel, MD;  Location: [facility] OR;  Service: ENT;  Laterality: Left;   THORACOTOMY Right 2016   THYROIDECTOMY N/A 07/01/2020   Procedure: TOTAL THYROIDECTOMY WITH Central  compartment lymph node disection;  Surgeon: Darnell Level, MD;  Location: [facility];  Service: General;  Laterality: N/A;   Patient Active Problem List   Diagnosis Date Noted   Thyroid cancer (HCC) 02/28/2023   Multiple thyroid nodules 08/24/2022   Hepatic vein stenosis 08/24/2022   Depression 07/11/2022   Other fatigue 06/27/2022   SOBOE (shortness of breath on exertion) 06/27/2022   Osteoarthritis of both knees 06/27/2022   Pre-diabetes 06/27/2022   Other hyperlipidemia 06/27/2022   Depression screening 06/27/2022   Seasonal and perennial allergic rhinitis 03/08/2022   Wheeze 03/08/2022   Gastroesophageal reflux disease 03/08/2022   Hx of papillary thyroid carcinoma 04/14/2021   Status post total thyroidectomy 04/14/2021   Unilateral primary osteoarthritis, left knee 03/24/2021   Chronic pain of left knee 03/24/2021   Morbid obesity (HCC) 02/09/2021   Abnormal liver function tests 02/09/2021   Chest pain 02/09/2021   Chronic low back pain 02/09/2021   Generalized hyperhidrosis 02/09/2021   Major depressive disorder 02/09/2021   Palpitations 02/09/2021   Urinary incontinence 02/09/2021   Impaired fasting glucose 02/01/2021   Essential hypertension 02/01/2021  Mixed hyperlipidemia 02/01/2021   Vitamin D deficiency 02/01/2021   Hypothyroidism 01/12/2021   Hypokalemia 01/12/2021   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   Neoplasm of uncertain behavior of thyroid gland 06/29/2020   Oropharyngeal dysphagia 05/06/2019   Dysphagia, pharyngoesophageal phase 05/05/2019   Vasomotor symptoms due to menopause 03/13/2019   Genetic testing 06/03/2015   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  Only has pain in very end ROM.   Ms. Corredor states that she was not having any problem with her left arm until she had surgery on her neck surgery on 02/28/23.  After the surgery she noted that she has significant decreased use of her Lt arm.  Hand dominance: Right  PERTINENT HISTORY: 2021 thyroidectomy, radical neck dissection with 26 lymphnodes removed on 02/28/23; needs a Lt TKA   PAIN:  Are you having pain? Yes: NPRS scale: 3/10 Pain location: anterior Lt chest went to a cardiologist who stated everything was fine.  Pain description: achy Aggravating factors: constant  Relieving factors: time   PRECAUTIONS: None     WEIGHT BEARING RESTRICTIONS: No  FALLS:  Has patient fallen in last 6 months? No  LIVING ENVIRONMENT: Lives with: lives with their family Lives in: House/apartment OCCUPATION: Nurse tech III mainly sitting   PLOF: Independent  PATIENT GOALS:To be able to use her RT arm again   NEXT MD VISIT: October   OBJECTIVE:   PATIENT SURVEYS:  FOTO 44  COGNITION: Overall cognitive status: Within functional limits for tasks assessed     SENSATION: Per pt she is numb in her face and neck   POSTURE: Rounded shoulder   UPPER EXTREMITY ROM:  supine   Active ROM Right eval Left eval Left AAROM 04/18/23  Shoulder flexion 145 115 150  Shoulder extension     Shoulder abduction 135 85 125  Shoulder adduction     Shoulder internal rotation wfl Wfl   Shoulder external rotation 60 50 70  Elbow flexion     Elbow extension     Wrist flexion     Wrist extension  Wrist ulnar deviation     Wrist radial deviation     Wrist pronation     Wrist supination     (Blank rows = not  tested)  UPPER EXTREMITY MMT:  MMT Right eval Left eval  Shoulder flexion 5 2+  Shoulder extension  3  Shoulder abduction 4 2+  Shoulder adduction    Shoulder internal rotation 5 5  Shoulder external rotation 3/5 3/5  Middle trapezius    Lower trapezius    Elbow flexion    Elbow extension    Wrist flexion    Wrist extension    Wrist ulnar deviation    Wrist radial deviation    Wrist pronation    Wrist supination    Grip strength (lbs)    (Blank rows = not tested)       TODAY'S TREATMENT:                                                                                                                                         DATE: 04/19/23 UBE 4' backward level 1 Pulleys flexion/abduction 20X each Sitting:Bil UE flexion 10X  Bil UE abduction (jumping jacks) 10X  Bil UE "flasher"  ER 10X Standing UE flexion (only 90 degrees achieved Lt) Supine: 1# wand for flexion and abduction 10X each  ER with 1# 10X PROM for flexion, abduction and ER  04/18/23 Supine: 1# wand for flexion and abduction 10X each  ER with 1# 10X PROM for flexion, abduction and ER Standing: isometric exercises Lt UE Flexion, abduction and ER x 10 Sitting: pro/retraction x 10 with 1# wand   Bil UE flexion 10X  Bil UE abduction (jumping jacks) 10X  Bil UE "flasher"  ER 10X UBE 4' backward level 1  04/12/23  Supine: PROM for flexion, abduction and ER Wand for flexion and abduction x 10 ER with 1# x 10 Standing:  isometric exercises  Flexion, abduction and ER x 10 Sitting: pro/retraction x 10 with 1# wand  Scapular retraction x 10 Retro shoulder rolls x 15 Cervical side bend B x  3  04/11/23  Pulley Flexion x10 Abduction x 10 Sitting : Wand flexion x 10 Wand abduction x 10 Scapular pro/retraction x 10 ER with 1# x 10  Scapular retraction x 10 Retro shoulder rolls x 10 Cervical side bend B x  3    03/30/23: evaluation    Seated Correct Posture with scapular retraction x  10 reps - 5"  hold - Seated Bilateral Shoulder External Rotation with red theraband  10 reps - 5" hold - Supine Shoulder Flexion Extension AAROM with Dowel   10 reps - Supine Shoulder Abduction AAROM with Dowel    PATIENT EDUCATION:             Education details: HEP Person educated: Patient Education method: Explanation and Handouts Education comprehension: verbalized understanding and returned demonstration  HOME EXERCISE  PROGRAM: Access Code: [email or web address] URL: [email or web address] Date: 03/30/2023 Prepared by: Virgina Organ  Exercises - Seated Correct Posture  - 5 x daily - 7 x weekly - 1 sets - 10 reps - 5" hold - Seated Scapular Retraction  - 3 x daily - 7 x weekly - 1 sets - 10 reps - 5" hold - Seated Bilateral Shoulder External Rotation with Resistance  - 3 x daily - 7 x weekly - 1 sets - 10 reps - 5" hold - Supine Shoulder Flexion Extension AAROM with Dowel  - 1 x daily - 7 x weekly - 3 sets - 10 reps - Supine Shoulder Abduction AAROM with Dowel  - 1 x daily - 7 x weekly - 3 sets - 10 reps  04/11/23:  Instructed pt in completing Wand exercises in sitting.              04/12/23:- Isometric Shoulder Flexion at Wall  - 2 x daily - 7 x weekly - 1 sets - 10 reps - 5" hold - Isometric Shoulder External Rotation at Wall  - 2 x daily - 7 x weekly - 1 sets - 10 reps - 5" hold - Isometric Shoulder Abduction at Wall  - 2 x daily - 7 x weekly - 1 sets - 10 reps - 5" hold  ASSESSMENT:  CLINICAL IMPRESSION:   Began session with UBE today for dynamic warmup and progressed to pulleys for AAROM.  Near full ROM achieved with this.  Focused on seated AROM today with much improved ROM/strength with all directions.  Approx 110 degrees abduction and 140 degrees achieved in Lt UE with seated exercises.  Standing flexion more restricted with max AROM of 90 degrees Lt UE. Completed with supine AAROM/PROM. Near full ROM in all planes with PROM.   PT continues to improve and make gains.  Pt will  continue to benefit from skilled PT to address these issues and return her to previous functioning level.   OBJECTIVE IMPAIRMENTS: decreased activity tolerance, decreased strength, and pain.   ACTIVITY LIMITATIONS: carrying, lifting, and reach over head  PARTICIPATION LIMITATIONS: cleaning  PERSONAL FACTORS: Time since onset of injury/illness/exacerbation and 1 comorbidity: OA  are also affecting patient's functional outcome.   REHAB POTENTIAL: Good  CLINICAL DECISION MAKING: Stable/uncomplicated  EVALUATION COMPLEXITY: Low   GOALS: Goals reviewed with patient? No  SHORT TERM GOALS: Target date: 04/20/23  PT to be I in HEP in order to decrease pain in Lt shoulder to no greater than a 5 Baseline: Goal status: on going   2.  ROM in Lt shoulder to be increased 20 degrees to be able to lift items to shelves that are above shoulder height Goal status: on going   3.   Strength in Lt shoulder to be increased 1/2 grade to be able to be able to lift items to shelves that are above shoulder height. Baseline:  Goal status: on going   LONG TERM GOALS: Target date: 05/11/23  PT to be I in HEP in order to decrease pain in Lt shoulder to no greater than a 1 Baseline:  Goal status: on going   2.  Strength in Lt shoulder to be increased 40 degrees to be able to lift items to shelves that are over head  height. Baseline:  Goal status: on going  3.  Strength in Lt shoulder to be increased 1 grade to be able to lift items to shelves that are over head  height. Baseline:  Goal status:  on going   4.  Pt to be able to complete all housework activity Baseline:  Goal status: on going   5.  Pt to be able to complete all work activity Baseline:  Goal status: on going  PLAN:  PT FREQUENCY: 2x/week  PT DURATION: 6 weeks  PLANNED INTERVENTIONS: Therapeutic exercises, Patient/Family education, Self Care, Joint mobilization, and Manual therapy  PLAN FOR NEXT SESSION: Progress  AROM/strength.  Lurena Nida, PTA/CLT [facility] Ph: [phone]  04/20/2023, 9:57 AM

## 2023-04-25 ENCOUNTER — Ambulatory Visit (HOSPITAL_COMMUNITY): Payer: 59 | Admitting: Physical Therapy

## 2023-04-25 DIAGNOSIS — M25611 Stiffness of right shoulder, not elsewhere classified: Secondary | ICD-10-CM | POA: Diagnosis not present

## 2023-04-25 DIAGNOSIS — M25612 Stiffness of left shoulder, not elsewhere classified: Secondary | ICD-10-CM

## 2023-04-25 DIAGNOSIS — M25512 Pain in left shoulder: Secondary | ICD-10-CM

## 2023-04-25 DIAGNOSIS — M6281 Muscle weakness (generalized): Secondary | ICD-10-CM

## 2023-04-25 NOTE — Therapy (Signed)
OUTPATIENT PHYSICAL THERAPY SHOULDER treatment   Patient Name: Christine Mason MRN: [id number] DOB:1971-08-21, 52 y.o., female Today's Date: 04/25/2023  END OF SESSION:   PT End of Session - 04/25/23 0948     Visit Number 6    Number of Visits 12    Date for PT Re-Evaluation 05/11/23    Authorization Type [facility]    PT Start Time [phone]    PT Stop Time 1028    PT Time Calculation (min) 40 min    Activity Tolerance Patient tolerated treatment well    Behavior During Therapy [facility] for tasks assessed/performed                  Past Medical History:  Diagnosis Date   Anxiety    Arthritis    Back pain    Carpal tunnel syndrome    bi lat hands   Chronic leg pain    right   Chronic pain of left knee    Depression    Diverticulitis    Fatty liver    GERD (gastroesophageal reflux disease)    Heart murmur    High cholesterol    History of kidney stones    Hypertension    Hypothyroidism    Joint pain    Pre-diabetes    Sleep apnea     no C- Pap   Swallowing difficulty    Thyroid cancer [facility])    Past Surgical History:  Procedure Laterality Date   ACNE CYST REMOVAL     BREAST BIOPSY Right 12/22/2022   CHOLECYSTECTOMY     COLONOSCOPY     DILATION AND CURETTAGE OF UTERUS  1998   ESOPHAGOGASTRODUODENOSCOPY (EGD) WITH PROPOFOL N/A 06/13/2019   Procedure: ESOPHAGOGASTRODUODENOSCOPY (EGD) WITH PROPOFOL;  Surgeon: Malissa Hippo, MD;  [facility];  Service: Endoscopy;  Laterality: N/A;  7:30   KNEE SURGERY Left    Meniscus Tear   MALONEY DILATION  06/13/2019   Procedure: MALONEY DILATION;  Surgeon: Malissa Hippo, MD;  [facility];  Service: Endoscopy;;   MASS EXCISION     RADICAL NECK DISSECTION Left 02/28/2023   Procedure: MODIFIED NECK DISSECTION;  Surgeon: Serena Colonel, MD;  Location: [facility] OR;  Service: ENT;  Laterality: Left;   THORACOTOMY Right 2016   THYROIDECTOMY N/A 07/01/2020   Procedure: TOTAL THYROIDECTOMY WITH Central  compartment lymph node disection;  Surgeon: Darnell Level, MD;  Location: [facility];  Service: General;  Laterality: N/A;   Patient Active Problem List   Diagnosis Date Noted   Thyroid cancer (HCC) 02/28/2023   Multiple thyroid nodules 08/24/2022   Hepatic vein stenosis 08/24/2022   Depression 07/11/2022   Other fatigue 06/27/2022   SOBOE (shortness of breath on exertion) 06/27/2022   Osteoarthritis of both knees 06/27/2022   Pre-diabetes 06/27/2022   Other hyperlipidemia 06/27/2022   Depression screening 06/27/2022   Seasonal and perennial allergic rhinitis 03/08/2022   Wheeze 03/08/2022   Gastroesophageal reflux disease 03/08/2022   Hx of papillary thyroid carcinoma 04/14/2021   Status post total thyroidectomy 04/14/2021   Unilateral primary osteoarthritis, left knee 03/24/2021   Chronic pain of left knee 03/24/2021   Morbid obesity (HCC) 02/09/2021   Abnormal liver function tests 02/09/2021   Chest pain 02/09/2021   Chronic low back pain 02/09/2021   Generalized hyperhidrosis 02/09/2021   Major depressive disorder 02/09/2021   Palpitations 02/09/2021   Urinary incontinence 02/09/2021   Impaired fasting glucose 02/01/2021   Essential hypertension 02/01/2021  Mixed hyperlipidemia 02/01/2021   Vitamin D deficiency 02/01/2021   Hypothyroidism 01/12/2021   Hypokalemia 01/12/2021   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   Neoplasm of uncertain behavior of thyroid gland 06/29/2020   Oropharyngeal dysphagia 05/06/2019   Dysphagia, pharyngoesophageal phase 05/05/2019   Vasomotor symptoms due to menopause 03/13/2019   Genetic testing 06/03/2015   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  She was wrestling with a special needs child and really got a work out but is sore today.     Ms. Biswas states that she was not having any problem with her left arm until she had surgery on her neck surgery on 02/28/23.  After the surgery she noted that she has significant decreased use of her Lt arm.  Hand dominance: Right  PERTINENT HISTORY: 2021 thyroidectomy, radical neck dissection with 26 lymphnodes removed on 02/28/23; needs a Lt TKA   PAIN:  Are you having pain? Yes: NPRS scale: 8/10 Pain location: anterior Lt chest went to a cardiologist who stated everything was fine.  Pain description: achy Aggravating factors: constant  Relieving factors: time   PRECAUTIONS: None     WEIGHT BEARING RESTRICTIONS: No  FALLS:  Has patient fallen in last 6 months? No  LIVING ENVIRONMENT: Lives with: lives with their family Lives in: House/apartment OCCUPATION: Nurse tech III mainly sitting   PLOF: Independent  PATIENT GOALS:To be able to use her RT arm again   NEXT MD VISIT: October   OBJECTIVE:   PATIENT SURVEYS:  FOTO 12  COGNITION: Overall cognitive status: Within functional limits for tasks assessed     SENSATION: Per pt she is numb in her face and neck   POSTURE: Rounded shoulder   UPPER EXTREMITY ROM:  supine   Active ROM Right eval Left eval Left AAROM 04/18/23  Shoulder flexion 145 115 150  Shoulder extension     Shoulder abduction 135 85 125  Shoulder adduction     Shoulder internal rotation wfl Wfl   Shoulder external rotation 60 50 70  Elbow flexion     Elbow extension  Wrist flexion     Wrist extension     Wrist ulnar deviation     Wrist radial deviation      Wrist pronation     Wrist supination     (Blank rows = not tested)  UPPER EXTREMITY MMT:  MMT Right eval Left eval  Shoulder flexion 5 2+  Shoulder extension  3  Shoulder abduction 4 2+  Shoulder adduction    Shoulder internal rotation 5 5  Shoulder external rotation 3/5 3/5  Middle trapezius    Lower trapezius    Elbow flexion    Elbow extension    Wrist flexion    Wrist extension    Wrist ulnar deviation    Wrist radial deviation    Wrist pronation    Wrist supination    Grip strength (lbs)    (Blank rows = not tested)       TODAY'S TREATMENT:                                                                                                                                         DATE: 04/25/23 Finger ladder x 5 Attempt  of AROM but not ready Standing palms forward with scapular retraction Sitting:  wand 1# Flexion x 10 Protraction/retraction x 10   2# ER B  AA shoulder abduction x `10  Side-lying  ER: Rt with 2#, Lt 0 x 10 reps  04/19/23 UBE 4' backward level 1 Pulleys flexion/abduction 20X each Sitting:Bil UE flexion 10X  Bil UE abduction (jumping jacks) 10X  Bil UE "flasher"  ER 10X Standing UE flexion (only 90 degrees achieved Lt) Supine: 1# wand for flexion and abduction 10X each  ER with 1# 10X PROM for flexion, abduction and ER  04/18/23 Supine: 1# wand for flexion and abduction 10X each  ER with 1# 10X PROM for flexion, abduction and ER Standing: isometric exercises Lt UE Flexion, abduction and ER x 10 Sitting: pro/retraction x 10 with 1# wand   Bil UE flexion 10X  Bil UE abduction (jumping jacks) 10X  Bil UE "flasher"  ER 10X UBE 4' backward level 1  04/12/23  Supine: PROM for flexion, abduction and ER Wand for flexion and abduction x 10 ER with 1# x 10 Standing:  isometric exercises  Flexion, abduction and ER x 10 Sitting: pro/retraction x 10 with 1# wand  Scapular retraction x 10 Retro shoulder rolls x 15 Cervical side bend B x   3  04/11/23  Pulley Flexion x10 Abduction x 10 Sitting : Wand flexion x 10 Wand abduction x 10 Scapular pro/retraction x 10 ER with 1# x 10  Scapular retraction x 10 Retro shoulder rolls x 10 Cervical side bend B x  3    03/30/23: evaluation    Seated Correct Posture with scapular retraction x  10 reps - 5" hold - Seated Bilateral Shoulder External Rotation with red  theraband  10 reps - 5" hold - Supine Shoulder Flexion Extension AAROM with Dowel   10 reps - Supine Shoulder Abduction AAROM with Dowel    PATIENT EDUCATION:             Education details: HEP Person educated: Patient Education method: Chief Technology Officer Education comprehension: verbalized understanding and returned demonstration  HOME EXERCISE PROGRAM: Access Code: [email or web address] URL: [email or web address] Date: 03/30/2023 Prepared by: Virgina Organ  Exercises - Seated Correct Posture  - 5 x daily - 7 x weekly - 1 sets - 10 reps - 5" hold - Seated Scapular Retraction  - 3 x daily - 7 x weekly - 1 sets - 10 reps - 5" hold - Seated Bilateral Shoulder External Rotation with Resistance  - 3 x daily - 7 x weekly - 1 sets - 10 reps - 5" hold - Supine Shoulder Flexion Extension AAROM with Dowel  - 1 x daily - 7 x weekly - 3 sets - 10 reps - Supine Shoulder Abduction AAROM with Dowel  - 1 x daily - 7 x weekly - 3 sets - 10 reps  04/11/23:  Instructed pt in completing Wand exercises in sitting.              04/12/23:- Isometric Shoulder Flexion at Wall  - 2 x daily - 7 x weekly - 1 sets - 10 reps - 5" hold - Isometric Shoulder External Rotation at Wall  - 2 x daily - 7 x weekly - 1 sets - 10 reps - 5" hold - Isometric Shoulder Abduction at Wall  - 2 x daily - 7 x weekly - 1 sets - 10 reps - 5" hold             04/25/23-- Sidelying Shoulder ER with Towel and Dumbbell  - 2 x daily - 7 x weekly - 1 sets - 10 reps - 3"  hold - Sidelying Shoulder Abduction Full Range of Motion  - 2 x daily - 7 x weekly - 1  sets - 10 reps - 3" hold ASSESSMENT:  CLINICAL IMPRESSION:   Treatment adjusted due to pt having high pain levels today.  Added side lying strengthening exercises with no complaint of pain.  Pt continues to improve despite small set back due to yesterdays activity.  PT continues to have near full ROM in all planes with PROM.   PT continues to improve and make gains.  Pt will continue to benefit from skilled PT to address these issues and return her to previous functioning level.   OBJECTIVE IMPAIRMENTS: decreased activity tolerance, decreased strength, and pain.   ACTIVITY LIMITATIONS: carrying, lifting, and reach over head  PARTICIPATION LIMITATIONS: cleaning  PERSONAL FACTORS: Time since onset of injury/illness/exacerbation and 1 comorbidity: OA  are also affecting patient's functional outcome.   REHAB POTENTIAL: Good  CLINICAL DECISION MAKING: Stable/uncomplicated  EVALUATION COMPLEXITY: Low   GOALS: Goals reviewed with patient? No  SHORT TERM GOALS: Target date: 04/20/23  PT to be I in HEP in order to decrease pain in Lt shoulder to no greater than a 5 Baseline: Goal status: on going   2.  ROM in Lt shoulder to be increased 20 degrees to be able to lift items to shelves that are above shoulder height Goal status: on going   3.   Strength in Lt shoulder to be increased 1/2 grade to be able to be able to lift items to shelves that are above shoulder height. Baseline:  Goal  status: on going   LONG TERM GOALS: Target date: 05/11/23  PT to be I in HEP in order to decrease pain in Lt shoulder to no greater than a 1 Baseline:  Goal status: on going   2.  Strength in Lt shoulder to be increased 40 degrees to be able to lift items to shelves that are over head  height. Baseline:  Goal status: on going  3.  Strength in Lt shoulder to be increased 1 grade to be able to lift items to shelves that are over head  height. Baseline:  Goal status: on going   4.  Pt to be able to  complete all housework activity Baseline:  Goal status: on going   5.  Pt to be able to complete all work activity Baseline:  Goal status: on going  PLAN:  PT FREQUENCY: 2x/week  PT DURATION: 6 weeks  PLANNED INTERVENTIONS: Therapeutic exercises, Patient/Family education, Self Care, Joint mobilization, and Manual therapy  PLAN FOR NEXT SESSION: Progress AROM/strength.  Virgina Organ, PT CLT [phone]  04/25/2023, 10:30 AM

## 2023-05-01 NOTE — Progress Notes (Signed)
Patient Care Team: Benita Stabile, MD as PCP - General (Internal Medicine)  DIAGNOSIS: No diagnosis found.  SUMMARY OF ONCOLOGIC HISTORY: Oncology History   No history exists.    CHIEF COMPLIANT:   INTERVAL HISTORY: Christine Mason is a   ALLERGIES:  is allergic to penicillins.  MEDICATIONS:  Current Outpatient Medications  Medication Sig Dispense Refill   atenolol (TENORMIN) 50 MG tablet Take 1 tablet (50 mg total) by mouth daily. (Patient taking differently: Take 50 mg by mouth at bedtime.)     bacitracin ointment Apply topically every 8 (eight) hours. 120 g 0   Cholecalciferol (VITAMIN D3) 50 MCG (2000 UT) TABS Take 2,000 Units by mouth daily.     desvenlafaxine (PRISTIQ) 100 MG 24 hr tablet Take 1 tablet (100 mg total) by mouth daily.     EPINEPHrine 0.3 mg/0.3 mL IJ SOAJ injection Inject 0.3 mg into the muscle as needed for anaphylaxis. 2 each 1   levocetirizine (XYZAL) 5 MG tablet Take 1 tablet (5 mg total) by mouth daily. 30 tablet 0   levothyroxine (SYNTHROID) 200 MCG tablet Take 1 tablet (200 mcg total) by mouth daily. 90 tablet 3   levothyroxine (SYNTHROID) 50 MCG tablet Take 1 tablet (50 mcg total) by mouth daily. To be taken WITH levothyroxine 200 mcg daily 90 tablet 3   LORazepam (ATIVAN) 1 MG tablet Take 1 mg by mouth at bedtime.      losartan (COZAAR) 100 MG tablet Take 100 mg by mouth daily.      Multiple Vitamin (MULTIVITAMIN) tablet Take 2 tablets by mouth daily. gummy     NEURONTIN 300 MG capsule TAKE 1 CAPSULE BY MOUTH ONCE A DAY AT BEDTIME 90 capsule 3   NON FORMULARY Pt uses a cpap nightly     ondansetron (ZOFRAN-ODT) 4 MG disintegrating tablet Take 1 tablet (4 mg total) by mouth every 8 (eight) hours as needed for nausea or vomiting. 10 tablet 0   oxybutynin (DITROPAN-XL) 10 MG 24 hr tablet Take 10 mg by mouth 2 (two) times daily.     pantoprazole (PROTONIX) 40 MG tablet Take 40 mg by mouth 2 (two) times daily.     rosuvastatin (CRESTOR) 10 MG tablet Take  10 mg by mouth daily.     spironolactone-hydrochlorothiazide (ALDACTAZIDE) 25-25 MG tablet Take 1 tablet by mouth every morning. 30 tablet 2   tiZANidine (ZANAFLEX) 4 MG tablet Take 1 tablet (4 mg total) by mouth every 8 (eight) hours as needed for 10 days. 30 tablet 0   No current facility-administered medications for this visit.    PHYSICAL EXAMINATION: ECOG PERFORMANCE STATUS: {CHL ONC ECOG PS:[id number]}  There were no vitals filed for this visit. There were no vitals filed for this visit.  BREAST:*** No palpable masses or nodules in either right or left breasts. No palpable axillary supraclavicular or infraclavicular adenopathy no breast tenderness or nipple discharge. (exam performed in the presence of a chaperone)  LABORATORY DATA:  I have reviewed the data as listed    Latest Ref Rng & Units 02/20/2023   10:07 AM 06/27/2022   10:01 AM 01/12/2021    3:47 PM  CMP  Glucose 70 - 99 mg/dL 161  85  83   BUN 6 - 20 mg/dL 12  14  13    Creatinine 0.44 - 1.00 mg/dL 0.96  0.45  4.09   Sodium 135 - 145 mmol/L 139  140  142   Potassium 3.5 - 5.1 mmol/L 3.6  4.5  4.1   Chloride 98 - 111 mmol/L 103  99  99   CO2 22 - 32 mmol/L 28  22  25    Calcium 8.9 - 10.3 mg/dL 9.3  9.7  9.9   Total Protein 6.5 - 8.1 g/dL 7.1  7.5    Total Bilirubin 0.3 - 1.2 mg/dL 0.7  0.4    Alkaline Phos 38 - 126 U/L 62  76    AST 15 - 41 U/L 27  28    ALT 0 - 44 U/L 32  33      Lab Results  Component Value Date   WBC 9.4 02/20/2023   HGB 12.7 02/20/2023   HCT 39.5 02/20/2023   MCV 94.5 02/20/2023   PLT 325 02/20/2023   NEUTROABS 3.5 06/27/2022    ASSESSMENT & PLAN:  No problem-specific Assessment & Plan notes found for this encounter.    No orders of the defined types were placed in this encounter.  The patient has a good understanding of the overall plan. she agrees with it. she will call with any problems that may develop before the next visit here. Total time spent: 30 mins including face to  face time and time spent for planning, charting and co-ordination of care   Sherlyn Lick, CMA 05/01/23    I Janan Ridge am acting as a Neurosurgeon for [employer]  ***

## 2023-05-02 ENCOUNTER — Ambulatory Visit (HOSPITAL_COMMUNITY): Payer: 59 | Admitting: Physical Therapy

## 2023-05-02 ENCOUNTER — Encounter (HOSPITAL_COMMUNITY): Payer: Self-pay | Admitting: Physical Therapy

## 2023-05-02 DIAGNOSIS — M6281 Muscle weakness (generalized): Secondary | ICD-10-CM

## 2023-05-02 DIAGNOSIS — M25611 Stiffness of right shoulder, not elsewhere classified: Secondary | ICD-10-CM | POA: Diagnosis not present

## 2023-05-02 DIAGNOSIS — M25612 Stiffness of left shoulder, not elsewhere classified: Secondary | ICD-10-CM | POA: Diagnosis not present

## 2023-05-02 DIAGNOSIS — M25512 Pain in left shoulder: Secondary | ICD-10-CM | POA: Diagnosis not present

## 2023-05-02 NOTE — Therapy (Signed)
OUTPATIENT PHYSICAL THERAPY SHOULDER treatment   Patient Name: Christine Mason MRN: [id number] DOB:1971-06-04, 52 y.o., female Today's Date: 05/02/2023  END OF SESSION:  PT End of Session - 05/02/23 0941     Visit Number 7    Number of Visits 12    Date for PT Re-Evaluation 05/11/23    Authorization Type [facility]    PT Start Time 0900    PT Stop Time 0945    PT Time Calculation (min) 45 min    Activity Tolerance Patient tolerated treatment well    Behavior During Therapy WFL for tasks assessed/performed                   Past Medical History:  Diagnosis Date   Anxiety    Arthritis    Back pain    Carpal tunnel syndrome    bi lat hands   Chronic leg pain    right   Chronic pain of left knee    Depression    Diverticulitis    Fatty liver    GERD (gastroesophageal reflux disease)    Heart murmur    High cholesterol    History of kidney stones    Hypertension    Hypothyroidism    Joint pain    Pre-diabetes    Sleep apnea     no C- Pap   Swallowing difficulty    Thyroid cancer [facility])    Past Surgical History:  Procedure Laterality Date   ACNE CYST REMOVAL     BREAST BIOPSY Right 12/22/2022   CHOLECYSTECTOMY     COLONOSCOPY     DILATION AND CURETTAGE OF UTERUS  1998   ESOPHAGOGASTRODUODENOSCOPY (EGD) WITH PROPOFOL N/A 06/13/2019   Procedure: ESOPHAGOGASTRODUODENOSCOPY (EGD) WITH PROPOFOL;  Surgeon: Christine Hippo, MD;  Location: [facility]ITE;  Service: Endoscopy;  Laterality: N/A;  7:30   KNEE SURGERY Left    Meniscus Tear   MALONEY DILATION  06/13/2019   Procedure: MALONEY DILATION;  Surgeon: Christine Hippo, MD;  Location: [facility];  Service: Endoscopy;;   MASS EXCISION     RADICAL NECK DISSECTION Left 02/28/2023   Procedure: MODIFIED NECK DISSECTION;  Surgeon: Christine Colonel, MD;  Location: [facility] OR;  Service: ENT;  Laterality: Left;   THORACOTOMY Right 2016   THYROIDECTOMY N/A 07/01/2020   Procedure: TOTAL THYROIDECTOMY WITH Central  compartment lymph node disection;  Surgeon: Christine Level, MD;  Location: [facility] ORS;  Service: General;  Laterality: N/A;   Patient Active Problem List   Diagnosis Date Noted   Thyroid cancer (HCC) 02/28/2023   Multiple thyroid nodules 08/24/2022   Hepatic vein stenosis 08/24/2022   Depression 07/11/2022   Other fatigue 06/27/2022   SOBOE (shortness of breath on exertion) 06/27/2022   Osteoarthritis of both knees 06/27/2022   Pre-diabetes 06/27/2022   Other hyperlipidemia 06/27/2022   Depression screening 06/27/2022   Seasonal and perennial allergic rhinitis 03/08/2022   Wheeze 03/08/2022   Gastroesophageal reflux disease 03/08/2022   Hx of papillary thyroid carcinoma 04/14/2021   Status post total thyroidectomy 04/14/2021   Unilateral primary osteoarthritis, left knee 03/24/2021   Chronic pain of left knee 03/24/2021   Morbid obesity (HCC) 02/09/2021   Abnormal liver function tests 02/09/2021   Chest pain 02/09/2021   Chronic low back pain 02/09/2021   Generalized hyperhidrosis 02/09/2021   Major depressive disorder 02/09/2021   Palpitations 02/09/2021   Urinary incontinence 02/09/2021   Impaired fasting glucose 02/01/2021   Essential hypertension 02/01/2021  Mixed hyperlipidemia 02/01/2021   Vitamin D deficiency 02/01/2021   Hypothyroidism 01/12/2021   Hypokalemia 01/12/2021   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   Neoplasm of uncertain behavior of thyroid gland 06/29/2020   Oropharyngeal dysphagia 05/06/2019   Dysphagia, pharyngoesophageal phase 05/05/2019   Vasomotor symptoms due to menopause 03/13/2019   Genetic testing 06/03/2015   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 Muscle weakness (generalized)      ICD-10-CM: M62.81  4.Stiffness of right shoulder, not elsewhere classified      ICD-10-CM: M25.611     Rationale for Evaluation and Treatment: Rehabilitation  ONSET DATE: 02/28/23  SUBJECTIVE:                                                                                                                                                                                      SUBJECTIVE STATEMENT:  Pt states her left shoulder pain increased since yesterday. Patient states the [facility] has been hurting at work. Patient has recently returned to work on 7/15.    [name] states that she was not having any problem with her left arm until she had surgery on her neck surgery on 02/28/23.  After the surgery she noted that she has significant decreased use of her Lt arm.  Hand dominance: Right  PERTINENT HISTORY: 2021 thyroidectomy, radical neck dissection with 26 lymphnodes removed on 02/28/23; needs a Lt TKA   PAIN:  Are you having pain? Yes: NPRS scale: 8/10 Pain location: anterior Lt chest went to a cardiologist who stated everything was fine.  Pain description: achy Aggravating factors: constant  Relieving factors: time   PRECAUTIONS: None     WEIGHT BEARING RESTRICTIONS: No  FALLS:  Has patient fallen in last 6 months? No  LIVING ENVIRONMENT: Lives with: lives with their family Lives in: House/apartment OCCUPATION: Nurse tech III mainly sitting   PLOF: Independent  PATIENT GOALS:To be able to use her RT arm again   NEXT MD VISIT: October   OBJECTIVE:   PATIENT SURVEYS:  FOTO 39  COGNITION: Overall cognitive status: Within functional limits for tasks assessed     SENSATION: Per pt she is numb in her face and neck   POSTURE: Rounded shoulder   UPPER EXTREMITY ROM:  supine   Active ROM Right eval Left eval Left AAROM 04/18/23  Shoulder flexion 145 115  150  Shoulder extension      Shoulder abduction 135 85 125  Shoulder adduction     Shoulder internal rotation wfl Wfl   Shoulder external rotation 60 50 70  Elbow flexion     Elbow extension     Wrist flexion     Wrist extension     Wrist ulnar deviation     Wrist radial deviation     Wrist pronation     Wrist supination     (Blank rows = not tested)  UPPER EXTREMITY MMT:  MMT Right eval Left eval  Shoulder flexion 5 2+  Shoulder extension  3  Shoulder abduction 4 2+  Shoulder adduction    Shoulder internal rotation 5 5  Shoulder external rotation 3/5 3/5  Middle trapezius    Lower trapezius    Elbow flexion    Elbow extension    Wrist flexion    Wrist extension    Wrist ulnar deviation    Wrist radial deviation    Wrist pronation    Wrist supination    Grip strength (lbs)    (Blank rows = not tested)       TODAY'S TREATMENT:                                                                                                                                         DATE:  05/02/23 Therapeutic exercise x45 min  - R sidelying L Shoulder Abduction to 90*(gravity eliminated) 3x5   - R sideyling L Scaption 3x5  - R sidelying L Sidelying ER vs 1# 4x5  - Seated stick scaption 3x5    04/25/23 Finger ladder x 5 Attempt  of AROM but not ready Standing palms forward with scapular retraction Sitting:  wand 1# Flexion x 10 Protraction/retraction x 10   2# ER B  AA shoulder abduction x `10  Side-lying  ER: Rt with 2#, Lt 0 x 10 reps   04/19/23 UBE 4' backward Mason 1 Pulleys flexion/abduction 20X each Sitting:Bil UE flexion 10X  Bil UE abduction (jumping jacks) 10X  Bil UE "flasher"  ER 10X Standing UE flexion (only 90 degrees achieved Lt) Supine: 1# wand for flexion and abduction 10X each  ER with 1# 10X PROM for flexion, abduction and ER  04/18/23 Supine: 1# wand for flexion and abduction 10X each  ER with 1# 10X PROM for flexion, abduction and ER Standing: isometric exercises Lt UE  Flexion, abduction and ER x 10 Sitting: pro/retraction x 10 with 1# wand   Bil UE flexion 10X  Bil UE abduction (jumping jacks) 10X  Bil UE "flasher"  ER 10X UBE 4' backward Mason 1   PATIENT EDUCATION:             Education details: HEP Person educated: Patient Education method: Explanation and Handouts Education comprehension: verbalized understanding and returned demonstration  HOME EXERCISE PROGRAM: Access Code:  [facility] URL: [email or web address]  Date: 03/30/2023 - Seated Correct Posture  - 5 x daily - 7 x weekly - 1 sets - 10 reps - 5" hold - Seated Scapular Retraction  - 3 x daily - 7 x weekly - 1 sets - 10 reps - 5" hold - Seated Bilateral Shoulder External Rotation with Resistance  - 3 x daily - 7 x weekly - 1 sets - 10 reps - 5" hold - Supine Shoulder Flexion Extension AAROM with Dowel  - 1 x daily - 7 x weekly - 3 sets - 10 reps - Supine Shoulder Abduction AAROM with Dowel  - 1 x daily - 7 x weekly - 3 sets - 10 reps  04/11/23:  Instructed pt in completing Wand exercises in sitting.              04/12/23:- Isometric Shoulder Flexion at Wall  - 2 x daily - 7 x weekly - 1 sets - 10 reps - 5" hold - Isometric Shoulder External Rotation at Wall  - 2 x daily - 7 x weekly - 1 sets - 10 reps - 5" hold - Isometric Shoulder Abduction at Wall  - 2 x daily - 7 x weekly - 1 sets - 10 reps - 5" hold             04/25/23-- Sidelying Shoulder ER with Towel and Dumbbell  - 2 x daily - 7 x weekly - 1 sets - 10 reps - 3"  hold - Sidelying Shoulder Abduction Full Range of Motion  - 2 x daily - 7 x weekly - 1 sets - 10 reps - 3" hold  ASSESSMENT:  CLINICAL IMPRESSION:   Treatment adjusted due to pt having high pain levels today.  Added side lying strengthening exercises with no complaint of pain in a gravity-eliminated position. PT trialed RTC strengthening in sidelying position followed by seated AAROM with the stick. Pt will continue to benefit from skilled PT to address  these issues and return her to previous functioning Mason.   OBJECTIVE IMPAIRMENTS: decreased activity tolerance, decreased strength, and pain.   ACTIVITY LIMITATIONS: carrying, lifting, and reach over head  PARTICIPATION LIMITATIONS: cleaning  PERSONAL FACTORS: Time since onset of injury/illness/exacerbation and 1 comorbidity: OA  are also affecting patient's functional outcome.   REHAB POTENTIAL: Good  CLINICAL DECISION MAKING: Stable/uncomplicated  EVALUATION COMPLEXITY: Low   GOALS: Goals reviewed with patient? No  SHORT TERM GOALS: Target date: 04/20/23  PT to be I in HEP in order to decrease pain in Lt shoulder to no greater than a 5 Baseline: Goal status: on going   2.  ROM in Lt shoulder to be increased 20 degrees to be able to lift items to shelves that are above shoulder height Goal status: on going   3.   Strength in Lt shoulder to be increased 1/2 grade to be able to be able to lift items to shelves that are above shoulder height. Baseline:  Goal status: on going   LONG TERM GOALS: Target date: 05/11/23  PT to be I in HEP in order to decrease pain in Lt shoulder to no greater than a 1 Baseline:  Goal status: on going   2.  Strength in Lt shoulder to be increased 40 degrees to be able to lift items to shelves that are over head  height. Baseline:  Goal status: on going  3.  Strength in Lt shoulder to be increased 1 grade to be able to lift items  to shelves that are over head  height. Baseline:  Goal status: on going   4.  Pt to be able to complete all housework activity Baseline:  Goal status: on going   5.  Pt to be able to complete all work activity Baseline:  Goal status: on going  PLAN:  PT FREQUENCY: 2x/week  PT DURATION: 6 weeks  PLANNED INTERVENTIONS: Therapeutic exercises, Patient/Family education, Self Care, Joint mobilization, and Manual therapy  PLAN FOR NEXT SESSION: Progress AROM/strength.  Seymour Bars PT, DPT  05/02/2023, 1:14 PM

## 2023-05-03 ENCOUNTER — Inpatient Hospital Stay: Payer: 59 | Attending: Hematology and Oncology | Admitting: Hematology and Oncology

## 2023-05-03 ENCOUNTER — Other Ambulatory Visit: Payer: Self-pay

## 2023-05-03 VITALS — BP 144/61 | HR 87 | Temp 97.8°F | Resp 18 | Ht 63.0 in | Wt 316.2 lb

## 2023-05-03 DIAGNOSIS — N6320 Unspecified lump in the left breast, unspecified quadrant: Secondary | ICD-10-CM | POA: Insufficient documentation

## 2023-05-03 DIAGNOSIS — Z1501 Genetic susceptibility to malignant neoplasm of breast: Secondary | ICD-10-CM | POA: Diagnosis not present

## 2023-05-03 DIAGNOSIS — Z7183 Encounter for nonprocreative genetic counseling: Secondary | ICD-10-CM

## 2023-05-03 DIAGNOSIS — Z1509 Genetic susceptibility to other malignant neoplasm: Secondary | ICD-10-CM | POA: Insufficient documentation

## 2023-05-03 DIAGNOSIS — Z79899 Other long term (current) drug therapy: Secondary | ICD-10-CM | POA: Insufficient documentation

## 2023-05-03 DIAGNOSIS — Z803 Family history of malignant neoplasm of breast: Secondary | ICD-10-CM | POA: Diagnosis not present

## 2023-05-03 NOTE — Assessment & Plan Note (Signed)
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  Lifetime risk of breast cancer in vary from 28% to 38%. Higher with family history of breast cancer.    Breast Cancer Surveillance: 1. Breast exam 05/03/2023: Normal 2. Mammogram 06/19/2022: Benign breast density category B 3.  Breast MRI 12/12/2022: Irregular enhancing mass left breast 1 cm, biopsy: Benign.   Breast cancer risk reduction: She may decide to do bilateral mastectomies at some point in the future.   Total thyroidectomy 07/01/2020: Papillary thyroid carcinoma, classical variant, 1 cm, involving the lower pole of right lobe and left lobe without extrathyroidal extension, margins negative, negative for LVI, metastatic carcinoma in 2/2 lymph nodes focal extranodal extension is present Status post radioactive iodine therapy 01/18/2022: Ultrasound-guided FNA left neck mass: Benign follicular nodule   Obesity: We will refer her to healthy weight loss clinic.     Return to clinic in 1 year for follow-up

## 2023-05-07 ENCOUNTER — Ambulatory Visit (HOSPITAL_COMMUNITY): Payer: 59 | Admitting: Physical Therapy

## 2023-05-07 DIAGNOSIS — M25512 Pain in left shoulder: Secondary | ICD-10-CM | POA: Diagnosis not present

## 2023-05-07 DIAGNOSIS — M25612 Stiffness of left shoulder, not elsewhere classified: Secondary | ICD-10-CM

## 2023-05-07 DIAGNOSIS — M6281 Muscle weakness (generalized): Secondary | ICD-10-CM | POA: Diagnosis not present

## 2023-05-07 DIAGNOSIS — M25611 Stiffness of right shoulder, not elsewhere classified: Secondary | ICD-10-CM | POA: Diagnosis not present

## 2023-05-07 NOTE — Therapy (Signed)
OUTPATIENT PHYSICAL THERAPY SHOULDER treatment   Patient Name: Christine Mason MRN: [id number] DOB:Oct 15, 1970, 52 y.o., female Today's Date: 05/07/2023  END OF SESSION:  PT End of Session - 05/07/23 0815     Visit Number 8    Number of Visits 12    Date for PT Re-Evaluation 05/11/23    Authorization Type [facility]    PT Start Time 0815    PT Stop Time 0902    PT Time Calculation (min) 47 min    Activity Tolerance Patient tolerated treatment well    Behavior During Therapy [facility] for tasks assessed/performed                   Past Medical History:  Diagnosis Date   Anxiety    Arthritis    Back pain    Carpal tunnel syndrome    bi lat hands   Chronic leg pain    right   Chronic pain of left knee    Depression    Diverticulitis    Fatty liver    GERD (gastroesophageal reflux disease)    Heart murmur    High cholesterol    History of kidney stones    Hypertension    Hypothyroidism    Joint pain    Pre-diabetes    Sleep apnea     no C- Pap   Swallowing difficulty    Thyroid cancer [facility])    Past Surgical History:  Procedure Laterality Date   ACNE CYST REMOVAL     BREAST BIOPSY Right 12/22/2022   CHOLECYSTECTOMY     COLONOSCOPY     DILATION AND CURETTAGE OF UTERUS  1998   ESOPHAGOGASTRODUODENOSCOPY (EGD) WITH PROPOFOL N/A 06/13/2019   Procedure: ESOPHAGOGASTRODUODENOSCOPY (EGD) WITH PROPOFOL;  Surgeon: Christine Hippo, MD;  Location: [facility];  Service: Endoscopy;  Laterality: N/A;  7:30   KNEE SURGERY Left    Meniscus Tear   MALONEY DILATION  06/13/2019   Procedure: MALONEY DILATION;  Surgeon: Christine Hippo, MD;  Location: [facility];  Service: Endoscopy;;   MASS EXCISION     RADICAL NECK DISSECTION Left 02/28/2023   Procedure: MODIFIED NECK DISSECTION;  Surgeon: Christine Colonel, MD;  Location: [facility] OR;  Service: ENT;  Laterality: Left;   THORACOTOMY Right 2016   THYROIDECTOMY N/A 07/01/2020   Procedure: TOTAL THYROIDECTOMY WITH Central  compartment lymph node disection;  Surgeon: Christine Level, MD;  Location: [facility] ORS;  Service: General;  Laterality: N/A;   Patient Active Problem List   Diagnosis Date Noted   Thyroid cancer (HCC) 02/28/2023   Multiple thyroid nodules 08/24/2022   Hepatic vein stenosis 08/24/2022   Depression 07/11/2022   Other fatigue 06/27/2022   SOBOE (shortness of breath on exertion) 06/27/2022   Osteoarthritis of both knees 06/27/2022   Pre-diabetes 06/27/2022   Other hyperlipidemia 06/27/2022   Depression screening 06/27/2022   Seasonal and perennial allergic rhinitis 03/08/2022   Wheeze 03/08/2022   Gastroesophageal reflux disease 03/08/2022   Hx of papillary thyroid carcinoma 04/14/2021   Status post total thyroidectomy 04/14/2021   Unilateral primary osteoarthritis, left knee 03/24/2021   Chronic pain of left knee 03/24/2021   Morbid obesity (HCC) 02/09/2021   Abnormal liver function tests 02/09/2021   Chest pain 02/09/2021   Chronic low back pain 02/09/2021   Generalized hyperhidrosis 02/09/2021   Major depressive disorder 02/09/2021   Palpitations 02/09/2021   Urinary incontinence 02/09/2021   Impaired fasting glucose 02/01/2021   Essential hypertension 02/01/2021  Mixed hyperlipidemia 02/01/2021   Vitamin D deficiency 02/01/2021   Hypothyroidism 01/12/2021   Hypokalemia 01/12/2021   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   Neoplasm of uncertain behavior of thyroid gland 06/29/2020   Oropharyngeal dysphagia 05/06/2019   Dysphagia, pharyngoesophageal phase 05/05/2019   Vasomotor symptoms due to menopause 03/13/2019   Genetic testing 06/03/2015   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 Muscle weakness (generalized)      ICD-10-CM: M62.81  4.Stiffness of right shoulder, not elsewhere classified      ICD-10-CM: M25.611     Rationale for Evaluation and Treatment: Rehabilitation  ONSET DATE: 02/28/23  SUBJECTIVE:                                                                                                                                                                                      SUBJECTIVE STATEMENT:  Pt states her Lt shoulder is still hurting at around 6/10 today.  States she worked all weekend and is tired.   [name] states that she was not having any problem with her left arm until she had surgery on her neck surgery on 02/28/23.  After the surgery she noted that she has significant decreased use of her Lt arm.  Pt returned to work on 7/15 as NT III (states she sits most of the time) Hand dominance: Right  PERTINENT HISTORY: 2021 thyroidectomy, radical neck dissection with 26 lymphnodes removed on 02/28/23; needs a Lt TKA  PAIN:  Are you having pain? Yes: NPRS scale: 6/10 Pain location: anterior Lt chest went to a cardiologist who stated everything was fine.  Pain description: achy Aggravating factors: constant  Relieving factors: time   PRECAUTIONS: None     WEIGHT BEARING RESTRICTIONS: No  FALLS:  Has patient fallen in last 6 months? No  LIVING ENVIRONMENT: Lives with: lives with their family Lives in: House/apartment OCCUPATION: Nurse tech III mainly sitting   PLOF: Independent  PATIENT GOALS:To be able to use her RT arm again   NEXT MD VISIT: October   OBJECTIVE:   PATIENT SURVEYS:  FOTO 80  COGNITION: Overall cognitive status: Within functional limits for tasks assessed     SENSATION: Per pt she is numb in her face and neck   POSTURE: Rounded shoulder   UPPER EXTREMITY ROM:  supine   Active ROM Right eval Left  eval Left AAROM 04/18/23 Left AAROM 05/07/23  Shoulder flexion  145 115 150 170  Shoulder extension      Shoulder abduction 135 85 125 155  Shoulder adduction      Shoulder internal rotation wfl Wfl  90  Shoulder external rotation 60 50 70 85  Elbow flexion      Elbow extension      Wrist flexion      Wrist extension      Wrist ulnar deviation      Wrist radial deviation      Wrist pronation      Wrist supination      (Blank rows = not tested)  UPPER EXTREMITY MMT:  MMT Right eval Left eval  Shoulder flexion 5 2+  Shoulder extension  3  Shoulder abduction 4 2+  Shoulder adduction    Shoulder internal rotation 5 5  Shoulder external rotation 3/5 3/5  Middle trapezius    Lower trapezius    Elbow flexion    Elbow extension    Wrist flexion    Wrist extension    Wrist ulnar deviation    Wrist radial deviation    Wrist pronation    Wrist supination    Grip strength (lbs)    (Blank rows = not tested)       TODAY'S TREATMENT:                                                                                                                                         DATE: 05/07/23 UBE 4' backward Mason 1 Pulleys flexion/abd 2 minutes each Finger ladder 5X flexion, 5X abduction Supine:1# cane flexion, abd, ER 10X Sidelying Lt shoulder abd 10X  Lt scaption 10X AAROM measurements today (see chart above)   05/02/23 Therapeutic exercise x45 min  - R sidelying L Shoulder Abduction to 90*(gravity eliminated) 3x5   - R sideyling L Scaption 3x5  - R sidelying L Sidelying ER vs 1# 4x5  - Seated stick scaption 3x5  04/25/23 Finger ladder x 5 Attempt  of AROM but not ready Standing palms forward with scapular retraction Sitting:  wand 1# Flexion x 10 Protraction/retraction x 10   2# ER B  AA shoulder abduction x `10  Side-lying  ER: Rt with 2#, Lt 0 x 10 reps   04/19/23 UBE 4' backward Mason 1 Pulleys flexion/abduction 20X each Sitting:Bil UE flexion 10X  Bil UE abduction (jumping jacks) 10X  Bil UE "flasher"  ER 10X Standing UE  flexion (only 90 degrees achieved Lt) Supine: 1# wand for flexion and abduction 10X each  ER with 1# 10X PROM for flexion, abduction and ER  04/18/23 Supine: 1# wand for flexion and abduction 10X each  ER with 1# 10X PROM for flexion, abduction and ER Standing: isometric exercises Lt UE Flexion, abduction and ER x 10 Sitting: pro/retraction x 10 with 1# wand  Bil UE flexion 10X  Bil UE abduction (jumping jacks) 10X  Bil UE "flasher"  ER 10X UBE 4' backward Mason 1   PATIENT EDUCATION:             Education details: HEP Person educated: Patient Education method: Explanation and Handouts Education comprehension: verbalized understanding and returned demonstration  HOME EXERCISE PROGRAM: Access Code: [email or web address] URL: [email or web address]  Date: 03/30/2023 - Seated Correct Posture  - 5 x daily - 7 x weekly - 1 sets - 10 reps - 5" hold - Seated Scapular Retraction  - 3 x daily - 7 x weekly - 1 sets - 10 reps - 5" hold - Seated Bilateral Shoulder External Rotation with Resistance  - 3 x daily - 7 x weekly - 1 sets - 10 reps - 5" hold - Supine Shoulder Flexion Extension AAROM with Dowel  - 1 x daily - 7 x weekly - 3 sets - 10 reps - Supine Shoulder Abduction AAROM with Dowel  - 1 x daily - 7 x weekly - 3 sets - 10 reps  04/11/23:  Instructed pt in completing Wand exercises in sitting.              04/12/23:- Isometric Shoulder Flexion at Wall  - 2 x daily - 7 x weekly - 1 sets - 10 reps - 5" hold - Isometric Shoulder External Rotation at Wall  - 2 x daily - 7 x weekly - 1 sets - 10 reps - 5" hold - Isometric Shoulder Abduction at Wall  - 2 x daily - 7 x weekly - 1 sets - 10 reps - 5" hold             04/25/23-- Sidelying Shoulder ER with Towel and Dumbbell  - 2 x daily - 7 x weekly - 1 sets - 10 reps - 3"  hold - Sidelying Shoulder Abduction Full Range of Motion  - 2 x daily - 7 x weekly - 1 sets - 10 reps - 3" hold  ASSESSMENT:  CLINICAL IMPRESSION:   Continued with  A/AROM for Lt shoulder.  Measured this session with overall improvement in all planes of motion.  PT continues to have general weakness of LT shoulder and discomfort which is heading advancement/progression.  Pt has acquired a 1# DB to use at home as used in clinic.  Continued with supine and side lying strengthening exercises with no complaint of pain in these positions. No c/o pain or pain behaviors noted throughout session today. Pt will continue to benefit from skilled PT to address these issues and return her to previous functioning Mason.   OBJECTIVE IMPAIRMENTS: decreased activity tolerance, decreased strength, and pain.   ACTIVITY LIMITATIONS: carrying, lifting, and reach over head  PARTICIPATION LIMITATIONS: cleaning  PERSONAL FACTORS: Time since onset of injury/illness/exacerbation and 1 comorbidity: OA  are also affecting patient's functional outcome.   REHAB POTENTIAL: Good  CLINICAL DECISION MAKING: Stable/uncomplicated  EVALUATION COMPLEXITY: Low   GOALS: Goals reviewed with patient? No  SHORT TERM GOALS: Target date: 04/20/23  PT to be I in HEP in order to decrease pain in Lt shoulder to no greater than a 5 Baseline: Goal status: on going   2.  ROM in Lt shoulder to be increased 20 degrees to be able to lift items to shelves that are above shoulder height Goal status: on going   3.   Strength in Lt shoulder to be increased 1/2 grade to be able to be able to lift  items to shelves that are above shoulder height. Baseline:  Goal status: on going   LONG TERM GOALS: Target date: 05/11/23  PT to be I in HEP in order to decrease pain in Lt shoulder to no greater than a 1 Baseline:  Goal status: on going   2.  Strength in Lt shoulder to be increased 40 degrees to be able to lift items to shelves that are over head  height. Baseline:  Goal status: on going  3.  Strength in Lt shoulder to be increased 1 grade to be able to lift items to shelves that are over head   height. Baseline:  Goal status: on going   4.  Pt to be able to complete all housework activity Baseline:  Goal status: on going   5.  Pt to be able to complete all work activity Baseline:  Goal status: on going  PLAN:  PT FREQUENCY: 2x/week  PT DURATION: 6 weeks  PLANNED INTERVENTIONS: Therapeutic exercises, Patient/Family education, Self Care, Joint mobilization, and Manual therapy  PLAN FOR NEXT SESSION: Progress AROM/strength.    Bascom Levels, Ercel Pepitone B, PTA 05/07/2023, 11:06 AM  05/07/2023, 11:04 AM

## 2023-05-09 ENCOUNTER — Ambulatory Visit (HOSPITAL_COMMUNITY): Payer: 59 | Admitting: Physical Therapy

## 2023-05-09 DIAGNOSIS — M6281 Muscle weakness (generalized): Secondary | ICD-10-CM | POA: Diagnosis not present

## 2023-05-09 DIAGNOSIS — R7301 Impaired fasting glucose: Secondary | ICD-10-CM | POA: Diagnosis not present

## 2023-05-09 DIAGNOSIS — M25612 Stiffness of left shoulder, not elsewhere classified: Secondary | ICD-10-CM | POA: Diagnosis not present

## 2023-05-09 DIAGNOSIS — M25512 Pain in left shoulder: Secondary | ICD-10-CM | POA: Diagnosis not present

## 2023-05-09 DIAGNOSIS — M25611 Stiffness of right shoulder, not elsewhere classified: Secondary | ICD-10-CM | POA: Diagnosis not present

## 2023-05-09 DIAGNOSIS — E559 Vitamin D deficiency, unspecified: Secondary | ICD-10-CM | POA: Diagnosis not present

## 2023-05-09 DIAGNOSIS — I10 Essential (primary) hypertension: Secondary | ICD-10-CM | POA: Diagnosis not present

## 2023-05-09 NOTE — Therapy (Signed)
OUTPATIENT PHYSICAL THERAPY SHOULDER treatment   Patient Name: Christine Mason MRN: [id number] DOB:06/02/1971, 52 y.o., female Today's Date: 05/09/2023  END OF SESSION:  PT End of Session - 05/09/23 1346     Visit Number 9    Number of Visits 12    Date for PT Re-Evaluation 05/11/23    Authorization Type [facility]    PT Start Time 1345    PT Stop Time 1428    PT Time Calculation (min) 43 min    Activity Tolerance Patient tolerated treatment well    Behavior During Therapy WFL for tasks assessed/performed              Past Medical History:  Diagnosis Date   Anxiety    Arthritis    Back pain    Carpal tunnel syndrome    bi lat hands   Chronic leg pain    right   Chronic pain of left knee    Depression    Diverticulitis    Fatty liver    GERD (gastroesophageal reflux disease)    Heart murmur    High cholesterol    History of kidney stones    Hypertension    Hypothyroidism    Joint pain    Pre-diabetes    Sleep apnea     no C- Pap   Swallowing difficulty    Thyroid cancer [facility])    Past Surgical History:  Procedure Laterality Date   ACNE CYST REMOVAL     BREAST BIOPSY Right 12/22/2022   CHOLECYSTECTOMY     COLONOSCOPY     DILATION AND CURETTAGE OF UTERUS  1998   ESOPHAGOGASTRODUODENOSCOPY (EGD) WITH PROPOFOL N/A 06/13/2019   Procedure: ESOPHAGOGASTRODUODENOSCOPY (EGD) WITH PROPOFOL;  Surgeon: Malissa Hippo, MD;  Location: [facility];  Service: Endoscopy;  Laterality: N/A;  7:30   KNEE SURGERY Left    Meniscus Tear   MALONEY DILATION  06/13/2019   Procedure: MALONEY DILATION;  Surgeon: Malissa Hippo, MD;  Location: [facility];  Service: Endoscopy;;   MASS EXCISION     RADICAL NECK DISSECTION Left 02/28/2023   Procedure: MODIFIED NECK DISSECTION;  Surgeon: Serena Colonel, MD;  Location: [facility] OR;  Service: ENT;  Laterality: Left;   THORACOTOMY Right 2016   THYROIDECTOMY N/A 07/01/2020   Procedure: TOTAL THYROIDECTOMY WITH Central compartment  lymph node disection;  Surgeon: Darnell Level, MD;  Location: [facility];  Service: General;  Laterality: N/A;   Patient Active Problem List   Diagnosis Date Noted   Thyroid cancer (HCC) 02/28/2023   Multiple thyroid nodules 08/24/2022   Hepatic vein stenosis 08/24/2022   Depression 07/11/2022   Other fatigue 06/27/2022   SOBOE (shortness of breath on exertion) 06/27/2022   Osteoarthritis of both knees 06/27/2022   Pre-diabetes 06/27/2022   Other hyperlipidemia 06/27/2022   Depression screening 06/27/2022   Seasonal and perennial allergic rhinitis 03/08/2022   Wheeze 03/08/2022   Gastroesophageal reflux disease 03/08/2022   Hx of papillary thyroid carcinoma 04/14/2021   Status post total thyroidectomy 04/14/2021   Unilateral primary osteoarthritis, left knee 03/24/2021   Chronic pain of left knee 03/24/2021   Morbid obesity (HCC) 02/09/2021   Abnormal liver function tests 02/09/2021   Chest pain 02/09/2021   Chronic low back pain 02/09/2021   Generalized hyperhidrosis 02/09/2021   Major depressive disorder 02/09/2021   Palpitations 02/09/2021   Urinary incontinence 02/09/2021   Impaired fasting glucose 02/01/2021   Essential hypertension 02/01/2021   Mixed hyperlipidemia 02/01/2021  Vitamin D deficiency 02/01/2021   Hypothyroidism 01/12/2021   Hypokalemia 01/12/2021   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   Neoplasm of uncertain behavior of thyroid gland 06/29/2020   Oropharyngeal dysphagia 05/06/2019   Dysphagia, pharyngoesophageal phase 05/05/2019   Vasomotor symptoms due to menopause 03/13/2019   Genetic testing 06/03/2015   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 Muscle weakness (generalized)      ICD-10-CM: M62.81  4.Stiffness of right shoulder, not elsewhere classified      ICD-10-CM: M25.611     Rationale for Evaluation and Treatment: Rehabilitation  ONSET DATE: 02/28/23  SUBJECTIVE:                                                                                                                                                                                      SUBJECTIVE STATEMENT:  Pt states her Lt shoulder is still hurting at around 6/10 today. Pt is able to get her bra on easier.   [name] states that she was not having any problem with her left arm until she had surgery on her neck surgery on 02/28/23.  After the surgery she noted that she has significant decreased use of her Lt arm.  Pt returned to work on 7/15 as NT III (states she sits most of the time) Hand dominance: Right  PERTINENT HISTORY: 2021 thyroidectomy, radical neck dissection with 26 lymphnodes removed on 02/28/23; needs a Lt TKA  PAIN:  Are you having pain? Yes: NPRS scale: 6/10 Pain location: anterior Lt chest went to a cardiologist who stated everything was fine.  Pain description: achy Aggravating factors: constant  Relieving factors: time   PRECAUTIONS: None     WEIGHT BEARING RESTRICTIONS: No  FALLS:  Has patient fallen in last 6 months? No  LIVING ENVIRONMENT: Lives with: lives with their family Lives in: House/apartment OCCUPATION: Nurse tech III mainly sitting   PLOF: Independent  PATIENT GOALS:To be able to use her RT arm again   NEXT MD VISIT: October   OBJECTIVE:   PATIENT SURVEYS:  FOTO 53  COGNITION: Overall cognitive status: Within functional limits for tasks assessed     SENSATION: Per pt she is numb in her face and neck   POSTURE: Rounded shoulder   UPPER EXTREMITY ROM:  supine   Active ROM Right eval Left eval Left AAROM 04/18/23 Left  AAROM 05/07/23  Shoulder flexion 145 115 150 170  Shoulder  extension      Shoulder abduction 135 85 125 155  Shoulder adduction      Shoulder internal rotation wfl Wfl  90  Shoulder external rotation 60 50 70 85  Elbow flexion      Elbow extension      Wrist flexion      Wrist extension      Wrist ulnar deviation      Wrist radial deviation      Wrist pronation      Wrist supination      (Blank rows = not tested)  UPPER EXTREMITY MMT:  MMT Right eval Left eval  Shoulder flexion 5 2+  Shoulder extension  3  Shoulder abduction 4 2+  Shoulder adduction    Shoulder internal rotation 5 5  Shoulder external rotation 3/5 3/5  Middle trapezius    Lower trapezius    Elbow flexion    Elbow extension    Wrist flexion    Wrist extension    Wrist ulnar deviation    Wrist radial deviation    Wrist pronation    Wrist supination    Grip strength (lbs)    (Blank rows = not tested)       TODAY'S TREATMENT:                                                                                                                                         DATE: 05/09/23 Sitting:  Wand flexion  x 10  Wand abduction x 10 ER: 2# x 10  Supine:  2# wand Flexion/abduction x 10 Side lying ER 1# x 10 Abduction 2#x 10  PROM  05/07/23 UBE 4' backward level 1 Pulleys flexion/abd 2 minutes each Finger ladder 5X flexion, 5X abduction Supine:1# cane flexion, abd, ER 10X Sidelying Lt shoulder abd 10X  Lt scaption 10X AAROM measurements today (see chart above)   05/02/23 Therapeutic exercise x45 min  - R sidelying L Shoulder Abduction to 90*(gravity eliminated) 3x5   - R sideyling L Scaption 3x5  - R sidelying L Sidelying ER vs 1# 4x5  - Seated stick scaption 3x5  04/25/23 Finger ladder x 5 Attempt  of AROM but not ready Standing palms forward with scapular retraction Sitting:  wand 1# Flexion x 10 Protraction/retraction x 10   2# ER B  AA shoulder abduction x `10  Side-lying  ER: Rt with 2#, Lt 0 x 10 reps   04/19/23 UBE 4' backward level  1 Pulleys flexion/abduction 20X each Sitting:Bil UE flexion 10X  Bil UE abduction (jumping jacks) 10X  Bil UE "flasher"  ER 10X Standing UE flexion (only 90 degrees achieved Lt) Supine: 1# wand for flexion and abduction 10X each  ER with 1# 10X PROM for flexion, abduction and ER  04/18/23 Supine: 1# wand for flexion and abduction 10X  each  ER with 1# 10X PROM for flexion, abduction and ER Standing: isometric exercises Lt UE Flexion, abduction and ER x 10 Sitting: pro/retraction x 10 with 1# wand   Bil UE flexion 10X  Bil UE abduction (jumping jacks) 10X  Bil UE "flasher"  ER 10X UBE 4' backward level 1   PATIENT EDUCATION:             Education details: HEP Person educated: Patient Education method: Explanation and Handouts Education comprehension: verbalized understanding and returned demonstration  HOME EXERCISE PROGRAM: Access Code: [email or web address] URL: [email or web address]  Date: 03/30/2023 - Seated Correct Posture  - 5 x daily - 7 x weekly - 1 sets - 10 reps - 5" hold - Seated Scapular Retraction  - 3 x daily - 7 x weekly - 1 sets - 10 reps - 5" hold - Seated Bilateral Shoulder External Rotation with Resistance  - 3 x daily - 7 x weekly - 1 sets - 10 reps - 5" hold - Supine Shoulder Flexion Extension AAROM with Dowel  - 1 x daily - 7 x weekly - 3 sets - 10 reps - Supine Shoulder Abduction AAROM with Dowel  - 1 x daily - 7 x weekly - 3 sets - 10 reps  04/11/23:  Instructed pt in completing Wand exercises in sitting.              04/12/23:- Isometric Shoulder Flexion at Wall  - 2 x daily - 7 x weekly - 1 sets - 10 reps - 5" hold - Isometric Shoulder External Rotation at Wall  - 2 x daily - 7 x weekly - 1 sets - 10 reps - 5" hold - Isometric Shoulder Abduction at Wall  - 2 x daily - 7 x weekly - 1 sets - 10 reps - 5" hold             04/25/23-- Sidelying Shoulder ER with Towel and Dumbbell  - 2 x daily - 7 x weekly - 1 sets - 10 reps - 3"  hold - Sidelying Shoulder  Abduction Full Range of Motion  - 2 x daily - 7 x weekly - 1 sets - 10 reps - 3" hold   05/08/2021:- Seated Shoulder Flexion AAROM with Dowel  - 1 x daily - 7 x weekly - 1 sets - 10 reps - 5" hold - Seated Shoulder Abduction AAROM with Dowel  - 1 x daily - 7 x weekly - 1 sets - 10 reps - 5" hold - Seated Shoulder External Rotation AAROM with Dowel  - 1 x daily - 7 x weekly - 1 sets - 10 reps ASSESSMENT:  CLINICAL IMPRESSION:    PT continues to have  weakness of LT shoulder and discomfort which is heading advancement/progression.  Advanced pt to sitting AA ROM with assistance of wand for home exercise program.   Continued with supine and side lying strengthening exercises with no complaint of pain in these positions. No c/o pain or pain behaviors noted throughout session today. Pt will continue to benefit from skilled PT to address these issues and return her to previous functioning level.   OBJECTIVE IMPAIRMENTS: decreased activity tolerance, decreased strength, and pain.   ACTIVITY LIMITATIONS: carrying, lifting, and reach over head  PARTICIPATION LIMITATIONS: cleaning  PERSONAL FACTORS: Time since onset of injury/illness/exacerbation and 1 comorbidity: OA  are also affecting patient's functional outcome.   REHAB POTENTIAL: Good  CLINICAL DECISION MAKING: Stable/uncomplicated  EVALUATION COMPLEXITY: Low   GOALS: Goals  reviewed with patient? No  SHORT TERM GOALS: Target date: 04/20/23  PT to be I in HEP in order to decrease pain in Lt shoulder to no greater than a 5 Baseline: Goal status: on going   2.  ROM in Lt shoulder to be increased 20 degrees to be able to lift items to shelves that are above shoulder height Goal status: on going   3.   Strength in Lt shoulder to be increased 1/2 grade to be able to be able to lift items to shelves that are above shoulder height. Baseline:  Goal status: on going   LONG TERM GOALS: Target date: 05/11/23  PT to be I in HEP in order to  decrease pain in Lt shoulder to no greater than a 1  Goal status: on going  3.  Strength in Lt shoulder to be increased 1 grade to be able to lift items to shelves that are over head  height. Baseline:  Goal status: on going   4.  Pt to be able to complete all housework activity Baseline:  Goal status: on going   5.  Pt to be able to complete all work activity Baseline:  Goal status: on going  PLAN:  PT FREQUENCY: 2x/week  PT DURATION: 6 weeks  PLANNED INTERVENTIONS: Therapeutic exercises, Patient/Family education, Self Care, Joint mobilization, and Manual therapy  PLAN FOR NEXT SESSION: 10th progress visit and recert.  Discuss with pt if she feels she needs more appointments.    Virgina Organ, PT CLT [phone]  05/09/2023, 2:31 PM

## 2023-05-15 ENCOUNTER — Other Ambulatory Visit (HOSPITAL_COMMUNITY): Payer: Self-pay | Admitting: Internal Medicine

## 2023-05-15 ENCOUNTER — Other Ambulatory Visit (HOSPITAL_COMMUNITY): Payer: Self-pay | Admitting: Hematology and Oncology

## 2023-05-15 ENCOUNTER — Encounter (HOSPITAL_COMMUNITY): Payer: Self-pay | Admitting: Hematology and Oncology

## 2023-05-15 ENCOUNTER — Encounter (HOSPITAL_COMMUNITY): Payer: Self-pay | Admitting: Internal Medicine

## 2023-05-15 DIAGNOSIS — N644 Mastodynia: Secondary | ICD-10-CM

## 2023-05-15 DIAGNOSIS — Z1231 Encounter for screening mammogram for malignant neoplasm of breast: Secondary | ICD-10-CM

## 2023-05-16 ENCOUNTER — Encounter (HOSPITAL_COMMUNITY): Payer: Self-pay

## 2023-05-16 ENCOUNTER — Ambulatory Visit (HOSPITAL_COMMUNITY): Payer: 59

## 2023-05-16 DIAGNOSIS — M25612 Stiffness of left shoulder, not elsewhere classified: Secondary | ICD-10-CM | POA: Diagnosis not present

## 2023-05-16 DIAGNOSIS — M25512 Pain in left shoulder: Secondary | ICD-10-CM

## 2023-05-16 DIAGNOSIS — M25611 Stiffness of right shoulder, not elsewhere classified: Secondary | ICD-10-CM

## 2023-05-16 DIAGNOSIS — M6281 Muscle weakness (generalized): Secondary | ICD-10-CM | POA: Diagnosis not present

## 2023-05-16 NOTE — Addendum Note (Signed)
Addended by: Bella Kennedy on: 05/16/2023 11:45 AM   Modules accepted: Orders

## 2023-05-16 NOTE — Therapy (Addendum)
OUTPATIENT PHYSICAL THERAPY SHOULDER treatment   Patient Name: Christine Mason MRN: [id number] DOB:1970-11-11, 52 y.o., female Today's Date: 05/16/2023  END OF SESSION:  PT End of Session - 05/16/23 0807     Visit Number 10    Number of Visits 18    Date for PT Re-Evaluation 06/14/23    Authorization Type [facility]    PT Start Time [id number]    PT Stop Time 0900    PT Time Calculation (min) 50 min    Activity Tolerance Patient tolerated treatment well    Behavior During Therapy [facility] for tasks assessed/performed              Past Medical History:  Diagnosis Date   Anxiety    Arthritis    Back pain    Carpal tunnel syndrome    bi lat hands   Chronic leg pain    right   Chronic pain of left knee    Depression    Diverticulitis    Fatty liver    GERD (gastroesophageal reflux disease)    Heart murmur    High cholesterol    History of kidney stones    Hypertension    Hypothyroidism    Joint pain    Pre-diabetes    Sleep apnea     no C- Pap   Swallowing difficulty    Thyroid cancer [facility])    Past Surgical History:  Procedure Laterality Date   ACNE CYST REMOVAL     BREAST BIOPSY Right 12/22/2022   CHOLECYSTECTOMY     COLONOSCOPY     DILATION AND CURETTAGE OF UTERUS  1998   ESOPHAGOGASTRODUODENOSCOPY (EGD) WITH PROPOFOL N/A 06/13/2019   Procedure: ESOPHAGOGASTRODUODENOSCOPY (EGD) WITH PROPOFOL;  Surgeon: Malissa Hippo, MD;  Location: [facility];  Service: Endoscopy;  Laterality: N/A;  7:30   KNEE SURGERY Left    Meniscus Tear   MALONEY DILATION  06/13/2019   Procedure: MALONEY DILATION;  Surgeon: Malissa Hippo, MD;  Location: [facility];  Service: Endoscopy;;   MASS EXCISION     RADICAL NECK DISSECTION Left 02/28/2023   Procedure: MODIFIED NECK DISSECTION;  Surgeon: Serena Colonel, MD;  Location: [facility] OR;  Service: ENT;  Laterality: Left;   THORACOTOMY Right 2016   THYROIDECTOMY N/A 07/01/2020   Procedure: TOTAL THYROIDECTOMY WITH Central compartment  lymph node disection;  Surgeon: Darnell Level, MD;  Location: [facility] ORS;  Service: General;  Laterality: N/A;   Patient Active Problem List   Diagnosis Date Noted   Thyroid cancer (HCC) 02/28/2023   Multiple thyroid nodules 08/24/2022   Hepatic vein stenosis 08/24/2022   Depression 07/11/2022   Other fatigue 06/27/2022   SOBOE (shortness of breath on exertion) 06/27/2022   Osteoarthritis of both knees 06/27/2022   Pre-diabetes 06/27/2022   Other hyperlipidemia 06/27/2022   Depression screening 06/27/2022   Seasonal and perennial allergic rhinitis 03/08/2022   Wheeze 03/08/2022   Gastroesophageal reflux disease 03/08/2022   Hx of papillary thyroid carcinoma 04/14/2021   Status post total thyroidectomy 04/14/2021   Unilateral primary osteoarthritis, left knee 03/24/2021   Chronic pain of left knee 03/24/2021   Morbid obesity (HCC) 02/09/2021   Abnormal liver function tests 02/09/2021   Chest pain 02/09/2021   Chronic low back pain 02/09/2021   Generalized hyperhidrosis 02/09/2021   Major depressive disorder 02/09/2021   Palpitations 02/09/2021   Urinary incontinence 02/09/2021   Impaired fasting glucose 02/01/2021   Essential hypertension 02/01/2021   Mixed hyperlipidemia 02/01/2021  Vitamin D deficiency 02/01/2021   Hypothyroidism 01/12/2021   Hypokalemia 01/12/2021   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   Neoplasm of uncertain behavior of thyroid gland 06/29/2020   Oropharyngeal dysphagia 05/06/2019   Dysphagia, pharyngoesophageal phase 05/05/2019   Vasomotor symptoms due to menopause 03/13/2019   Genetic testing 06/03/2015   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 Muscle weakness (generalized)      ICD-10-CM: M62.81  4.Stiffness of right shoulder, not elsewhere classified      ICD-10-CM: M25.611     Rationale for Evaluation and Treatment: Rehabilitation  ONSET DATE: 02/28/23  SUBJECTIVE:                                                                                                                                                                                      SUBJECTIVE STATEMENT:  Pt states her Lt shoulder is still hurting at around 6/10 today. Pt is able to get her bra on easier.   Pt stated she continues to have pain in Lt shoulder, pain scale 4/10.  Feels she has improved by 80% with therapy, feels she needs to continue therapy to work on strength.  [name] states that she was not having any problem with her left arm until she had surgery on her neck surgery on 02/28/23.  After the surgery she noted that she has significant decreased use of her Lt arm.  Pt returned to work on 7/15 as NT III (states she sits most of the time) Hand dominance: Right  PERTINENT HISTORY: 2021 thyroidectomy, radical neck dissection with 26 lymphnodes removed on 02/28/23; needs a Lt TKA  PAIN:  Are you having pain? Yes: NPRS scale: 6/10 Pain location: anterior Lt chest went to a cardiologist who stated everything was fine.  Pain description: achy Aggravating factors: constant  Relieving factors: time   PRECAUTIONS: None     WEIGHT BEARING RESTRICTIONS: No  FALLS:  Has patient fallen in last 6 months? No  LIVING ENVIRONMENT: Lives with: lives with their family Lives in: House/apartment OCCUPATION: Nurse tech III mainly sitting   PLOF: Independent  PATIENT GOALS:To be able to use her RT arm again   NEXT MD VISIT: October 3rd, 2024  OBJECTIVE:   PATIENT SURVEYS:  FOTO 44  05/15/23: 51% functional  COGNITION: Overall cognitive status: Within functional limits for tasks assessed  SENSATION: Per pt she is numb in her  face and neck   POSTURE: Rounded shoulder   UPPER EXTREMITY ROM:  supine   Active ROM Right eval Left eval Left AAROM 04/18/23 Left AAROM 05/07/23 Left  AROM/AAROM  Shoulder flexion 145 115 150 170 130/170  Shoulder extension       Shoulder abduction 135 85 125 155 118/155  Shoulder adduction       Shoulder internal rotation wfl Wfl  90 90  Shoulder external rotation 60 50 70 85 90  Elbow flexion       Elbow extension       Wrist flexion       Wrist extension       Wrist ulnar deviation       Wrist radial deviation       Wrist pronation       Wrist supination       (Blank rows = not tested)  UPPER EXTREMITY MMT:  MMT Right eval Left eval Right 05/16/23 Left 05/16/23  Shoulder flexion 5 2+  3/5  Shoulder extension  3  4-/5  Shoulder abduction 4 2+ 4/5 3+/5  Shoulder adduction      Shoulder internal rotation 5 5    Shoulder external rotation 3/5 3/5 4+ 3+/5  Middle trapezius      Lower trapezius      Elbow flexion      Elbow extension      Wrist flexion      Wrist extension      Wrist ulnar deviation      Wrist radial deviation      Wrist pronation      Wrist supination      Grip strength (lbs)      (Blank rows = not tested)       TODAY'S TREATMENT:                                                                                                                                         DATE: 05/15/23: UBE 4' backward level 1 Pulleys flexion/abd 2 minutes each ROM Measurement (see above) MMT (see above) Standing:  Wall slide Supine:  2# wand Flexion/abduction x 10 Side lying ER 2# x 10 Abduction 2#x 10 PROM 05/09/23 Sitting:  Wand flexion  x 10  Wand abduction x 10 ER: 2# x 10  Supine:  2# wand Flexion/abduction x 10 Side lying ER 1# x 10 Abduction 2#x 10  PROM  05/07/23 UBE 4' backward level 1 Pulleys flexion/abd 2 minutes each Finger ladder 5X flexion, 5X abduction Supine:1# cane flexion, abd, ER 10X Sidelying Lt shoulder abd 10X  Lt  scaption 10X AAROM measurements today (see chart above)   05/02/23 Therapeutic exercise x45 min  - R sidelying L Shoulder Abduction to 90*(gravity eliminated) 3x5   - R sideyling L Scaption 3x5  - R sidelying L Sidelying ER  vs 1# 4x5  - Seated stick scaption 3x5  04/25/23 Finger ladder x 5 Attempt  of AROM but not ready Standing palms forward with scapular retraction Sitting:  wand 1# Flexion x 10 Protraction/retraction x 10   2# ER B  AA shoulder abduction x `10  Side-lying  ER: Rt with 2#, Lt 0 x 10 reps   04/19/23 UBE 4' backward level 1 Pulleys flexion/abduction 20X each Sitting:Bil UE flexion 10X  Bil UE abduction (jumping jacks) 10X  Bil UE "flasher"  ER 10X Standing UE flexion (only 90 degrees achieved Lt) Supine: 1# wand for flexion and abduction 10X each  ER with 1# 10X PROM for flexion, abduction and ER  04/18/23 Supine: 1# wand for flexion and abduction 10X each  ER with 1# 10X PROM for flexion, abduction and ER Standing: isometric exercises Lt UE Flexion, abduction and ER x 10 Sitting: pro/retraction x 10 with 1# wand   Bil UE flexion 10X  Bil UE abduction (jumping jacks) 10X  Bil UE "flasher"  ER 10X UBE 4' backward level 1   PATIENT EDUCATION:             Education details: HEP Person educated: Patient Education method: Explanation and Handouts Education comprehension: verbalized understanding and returned demonstration  HOME EXERCISE PROGRAM: Access Code: [email or web address] URL: [email or web address]  Date: 03/30/2023 - Seated Correct Posture  - 5 x daily - 7 x weekly - 1 sets - 10 reps - 5" hold - Seated Scapular Retraction  - 3 x daily - 7 x weekly - 1 sets - 10 reps - 5" hold - Seated Bilateral Shoulder External Rotation with Resistance  - 3 x daily - 7 x weekly - 1 sets - 10 reps - 5" hold - Supine Shoulder Flexion Extension AAROM with Dowel  - 1 x daily - 7 x weekly - 3 sets - 10 reps - Supine Shoulder Abduction AAROM with Dowel  - 1 x  daily - 7 x weekly - 3 sets - 10 reps  04/11/23:  Instructed pt in completing Wand exercises in sitting.              04/12/23:- Isometric Shoulder Flexion at Wall  - 2 x daily - 7 x weekly - 1 sets - 10 reps - 5" hold - Isometric Shoulder External Rotation at Wall  - 2 x daily - 7 x weekly - 1 sets - 10 reps - 5" hold - Isometric Shoulder Abduction at Wall  - 2 x daily - 7 x weekly - 1 sets - 10 reps - 5" hold             04/25/23-- Sidelying Shoulder ER with Towel and Dumbbell  - 2 x daily - 7 x weekly - 1 sets - 10 reps - 3"  hold - Sidelying Shoulder Abduction Full Range of Motion  - 2 x daily - 7 x weekly - 1 sets - 10 reps - 3" hold   05/08/2021:- Seated Shoulder Flexion AAROM with Dowel  - 1 x daily - 7 x weekly - 1 sets - 10 reps - 5" hold - Seated Shoulder Abduction AAROM with Dowel  - 1 x daily - 7 x weekly - 1 sets - 10 reps - 5" hold - Seated Shoulder External Rotation AAROM with Dowel  - 1 x daily - 7 x weekly - 1 sets - 10 reps ASSESSMENT:  CLINICAL IMPRESSION:   10th visit progress note with the following findings:  Pt reports  compliance with HEP 2-3x/week and feels she has improved by 80%.  Presents with improved AA ROM.  Continued to demonstrated weakness Lt shoulder and reports of difficulty lifting to shoulder height and above.  Pt will continue to benefit from skilled PT to address goals unmet to return to previous functional level.  OBJECTIVE IMPAIRMENTS: decreased activity tolerance, decreased strength, and pain.   ACTIVITY LIMITATIONS: carrying, lifting, and reach over head  PARTICIPATION LIMITATIONS: cleaning  PERSONAL FACTORS: Time since onset of injury/illness/exacerbation and 1 comorbidity: OA  are also affecting patient's functional outcome.   REHAB POTENTIAL: Good  CLINICAL DECISION MAKING: Stable/uncomplicated  EVALUATION COMPLEXITY: Low   GOALS: Goals reviewed with patient? No  SHORT TERM GOALS: Target date: 04/20/23  PT to be I in HEP in order to decrease  pain in Lt shoulder to no greater than a 5 Baseline: 05/15/23:  Reports compliance with HEP 2-3x/week, average pain scale 5/10. Goal status: MET  2.  ROM in Lt shoulder to be increased 20 degrees to be able to lift items to shelves that are above shoulder height Baseline: 05/15/23:  see ROM, continues to present with weakness with increased difficulty lifting items to shoulder height Goal status: on going   3.   Strength in Lt shoulder to be increased 1/2 grade to be able to be able to lift items to shelves that are above shoulder height. Baseline: 05/15/23: see MMT Goal status: MET   LONG TERM GOALS: Target date: 05/11/23  PT to be I in HEP in order to decrease pain in Lt shoulder to no greater than a 1  Baseline:  Goal status: on going  2.  Strength in Lt shoulder to be increased 1 grade to be able to lift items to shelves that are over head  height. Baseline:  see above Goal status: on going   3.  Pt to be able to complete all housework activity Baseline: 05/15/23:  Reports ability to complete all housework activities, still has difficulty reaching shoulder height and above Goal status: on going   5.  Pt to be able to complete all work activity Baseline: 05/15/23:  Continues to have difficulty reaching  Goal status: on going  PLAN:  PT FREQUENCY: 2x/week  PT DURATION: 6 weeks  PLANNED INTERVENTIONS: Therapeutic exercises, Patient/Family education, Self Care, Joint mobilization, and Manual therapy  PLAN FOR NEXT SESSION: Continue 2x/week for 4 additional weeks to address shoulder weakness.   Becky Sax, LPTA/CLT; CBIS [phone]  Virgina Organ, PT CLT [phone]  05/16/2023, 11:39 AM

## 2023-05-16 NOTE — Addendum Note (Signed)
Addended by: Bella Kennedy on: 05/16/2023 05:44 PM   Modules accepted: Orders

## 2023-05-25 ENCOUNTER — Encounter (HOSPITAL_COMMUNITY): Payer: 59 | Admitting: Physical Therapy

## 2023-05-28 ENCOUNTER — Encounter (HOSPITAL_COMMUNITY): Payer: 59 | Admitting: Physical Therapy

## 2023-06-05 ENCOUNTER — Ambulatory Visit (HOSPITAL_COMMUNITY)
Admission: RE | Admit: 2023-06-05 | Discharge: 2023-06-05 | Disposition: A | Discharge status: Home / Self Care | Payer: 59 | Source: Ambulatory Visit | Attending: Hematology and Oncology | Admitting: Hematology and Oncology

## 2023-06-05 ENCOUNTER — Ambulatory Visit (HOSPITAL_COMMUNITY)
Admission: RE | Admit: 2023-06-05 | Discharge: 2023-06-05 | Disposition: A | Discharge status: Home / Self Care | Payer: 59 | Source: Ambulatory Visit | Attending: Hematology and Oncology

## 2023-06-05 ENCOUNTER — Encounter (HOSPITAL_COMMUNITY): Payer: Self-pay

## 2023-06-05 ENCOUNTER — Encounter (HOSPITAL_COMMUNITY): Payer: Self-pay | Admitting: Physical Therapy

## 2023-06-05 ENCOUNTER — Ambulatory Visit (HOSPITAL_COMMUNITY): Payer: 59 | Attending: Otolaryngology | Admitting: Physical Therapy

## 2023-06-05 DIAGNOSIS — M25612 Stiffness of left shoulder, not elsewhere classified: Secondary | ICD-10-CM | POA: Diagnosis not present

## 2023-06-05 DIAGNOSIS — M6281 Muscle weakness (generalized): Secondary | ICD-10-CM | POA: Diagnosis not present

## 2023-06-05 DIAGNOSIS — N644 Mastodynia: Secondary | ICD-10-CM | POA: Diagnosis not present

## 2023-06-05 DIAGNOSIS — M25611 Stiffness of right shoulder, not elsewhere classified: Secondary | ICD-10-CM | POA: Diagnosis not present

## 2023-06-05 DIAGNOSIS — Z803 Family history of malignant neoplasm of breast: Secondary | ICD-10-CM | POA: Diagnosis not present

## 2023-06-05 DIAGNOSIS — M25512 Pain in left shoulder: Secondary | ICD-10-CM | POA: Insufficient documentation

## 2023-06-05 DIAGNOSIS — R92313 Mammographic fatty tissue density, bilateral breasts: Secondary | ICD-10-CM | POA: Diagnosis not present

## 2023-06-05 NOTE — Therapy (Signed)
OUTPATIENT PHYSICAL THERAPY SHOULDER treatment   Patient Name: Christine Mason MRN: [id number] DOB:24-Mar-1971, 52 y.o., female Today's Date: 06/05/2023  END OF SESSION:  PT End of Session - 06/05/23 0801     Visit Number 11    Number of Visits 18    Date for PT Re-Evaluation 06/14/23    Authorization Type [facility]    PT Start Time 0801    PT Stop Time 0840    PT Time Calculation (min) 39 min    Activity Tolerance Patient tolerated treatment well    Behavior During Therapy WFL for tasks assessed/performed              Past Medical History:  Diagnosis Date   Anxiety    Arthritis    Back pain    Carpal tunnel syndrome    bi lat hands   Chronic leg pain    right   Chronic pain of left knee    Depression    Diverticulitis    Fatty liver    GERD (gastroesophageal reflux disease)    Heart murmur    High cholesterol    History of kidney stones    Hypertension    Hypothyroidism    Joint pain    Pre-diabetes    Sleep apnea     no C- Pap   Swallowing difficulty    Thyroid cancer [facility])    Past Surgical History:  Procedure Laterality Date   ACNE CYST REMOVAL     BREAST BIOPSY Right 12/22/2022   CHOLECYSTECTOMY     COLONOSCOPY     DILATION AND CURETTAGE OF UTERUS  1998   ESOPHAGOGASTRODUODENOSCOPY (EGD) WITH PROPOFOL N/A 06/13/2019   Procedure: ESOPHAGOGASTRODUODENOSCOPY (EGD) WITH PROPOFOL;  Surgeon: Malissa Hippo, MD;  Location: [facility];  Service: Endoscopy;  Laterality: N/A;  7:30   KNEE SURGERY Left    Meniscus Tear   MALONEY DILATION  06/13/2019   Procedure: MALONEY DILATION;  Surgeon: Malissa Hippo, MD;  Location: [facility];  Service: Endoscopy;;   MASS EXCISION     RADICAL NECK DISSECTION Left 02/28/2023   Procedure: MODIFIED NECK DISSECTION;  Surgeon: Serena Colonel, MD;  Location: [facility] OR;  Service: ENT;  Laterality: Left;   THORACOTOMY Right 2016   THYROIDECTOMY N/A 07/01/2020   Procedure: TOTAL THYROIDECTOMY WITH Central compartment  lymph node disection;  Surgeon: Darnell Level, MD;  Location: [facility] ORS;  Service: General;  Laterality: N/A;   Patient Active Problem List   Diagnosis Date Noted   Thyroid cancer (HCC) 02/28/2023   Multiple thyroid nodules 08/24/2022   Hepatic vein stenosis 08/24/2022   Depression 07/11/2022   Other fatigue 06/27/2022   SOBOE (shortness of breath on exertion) 06/27/2022   Osteoarthritis of both knees 06/27/2022   Pre-diabetes 06/27/2022   Other hyperlipidemia 06/27/2022   Depression screening 06/27/2022   Seasonal and perennial allergic rhinitis 03/08/2022   Wheeze 03/08/2022   Gastroesophageal reflux disease 03/08/2022   Hx of papillary thyroid carcinoma 04/14/2021   Status post total thyroidectomy 04/14/2021   Unilateral primary osteoarthritis, left knee 03/24/2021   Chronic pain of left knee 03/24/2021   Morbid obesity (HCC) 02/09/2021   Abnormal liver function tests 02/09/2021   Chest pain 02/09/2021   Chronic low back pain 02/09/2021   Generalized hyperhidrosis 02/09/2021   Major depressive disorder 02/09/2021   Palpitations 02/09/2021   Urinary incontinence 02/09/2021   Impaired fasting glucose 02/01/2021   Essential hypertension 02/01/2021   Mixed hyperlipidemia 02/01/2021  Vitamin D deficiency 02/01/2021   Hypothyroidism 01/12/2021   Hypokalemia 01/12/2021   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   Neoplasm of uncertain behavior of thyroid gland 06/29/2020   Oropharyngeal dysphagia 05/06/2019   Dysphagia, pharyngoesophageal phase 05/05/2019   Vasomotor symptoms due to menopause 03/13/2019   Genetic testing 06/03/2015   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 Muscle weakness (generalized)      ICD-10-CM: M62.81  4.Stiffness of right shoulder, not elsewhere classified      ICD-10-CM: M25.611     Rationale for Evaluation and Treatment: Rehabilitation  ONSET DATE: 02/28/23  SUBJECTIVE:                                                                                                                                                                                      SUBJECTIVE STATEMENT:  Pt states she has been sick and working. Pt states shoulder has been tender at times. Pt is concerned about inflammation in her shoulder area. Pt reports she has not been consistent enough with her exercises.   EVAL: [name] states that she was not having any problem with her left arm until she had surgery on her neck surgery on 02/28/23.  After the surgery she noted that she has significant decreased use of her Lt arm.  Pt returned to work on 7/15 as NT III (states she sits most of the time) Hand dominance: Right  PERTINENT HISTORY: 2021 thyroidectomy, radical neck dissection with 26 lymphnodes removed on 02/28/23; needs a Lt TKA  PAIN:  Are you having pain? Yes: NPRS scale: 6/10 Pain location: anterior Lt chest went to a cardiologist who stated everything was fine.  Pain description: achy Aggravating factors: constant  Relieving factors: time   PRECAUTIONS: None     WEIGHT BEARING RESTRICTIONS: No  FALLS:  Has patient fallen in last 6 months? No  LIVING ENVIRONMENT: Lives with: lives with their family Lives in: House/apartment OCCUPATION: Nurse tech III mainly sitting   PLOF: Independent  PATIENT GOALS:To be able to use her RT arm again   NEXT MD VISIT: October 3rd, 2024  OBJECTIVE:   PATIENT SURVEYS:  FOTO 44  05/15/23: 51% functional  COGNITION: Overall cognitive status: Within functional limits for tasks assessed     SENSATION: Per pt she is numb in her face and neck   POSTURE:  Rounded shoulder   UPPER  EXTREMITY ROM:  supine   Active ROM Right eval Left eval Left AAROM 04/18/23 Left AAROM 05/07/23 Left  AROM/AAROM  Shoulder flexion 145 115 150 170 130/170  Shoulder extension       Shoulder abduction 135 85 125 155 118/155  Shoulder adduction       Shoulder internal rotation wfl Wfl  90 90  Shoulder external rotation 60 50 70 85 90  Elbow flexion       Elbow extension       Wrist flexion       Wrist extension       Wrist ulnar deviation       Wrist radial deviation       Wrist pronation       Wrist supination       (Blank rows = not tested)  UPPER EXTREMITY MMT:  MMT Right eval Left eval Right 05/16/23 Left 05/16/23  Shoulder flexion 5 2+  3/5  Shoulder extension  3  4-/5  Shoulder abduction 4 2+ 4/5 3+/5  Shoulder adduction      Shoulder internal rotation 5 5    Shoulder external rotation 3/5 3/5 4+ 3+/5  Middle trapezius      Lower trapezius      Elbow flexion      Elbow extension      Wrist flexion      Wrist extension      Wrist ulnar deviation      Wrist radial deviation      Wrist pronation      Wrist supination      Grip strength (lbs)      (Blank rows = not tested)       TODAY'S TREATMENT:                                                                                                                                         DATE: 06/05/23: UBE Level 2, 3 min fwd, 3 min bwd Standing  Doorway pec stretch low, mid, high x30 sec each Manual therapy  STM & TPR L UT, L pec  Skilled assessment and palpation for TPDN  Trigger Point Dry-Needling  Treatment instructions: Expect mild to moderate muscle soreness. S/S of pneumothorax if dry needled over a lung field, and to seek immediate medical attention should they occur. Patient verbalized understanding of these instructions and education.  Patient Consent Given: Yes Education handout provided: Yes Muscles treated: L UT Electrical stimulation performed: No Parameters: N/A Treatment response/outcome:  Decreased muscle tension Grade II and III thoracic CPAs Scar massage Prone  Scapular retraction with shoulder ext 2x10  Row x10  Scap retraction 2x10 Seated  Shoulder ER with scap squeeze x10 Self care: Scar massage with vitamin E lotion if mederma is causing skin irritation, self massage, postural stabilization   05/15/23: UBE 4' backward level 1 Pulleys flexion/abd  2 minutes each ROM Measurement (see above) MMT (see above) Standing:  Wall slide Supine:  2# wand Flexion/abduction x 10 Side lying ER 2# x 10 Abduction 2#x 10 PROM  05/09/23 Sitting:  Wand flexion  x 10  Wand abduction x 10 ER: 2# x 10  Supine:  2# wand Flexion/abduction x 10 Side lying ER 1# x 10 Abduction 2#x 10  PROM  05/07/23 UBE 4' backward level 1 Pulleys flexion/abd 2 minutes each Finger ladder 5X flexion, 5X abduction Supine:1# cane flexion, abd, ER 10X Sidelying Lt shoulder abd 10X  Lt scaption 10X AAROM measurements today (see chart above)   05/02/23 Therapeutic exercise x45 min  - R sidelying L Shoulder Abduction to 90*(gravity eliminated) 3x5   - R sideyling L Scaption 3x5  - R sidelying L Sidelying ER vs 1# 4x5  - Seated stick scaption 3x5  04/25/23 Finger ladder x 5 Attempt  of AROM but not ready Standing palms forward with scapular retraction Sitting:  wand 1# Flexion x 10 Protraction/retraction x 10   2# ER B  AA shoulder abduction x `10  Side-lying  ER: Rt with 2#, Lt 0 x 10 reps   04/19/23 UBE 4' backward level 1 Pulleys flexion/abduction 20X each Sitting:Bil UE flexion 10X  Bil UE abduction (jumping jacks) 10X  Bil UE "flasher"  ER 10X Standing UE flexion (only 90 degrees achieved Lt) Supine: 1# wand for flexion and abduction 10X each  ER with 1# 10X PROM for flexion, abduction and ER  04/18/23 Supine: 1# wand for flexion and abduction 10X each  ER with 1# 10X PROM for flexion, abduction and ER Standing: isometric exercises Lt UE Flexion, abduction and ER x  10 Sitting: pro/retraction x 10 with 1# wand   Bil UE flexion 10X  Bil UE abduction (jumping jacks) 10X  Bil UE "flasher"  ER 10X UBE 4' backward level 1   PATIENT EDUCATION:             Education details: HEP Person educated: Patient Education method: Explanation and Handouts Education comprehension: verbalized understanding and returned demonstration  HOME EXERCISE PROGRAM: Access Code: [email or web address] URL: [email or web address]  Date: 03/30/2023 - Seated Correct Posture  - 5 x daily - 7 x weekly - 1 sets - 10 reps - 5" hold - Seated Scapular Retraction  - 3 x daily - 7 x weekly - 1 sets - 10 reps - 5" hold - Seated Bilateral Shoulder External Rotation with Resistance  - 3 x daily - 7 x weekly - 1 sets - 10 reps - 5" hold - Supine Shoulder Flexion Extension AAROM with Dowel  - 1 x daily - 7 x weekly - 3 sets - 10 reps - Supine Shoulder Abduction AAROM with Dowel  - 1 x daily - 7 x weekly - 3 sets - 10 reps  04/11/23:  Instructed pt in completing Wand exercises in sitting.              04/12/23:- Isometric Shoulder Flexion at Wall  - 2 x daily - 7 x weekly - 1 sets - 10 reps - 5" hold - Isometric Shoulder External Rotation at Wall  - 2 x daily - 7 x weekly - 1 sets - 10 reps - 5" hold - Isometric Shoulder Abduction at Wall  - 2 x daily - 7 x weekly - 1 sets - 10 reps - 5" hold             04/25/23-- Sidelying Shoulder ER with  Towel and Dumbbell  - 2 x daily - 7 x weekly - 1 sets - 10 reps - 3"  hold - Sidelying Shoulder Abduction Full Range of Motion  - 2 x daily - 7 x weekly - 1 sets - 10 reps - 3" hold   05/08/2021:- Seated Shoulder Flexion AAROM with Dowel  - 1 x daily - 7 x weekly - 1 sets - 10 reps - 5" hold - Seated Shoulder Abduction AAROM with Dowel  - 1 x daily - 7 x weekly - 1 sets - 10 reps - 5" hold - Seated Shoulder External Rotation AAROM with Dowel  - 1 x daily - 7 x weekly - 1 sets - 10 reps ASSESSMENT:  CLINICAL IMPRESSION:   Pt demos very weak periscapular  muscles. Difficulty performing scapular squeeze without UT compensation in prone. Performed trial of TPDN for her L UT soreness and pain. Focused on posterior shoulder girdle this session and pec stretching. Discussed self massage for scar and her pecs to reduce anterior shoulder tension as well as paying attention to upper trap compensation  OBJECTIVE IMPAIRMENTS: decreased activity tolerance, decreased strength, and pain.   ACTIVITY LIMITATIONS: carrying, lifting, and reach over head  PARTICIPATION LIMITATIONS: cleaning  PERSONAL FACTORS: Time since onset of injury/illness/exacerbation and 1 comorbidity: OA  are also affecting patient's functional outcome.   REHAB POTENTIAL: Good  CLINICAL DECISION MAKING: Stable/uncomplicated  EVALUATION COMPLEXITY: Low   GOALS: Goals reviewed with patient? No  SHORT TERM GOALS: Target date: 04/20/23  PT to be I in HEP in order to decrease pain in Lt shoulder to no greater than a 5 Baseline: 05/15/23:  Reports compliance with HEP 2-3x/week, average pain scale 5/10. Goal status: MET  2.  ROM in Lt shoulder to be increased 20 degrees to be able to lift items to shelves that are above shoulder height Baseline: 05/15/23:  see ROM, continues to present with weakness with increased difficulty lifting items to shoulder height Goal status: on going   3.   Strength in Lt shoulder to be increased 1/2 grade to be able to be able to lift items to shelves that are above shoulder height. Baseline: 05/15/23: see MMT Goal status: MET   LONG TERM GOALS: Target date: 06/14/23  PT to be I in HEP in order to decrease pain in Lt shoulder to no greater than a 1  Baseline:  Goal status: on going  2.  Strength in Lt shoulder to be increased 1 grade to be able to lift items to shelves that are over head  height. Baseline:  see above Goal status: on going   3.  Pt to be able to complete all housework activity Baseline: 05/15/23:  Reports ability to complete all  housework activities, still has difficulty reaching shoulder height and above Goal status: on going   5.  Pt to be able to complete all work activity Baseline: 05/15/23:  Continues to have difficulty reaching  Goal status: on going  PLAN:  PT FREQUENCY: 2x/week  PT DURATION: 6 weeks  PLANNED INTERVENTIONS: Therapeutic exercises, Patient/Family education, Self Care, Joint mobilization, and Manual therapy  PLAN FOR NEXT SESSION: How was dry needling? Manual work and stretching as indicated for UT, pecs. Continue scapular strengthening and rotator cuff strengthening.    Albertina Leise April Ma L Danyal Adorno, PT 06/05/2023, 8:01 AM

## 2023-06-08 ENCOUNTER — Ambulatory Visit: Payer: 59 | Admitting: Internal Medicine

## 2023-06-08 ENCOUNTER — Encounter (HOSPITAL_COMMUNITY): Payer: 59 | Admitting: Physical Therapy

## 2023-06-09 ENCOUNTER — Other Ambulatory Visit: Payer: Self-pay | Admitting: Cardiology

## 2023-06-09 ENCOUNTER — Other Ambulatory Visit (HOSPITAL_COMMUNITY): Payer: Self-pay

## 2023-06-09 DIAGNOSIS — I10 Essential (primary) hypertension: Secondary | ICD-10-CM

## 2023-06-11 ENCOUNTER — Other Ambulatory Visit: Payer: Self-pay

## 2023-06-11 ENCOUNTER — Other Ambulatory Visit (HOSPITAL_COMMUNITY): Payer: Self-pay

## 2023-06-11 ENCOUNTER — Other Ambulatory Visit: Payer: Self-pay | Admitting: Internal Medicine

## 2023-06-11 MED ORDER — SPIRONOLACTONE-HCTZ 25-25 MG PO TABS
1.0000 | ORAL_TABLET | ORAL | 11 refills | Status: DC
Start: 2023-06-11 — End: 2024-05-25
  Filled 2023-06-11: qty 30, 30d supply, fill #0
  Filled 2023-07-11: qty 30, 30d supply, fill #1
  Filled 2023-08-10: qty 30, 30d supply, fill #2
  Filled 2023-09-04: qty 30, 30d supply, fill #3
  Filled 2023-10-04: qty 30, 30d supply, fill #4
  Filled 2023-11-03: qty 30, 30d supply, fill #5
  Filled 2023-12-01: qty 30, 30d supply, fill #6
  Filled 2024-01-05: qty 30, 30d supply, fill #7
  Filled 2024-02-01: qty 30, 30d supply, fill #8
  Filled 2024-03-01: qty 30, 30d supply, fill #9
  Filled 2024-04-01: qty 30, 30d supply, fill #10
  Filled 2024-04-27: qty 30, 30d supply, fill #11

## 2023-06-12 ENCOUNTER — Ambulatory Visit (HOSPITAL_COMMUNITY): Payer: 59 | Admitting: Physical Therapy

## 2023-06-12 ENCOUNTER — Other Ambulatory Visit: Payer: Self-pay | Admitting: Allergy & Immunology

## 2023-06-12 ENCOUNTER — Other Ambulatory Visit: Payer: Self-pay | Admitting: General Surgery

## 2023-06-12 DIAGNOSIS — N644 Mastodynia: Secondary | ICD-10-CM | POA: Diagnosis not present

## 2023-06-12 DIAGNOSIS — Z1589 Genetic susceptibility to other disease: Secondary | ICD-10-CM | POA: Diagnosis not present

## 2023-06-12 DIAGNOSIS — M25611 Stiffness of right shoulder, not elsewhere classified: Secondary | ICD-10-CM | POA: Diagnosis not present

## 2023-06-12 DIAGNOSIS — C73 Malignant neoplasm of thyroid gland: Secondary | ICD-10-CM | POA: Diagnosis not present

## 2023-06-12 DIAGNOSIS — M6281 Muscle weakness (generalized): Secondary | ICD-10-CM | POA: Diagnosis not present

## 2023-06-12 DIAGNOSIS — M25512 Pain in left shoulder: Secondary | ICD-10-CM | POA: Diagnosis not present

## 2023-06-12 DIAGNOSIS — M25612 Stiffness of left shoulder, not elsewhere classified: Secondary | ICD-10-CM | POA: Diagnosis not present

## 2023-06-12 DIAGNOSIS — Z803 Family history of malignant neoplasm of breast: Secondary | ICD-10-CM | POA: Diagnosis not present

## 2023-06-12 NOTE — Therapy (Signed)
OUTPATIENT PHYSICAL THERAPY SHOULDER treatment   Patient Name: Christine Mason MRN: [id number] DOB:30-Nov-1970, 52 y.o., female Today's Date: 06/12/2023  END OF SESSION:  PT End of Session - 06/12/23 1333     Visit Number 12    Number of Visits 18    Date for PT Re-Evaluation 06/14/23    Authorization Type [facility]    PT Start Time 1340    PT Stop Time 1420    PT Time Calculation (min) 40 min    Activity Tolerance Patient tolerated treatment well    Behavior During Therapy WFL for tasks assessed/performed             Past Medical History:  Diagnosis Date   Anxiety    Arthritis    Back pain    Carpal tunnel syndrome    bi lat hands   Chronic leg pain    right   Chronic pain of left knee    Depression    Diverticulitis    Fatty liver    GERD (gastroesophageal reflux disease)    Heart murmur    High cholesterol    History of kidney stones    Hypertension    Hypothyroidism    Joint pain    Pre-diabetes    Sleep apnea     no C- Pap   Swallowing difficulty    Thyroid cancer [facility])    Past Surgical History:  Procedure Laterality Date   ACNE CYST REMOVAL     BREAST BIOPSY Right 12/22/2022   AGGREGATED APOCRINE CYSTS- NEGATIVE FOR MALIGNANCY of the RIGHT breast   CHOLECYSTECTOMY     COLONOSCOPY     DILATION AND CURETTAGE OF UTERUS  1998   ESOPHAGOGASTRODUODENOSCOPY (EGD) WITH PROPOFOL N/A 06/13/2019   Procedure: ESOPHAGOGASTRODUODENOSCOPY (EGD) WITH PROPOFOL;  Surgeon: Christine Hippo, MD;  Location: [facility];  Service: Endoscopy;  Laterality: N/A;  7:30   KNEE SURGERY Left    Meniscus Tear   MALONEY DILATION  06/13/2019   Procedure: MALONEY DILATION;  Surgeon: Christine Hippo, MD;  Location: [facility];  Service: Endoscopy;;   MASS EXCISION     RADICAL NECK DISSECTION Left 02/28/2023   Procedure: MODIFIED NECK DISSECTION;  Surgeon: Christine Colonel, MD;  Location: [facility] OR;  Service: ENT;  Laterality: Left;   THORACOTOMY Right 2016   THYROIDECTOMY N/A  07/01/2020   Procedure: TOTAL THYROIDECTOMY WITH Central compartment lymph node disection;  Surgeon: Christine Level, MD;  Location: [facility]S;  Service: General;  Laterality: N/A;   Patient Active Problem List   Diagnosis Date Noted   Thyroid cancer (HCC) 02/28/2023   Multiple thyroid nodules 08/24/2022   Hepatic vein stenosis 08/24/2022   Depression 07/11/2022   Other fatigue 06/27/2022   SOBOE (shortness of breath on exertion) 06/27/2022   Osteoarthritis of both knees 06/27/2022   Pre-diabetes 06/27/2022   Other hyperlipidemia 06/27/2022   Depression screening 06/27/2022   Seasonal and perennial allergic rhinitis 03/08/2022   Wheeze 03/08/2022   Gastroesophageal reflux disease 03/08/2022   Hx of papillary thyroid carcinoma 04/14/2021   Status post total thyroidectomy 04/14/2021   Unilateral primary osteoarthritis, left knee 03/24/2021   Chronic pain of left knee 03/24/2021   Morbid obesity (HCC) 02/09/2021   Abnormal liver function tests 02/09/2021   Chest pain 02/09/2021   Chronic low back pain 02/09/2021   Generalized hyperhidrosis 02/09/2021   Major depressive disorder 02/09/2021   Palpitations 02/09/2021   Urinary incontinence 02/09/2021   Impaired fasting glucose 02/01/2021  Essential hypertension 02/01/2021   Mixed hyperlipidemia 02/01/2021   Vitamin D deficiency 02/01/2021   Hypothyroidism 01/12/2021   Hypokalemia 01/12/2021   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   Neoplasm of uncertain behavior of thyroid gland 06/29/2020   Oropharyngeal dysphagia 05/06/2019   Dysphagia, pharyngoesophageal phase 05/05/2019   Vasomotor symptoms due to menopause 03/13/2019   Genetic testing 06/03/2015   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 Muscle weakness (generalized)      ICD-10-CM: M62.81  4.Stiffness of right shoulder, not elsewhere classified      ICD-10-CM: M25.611     Rationale for Evaluation and Treatment: Rehabilitation  ONSET DATE: 02/28/23  SUBJECTIVE:                                                                                                                                                                                      SUBJECTIVE STATEMENT:  Pt states she has been hurting with work. 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 Pt reports she's been stressed at work. States she thinks the TPDN helped. Pt states she saw surgeon this afternoon. Pt will be getting double masectomy.  EVAL: [name] states that she was not having any problem with her left arm until she had surgery on her neck surgery on 02/28/23.  After the surgery she noted that she has significant decreased use of her Lt arm.  Pt returned to work on 7/15 as NT III (states she sits most of the time) Hand dominance: Right  PERTINENT HISTORY: 2021 thyroidectomy, radical neck dissection with 26 lymphnodes removed on 02/28/23; needs a Lt TKA  PAIN:  Are you having pain? Yes: NPRS scale: 6/10 Pain location: anterior Lt chest went to a cardiologist who stated everything was fine.  Pain description: achy Aggravating factors: constant  Relieving factors: time   PRECAUTIONS: None     WEIGHT BEARING RESTRICTIONS: No  FALLS:  Has patient fallen in last 6 months? No  LIVING ENVIRONMENT: Lives with: lives with their family Lives in: House/apartment OCCUPATION: Nurse tech III mainly sitting   PLOF: Independent  PATIENT GOALS:To be able to use her RT arm again   NEXT MD VISIT: October 3rd, 2024  OBJECTIVE:   PATIENT SURVEYS:  FOTO 44  05/15/23: 51% functional  COGNITION: Overall cognitive status: Within  functional limits for tasks assessed     SENSATION: Per pt she is numb in her face and neck   POSTURE: Rounded shoulder   UPPER EXTREMITY ROM:  supine   Active ROM Right eval Left eval Left AAROM 04/18/23 Left AAROM 05/07/23 Left  AROM/AAROM  Shoulder flexion 145 115 150 170 130/170  Shoulder extension       Shoulder abduction 135 85 125 155 118/155  Shoulder adduction       Shoulder internal rotation wfl Wfl  90 90  Shoulder external rotation 60 50 70 85 90  Elbow flexion       Elbow extension       Wrist flexion       Wrist extension       Wrist ulnar deviation       Wrist radial deviation       Wrist pronation       Wrist supination       (Blank rows = not tested)  UPPER EXTREMITY MMT:  MMT Right eval Left eval Right 05/16/23 Left 05/16/23  Shoulder flexion 5 2+  3/5  Shoulder extension  3  4-/5  Shoulder abduction 4 2+ 4/5 3+/5  Shoulder adduction      Shoulder internal rotation 5 5    Shoulder external rotation 3/5 3/5 4+ 3+/5  Middle trapezius      Lower trapezius      Elbow flexion      Elbow extension      Wrist flexion      Wrist extension      Wrist ulnar deviation      Wrist radial deviation      Wrist pronation      Wrist supination      Grip strength (lbs)      (Blank rows = not tested)     TODAY'S TREATMENT:                                                                                                                                         DATE: 06/12/23: Seated  Pulleys flexion x 1 min, abd x 1 min, horizontal abd x1 min  Scap squeeze 10x3"  "W" 10x3" Standing  Doorway pec stretch low, mid, high x30 sec each  Manual therapy  STM & TPR L pec minor/major  Skilled assessment and palpation for TPDN  Trigger Point Dry-Needling  Treatment instructions: Expect mild to moderate muscle soreness. S/S of pneumothorax if dry needled over a lung field, and to seek immediate medical attention should they occur. Patient verbalized understanding of  these instructions and education.  Patient Consent Given: Yes Education handout provided: Yes Muscles treated: L pec Electrical stimulation performed: No Parameters: N/A Treatment response/outcome: Decreased muscle tension Supine  Shoulder ER at 80 deg shoulder abd with 1# 2x10  Shoulder flexion 2x5 with 10 sec hold  Shoulder scaption 1# 2x10  Hand behind head pec stretch x10  06/05/23: UBE Mason 2, 3 min fwd, 3 min bwd Standing  Doorway pec stretch low, mid, high x30 sec each Manual therapy  STM & TPR L UT, L pec  Skilled assessment and palpation for TPDN  Trigger Point Dry-Needling  Treatment instructions: Expect mild to moderate muscle soreness. S/S of pneumothorax if dry needled over a lung field, and to seek immediate medical attention should they occur. Patient verbalized understanding of these instructions and education.  Patient Consent Given: Yes Education handout provided: Yes Muscles treated: L UT Electrical stimulation performed: No Parameters: N/A Treatment response/outcome: Decreased muscle tension Grade II and III thoracic CPAs Scar massage Prone  Scapular retraction with shoulder ext 2x10  Row x10  Scap retraction 2x10 Seated  Shoulder ER with scap squeeze x10 Self care: Scar massage with vitamin E lotion if mederma is causing skin irritation, self massage, postural stabilization   05/15/23: UBE 4' backward Mason 1 Pulleys flexion/abd 2 minutes each ROM Measurement (see above) MMT (see above) Standing:  Wall slide Supine:  2# wand Flexion/abduction x 10 Side lying ER 2# x 10 Abduction 2#x 10 PROM  05/09/23 Sitting:  Wand flexion  x 10  Wand abduction x 10 ER: 2# x 10  Supine:  2# wand Flexion/abduction x 10 Side lying ER 1# x 10 Abduction 2#x 10  PROM  05/07/23 UBE 4' backward Mason 1 Pulleys flexion/abd 2 minutes each Finger ladder 5X flexion, 5X abduction Supine:1# cane flexion, abd, ER 10X Sidelying Lt shoulder abd 10X  Lt scaption  10X AAROM measurements today (see chart above)   05/02/23 Therapeutic exercise x45 min  - R sidelying L Shoulder Abduction to 90*(gravity eliminated) 3x5   - R sideyling L Scaption 3x5  - R sidelying L Sidelying ER vs 1# 4x5  - Seated stick scaption 3x5  04/25/23 Finger ladder x 5 Attempt  of AROM but not ready Standing palms forward with scapular retraction Sitting:  wand 1# Flexion x 10 Protraction/retraction x 10   2# ER B  AA shoulder abduction x `10  Side-lying  ER: Rt with 2#, Lt 0 x 10 reps   04/19/23 UBE 4' backward Mason 1 Pulleys flexion/abduction 20X each Sitting:Bil UE flexion 10X  Bil UE abduction (jumping jacks) 10X  Bil UE "flasher"  ER 10X Standing UE flexion (only 90 degrees achieved Lt) Supine: 1# wand for flexion and abduction 10X each  ER with 1# 10X PROM for flexion, abduction and ER  04/18/23 Supine: 1# wand for flexion and abduction 10X each  ER with 1# 10X PROM for flexion, abduction and ER Standing: isometric exercises Lt UE Flexion, abduction and ER x 10 Sitting: pro/retraction x 10 with 1# wand   Bil UE flexion 10X  Bil UE abduction (jumping jacks) 10X  Bil UE "flasher"  ER 10X UBE 4' backward Mason 1   PATIENT EDUCATION:             Education details: HEP Person educated: Patient Education method: Explanation and Handouts Education comprehension: verbalized understanding and returned demonstration  HOME EXERCISE PROGRAM: Access Code: [email or web address] URL: [email or web address]  Date: 03/30/2023 - Seated Correct Posture  - 5 x daily - 7 x weekly - 1 sets - 10 reps - 5" hold - Seated Scapular Retraction  - 3 x daily - 7 x weekly - 1 sets - 10 reps - 5" hold - Seated Bilateral Shoulder External Rotation with Resistance  - 3 x daily - 7 x weekly -  1 sets - 10 reps - 5" hold - Supine Shoulder Flexion Extension AAROM with Dowel  - 1 x daily - 7 x weekly - 3 sets - 10 reps - Supine Shoulder Abduction AAROM with Dowel  - 1 x daily - 7 x  weekly - 3 sets - 10 reps  04/11/23:  Instructed pt in completing Wand exercises in sitting.              04/12/23:- Isometric Shoulder Flexion at Wall  - 2 x daily - 7 x weekly - 1 sets - 10 reps - 5" hold - Isometric Shoulder External Rotation at Wall  - 2 x daily - 7 x weekly - 1 sets - 10 reps - 5" hold - Isometric Shoulder Abduction at Wall  - 2 x daily - 7 x weekly - 1 sets - 10 reps - 5" hold             04/25/23-- Sidelying Shoulder ER with Towel and Dumbbell  - 2 x daily - 7 x weekly - 1 sets - 10 reps - 3"  hold - Sidelying Shoulder Abduction Full Range of Motion  - 2 x daily - 7 x weekly - 1 sets - 10 reps - 3" hold   05/08/2021:- Seated Shoulder Flexion AAROM with Dowel  - 1 x daily - 7 x weekly - 1 sets - 10 reps - 5" hold - Seated Shoulder Abduction AAROM with Dowel  - 1 x daily - 7 x weekly - 1 sets - 10 reps - 5" hold - Seated Shoulder External Rotation AAROM with Dowel  - 1 x daily - 7 x weekly - 1 sets - 10 reps ASSESSMENT:  CLINICAL IMPRESSION:   Performed TPDN to address her tight and painful L pecs. Worked on periscapular strengthening as tolerated as well as overhead movements. Will need to recheck ROM and re-cert next visit.   OBJECTIVE IMPAIRMENTS: decreased activity tolerance, decreased strength, and pain.   ACTIVITY LIMITATIONS: carrying, lifting, and reach over head  PARTICIPATION LIMITATIONS: cleaning  PERSONAL FACTORS: Time since onset of injury/illness/exacerbation and 1 comorbidity: OA  are also affecting patient's functional outcome.   REHAB POTENTIAL: Good  CLINICAL DECISION MAKING: Stable/uncomplicated  EVALUATION COMPLEXITY: Low   GOALS: Goals reviewed with patient? No  SHORT TERM GOALS: Target date: 04/20/23  PT to be I in HEP in order to decrease pain in Lt shoulder to no greater than a 5 Baseline: 05/15/23:  Reports compliance with HEP 2-3x/week, average pain scale 5/10. Goal status: MET  2.  ROM in Lt shoulder to be increased 20 degrees to be  able to lift items to shelves that are above shoulder height Baseline: 05/15/23:  see ROM, continues to present with weakness with increased difficulty lifting items to shoulder height Goal status: on going   3.   Strength in Lt shoulder to be increased 1/2 grade to be able to be able to lift items to shelves that are above shoulder height. Baseline: 05/15/23: see MMT Goal status: MET   LONG TERM GOALS: Target date: 06/14/23  PT to be I in HEP in order to decrease pain in Lt shoulder to no greater than a 1  Baseline:  Goal status: on going   2.  Strength in Lt shoulder to be increased 1 grade to be able to lift items to shelves that are over head  height. Baseline:  see above Goal status: on going   3.  Pt to be able  to complete all housework activity Baseline: 05/15/23:  Reports ability to complete all housework activities, still has difficulty reaching shoulder height and above Goal status: on going   5.  Pt to be able to complete all work activity Baseline: 05/15/23:  Continues to have difficulty reaching  Goal status: on going  PLAN:  PT FREQUENCY: 2x/week  PT DURATION: 6 weeks  PLANNED INTERVENTIONS: Therapeutic exercises, Patient/Family education, Self Care, Joint mobilization, and Manual therapy  PLAN FOR NEXT SESSION: TPDN/Manual work and stretching as indicated for UT, pecs. Continue scapular strengthening and rotator cuff strengthening.    Sosie Gato April Ma L Antonisha Waskey, PT 06/12/2023, 1:34 PM

## 2023-06-15 ENCOUNTER — Encounter (HOSPITAL_COMMUNITY): Payer: 59 | Admitting: Physical Therapy

## 2023-06-19 ENCOUNTER — Ambulatory Visit (INDEPENDENT_AMBULATORY_CARE_PROVIDER_SITE_OTHER): Payer: 59 | Admitting: Internal Medicine

## 2023-06-19 ENCOUNTER — Encounter: Payer: Self-pay | Admitting: Internal Medicine

## 2023-06-19 ENCOUNTER — Encounter (HOSPITAL_COMMUNITY): Payer: 59 | Admitting: Physical Therapy

## 2023-06-19 VITALS — BP 122/70 | HR 100 | Ht 63.0 in | Wt 327.0 lb

## 2023-06-19 DIAGNOSIS — C73 Malignant neoplasm of thyroid gland: Secondary | ICD-10-CM

## 2023-06-19 DIAGNOSIS — E890 Postprocedural hypothyroidism: Secondary | ICD-10-CM

## 2023-06-19 NOTE — Patient Instructions (Signed)

## 2023-06-19 NOTE — Progress Notes (Unsigned)
Name: Christine Mason  MRN/ DOB: [id number], 1971-08-14    Age/ Sex: 52 y.o., female     PCP: Benita Stabile, MD   Reason for Endocrinology Evaluation: Postoperative hypothyroidism/PTC     Initial Endocrinology Clinic Visit: 05/03/2020    PATIENT IDENTIFIER: Christine Mason is a 52 y.o., female with a past medical history of HTN, Hx of PTC. She has followed with [facility] since 05/03/2020 for consultative assistance with management of her postoperative hypothyroidism/PTC.   HISTORICAL SUMMARY: The patient was initially diagnosed with hyperthyroidism and MNG in 02/2020    She is S/P LLL nodule 02/2020 with cytology report consistent  with bethesda  category III , Afirma came back suspicious.   She is S/P total thyroidectomy 07/01/2020 with 1 cm PTC with  1 cm left thyroid tumor, unifocal , margins uninvolved. 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  Status post benign FNA on 01/18/2022.   Thyroid bed ultrasound 08/03/2022 showed interval enlargement of the left thyroid nodule at 1.7 cm.  She is S/P FNA 08/03/2022, despite no malignant cells the evaluation was considered limited due to abundance of histiocytes and inflammatory cells.   WBS  09/2022 no evidence of local neck recurrence or metastases  CT neck 10/2022 revealed 1.2 cm x 1.5 cm partially calcified left level IV node suspicious for nodal metastatic disease  She was evaluated by ENT 11/2022 (Dr. Pollyann Kennedy) , she is s/p  left leve II, III and IV  neck dissection 02/28/2023, 1 out of 26 lymph nodes were positive    Uncle with medullary cancer     SUBJECTIVE:    Today (06/19/2023):  Christine Mason is here for a  follow up on hypothyroidism and Hx of PTC.  She is accompanied by her aunt today.  She is s/p neck dissection with Dr. Pollyann Kennedy 02/28/2023, pathology report positive of 1 out of 26 lymph nodes   Weight continues to fluctuate Continues with fatigue, has OSA and does not wear CPAP  Has  swelling and tightness on the left posterior neck  Has occasional  palpitations  Has occasional  tremors  Denies  diarrhea or loose stools     Levothyroxine 200 mcg daily  Levothyroxine 50 mcg daily      HISTORY:  Past Medical History:  Past Medical History:  Diagnosis Date   Anxiety    Arthritis    Back pain    Carpal tunnel syndrome    bi lat hands   Chronic leg pain    right   Chronic pain of left knee    Depression    Diverticulitis    Fatty liver    GERD (gastroesophageal reflux disease)    Heart murmur    High cholesterol    History of kidney stones    Hypertension    Hypothyroidism    Joint pain    Pre-diabetes    Sleep apnea     no C- Pap   Swallowing difficulty    Thyroid cancer [facility])    Past Surgical History:  Past Surgical History:  Procedure Laterality Date   ACNE CYST REMOVAL     BREAST BIOPSY  Right 12/22/2022   AGGREGATED APOCRINE CYSTS- NEGATIVE FOR MALIGNANCY of the RIGHT breast   CHOLECYSTECTOMY     COLONOSCOPY     DILATION AND CURETTAGE OF UTERUS  1998   ESOPHAGOGASTRODUODENOSCOPY (EGD) WITH PROPOFOL N/A 06/13/2019   Procedure: ESOPHAGOGASTRODUODENOSCOPY (EGD) WITH PROPOFOL;  Surgeon: Malissa Hippo, MD;  Location: [facility];  Service: Endoscopy;  Laterality: N/A;  7:30   KNEE SURGERY Left    Meniscus Tear   MALONEY DILATION  06/13/2019   Procedure: MALONEY DILATION;  Surgeon: Malissa Hippo, MD;  Location: [facility];  Service: Endoscopy;;   MASS EXCISION     RADICAL NECK DISSECTION Left 02/28/2023   Procedure: MODIFIED NECK DISSECTION;  Surgeon: Serena Colonel, MD;  Location: [facility] OR;  Service: ENT;  Laterality: Left;   THORACOTOMY Right 2016    THYROIDECTOMY N/A 07/01/2020   Procedure: TOTAL THYROIDECTOMY WITH Central compartment lymph node disection;  Surgeon: Darnell Level, MD;  Location: [facility] ORS;  Service: General;  Laterality: N/A;   Social History:  reports that she quit smoking about 5 years ago. Her smoking use included cigarettes. She started smoking about 10 years ago. She has a 2.5 pack-year smoking history. She has never used smokeless tobacco. She reports that she does not drink alcohol and does not use drugs. Family History:  Family History  Problem Relation Age of Onset   Sleep apnea Mother    Cancer Mother    Hyperlipidemia Mother    Diabetes Mother    Hypertension Mother    Breast cancer Mother 66       dx again at 39; PALB2 and CHEK2 positive   Depression Mother    Obesity Mother    Stroke Father    Depression Father    Obesity Father    Hyperlipidemia Father    Diabetes Father    Hypertension Father    Coronary artery disease Father    Sleep apnea Father    Heart disease Father    Hypertension Sister    Breast cancer Sister        diagnosed in her 75's   Lung cancer Maternal Aunt 67   Thyroid cancer Maternal Uncle 46       medullary   Lymphoma Maternal Uncle 60       follicular lymphoma   Heart attack Maternal Uncle    Lung cancer Maternal Grandmother    Leukemia Maternal Grandfather 10       dx with hairy cell leukemia at 33; CLL at 34   Lymphoma Maternal Grandfather 70       hodgkins lymphoma   Brain cancer Paternal Grandmother    Prostate cancer Paternal Grandfather    Allergic rhinitis Neg Hx    Asthma Neg Hx      HOME MEDICATIONS: Allergies as of 06/19/2023       Reactions   Penicillins Rash   Has patient had a PCN reaction causing immediate rash, facial/tongue/throat swelling, SOB or lightheadedness with hypotension: {Yes Has patient had a PCN reaction causing severe rash involving mucus membranes or skin necrosis:unknown Has patient had a PCN reaction that required  hospitalizationNo Has patient had a PCN reaction occurring within the last 10 years:No If all of the above answers are "NO", then may proceed with Cephalosporin use.        Medication List        Accurate as of June 19, 2023  2:20 PM. If you have any questions, ask your nurse or doctor.  STOP taking these medications    bacitracin ointment Stopped by: Johnney Ou Ginger Leeth       TAKE these medications    atenolol 50 MG tablet Commonly known as: Tenormin Take 1 tablet (50 mg total) by mouth daily. What changed: when to take this   desvenlafaxine 100 MG 24 hr tablet Commonly known as: PRISTIQ Take 1 tablet (100 mg total) by mouth daily.   desvenlafaxine 100 MG 24 hr tablet Commonly known as: PRISTIQ Take 1 tablet by mouth daily.   EPINEPHrine 0.3 mg/0.3 mL Soaj injection Commonly known as: EPI-PEN Inject 0.3 mg into the muscle as needed for anaphylaxis.   levocetirizine 5 MG tablet Commonly known as: XYZAL Take 1 tablet (5 mg total) by mouth daily.   levothyroxine 200 MCG tablet Commonly known as: Synthroid Take 1 tablet (200 mcg total) by mouth daily.   Synthroid 50 MCG tablet Generic drug: levothyroxine TAKE 1 CAPSULE BY MOUTH ONCE A DAY. TO BE TAKEN WITH LEVOTHYROXINE 200 MCG ONCE A DAY.   LORazepam 1 MG tablet Commonly known as: ATIVAN Take 1 mg by mouth at bedtime.   losartan 100 MG tablet Commonly known as: COZAAR Take 100 mg by mouth daily.   multivitamin tablet Take 2 tablets by mouth daily. gummy   Neurontin 300 MG capsule Generic drug: gabapentin TAKE 1 CAPSULE BY MOUTH ONCE A DAY AT BEDTIME   NON FORMULARY Pt uses a cpap nightly   ondansetron 4 MG disintegrating tablet Commonly known as: ZOFRAN-ODT Take 1 tablet (4 mg total) by mouth every 8 (eight) hours as needed for nausea or vomiting.   oxybutynin 10 MG 24 hr tablet Commonly known as: DITROPAN-XL Take 10 mg by mouth 2 (two) times daily.   pantoprazole 40 MG  tablet Commonly known as: PROTONIX Take 40 mg by mouth 2 (two) times daily.   rosuvastatin 10 MG tablet Commonly known as: CRESTOR Take 10 mg by mouth daily.   spironolactone-hydrochlorothiazide 25-25 MG tablet Commonly known as: Aldactazide Take 1 tablet by mouth every morning.   tiZANidine 4 MG tablet Commonly known as: ZANAFLEX Take 1 tablet (4 mg total) by mouth every 8 (eight) hours as needed for 10 days.   Vitamin D3 50 MCG (2000 UT) Tabs Take 2,000 Units by mouth daily.          OBJECTIVE:   PHYSICAL EXAM: VS: BP 122/70 (BP Location: Left Arm, Patient Position: Sitting, Cuff Size: Large)   Pulse 100   Ht 5\' 3"  (1.6 m)   Wt (!) 327 lb (148.3 kg)   LMP 05/22/2015   SpO2 94%   BMI 57.93 kg/m    EXAM: General: Pt appears well and is in NAD  Neck: General: Supple without adenopathy. Thyroid:   No goiter or nodules appreciated., Left neck incision with staples  Lungs: Clear with good BS bilat with no rales, rhonchi, or wheezes  Heart: Auscultation: RRR.  Mental Status: Judgment, insight: Intact Orientation: Oriented to time, place, and person Mood and affect: No depression, anxiety, or agitation     DATA REVIEWED:    Latest Reference Range & Units 03/15/23 10:56  TSH 0.35 - 5.50 uIU/mL 0.03 (L)  (L): Data is abnormally low   Latest Reference Range & Units 02/20/23 10:07  Sodium 135 - 145 mmol/L 139  Potassium 3.5 - 5.1 mmol/L 3.6  Chloride 98 - 111 mmol/L 103  CO2 22 - 32 mmol/L 28  Glucose 70 - 99 mg/dL 956 (H)  BUN 6 - 20  mg/dL 12  Creatinine 5.28 - 4.13 mg/dL 2.44  Calcium 8.9 - 01.0 mg/dL 9.3  Anion gap 5 - 15  8  Alkaline Phosphatase 38 - 126 U/L 62  Albumin 3.5 - 5.0 g/dL 4.0  AST 15 - 41 U/L 27  ALT 0 - 44 U/L 32  Total Protein 6.5 - 8.1 g/dL 7.1  Total Bilirubin 0.3 - 1.2 mg/dL 0.7  GFR, Estimated >27 mL/min >60  (H): Data is abnormally high   Thyroid Pathology 07/01/2020   Clinical History: Thyroid neoplasm of uncertain  behavior, multiple  thyroid nodules (crm)      FINAL MICROSCOPIC DIAGNOSIS:   A. THYROID, TOTAL THYROIDECTOMY:  - Papillary thyroid carcinoma, classical variant, 1 cm, involving  inferior pole of the right lobe  - Papillary thyroid carcinoma, classical variant, involving left lobe  - Carcinoma is confined to thyroid gland without extrathyroidal  extension  - Resection margins are negative for carcinoma  - Negative for lymphovascular invasion  - Benign hyperplastic nodule, 2.5 cm, of the right lobe  - See oncology table   B. 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 Current dimensions 1.4 cm x 1.6 cm x 1.7 cm, previously 1.3 cm x 1.2 cm 1.5 cm.   Additional small focal hypodensities medial to the carotid sheath in the surgical thyroid bed, 6 mm and unchanged from the prior.   IMPRESSION: Total thyroidectomy, with redemonstration of soft tissue nodule lateral to the left carotid sheath. This remains suspicious for residual/recurrence, particularly given slight interval enlargement to a maximum dimension of 1.7 cm. Either a repeat biopsy or further evaluation with nuclear medicine testing may be useful.     FNA left nodule 01/18/2022   FINAL MICROSCOPIC DIAGNOSIS:  - Consistent with benign follicular nodule (Bethesda category II)   FNA left nodule 08/03/2022  Clinical History: 1.7 cm LLL, left thyroid bed nodule; Hx thyroidectomy 2021, papillary thyroid cancer Specimen Submitted:  A. THYROID, LLL, FINE NEEDLE ASPIRATION:   FINAL MICROSCOPIC DIAGNOSIS: - No malignant cells identified - See comment  Comment: While no malignant cells are identified, evaluation is  limited by a paucicellular specimen consisting predominantly of histiocytes and chronic inflammatory cells.    WBS 09/27/2022  FINDINGS: No focal activity in thyroid bed. No abnormal uptake on whole-body I 131 scan. Physiologic uptake noted in the GI and GU tract.   IMPRESSION: No evidence of local thyroid cancer recurrence or distant metastatic disease.  CT neck 11/06/2022  Status post thyroidectomy without abnormal soft tissue density in the thyroidectomy bed. 2. 1.2 cm x 1.5 cm partially calcified left level IV node suspicious for nodal metastatic disease. A 0.8 cm short axis left level II node is indeterminate. No other pathologic lymphadenopathy in the imaged neck.   Pathology report 02/28/2023   FINAL MICROSCOPIC DIAGNOSIS:  A. 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      Metastatic focus: 12 mm in maximal dimension.      No extranodal extension identified.   Old records , labs and images have been reviewed.    ASSESSMENT / PLAN / RECOMMENDATIONS:  PTC with recurrence:   -S/p total thyroidectomy with 1 cm papillary thyroid carcinoma on the left, 2/2 lymph node positive for malignancy (06/2020)  - S/P RAI 98.4 mCi I-131 sodium iodide on 08/20/2020 post treatment WBS was showed tracer activity at the left thyroid bed  -Thyroid bed ultrasound revealed 1.5 cm left neck nodule (12/2021), s/p benign FNA May/2023.  Due to interval enlargement we attempted another FNA 08/03/2022 but despite no evidence of malignant cells the sample was considered of limited evaluation - WBS 09/2022 did not show any evidence of local thyroid recurrence -CT neck 10/2022 revealed 1.5 cm partially calcified left suspicious nodule. -S/p left neck dissection with 1 out of 26 lymph nodes was positive for metastatic PTC -Patient is at moderate risk for recurrence -TSH goal  0.1-0.5 uIU/mL  -TG has trended down following her surgery   2. Postoperative hypothyroidism   -  Medications  Continue levothyroxine 200 mcg daily PLUS  Continue  levothyroxine 50 mcg daily     Follow-up in 6 months     Signed electronically by: Lyndle Herrlich, MD  [facility]  [facility] [address] [place] [place], [place] [address] Phone: [phone] FAX: [phone]      CC: Benita Stabile, MD [address] [place] [address] Phone: [phone]  Fax: [phone]   Return to Endocrinology clinic as below: Future Appointments  Date Time Provider Department Center  05/05/2024 11:45 AM Serena Croissant, MD [facility] None

## 2023-06-20 LAB — TSH: TSH: 0.03 u[IU]/mL — ABNORMAL LOW (ref 0.35–5.50)

## 2023-06-20 MED ORDER — LEVOTHYROXINE SODIUM 200 MCG PO TABS: 200.0000 ug | ORAL_TABLET | Freq: Every day | ORAL | 3 refills | Status: DC

## 2023-06-20 MED ORDER — LEVOTHYROXINE SODIUM 50 MCG PO TABS: 25.0000 ug | ORAL_TABLET | Freq: Every day | ORAL | 3 refills | Status: DC

## 2023-06-21 ENCOUNTER — Other Ambulatory Visit: Payer: Self-pay

## 2023-06-21 DIAGNOSIS — R635 Abnormal weight gain: Secondary | ICD-10-CM

## 2023-06-21 LAB — THYROGLOBULIN LEVEL: Thyroglobulin: <0.1 ng/mL — ABNORMAL LOW

## 2023-06-21 LAB — THYROGLOBULIN ANTIBODY: Thyroglobulin Ab: <1 [IU]/mL (ref <=1)

## 2023-06-25 ENCOUNTER — Encounter: Payer: Self-pay | Admitting: Internal Medicine

## 2023-06-26 ENCOUNTER — Encounter (HOSPITAL_COMMUNITY): Payer: 59 | Admitting: Physical Therapy

## 2023-06-28 LAB — CORTISOL, URINE, 24 HOUR
24 Hour urine volume (VMAHVA): 1200 mL
CREATININE, URINE: 1.1 g/(24.h) (ref 0.50–2.15)
Cortisol (Ur), Free: 8.4 ug/(24.h) (ref 4.0–50.0)

## 2023-07-02 ENCOUNTER — Other Ambulatory Visit: Payer: Self-pay | Admitting: Orthopedic Surgery

## 2023-07-02 DIAGNOSIS — G5603 Carpal tunnel syndrome, bilateral upper limbs: Secondary | ICD-10-CM

## 2023-07-03 ENCOUNTER — Encounter: Payer: Self-pay | Admitting: Internal Medicine

## 2023-07-03 ENCOUNTER — Ambulatory Visit (HOSPITAL_COMMUNITY)
Admission: RE | Admit: 2023-07-03 | Discharge: 2023-07-03 | Disposition: A | Discharge status: Home / Self Care | Payer: Self-pay | Source: Ambulatory Visit | Attending: Internal Medicine | Admitting: Internal Medicine

## 2023-07-03 DIAGNOSIS — C73 Malignant neoplasm of thyroid gland: Secondary | ICD-10-CM | POA: Insufficient documentation

## 2023-07-04 ENCOUNTER — Ambulatory Visit
Admission: EM | Admit: 2023-07-04 | Discharge: 2023-07-04 | Disposition: A | Discharge status: Home / Self Care | Payer: Self-pay

## 2023-07-04 ENCOUNTER — Inpatient Hospital Stay
Admission: RE | Admit: 2023-07-04 | Discharge: 2023-07-04 | Disposition: A | Discharge status: Home / Self Care | Payer: Self-pay | Source: Ambulatory Visit

## 2023-07-04 DIAGNOSIS — L239 Allergic contact dermatitis, unspecified cause: Secondary | ICD-10-CM

## 2023-07-04 MED ORDER — TRIAMCINOLONE ACETONIDE 0.1 % EX OINT
1.0000 | TOPICAL_OINTMENT | Freq: Two times a day (BID) | CUTANEOUS | 0 refills | Status: DC
Start: 2023-07-04 — End: 2023-12-19

## 2023-07-04 NOTE — ED Provider Notes (Signed)
[facility]    CSN: [id number] Arrival date & time: 07/04/23  1148      History   Chief Complaint No chief complaint on file.   HPI Christine Mason is a 52 y.o. female.   Patient presents today with 1 day history of redness, warmth, and itching to left lower extremity.  She denies any recent change in any detergents, soaps, personal care products.  She reports she is swollen bumps on that area that are very itchy.  She has been using over-the-counter hydrocortisone cream without relief.  No throat or tongue swelling, shortness of breath, or difficulty swallowing.  Reports low-grade fever, but did not measure temperature at home.  No drainage from the area.    Past Medical History:  Diagnosis Date   Anxiety    Arthritis    Back pain    Carpal tunnel syndrome    bi lat hands   Chronic leg pain    right   Chronic pain of left knee    Depression    Diverticulitis    Fatty liver    GERD (gastroesophageal reflux disease)    Heart murmur    High cholesterol    History of kidney stones    Hypertension    Hypothyroidism    Joint pain    Pre-diabetes    Sleep apnea     no C- Pap   Swallowing difficulty    Thyroid cancer [facility])     Patient Active Problem List   Diagnosis Date Noted   Thyroid cancer (HCC) 02/28/2023   Multiple thyroid nodules 08/24/2022   Hepatic vein stenosis 08/24/2022   Depression 07/11/2022   Other fatigue 06/27/2022   SOBOE (shortness of breath on exertion) 06/27/2022   Osteoarthritis of both knees 06/27/2022   Pre-diabetes 06/27/2022   Other hyperlipidemia 06/27/2022   Depression screening 06/27/2022   Seasonal and perennial allergic rhinitis 03/08/2022   Wheeze 03/08/2022   Gastroesophageal reflux disease 03/08/2022   Hx of papillary thyroid carcinoma 04/14/2021   Status post total thyroidectomy 04/14/2021   Unilateral primary osteoarthritis, left knee 03/24/2021   Chronic pain of left knee 03/24/2021   Morbid obesity (HCC)  02/09/2021   Abnormal liver function tests 02/09/2021   Chest pain 02/09/2021   Chronic low back pain 02/09/2021   Generalized hyperhidrosis 02/09/2021   Major depressive disorder 02/09/2021   Palpitations 02/09/2021   Urinary incontinence 02/09/2021   Impaired fasting glucose 02/01/2021   Essential hypertension 02/01/2021   Mixed hyperlipidemia 02/01/2021   Vitamin D deficiency 02/01/2021   Hypothyroidism 01/12/2021   Hypokalemia 01/12/2021   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   Neoplasm of uncertain behavior of thyroid gland 06/29/2020   Oropharyngeal dysphagia 05/06/2019   Dysphagia, pharyngoesophageal phase 05/05/2019   Vasomotor symptoms due to menopause 03/13/2019   Genetic testing 06/03/2015   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    Past Surgical History:  Procedure Laterality Date   ACNE CYST REMOVAL     BREAST BIOPSY Right 12/22/2022   AGGREGATED APOCRINE CYSTS- NEGATIVE FOR MALIGNANCY of the RIGHT breast   CHOLECYSTECTOMY     COLONOSCOPY     DILATION AND CURETTAGE OF UTERUS  1998   ESOPHAGOGASTRODUODENOSCOPY (EGD) WITH PROPOFOL N/A 06/13/2019   Procedure: ESOPHAGOGASTRODUODENOSCOPY (EGD) WITH PROPOFOL;  Surgeon: Malissa Hippo, MD;  [facility];  Service: Endoscopy;  Laterality: N/A;  7:30   KNEE SURGERY Left    Meniscus Tear   MALONEY DILATION  06/13/2019  Procedure: MALONEY DILATION;  Surgeon: Malissa Hippo, MD;  Location: [facility];  Service: Endoscopy;;   MASS EXCISION     RADICAL NECK DISSECTION Left 02/28/2023   Procedure: MODIFIED NECK DISSECTION;  Surgeon: Serena Colonel, MD;  Location: [facility] OR;  Service: ENT;  Laterality: Left;   THORACOTOMY Right 2016   THYROIDECTOMY N/A 07/01/2020   Procedure: TOTAL THYROIDECTOMY WITH Central compartment lymph node disection;  Surgeon: Darnell Level, MD;  Location: [facility];  Service: General;  Laterality: N/A;    OB History     Gravida  1   Para      Term       Preterm      AB  1   Living  0      SAB  1   IAB      Ectopic      Multiple      Live Births               Home Medications    Prior to Admission medications   Medication Sig Start Date End Date Taking? Authorizing Provider  triamcinolone ointment (KENALOG) 0.1 % Apply 1 Application topically 2 (two) times daily. Apply sparingly up to twice daily as needed for itch 07/04/23  Yes Cathlean Marseilles A, NP  atenolol (TENORMIN) 50 MG tablet Take 1 tablet (50 mg total) by mouth daily. Patient taking differently: Take 50 mg by mouth at bedtime. 04/19/18   Serena Croissant, MD  Cholecalciferol (VITAMIN D3) 50 MCG (2000 UT) TABS Take 2,000 Units by mouth daily.    [provider]  desvenlafaxine (PRISTIQ) 100 MG 24 hr tablet Take 1 tablet (100 mg total) by mouth daily. 05/02/22   Serena Croissant, MD  desvenlafaxine (PRISTIQ) 100 MG 24 hr tablet Take 1 tablet by mouth daily. 05/30/23   [provider]  EPINEPHrine 0.3 mg/0.3 mL IJ SOAJ injection Inject 0.3 mg into the muscle as needed for anaphylaxis. 03/08/22   Hetty Blend, FNP  HYDROcodone-acetaminophen (NORCO/VICODIN) 5-325 MG tablet Take 1 tablet by mouth every 6 (six) hours as needed.    [provider]  levocetirizine (XYZAL) 5 MG tablet Take 1 tablet (5 mg total) by mouth daily. 03/03/23   Scarlette Ar, MD  levothyroxine (SYNTHROID) 200 MCG tablet Take 1 tablet (200 mcg total) by mouth daily. 06/20/23   Shamleffer, Konrad Dolores, MD  levothyroxine (SYNTHROID) 50 MCG tablet Take 0.5 tablets (25 mcg total) by mouth daily before breakfast. 06/20/23   Shamleffer, Konrad Dolores, MD  LORazepam (ATIVAN) 1 MG tablet Take 1 mg by mouth at bedtime.  04/02/20   [provider]  losartan (COZAAR) 100 MG tablet Take 100 mg by mouth daily.  12/09/18   [provider]  Multiple Vitamin (MULTIVITAMIN) tablet Take 2 tablets by mouth daily. gummy    [provider]  NEURONTIN 300 MG capsule TAKE  1 CAPSULE BY MOUTH ONCE A DAY AT BEDTIME 07/02/23   Vickki Hearing, MD  NON FORMULARY Pt uses a cpap nightly    [provider]  ondansetron (ZOFRAN-ODT) 4 MG disintegrating tablet Take 1 tablet (4 mg total) by mouth every 8 (eight) hours as needed for nausea or vomiting. 02/16/23   Rodriguez-Southworth, Nettie Elm, PA-C  oxybutynin (DITROPAN-XL) 10 MG 24 hr tablet Take 10 mg by mouth 2 (two) times daily.    [provider]  pantoprazole (PROTONIX) 40 MG tablet Take 40 mg by mouth 2 (two) times daily.    [provider]  rosuvastatin (CRESTOR) 10 MG tablet Take 10 mg by mouth daily. 11/09/20   [provider]  spironolactone-hydrochlorothiazide (ALDACTAZIDE) 25-25 MG tablet Take 1 tablet by mouth every morning. 06/11/23   Yates Decamp, MD  tiZANidine (ZANAFLEX) 4 MG tablet Take 1 tablet (4 mg total) by mouth every 8 (eight) hours as needed for 10 days. 02/17/22     traZODone (DESYREL) 50 MG tablet Take 1 tablet (50 mg total) by mouth at bedtime. 09/26/22 09/27/22      Family History Family History  Problem Relation Age of Onset   Sleep apnea Mother    Cancer Mother    Hyperlipidemia Mother    Diabetes Mother    Hypertension Mother    Breast cancer Mother 56       dx again at 50; PALB2 and CHEK2 positive   Depression Mother    Obesity Mother    Stroke Father    Depression Father    Obesity Father    Hyperlipidemia Father    Diabetes Father    Hypertension Father    Coronary artery disease Father    Sleep apnea Father    Heart disease Father    Hypertension Sister    Breast cancer Sister        diagnosed in her 61's   Lung cancer Maternal Aunt 29   Thyroid cancer Maternal Uncle 46       medullary   Lymphoma Maternal Uncle 60       follicular lymphoma   Heart attack Maternal Uncle    Lung cancer Maternal Grandmother    Leukemia Maternal Grandfather 74       dx with hairy cell leukemia at 60; CLL at 87   Lymphoma Maternal Grandfather 6        hodgkins lymphoma   Brain cancer Paternal Grandmother    Prostate cancer Paternal Grandfather    Allergic rhinitis Neg Hx    Asthma Neg Hx     Social History Social History   Tobacco Use   Smoking status: Former    Current packs/day: 0.00    Average packs/day: 0.5 packs/day for 5.0 years (2.5 ttl pk-yrs)    Types: Cigarettes    Start date: 09/15/2012    Quit date: 09/15/2017    Years since quitting: 5.8   Smokeless tobacco: Never  Vaping Use   Vaping status: Never Used  Substance Use Topics   Alcohol use: No   Drug use: No     Allergies   Penicillins   Review of Systems Review of Systems Per HPI  Physical Exam Triage Vital Signs ED Triage Vitals [07/04/23 1241]  Encounter Vitals Group     BP (!) 151/88     Systolic BP Percentile      Diastolic BP Percentile      Pulse Rate 75     Resp 17     Temp 98.7 F (37.1 C)     Temp Source Oral     SpO2 97 %     Weight      Height      Head Circumference      Peak Flow      Pain Score 5     Pain Loc      Pain Education      Exclude from Growth Chart    No data found.  Updated Vital Signs BP (!) 151/88 (BP Location: Right Arm)   Pulse 75   Temp 98.7 F (37.1 C) (  Oral)   Resp 17   LMP 05/22/2015   SpO2 97%   Visual Acuity Right Eye Distance:   Left Eye Distance:   Bilateral Distance:    Right Eye Near:   Left Eye Near:    Bilateral Near:     Physical Exam Vitals and nursing note reviewed.  Constitutional:      General: She is not in acute distress.    Appearance: Normal appearance. She is not toxic-appearing.  HENT:     Mouth/Throat:     Mouth: Mucous membranes are moist.     Pharynx: Oropharynx is clear.  Pulmonary:     Effort: Pulmonary effort is normal. No respiratory distress.  Skin:    General: Skin is warm and dry.     Capillary Refill: Capillary refill takes less than 2 seconds.     Findings: Erythema and rash present. Rash is urticarial.          Comments: There is a raised  plaque that is slightly erythematous to left lower extremity in approximately area marked.  No surrounding fluctuance, warmth, active drainage.  Neurological:     Mental Status: She is alert and oriented to person, place, and time.  Psychiatric:        Behavior: Behavior is cooperative.      UC Treatments / Results  Labs (all labs ordered are listed, but only abnormal results are displayed) Labs Reviewed - No data to display  EKG   Radiology No results found.  Procedures Procedures (including critical care time)  Medications Ordered in UC Medications - No data to display  Initial Impression / Assessment and Plan / UC Course  I have reviewed the triage vital signs and the nursing notes.  Pertinent labs & imaging results that were available during my care of the patient were reviewed by me and considered in my medical decision making (see chart for details).   Patient is well-appearing, afebrile, not tachycardic, not tachypneic, oxygenating well on room air.  Patient is mildly hypertensive in urgent care today.  1. Allergic contact dermatitis, unspecified trigger No red flags, vitals are reassuring Treat with topical steroid ointment, start oral antihistamine regimen Strict ER and return precautions discussed with patient  The patient was given the opportunity to ask questions.  All questions answered to their satisfaction.  The patient is in agreement to this plan.    Final Clinical Impressions(s) / UC Diagnoses   Final diagnoses:  Allergic contact dermatitis, unspecified trigger     Discharge Instructions      Start using the topical ointment I have sent to the pharmacy to the itchy area to help with itching.  Please also start taking Zyrtec, Allegra, or Claritin twice daily to help with itching.  Seek care if you develop worsening symptoms or drainage from the area.    ED Prescriptions     Medication Sig Dispense Auth. Provider   triamcinolone ointment  (KENALOG) 0.1 % Apply 1 Application topically 2 (two) times daily. Apply sparingly up to twice daily as needed for itch 30 g Valentino Nose, NP      PDMP not reviewed this encounter.   Valentino Nose, NP 07/04/23 1300

## 2023-07-04 NOTE — ED Triage Notes (Signed)
Pt c/o possible of insect bite to the back of the left leg redness and heat to the sight, pain at the sight x 3 days. Had low grade fever last night denies drainage from the sight of the bite.

## 2023-07-04 NOTE — Discharge Instructions (Signed)
Start using the topical ointment I have sent to the pharmacy to the itchy area to help with itching.  Please also start taking Zyrtec, Allegra, or Claritin twice daily to help with itching.  Seek care if you develop worsening symptoms or drainage from the area.

## 2023-07-06 ENCOUNTER — Ambulatory Visit
Admission: RE | Admit: 2023-07-06 | Discharge: 2023-07-06 | Disposition: A | Discharge status: Home / Self Care | Payer: Self-pay | Source: Ambulatory Visit | Attending: Family Medicine | Admitting: Family Medicine

## 2023-07-06 VITALS — BP 169/100 | HR 88 | Temp 97.7°F | Resp 18

## 2023-07-06 DIAGNOSIS — L239 Allergic contact dermatitis, unspecified cause: Secondary | ICD-10-CM

## 2023-07-06 MED ORDER — METHYLPREDNISOLONE SODIUM SUCC 125 MG IJ SOLR
80.0000 mg | Freq: Once | INTRAMUSCULAR | Status: AC
  Administered 2023-07-06: 80 mg via INTRAMUSCULAR

## 2023-07-06 NOTE — ED Provider Notes (Signed)
[facility]    CSN: [id number] Arrival date & time: 07/06/23  1450      History   Chief Complaint Chief Complaint  Patient presents with   Leg Pain    Entered by patient    HPI Christine Mason is a 52 y.o. female.   Presenting today following up on contact dermatitis of the left calf for which she was seen on 1016.  She was given triamcinolone cream and has been taking antihistamines by mouth with no relief.  Significant itching, swelling to the area.  Denies pain, fevers, sores or drainage.  Unsure what caused the initial reaction.    Past Medical History:  Diagnosis Date   Anxiety    Arthritis    Back pain    Carpal tunnel syndrome    bi lat hands   Chronic leg pain    right   Chronic pain of left knee    Depression    Diverticulitis    Fatty liver    GERD (gastroesophageal reflux disease)    Heart murmur    High cholesterol    History of kidney stones    Hypertension    Hypothyroidism    Joint pain    Pre-diabetes    Sleep apnea     no C- Pap   Swallowing difficulty    Thyroid cancer [facility])     Patient Active Problem List   Diagnosis Date Noted   Thyroid cancer (HCC) 02/28/2023   Multiple thyroid nodules 08/24/2022   Hepatic vein stenosis 08/24/2022   Depression 07/11/2022   Other fatigue 06/27/2022   SOBOE (shortness of breath on exertion) 06/27/2022   Osteoarthritis of both knees 06/27/2022   Pre-diabetes 06/27/2022   Other hyperlipidemia 06/27/2022   Depression screening 06/27/2022   Seasonal and perennial allergic rhinitis 03/08/2022   Wheeze 03/08/2022   Gastroesophageal reflux disease 03/08/2022   Hx of papillary thyroid carcinoma 04/14/2021   Status post total thyroidectomy 04/14/2021   Unilateral primary osteoarthritis, left knee 03/24/2021   Chronic pain of left knee 03/24/2021   Morbid obesity (HCC) 02/09/2021   Abnormal liver function tests 02/09/2021   Chest pain 02/09/2021   Chronic low back pain 02/09/2021    Generalized hyperhidrosis 02/09/2021   Major depressive disorder 02/09/2021   Palpitations 02/09/2021   Urinary incontinence 02/09/2021   Impaired fasting glucose 02/01/2021   Essential hypertension 02/01/2021   Mixed hyperlipidemia 02/01/2021   Vitamin D deficiency 02/01/2021   Hypothyroidism 01/12/2021   Hypokalemia 01/12/2021   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   Neoplasm of uncertain behavior of thyroid gland 06/29/2020   Oropharyngeal dysphagia 05/06/2019   Dysphagia, pharyngoesophageal phase 05/05/2019   Vasomotor symptoms due to menopause 03/13/2019   Genetic testing 06/03/2015   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    Past Surgical History:  Procedure Laterality Date   ACNE CYST REMOVAL     BREAST BIOPSY Right 12/22/2022   AGGREGATED APOCRINE CYSTS- NEGATIVE FOR MALIGNANCY of the RIGHT breast   CHOLECYSTECTOMY     COLONOSCOPY     DILATION AND CURETTAGE OF UTERUS  1998   ESOPHAGOGASTRODUODENOSCOPY (EGD) WITH PROPOFOL N/A 06/13/2019   Procedure: ESOPHAGOGASTRODUODENOSCOPY (EGD) WITH PROPOFOL;  Surgeon: Malissa Hippo, MD;  [facility];  Service: Endoscopy;  Laterality: N/A;  7:30   KNEE SURGERY Left    Meniscus Tear   MALONEY DILATION  06/13/2019   Procedure: MALONEY DILATION;  Surgeon: Malissa Hippo, MD;  [facility];  Service: Endoscopy;;   MASS EXCISION     RADICAL NECK DISSECTION Left 02/28/2023   Procedure: MODIFIED NECK DISSECTION;  Surgeon: Serena Colonel, MD;  Location: [facility] OR;  Service: ENT;  Laterality: Left;   THORACOTOMY Right 2016   THYROIDECTOMY N/A 07/01/2020   Procedure: TOTAL THYROIDECTOMY WITH Central compartment lymph node disection;  Surgeon: Darnell Level, MD;  Location: [facility];  Service: General;  Laterality: N/A;    OB History     Gravida  1   Para      Term      Preterm      AB  1   Living  0      SAB  1   IAB      Ectopic      Multiple      Live Births                Home Medications    Prior to Admission medications   Medication Sig Start Date End Date Taking? Authorizing Provider  atenolol (TENORMIN) 50 MG tablet Take 1 tablet (50 mg total) by mouth daily. Patient taking differently: Take 50 mg by mouth at bedtime. 04/19/18   Serena Croissant, MD  Cholecalciferol (VITAMIN D3) 50 MCG (2000 UT) TABS Take 2,000 Units by mouth daily.    [provider]  desvenlafaxine (PRISTIQ) 100 MG 24 hr tablet Take 1 tablet (100 mg total) by mouth daily. 05/02/22   Serena Croissant, MD  desvenlafaxine (PRISTIQ) 100 MG 24 hr tablet Take 1 tablet by mouth daily. 05/30/23   [provider]  EPINEPHrine 0.3 mg/0.3 mL IJ SOAJ injection Inject 0.3 mg into the muscle as needed for anaphylaxis. 03/08/22   Hetty Blend, FNP  HYDROcodone-acetaminophen (NORCO/VICODIN) 5-325 MG tablet Take 1 tablet by mouth every 6 (six) hours as needed.    [provider]  levocetirizine (XYZAL) 5 MG tablet Take 1 tablet (5 mg total) by mouth daily. 03/03/23   Scarlette Ar, MD  levothyroxine (SYNTHROID) 200 MCG tablet Take 1 tablet (200 mcg total) by mouth daily. 06/20/23   Shamleffer, Konrad Dolores, MD  levothyroxine (SYNTHROID) 50 MCG tablet Take 0.5 tablets (25 mcg total) by mouth daily before breakfast. 06/20/23   Shamleffer, Konrad Dolores, MD  LORazepam (ATIVAN) 1 MG tablet Take 1 mg by mouth at bedtime.  04/02/20   [provider]  losartan (COZAAR) 100 MG tablet Take 100 mg by mouth daily.  12/09/18   [provider]  Multiple Vitamin (MULTIVITAMIN) tablet Take 2 tablets by mouth daily. gummy    [provider]  NEURONTIN 300 MG capsule TAKE 1 CAPSULE BY MOUTH ONCE A DAY AT BEDTIME 07/02/23   Vickki Hearing, MD  NON FORMULARY Pt uses a cpap nightly    [provider]  ondansetron (ZOFRAN-ODT) 4 MG disintegrating tablet Take 1 tablet (4 mg total) by mouth every 8 (eight) hours as needed for nausea or vomiting. 02/16/23    Rodriguez-Southworth, Nettie Elm, PA-C  oxybutynin (DITROPAN-XL) 10 MG 24 hr tablet Take 10 mg by mouth 2 (two) times daily.    [provider]  pantoprazole (PROTONIX) 40 MG tablet Take 40 mg by mouth 2 (two) times daily.    [provider]  rosuvastatin (CRESTOR) 10 MG tablet Take 10 mg by mouth daily. 11/09/20   [provider]  spironolactone-hydrochlorothiazide (ALDACTAZIDE) 25-25 MG tablet Take 1 tablet by mouth every morning. 06/11/23   Yates Decamp, MD  tiZANidine (ZANAFLEX) 4 MG tablet Take  1 tablet (4 mg total) by mouth every 8 (eight) hours as needed for 10 days. 02/17/22     triamcinolone ointment (KENALOG) 0.1 % Apply 1 Application topically 2 (two) times daily. Apply sparingly up to twice daily as needed for itch 07/04/23   Valentino Nose, NP  traZODone (DESYREL) 50 MG tablet Take 1 tablet (50 mg total) by mouth at bedtime. 09/26/22 09/27/22     Family History Family History  Problem Relation Age of Onset   Sleep apnea Mother    Cancer Mother    Hyperlipidemia Mother    Diabetes Mother    Hypertension Mother    Breast cancer Mother 28       dx again at 36; PALB2 and CHEK2 positive   Depression Mother    Obesity Mother    Stroke Father    Depression Father    Obesity Father    Hyperlipidemia Father    Diabetes Father    Hypertension Father    Coronary artery disease Father    Sleep apnea Father    Heart disease Father    Hypertension Sister    Breast cancer Sister        diagnosed in her 34's   Lung cancer Maternal Aunt 44   Thyroid cancer Maternal Uncle 46       medullary   Lymphoma Maternal Uncle 60       follicular lymphoma   Heart attack Maternal Uncle    Lung cancer Maternal Grandmother    Leukemia Maternal Grandfather 65       dx with hairy cell leukemia at 79; CLL at 49   Lymphoma Maternal Grandfather 24       hodgkins lymphoma   Brain cancer Paternal Grandmother    Prostate cancer Paternal Grandfather    Allergic rhinitis Neg Hx     Asthma Neg Hx     Social History Social History   Tobacco Use   Smoking status: Former    Current packs/day: 0.00    Average packs/day: 0.5 packs/day for 5.0 years (2.5 ttl pk-yrs)    Types: Cigarettes    Start date: 09/15/2012    Quit date: 09/15/2017    Years since quitting: 5.8   Smokeless tobacco: Never  Vaping Use   Vaping status: Never Used  Substance Use Topics   Alcohol use: No   Drug use: No     Allergies   Penicillins   Review of Systems Review of Systems Per HPI  Physical Exam Triage Vital Signs ED Triage Vitals  Encounter Vitals Group     BP 07/06/23 1515 (!) 169/100     Systolic BP Percentile --      Diastolic BP Percentile --      Pulse Rate 07/06/23 1515 88     Resp 07/06/23 1515 18     Temp 07/06/23 1515 97.7 F (36.5 C)     Temp Source 07/06/23 1515 Oral     SpO2 07/06/23 1515 91 %     Weight --      Height --      Head Circumference --      Peak Flow --      Pain Score 07/06/23 1517 0     Pain Loc --      Pain Education --      Exclude from Growth Chart --    No data found.  Updated Vital Signs BP (!) 169/100 (BP Location: Right Arm)   Pulse 88  Temp 97.7 F (36.5 C) (Oral)   Resp 18   LMP 05/22/2015   SpO2 91%   Visual Acuity Right Eye Distance:   Left Eye Distance:   Bilateral Distance:    Right Eye Near:   Left Eye Near:    Bilateral Near:     Physical Exam Vitals and nursing note reviewed.  Constitutional:      Appearance: Normal appearance. She is not ill-appearing.  HENT:     Head: Atraumatic.  Eyes:     Extraocular Movements: Extraocular movements intact.     Conjunctiva/sclera: Conjunctivae normal.  Cardiovascular:     Rate and Rhythm: Normal rate and regular rhythm.     Heart sounds: Normal heart sounds.  Pulmonary:     Effort: Pulmonary effort is normal.     Breath sounds: Normal breath sounds.  Musculoskeletal:        General: Normal range of motion.     Cervical back: Normal range of motion and  neck supple.  Skin:    General: Skin is warm and dry.     Findings: Erythema present.     Comments: Mild erythema and edema to an area about 8 cm in diameter to the left calf.  No ulcerations, drainage, fluctuance, induration  Neurological:     Mental Status: She is alert and oriented to person, place, and time.     Motor: No weakness.     Gait: Gait normal.  Psychiatric:        Mood and Affect: Mood normal.        Thought Content: Thought content normal.        Judgment: Judgment normal.      UC Treatments / Results  Labs (all labs ordered are listed, but only abnormal results are displayed) Labs Reviewed - No data to display  EKG   Radiology No results found.  Procedures Procedures (including critical care time)  Medications Ordered in UC Medications  methylPREDNISolone sodium succinate (SOLU-MEDROL) 125 mg/2 mL injection 80 mg (80 mg Intramuscular Given 07/06/23 1534)    Initial Impression / Assessment and Plan / UC Course  I have reviewed the triage vital signs and the nursing notes.  Pertinent labs & imaging results that were available during my care of the patient were reviewed by me and considered in my medical decision making (see chart for details).     Distant with a localized site reaction to the area, unclear etiology.  Treat with IM Solu-Medrol as antihistamines and triamcinolone not resolving the situation.  No evidence of a bacterial infection today.  Return for worsening symptoms.  Final Clinical Impressions(s) / UC Diagnoses   Final diagnoses:  Allergic dermatitis   Discharge Instructions   None    ED Prescriptions   None    PDMP not reviewed this encounter.   Particia Nearing, [place] 07/06/23 1538

## 2023-07-06 NOTE — ED Triage Notes (Signed)
Here on 10/16 for bite on left lower leg.  States area  is not getting better.

## 2023-07-11 ENCOUNTER — Other Ambulatory Visit (HOSPITAL_COMMUNITY): Payer: Self-pay

## 2023-07-31 ENCOUNTER — Encounter (HOSPITAL_COMMUNITY): Payer: Self-pay | Admitting: Physical Therapy

## 2023-07-31 NOTE — Therapy (Signed)
PHYSICAL THERAPY DISCHARGE SUMMARY  Visits from Start of Care: 12  Current functional level related to goals / functional outcomes: Unknown as pt has not returned    Remaining deficits: Unknown as pt has not returned    Education / Equipment: HEP   Patient agrees to discharge. Patient goals were partially met. Patient is being discharged due to not returning since the last visit.  Virgina Organ, PT CLT [phone]

## 2023-08-10 ENCOUNTER — Other Ambulatory Visit (HOSPITAL_COMMUNITY): Payer: Self-pay

## 2023-08-10 ENCOUNTER — Encounter (HOSPITAL_COMMUNITY): Payer: Self-pay

## 2023-08-13 ENCOUNTER — Other Ambulatory Visit (HOSPITAL_COMMUNITY): Payer: Self-pay

## 2023-09-04 ENCOUNTER — Other Ambulatory Visit: Payer: Self-pay

## 2023-09-06 ENCOUNTER — Other Ambulatory Visit (HOSPITAL_COMMUNITY): Payer: Self-pay

## 2023-09-27 ENCOUNTER — Ambulatory Visit: Payer: 59 | Admitting: Internal Medicine

## 2023-10-04 ENCOUNTER — Other Ambulatory Visit: Payer: Self-pay

## 2023-10-04 ENCOUNTER — Other Ambulatory Visit (HOSPITAL_COMMUNITY): Payer: Self-pay

## 2023-11-03 ENCOUNTER — Other Ambulatory Visit (HOSPITAL_COMMUNITY): Payer: Self-pay

## 2023-11-08 ENCOUNTER — Other Ambulatory Visit (HOSPITAL_COMMUNITY): Payer: Self-pay

## 2023-11-14 ENCOUNTER — Other Ambulatory Visit (HOSPITAL_COMMUNITY): Payer: Self-pay

## 2023-11-22 ENCOUNTER — Other Ambulatory Visit: Payer: Self-pay

## 2023-11-22 DIAGNOSIS — G5603 Carpal tunnel syndrome, bilateral upper limbs: Secondary | ICD-10-CM

## 2023-11-22 MED ORDER — GABAPENTIN 300 MG PO CAPS: 300.0000 mg | ORAL_CAPSULE | Freq: Every day | ORAL | 3 refills | Status: AC

## 2023-11-22 NOTE — Telephone Encounter (Signed)
 Gabapentin 300 MG    TAKE 1 CAPSULE BY MOUTH ONCE A DAY AT BEDTIME  90 day supply  [facility] [address]

## 2023-11-26 ENCOUNTER — Encounter: Payer: Self-pay | Admitting: Internal Medicine

## 2023-11-28 ENCOUNTER — Other Ambulatory Visit (HOSPITAL_COMMUNITY): Payer: Self-pay | Admitting: Family Medicine

## 2023-11-28 DIAGNOSIS — Z1231 Encounter for screening mammogram for malignant neoplasm of breast: Secondary | ICD-10-CM

## 2023-12-01 ENCOUNTER — Other Ambulatory Visit (HOSPITAL_COMMUNITY): Payer: Self-pay

## 2023-12-05 ENCOUNTER — Ambulatory Visit (HOSPITAL_COMMUNITY): Payer: Self-pay | Attending: Otolaryngology | Admitting: Occupational Therapy

## 2023-12-05 ENCOUNTER — Encounter (HOSPITAL_COMMUNITY): Payer: Self-pay | Admitting: Occupational Therapy

## 2023-12-05 DIAGNOSIS — R29898 Other symptoms and signs involving the musculoskeletal system: Secondary | ICD-10-CM | POA: Insufficient documentation

## 2023-12-05 DIAGNOSIS — M25612 Stiffness of left shoulder, not elsewhere classified: Secondary | ICD-10-CM | POA: Diagnosis present

## 2023-12-05 DIAGNOSIS — M25512 Pain in left shoulder: Secondary | ICD-10-CM | POA: Insufficient documentation

## 2023-12-05 NOTE — Therapy (Unsigned)
 OUTPATIENT OCCUPATIONAL THERAPY ORTHO EVALUATION  Patient Name: Christine Mason MRN: [id number] DOB:06-12-1971, 53 y.o., female Today's Date: 12/06/2023   END OF SESSION:  OT End of Session - 12/06/23 0805     Visit Number 1    Number of Visits 7    Date for OT Re-Evaluation 01/25/24    Authorization Type [employer]    OT Start Time 1601    OT Stop Time 1645    OT Time Calculation (min) 44 min    Activity Tolerance Patient tolerated treatment well    Behavior During Therapy WFL for tasks assessed/performed             Past Medical History:  Diagnosis Date   Anxiety    Arthritis    Back pain    Carpal tunnel syndrome    bi lat hands   Chronic leg pain    right   Chronic pain of left knee    Depression    Diverticulitis    Fatty liver    GERD (gastroesophageal reflux disease)    Heart murmur    High cholesterol    History of kidney stones    Hypertension    Hypothyroidism    Joint pain    Pre-diabetes    Sleep apnea     no C- Pap   Swallowing difficulty    Thyroid cancer (HCC)    Past Surgical History:  Procedure Laterality Date   ACNE CYST REMOVAL     BREAST BIOPSY Right 12/22/2022   AGGREGATED APOCRINE CYSTS- NEGATIVE FOR MALIGNANCY of the RIGHT breast   CHOLECYSTECTOMY     COLONOSCOPY     DILATION AND CURETTAGE OF UTERUS  1998   ESOPHAGOGASTRODUODENOSCOPY (EGD) WITH PROPOFOL N/A 06/13/2019   Procedure: ESOPHAGOGASTRODUODENOSCOPY (EGD) WITH PROPOFOL;  Surgeon: Malissa Hippo, MD;  Location: [facility];  Service: Endoscopy;  Laterality: N/A;  7:30   KNEE SURGERY Left    Meniscus Tear   MALONEY DILATION  06/13/2019   Procedure: MALONEY DILATION;  Surgeon: Malissa Hippo, MD;  Location: [facility];  Service: Endoscopy;;   MASS EXCISION     RADICAL NECK DISSECTION Left 02/28/2023   Procedure: MODIFIED NECK DISSECTION;  Surgeon: Serena Colonel, MD;  Location: [facility] OR;  Service: ENT;  Laterality: Left;   THORACOTOMY Right 2016   THYROIDECTOMY N/A  07/01/2020   Procedure: TOTAL THYROIDECTOMY WITH Central compartment lymph node disection;  Surgeon: Darnell Level, MD;  Location: [facility];  Service: General;  Laterality: N/A;   Patient Active Problem List   Diagnosis Date Noted   Thyroid cancer (HCC) 02/28/2023   Multiple thyroid nodules 08/24/2022   Hepatic vein stenosis 08/24/2022   Depression 07/11/2022   Other fatigue 06/27/2022   SOBOE (shortness of breath on exertion) 06/27/2022   Osteoarthritis of both knees 06/27/2022   Pre-diabetes 06/27/2022   Other hyperlipidemia 06/27/2022   Depression screening 06/27/2022   Seasonal and perennial allergic rhinitis 03/08/2022   Wheeze 03/08/2022   Gastroesophageal reflux disease 03/08/2022   Hx of papillary thyroid carcinoma 04/14/2021   Status post total thyroidectomy 04/14/2021   Unilateral primary osteoarthritis, left knee 03/24/2021   Chronic pain of left knee 03/24/2021   Morbid obesity (HCC) 02/09/2021   Abnormal liver function tests 02/09/2021   Chest pain 02/09/2021   Chronic low back pain 02/09/2021   Generalized hyperhidrosis 02/09/2021   Major depressive disorder 02/09/2021   Palpitations 02/09/2021   Urinary incontinence 02/09/2021   Impaired fasting glucose 02/01/2021  Essential hypertension 02/01/2021   Mixed hyperlipidemia 02/01/2021   Vitamin D deficiency 02/01/2021   Hypothyroidism 01/12/2021   Hypokalemia 01/12/2021   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   Neoplasm of uncertain behavior of thyroid gland 06/29/2020   Oropharyngeal dysphagia 05/06/2019   Dysphagia, pharyngoesophageal phase 05/05/2019   Vasomotor symptoms due to menopause 03/13/2019   Genetic testing 06/03/2015   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    PCP: Nita Sells, MD REFERRING PROVIDER: Serena Colonel, MD  ONSET DATE: 03/02/24  REFERRING DIAG: M25.9 (ICD-10-CM) - Joint disorder, unspecified   THERAPY DIAG:  Acute pain of left shoulder  Stiffness  of left shoulder, not elsewhere classified  Other symptoms and signs involving the musculoskeletal system  Rationale for Evaluation and Treatment: Rehabilitation  SUBJECTIVE:   SUBJECTIVE STATEMENT: "I can't lift, it kills me." Pt accompanied by: self  PERTINENT HISTORY: PMH significant for 2021 thyroidectomy, radical neck dissection with 26 lymphnodes removed on 02/28/23; needs a Lt TKA.  PRECAUTIONS: None  WEIGHT BEARING RESTRICTIONS: No  PAIN:  Are you having pain? Yes: NPRS scale: 5/10 Pain location: pectoralis and anterior shoulder girdle Pain description: aching Aggravating factors: Lifting/moving Relieving factors: pain medication  FALLS: Has patient fallen in last 6 months? No  PLOF: Independent  PATIENT GOALS: "to improve my arm mobility"  NEXT MD VISIT: None scheduled  OBJECTIVE:   HAND DOMINANCE: Right  ADLs: Overall ADLs: Pt has difficulty lifting her arm, limiting her with dressing, bathing, grooming. Pt having to attempt to sleep in recliner due to severe pain.   FUNCTIONAL OUTCOME MEASURES: Upper Extremity Functional Scale (UEFS): 53/80- 66.3%  UPPER EXTREMITY ROM:       Assessed in seated, er/IR adducted  Active ROM Left eval  Shoulder flexion 123  Shoulder abduction 92  Shoulder internal rotation 90  Shoulder external rotation 67  (Blank rows = not tested)    UPPER EXTREMITY MMT:     Assessed in seated, er/IR adducted  MMT Left eval  Shoulder flexion 4/5  Shoulder abduction 4+/5  Shoulder internal rotation 4+/5  Shoulder external rotation 4/5  (Blank rows = not tested)  SENSATION: Slight numbness/tingling down the arm  EDEMA: No swelling noted  OBSERVATIONS: moderate to severe fascial restrictions along biceps, trapezius, pectoralis, and scapular region.   TODAY'S TREATMENT:                                                                                                                              DATE:    12/05/23 -Evaluation -Measurements -Cervical ROM: elevation/depression, lateral tilts, rotations, chin tucks, x10 -Table Slides: flexion, abduction, x10 -AA/ROM: seated, flexion, abduction, protraction, horizontal abduction, er/IR, x10   PATIENT EDUCATION: Education details: Cervical ROM, Table slides, AA/ROM Person educated: Patient Education method: Explanation, Demonstration, and Handouts Education comprehension: verbalized understanding and returned demonstration  HOME EXERCISE PROGRAM: 3/20: Cervical ROM, Table Slides, AA/ROM  GOALS: Goals reviewed with patient? Yes   SHORT TERM GOALS:  Target date: 01/25/24  Pt will be provided with and educated on HEP to improve mobility in LUE required for use during ADL completion.   Goal status: INITIAL  LONG TERM GOALS: Target date: 01/25/24  Pt will decrease pain in LUE to 3/10 or less to improve ability to sleep for 2+ consecutive hours without waking due to pain.   Goal status: INITIAL  2.  Pt will decrease LUE fascial restrictions to min amounts or less to improve mobility required for functional reaching tasks.   Goal status: INITIAL  3.  Pt will increase LUE A/ROM by 20 degrees to improve ability to use LUE when reaching overhead or behind back during dressing and bathing tasks.   Goal status: INITIAL  4.  Pt will increase LUE strength to 4+/5 or greater to improve ability to use LUE when lifting or carrying items during meal preparation/housework/yardwork tasks.   Goal status: INITIAL  5.  Pt will return to highest level of function using LUE as non-dominant during functional task completion.   Goal status: INITIAL   ASSESSMENT:  CLINICAL IMPRESSION: Patient is a 53 y.o. female who was seen today for occupational therapy evaluation for L shoulder pain, following lymph node removal. Pt presents with increased pain and fascial restrictions, decreased ROM, strength, and functional use of the LUE.   PERFORMANCE  DEFICITS: in functional skills including in functional skills including ADLs, IADLs, coordination, tone, ROM, strength, pain, fascial restrictions, muscle spasms, and UE functional use.  IMPAIRMENTS: are limiting patient from ADLs, IADLs, rest and sleep, work, leisure, and social participation.   COMORBIDITIES: has no other co-morbidities that affects occupational performance. Patient will benefit from skilled OT to address above impairments and improve overall function.  MODIFICATION OR ASSISTANCE TO COMPLETE EVALUATION: No modification of tasks or assist necessary to complete an evaluation.  OT OCCUPATIONAL PROFILE AND HISTORY: Problem focused assessment: Including review of records relating to presenting problem.  CLINICAL DECISION MAKING: LOW - limited treatment options, no task modification necessary  REHAB POTENTIAL: Good  EVALUATION COMPLEXITY: Low      PLAN:  OT FREQUENCY: 1x/week  OT DURATION: 6 weeks  PLANNED INTERVENTIONS: 97168 OT Re-evaluation, 97535 self care/ADL training, 16109 therapeutic exercise, 97530 therapeutic activity, 97112 neuromuscular re-education, 97140 manual therapy, 97035 ultrasound, 97010 moist heat, 97032 electrical stimulation (manual), passive range of motion, functional mobility training, energy conservation, coping strategies training, patient/family education, and DME and/or AE instructions  RECOMMENDED OTHER SERVICES: Orthopedic MD  CONSULTED AND AGREED WITH PLAN OF CARE: Patient  PLAN FOR NEXT SESSION: Manual Therapy, P/ROM, AA/ROM, Isometrics  Anisa Leanos Bing Plume, OTR/L [facility] [phone] Marisol Giambra Rosemarie Beath, OT 12/06/2023, 8:07 AM

## 2023-12-05 NOTE — Patient Instructions (Signed)
  1) AROM: Lateral Neck Flexion   Slowly tilt head toward one shoulder, then the other. Hold each position ____ seconds. Repeat ____ times per set. Do ____ sets per session. Do ____ sessions per day.  http://orth.exer.us/296   Copyright  VHI. All rights reserved.  2) AROM: Neck Extension   Bend head backward. Hold ____ seconds. Repeat ____ times per set. Do ____ sets per session. Do ____ sessions per day.  http://orth.exer.us/300   Copyright  VHI. All rights reserved.  3) AROM: Neck Flexion   Bend head forward. Hold ____ seconds. Repeat ____ times per set. Do ____ sets per session. Do ____ sessions per day.  http://orth.exer.us/298   Copyright  VHI. All rights reserved.  4) AROM: Neck Rotation   Turn head slowly to look over one shoulder, then the other. Hold each position ____ seconds. Repeat ____ times per set. Do ____ sets per session. Do ____ sessions per day.  http://orth.exer.us/294   Copyright  VHI. All rights reserved.    1) SHOULDER: Flexion On Table   Place hands on towel placed on table, elbows straight. Lean forward with you upper body, pushing towel away from body.  ___ reps per set, ___ sets per day  2) Abduction (Passive)   With arm out to side, resting on towel placed on table with palm DOWN, keeping trunk away from table, lean to the side while pushing towel away from body.  Repeat ____ times. Do ____ sessions per day.  Copyright  VHI. All rights reserved.     3) Internal Rotation (Assistive)   Seated with elbow bent at right angle and held against side, slide arm on table surface in an inward arc keeping elbow anchored in place. Repeat ____ times. Do ____ sessions per day. Activity: Use this motion to brush crumbs off the table.  Copyright  VHI. All rights reserved.   Perform each exercise ____10-15____ reps. 2-3x days.   1) Protraction   Start by holding a wand or cane at chest height.  Next, slowly push the wand outwards in  front of your body so that your elbows become fully straightened. Then, return to the original position.     2) Shoulder FLEXION   In the standing position, hold a wand/cane with both arms, palms down on both sides. Raise up the wand/cane allowing your unaffected arm to perform most of the effort. Your affected arm should be partially relaxed.      3) Internal/External ROTATION   In the standing position, hold a wand/cane with both hands keeping your elbows bent. Move your arms and wand/cane to one side.  Your affected arm should be partially relaxed while your unaffected arm performs most of the effort.       4) Shoulder ABDUCTION   While holding a wand/cane palm face up on the injured side and palm face down on the uninjured side, slowly raise up your injured arm to the side.        5) Horizontal Abduction/Adduction      Straight arms holding cane at shoulder height, bring cane to right, center, left. Repeat starting to left.   Copyright  VHI. All rights reserved.

## 2023-12-11 ENCOUNTER — Encounter (HOSPITAL_COMMUNITY): Payer: Self-pay

## 2023-12-11 ENCOUNTER — Emergency Department (HOSPITAL_COMMUNITY)

## 2023-12-11 ENCOUNTER — Emergency Department (HOSPITAL_COMMUNITY)
Admission: EM | Admit: 2023-12-11 | Discharge: 2023-12-12 | Disposition: A | Discharge status: Home / Self Care | Attending: Emergency Medicine | Admitting: Emergency Medicine

## 2023-12-11 ENCOUNTER — Ambulatory Visit (INDEPENDENT_AMBULATORY_CARE_PROVIDER_SITE_OTHER)
Admission: EM | Admit: 2023-12-11 | Discharge: 2023-12-11 | Disposition: A | Discharge status: Home / Self Care | Source: Home / Self Care

## 2023-12-11 DIAGNOSIS — K5792 Diverticulitis of intestine, part unspecified, without perforation or abscess without bleeding: Secondary | ICD-10-CM | POA: Insufficient documentation

## 2023-12-11 DIAGNOSIS — Z79899 Other long term (current) drug therapy: Secondary | ICD-10-CM | POA: Diagnosis not present

## 2023-12-11 DIAGNOSIS — R1032 Left lower quadrant pain: Secondary | ICD-10-CM | POA: Diagnosis present

## 2023-12-11 DIAGNOSIS — I10 Essential (primary) hypertension: Secondary | ICD-10-CM | POA: Insufficient documentation

## 2023-12-11 LAB — COMPREHENSIVE METABOLIC PANEL
ALT: 51 U/L — ABNORMAL HIGH (ref 0–44)
AST: 40 U/L (ref 15–41)
Albumin: 4.5 g/dL (ref 3.5–5.0)
Alkaline Phosphatase: 59 U/L (ref 38–126)
Anion gap: 14 (ref 5–15)
BUN: 13 mg/dL (ref 6–20)
CO2: 25 mmol/L (ref 22–32)
Calcium: 9.7 mg/dL (ref 8.9–10.3)
Chloride: 99 mmol/L (ref 98–111)
Creatinine, Ser: 0.75 mg/dL (ref 0.44–1.00)
GFR, Estimated: >60 mL/min (ref >60)
Glucose, Bld: 100 mg/dL — ABNORMAL HIGH (ref 70–99)
Potassium: 3.8 mmol/L (ref 3.5–5.1)
Sodium: 138 mmol/L (ref 135–145)
Total Bilirubin: 0.6 mg/dL (ref 0.0–1.2)
Total Protein: 7.9 g/dL (ref 6.5–8.1)

## 2023-12-11 LAB — URINALYSIS, ROUTINE W REFLEX MICROSCOPIC
Bacteria, UA: NONE SEEN
Bilirubin Urine: NEGATIVE
Glucose, UA: NEGATIVE mg/dL
Ketones, ur: NEGATIVE mg/dL
Leukocytes,Ua: NEGATIVE
Nitrite: NEGATIVE
Protein, ur: NEGATIVE mg/dL
Specific Gravity, Urine: 1.012 (ref 1.005–1.030)
pH: 5 (ref 5.0–8.0)

## 2023-12-11 LAB — POCT URINALYSIS DIP (MANUAL ENTRY)
Bilirubin, UA: NEGATIVE
Blood, UA: NEGATIVE
Glucose, UA: NEGATIVE mg/dL
Ketones, POC UA: NEGATIVE mg/dL
Nitrite, UA: NEGATIVE
Protein Ur, POC: NEGATIVE mg/dL
Spec Grav, UA: 1.02 (ref 1.010–1.025)
Urobilinogen, UA: 0.2 U/dL
pH, UA: 7 (ref 5.0–8.0)

## 2023-12-11 LAB — CBC
HCT: 41.2 % (ref 36.0–46.0)
Hemoglobin: 13.3 g/dL (ref 12.0–15.0)
MCH: 30.3 pg (ref 26.0–34.0)
MCHC: 32.3 g/dL (ref 30.0–36.0)
MCV: 93.8 fL (ref 80.0–100.0)
Platelets: 291 10*3/uL (ref 150–400)
RBC: 4.39 MIL/uL (ref 3.87–5.11)
RDW: 14.4 % (ref 11.5–15.5)
WBC: 9.7 10*3/uL (ref 4.0–10.5)
nRBC: 0 % (ref 0.0–0.2)

## 2023-12-11 LAB — LIPASE, BLOOD: Lipase: 37 U/L (ref 11–51)

## 2023-12-11 MED ORDER — METRONIDAZOLE 500 MG/100ML IV SOLN
500.0000 mg | Freq: Once | INTRAVENOUS | Status: AC
  Administered 2023-12-11: 500 mg via INTRAVENOUS
  Filled 2023-12-11: qty 100

## 2023-12-11 MED ORDER — IOHEXOL 300 MG/ML  SOLN
100.0000 mL | Freq: Once | INTRAMUSCULAR | Status: AC | PRN
  Administered 2023-12-11: 100 mL via INTRAVENOUS

## 2023-12-11 MED ORDER — ONDANSETRON HCL 4 MG/2ML IJ SOLN
4.0000 mg | Freq: Once | INTRAMUSCULAR | Status: AC
  Administered 2023-12-11: 4 mg via INTRAVENOUS
  Filled 2023-12-11: qty 2

## 2023-12-11 MED ORDER — OXYCODONE-ACETAMINOPHEN 5-325 MG PO TABS
1.0000 | ORAL_TABLET | Freq: Four times a day (QID) | ORAL | 0 refills | Status: DC | PRN
Start: 2023-12-11 — End: 2024-01-23
  Filled 2023-12-11 – 2023-12-12 (×3): qty 20, 5d supply, fill #0

## 2023-12-11 MED ORDER — SODIUM CHLORIDE 0.9 % IV SOLN
2.0000 g | Freq: Once | INTRAVENOUS | Status: AC
  Administered 2023-12-11: 2 g via INTRAVENOUS
  Filled 2023-12-11: qty 20

## 2023-12-11 MED ORDER — METRONIDAZOLE 500 MG PO TABS
500.0000 mg | ORAL_TABLET | Freq: Four times a day (QID) | ORAL | 0 refills | Status: DC
  Filled 2023-12-11: qty 40, 10d supply, fill #0

## 2023-12-11 MED ORDER — CIPROFLOXACIN HCL 500 MG PO TABS
500.0000 mg | ORAL_TABLET | Freq: Two times a day (BID) | ORAL | 0 refills | Status: DC
  Filled 2023-12-11: qty 20, 10d supply, fill #0

## 2023-12-11 MED ORDER — HYDROMORPHONE HCL 1 MG/ML IJ SOLN
0.5000 mg | Freq: Once | INTRAMUSCULAR | Status: AC
  Administered 2023-12-11: 0.5 mg via INTRAVENOUS
  Filled 2023-12-11: qty 0.5

## 2023-12-11 NOTE — ED Notes (Signed)
 Patient is being discharged from the Urgent Care and sent to the Emergency Department via private vehicle . Per NP, patient is in need of higher level of care due to ABD pain. Patient is aware and verbalizes understanding of plan of care.  Vitals:   12/11/23 1736  BP: (!) 174/75  Pulse: 82  Resp: 18  Temp: 98.1 F (36.7 C)  SpO2: 95%

## 2023-12-11 NOTE — Discharge Instructions (Signed)
 Go to the emergency department for further evaluation.

## 2023-12-11 NOTE — ED Provider Notes (Signed)
 [place] EMERGENCY DEPARTMENT AT [facility] Provider Note   CSN: [id number] Arrival date & time: 12/11/23  1826     History {Add pertinent medical, surgical, social history, OB history to HPI:1} Chief Complaint  Patient presents with   Flank Pain    Christine Mason is a 53 y.o. female.  Patient complains of left lower quadrant pain.  She has had a history of diverticulitis before.  Patient has a history of hypertension   Flank Pain       Home Medications Prior to Admission medications   Medication Sig Start Date End Date Taking? Authorizing Provider  ciprofloxacin (CIPRO) 500 MG tablet Take 1 tablet (500 mg total) by mouth 2 (two) times daily. One po bid x 7 days 12/11/23  Yes Bethann Berkshire, MD  metroNIDAZOLE (FLAGYL) 500 MG tablet Take 1 tablet (500 mg total) by mouth 4 (four) times daily. 12/11/23  Yes Bethann Berkshire, MD  oxyCODONE-acetaminophen (PERCOCET/ROXICET) 5-325 MG tablet Take 1 tablet by mouth every 6 (six) hours as needed for severe pain (pain score 7-10). 12/11/23  Yes Bethann Berkshire, MD  atenolol (TENORMIN) 50 MG tablet Take 1 tablet (50 mg total) by mouth daily. Patient taking differently: Take 50 mg by mouth at bedtime. 04/19/18   Serena Croissant, MD  Cholecalciferol (VITAMIN D3) 50 MCG (2000 UT) TABS Take 2,000 Units by mouth daily.    [provider]  desvenlafaxine (PRISTIQ) 100 MG 24 hr tablet Take 1 tablet (100 mg total) by mouth daily. 05/02/22   Serena Croissant, MD  desvenlafaxine (PRISTIQ) 100 MG 24 hr tablet Take 1 tablet by mouth daily. 05/30/23   [provider]  EPINEPHrine 0.3 mg/0.3 mL IJ SOAJ injection Inject 0.3 mg into the muscle as needed for anaphylaxis. 03/08/22   Hetty Blend, FNP  gabapentin (NEURONTIN) 300 MG capsule Take 1 capsule (300 mg total) by mouth at bedtime. 11/22/23   Vickki Hearing, MD  levocetirizine (XYZAL) 5 MG tablet Take 1 tablet (5 mg total) by mouth daily. 03/03/23   Scarlette Ar, MD   levothyroxine (SYNTHROID) 200 MCG tablet Take 1 tablet (200 mcg total) by mouth daily. 06/20/23   Shamleffer, Konrad Dolores, MD  levothyroxine (SYNTHROID) 50 MCG tablet Take 0.5 tablets (25 mcg total) by mouth daily before breakfast. 06/20/23   Shamleffer, Konrad Dolores, MD  LORazepam (ATIVAN) 1 MG tablet Take 1 mg by mouth at bedtime.  04/02/20   [provider]  losartan (COZAAR) 100 MG tablet Take 100 mg by mouth daily.  12/09/18   [provider]  metFORMIN (GLUCOPHAGE-XR) 500 MG 24 hr tablet Take 1 tablet by mouth daily. 11/28/23   [provider]  Multiple Vitamin (MULTIVITAMIN) tablet Take 2 tablets by mouth daily. gummy    [provider]  NON FORMULARY Pt uses a cpap nightly    [provider]  ondansetron (ZOFRAN-ODT) 4 MG disintegrating tablet Take 1 tablet (4 mg total) by mouth every 8 (eight) hours as needed for nausea or vomiting. 02/16/23   Rodriguez-Southworth, Nettie Elm, PA-C  oxybutynin (DITROPAN-XL) 10 MG 24 hr tablet Take 10 mg by mouth 2 (two) times daily.    [provider]  pantoprazole (PROTONIX) 40 MG tablet Take 40 mg by mouth 2 (two) times daily.    [provider]  rosuvastatin (CRESTOR) 10 MG tablet Take 10 mg by mouth daily. 11/09/20   [provider]  spironolactone-hydrochlorothiazide (ALDACTAZIDE) 25-25 MG tablet Take 1 tablet by mouth every morning. 06/11/23  Yates Decamp, MD  tiZANidine (ZANAFLEX) 4 MG tablet Take 1 tablet (4 mg total) by mouth every 8 (eight) hours as needed for 10 days. 02/17/22     triamcinolone ointment (KENALOG) 0.1 % Apply 1 Application topically 2 (two) times daily. Apply sparingly up to twice daily as needed for itch 07/04/23   Valentino Nose, NP  traZODone (DESYREL) 50 MG tablet Take 1 tablet (50 mg total) by mouth at bedtime. 09/26/22 09/27/22        Allergies    Penicillins    Review of Systems   Review of Systems  Genitourinary:  Positive for flank pain.     Physical Exam Updated Vital Signs BP (!) 162/112   Pulse 79   Temp 97.6 F (36.4 C) (Oral)   Resp 18   LMP 05/22/2015   SpO2 94%  Physical Exam  ED Results / Procedures / Treatments   Labs (all labs ordered are listed, but only abnormal results are displayed) Labs Reviewed  COMPREHENSIVE METABOLIC PANEL - Abnormal; Notable for the following components:      Result Value   Glucose, Bld 100 (*)    ALT 51 (*)    All other components within normal limits  URINALYSIS, ROUTINE W REFLEX MICROSCOPIC - Abnormal; Notable for the following components:   Hgb urine dipstick SMALL (*)    All other components within normal limits  LIPASE, BLOOD  CBC    EKG None  Radiology CT ABDOMEN PELVIS W CONTRAST Result Date: 12/11/2023 CLINICAL DATA:  Abdominal pain, acute, nonlocalized. EXAM: CT ABDOMEN AND PELVIS WITH CONTRAST TECHNIQUE: Multidetector CT imaging of the abdomen and pelvis was performed using the standard protocol following bolus administration of intravenous contrast. RADIATION DOSE REDUCTION: This exam was performed according to the departmental dose-optimization program which includes automated exposure control, adjustment of the mA and/or kV according to patient size and/or use of iterative reconstruction technique. CONTRAST:  OMNIPAQUE IOHEXOL 300 MG/ML  SOLN COMPARISON:  05/07/2018. FINDINGS: Lower chest: No acute abnormality. Hepatobiliary: No focal liver abnormality is seen. The liver is enlarged and there is elevation of the right diaphragm. Status post cholecystectomy. No biliary dilatation. Pancreas: Unremarkable. No pancreatic ductal dilatation or surrounding inflammatory changes. Spleen: Normal in size without focal abnormality. Adrenals/Urinary Tract: The adrenal glands are within normal limits. The kidneys enhance symmetrically. Cysts are present in the kidneys bilaterally. No renal calculus or obstructive uropathy. The bladder is unremarkable. Stomach/Bowel: There is  a small hiatal hernia. Stomach is within normal limits. Appendix appears normal. Scattered diverticula are present along the colon. There is colonic wall thickening with pericolonic fat stranding in the left lower quadrant involving the junction of the descending and sigmoid colon. No bowel obstruction, free air or pneumatosis is seen. No abscess is seen. Vascular/Lymphatic: No significant vascular findings are present. No enlarged abdominal or pelvic lymph nodes. Reproductive: A calcified exophytic nodule is noted at the uterine fundus on the left, possible fibroid. No adnexal mass is seen. Other: Periumbilical fat containing umbilical hernias are noted. No abdominopelvic ascites. Musculoskeletal: Degenerative changes are present in the thoracolumbar spine. No acute osseous abnormality is seen. IMPRESSION: 1. Findings compatible with diverticulitis involving the junction of the descending and sigmoid colon in the left lower quadrant. 2. Small hiatal hernia. 3. Mild hepatomegaly. Electronically Signed   By: Thornell Sartorius M.D.   On: 12/11/2023 21:26    Procedures Procedures  {Document cardiac monitor, telemetry assessment procedure when appropriate:1}  Medications Ordered in ED Medications  cefTRIAXone (ROCEPHIN) 2 g in sodium chloride 0.9 % 100 mL IVPB (2 g Intravenous New Bag/Given 12/11/23 2243)    And  metroNIDAZOLE (FLAGYL) IVPB 500 mg (has no administration in time range)  HYDROmorphone (DILAUDID) injection 0.5 mg (0.5 mg Intravenous Given 12/11/23 2054)  ondansetron (ZOFRAN) injection 4 mg (4 mg Intravenous Given 12/11/23 2054)  iohexol (OMNIPAQUE) 300 MG/ML solution 100 mL (100 mLs Intravenous Contrast Given 12/11/23 2107)    ED Course/ Medical Decision Making/ A&P   {   Click here for ABCD2, HEART and other calculatorsREFRESH Note before signing :1}                              Medical Decision Making Amount and/or Complexity of Data Reviewed Labs: ordered. Radiology:  ordered.  Risk Prescription drug management.   Patient with diverticulitis.  We will send her home with Cipro and Flagyl and pain medicine and she will follow-up with her PCP  {Document critical care time when appropriate:1} {Document review of labs and clinical decision tools ie heart score, Chads2Vasc2 etc:1}  {Document your independent review of radiology images, and any outside records:1} {Document your discussion with family members, caretakers, and with consultants:1} {Document social determinants of health affecting pt's care:1} {Document your decision making why or why not admission, treatments were needed:1} Final Clinical Impression(s) / ED Diagnoses Final diagnoses:  Diverticulitis    Rx / DC Orders ED Discharge Orders          Ordered    ciprofloxacin (CIPRO) 500 MG tablet  2 times daily        12/11/23 2256    metroNIDAZOLE (FLAGYL) 500 MG tablet  4 times daily        12/11/23 2256    oxyCODONE-acetaminophen (PERCOCET/ROXICET) 5-325 MG tablet  Every 6 hours PRN        12/11/23 2256

## 2023-12-11 NOTE — ED Provider Notes (Signed)
 [facility]    CSN: [id number] Arrival date & time: 12/11/23  1724      History   Chief Complaint No chief complaint on file.   HPI JENISSE VULLO is a 53 y.o. female.   The history is provided by the patient.   Patient presents for complaints of left lower quadrant abdominal pain that started this morning.  She rates her pain 10/10 at present, describes pain as "sharp."  Denies fever, chills, chest pain, nausea, vomiting, diarrhea, gas, bloating, or urinary symptoms.  Reports that she has had 2 bowel movements today.  Patient with history of diverticulitis.  States pain has worsened since the onset.  Patient states that she took hydrocodone and a muscle relaxer for her pain with minimal relief.  Past Medical History:  Diagnosis Date   Anxiety    Arthritis    Back pain    Carpal tunnel syndrome    bi lat hands   Chronic leg pain    right   Chronic pain of left knee    Depression    Diverticulitis    Fatty liver    GERD (gastroesophageal reflux disease)    Heart murmur    High cholesterol    History of kidney stones    Hypertension    Hypothyroidism    Joint pain    Pre-diabetes    Sleep apnea     no C- Pap   Swallowing difficulty    Thyroid cancer [facility])     Patient Active Problem List   Diagnosis Date Noted   Thyroid cancer (HCC) 02/28/2023   Multiple thyroid nodules 08/24/2022   Hepatic vein stenosis 08/24/2022   Depression 07/11/2022   Other fatigue 06/27/2022   SOBOE (shortness of breath on exertion) 06/27/2022   Osteoarthritis of both knees 06/27/2022   Pre-diabetes 06/27/2022   Other hyperlipidemia 06/27/2022   Depression screening 06/27/2022   Seasonal and perennial allergic rhinitis 03/08/2022   Wheeze 03/08/2022   Gastroesophageal reflux disease 03/08/2022   Hx of papillary thyroid carcinoma 04/14/2021   Status post total thyroidectomy 04/14/2021   Unilateral primary osteoarthritis, left knee 03/24/2021   Chronic pain of left knee  03/24/2021   Morbid obesity (HCC) 02/09/2021   Abnormal liver function tests 02/09/2021   Chest pain 02/09/2021   Chronic low back pain 02/09/2021   Generalized hyperhidrosis 02/09/2021   Major depressive disorder 02/09/2021   Palpitations 02/09/2021   Urinary incontinence 02/09/2021   Impaired fasting glucose 02/01/2021   Essential hypertension 02/01/2021   Mixed hyperlipidemia 02/01/2021   Vitamin D deficiency 02/01/2021   Hypothyroidism 01/12/2021   Hypokalemia 01/12/2021   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   Neoplasm of uncertain behavior of thyroid gland 06/29/2020   Oropharyngeal dysphagia 05/06/2019   Dysphagia, pharyngoesophageal phase 05/05/2019   Vasomotor symptoms due to menopause 03/13/2019   Genetic testing 06/03/2015   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    Past Surgical History:  Procedure Laterality Date   ACNE CYST REMOVAL     BREAST BIOPSY Right 12/22/2022   AGGREGATED APOCRINE CYSTS- NEGATIVE FOR MALIGNANCY of the RIGHT breast   CHOLECYSTECTOMY     COLONOSCOPY     DILATION AND CURETTAGE OF UTERUS  1998   ESOPHAGOGASTRODUODENOSCOPY (EGD) WITH PROPOFOL N/A 06/13/2019   Procedure: ESOPHAGOGASTRODUODENOSCOPY (EGD) WITH PROPOFOL;  Surgeon: Malissa Hippo, MD;  Location: [facility];  Service: Endoscopy;  Laterality: N/A;  7:30   KNEE SURGERY Left    Meniscus Tear  MALONEY DILATION  06/13/2019   Procedure: MALONEY DILATION;  Surgeon: Malissa Hippo, MD;  Location: [facility];  Service: Endoscopy;;   MASS EXCISION     RADICAL NECK DISSECTION Left 02/28/2023   Procedure: MODIFIED NECK DISSECTION;  Surgeon: Serena Colonel, MD;  Location: [facility] OR;  Service: ENT;  Laterality: Left;   THORACOTOMY Right 2016   THYROIDECTOMY N/A 07/01/2020   Procedure: TOTAL THYROIDECTOMY WITH Central compartment lymph node disection;  Surgeon: Darnell Level, MD;  Location: [facility];  Service: General;  Laterality: N/A;    OB History      Gravida  1   Para      Term      Preterm      AB  1   Living  0      SAB  1   IAB      Ectopic      Multiple      Live Births               Home Medications    Prior to Admission medications   Medication Sig Start Date End Date Taking? Authorizing Provider  metFORMIN (GLUCOPHAGE-XR) 500 MG 24 hr tablet Take 1 tablet by mouth daily. 11/28/23  Yes [provider]  atenolol (TENORMIN) 50 MG tablet Take 1 tablet (50 mg total) by mouth daily. Patient taking differently: Take 50 mg by mouth at bedtime. 04/19/18   Serena Croissant, MD  Cholecalciferol (VITAMIN D3) 50 MCG (2000 UT) TABS Take 2,000 Units by mouth daily.    [provider]  desvenlafaxine (PRISTIQ) 100 MG 24 hr tablet Take 1 tablet (100 mg total) by mouth daily. 05/02/22   Serena Croissant, MD  desvenlafaxine (PRISTIQ) 100 MG 24 hr tablet Take 1 tablet by mouth daily. 05/30/23   [provider]  EPINEPHrine 0.3 mg/0.3 mL IJ SOAJ injection Inject 0.3 mg into the muscle as needed for anaphylaxis. 03/08/22   Hetty Blend, FNP  gabapentin (NEURONTIN) 300 MG capsule Take 1 capsule (300 mg total) by mouth at bedtime. 11/22/23   Vickki Hearing, MD  HYDROcodone-acetaminophen (NORCO/VICODIN) 5-325 MG tablet Take 1 tablet by mouth every 6 (six) hours as needed.    [provider]  levocetirizine (XYZAL) 5 MG tablet Take 1 tablet (5 mg total) by mouth daily. 03/03/23   Scarlette Ar, MD  levothyroxine (SYNTHROID) 200 MCG tablet Take 1 tablet (200 mcg total) by mouth daily. 06/20/23   Shamleffer, Konrad Dolores, MD  levothyroxine (SYNTHROID) 50 MCG tablet Take 0.5 tablets (25 mcg total) by mouth daily before breakfast. 06/20/23   Shamleffer, Konrad Dolores, MD  LORazepam (ATIVAN) 1 MG tablet Take 1 mg by mouth at bedtime.  04/02/20   [provider]  losartan (COZAAR) 100 MG tablet Take 100 mg by mouth daily.  12/09/18   [provider]  Multiple Vitamin (MULTIVITAMIN) tablet Take  2 tablets by mouth daily. gummy    [provider]  NON FORMULARY Pt uses a cpap nightly    [provider]  ondansetron (ZOFRAN-ODT) 4 MG disintegrating tablet Take 1 tablet (4 mg total) by mouth every 8 (eight) hours as needed for nausea or vomiting. 02/16/23   Rodriguez-Southworth, Nettie Elm, PA-C  oxybutynin (DITROPAN-XL) 10 MG 24 hr tablet Take 10 mg by mouth 2 (two) times daily.    [provider]  pantoprazole (PROTONIX) 40 MG tablet Take 40 mg by mouth 2 (two) times daily.    [provider]  rosuvastatin (  CRESTOR) 10 MG tablet Take 10 mg by mouth daily. 11/09/20   [provider]  spironolactone-hydrochlorothiazide (ALDACTAZIDE) 25-25 MG tablet Take 1 tablet by mouth every morning. 06/11/23   Yates Decamp, MD  tiZANidine (ZANAFLEX) 4 MG tablet Take 1 tablet (4 mg total) by mouth every 8 (eight) hours as needed for 10 days. 02/17/22     triamcinolone ointment (KENALOG) 0.1 % Apply 1 Application topically 2 (two) times daily. Apply sparingly up to twice daily as needed for itch 07/04/23   Valentino Nose, NP  traZODone (DESYREL) 50 MG tablet Take 1 tablet (50 mg total) by mouth at bedtime. 09/26/22 09/27/22      Family History Family History  Problem Relation Age of Onset   Sleep apnea Mother    Cancer Mother    Hyperlipidemia Mother    Diabetes Mother    Hypertension Mother    Breast cancer Mother 37       dx again at 21; PALB2 and CHEK2 positive   Depression Mother    Obesity Mother    Stroke Father    Depression Father    Obesity Father    Hyperlipidemia Father    Diabetes Father    Hypertension Father    Coronary artery disease Father    Sleep apnea Father    Heart disease Father    Hypertension Sister    Breast cancer Sister        diagnosed in her 55's   Lung cancer Maternal Aunt 55   Thyroid cancer Maternal Uncle 46       medullary   Lymphoma Maternal Uncle 60       follicular lymphoma   Heart attack Maternal Uncle    Lung  cancer Maternal Grandmother    Leukemia Maternal Grandfather 10       dx with hairy cell leukemia at 51; CLL at 40   Lymphoma Maternal Grandfather 81       hodgkins lymphoma   Brain cancer Paternal Grandmother    Prostate cancer Paternal Grandfather    Allergic rhinitis Neg Hx    Asthma Neg Hx     Social History Social History   Tobacco Use   Smoking status: Former    Current packs/day: 0.00    Average packs/day: 0.5 packs/day for 5.0 years (2.5 ttl pk-yrs)    Types: Cigarettes    Start date: 09/15/2012    Quit date: 09/15/2017    Years since quitting: 6.2   Smokeless tobacco: Never  Vaping Use   Vaping status: Never Used  Substance Use Topics   Alcohol use: No   Drug use: No     Allergies   Penicillins   Review of Systems Review of Systems Per HPI  Physical Exam Triage Vital Signs ED Triage Vitals  Encounter Vitals Group     BP 12/11/23 1736 (!) 174/75     Systolic BP Percentile --      Diastolic BP Percentile --      Pulse Rate 12/11/23 1736 82     Resp 12/11/23 1736 18     Temp 12/11/23 1736 98.1 F (36.7 C)     Temp src --      SpO2 12/11/23 1736 95 %     Weight --      Height --      Head Circumference --      Peak Flow --      Pain Score 12/11/23 1737 8     Pain Loc --  Pain Education --      Exclude from Growth Chart --    No data found.  Updated Vital Signs BP (!) 174/75 (BP Location: Right Wrist)   Pulse 82   Temp 98.1 F (36.7 C)   Resp 18   LMP 05/22/2015   SpO2 95%   Visual Acuity Right Eye Distance:   Left Eye Distance:   Bilateral Distance:    Right Eye Near:   Left Eye Near:    Bilateral Near:     Physical Exam Vitals and nursing note reviewed.  Constitutional:      General: She is not in acute distress.    Appearance: Normal appearance.  HENT:     Head: Normocephalic.  Eyes:     Extraocular Movements: Extraocular movements intact.     Pupils: Pupils are equal, round, and reactive to light.  Cardiovascular:      Rate and Rhythm: Normal rate and regular rhythm.     Pulses: Normal pulses.     Heart sounds: Normal heart sounds.  Pulmonary:     Effort: Pulmonary effort is normal. No respiratory distress.     Breath sounds: Normal breath sounds. No stridor. No wheezing, rhonchi or rales.  Abdominal:     General: Bowel sounds are normal.     Palpations: Abdomen is soft.     Tenderness: There is abdominal tenderness in the left lower quadrant.  Musculoskeletal:     Cervical back: Normal range of motion.  Skin:    General: Skin is warm and dry.  Neurological:     General: No focal deficit present.     Mental Status: She is alert and oriented to person, place, and time.  Psychiatric:        Mood and Affect: Mood normal.        Behavior: Behavior normal.      UC Treatments / Results  Labs (all labs ordered are listed, but only abnormal results are displayed) Labs Reviewed  POCT URINALYSIS DIP (MANUAL ENTRY) - Abnormal; Notable for the following components:      Result Value   Leukocytes, UA Trace (*)    All other components within normal limits  URINE CULTURE    EKG   Radiology No results found.  Procedures Procedures (including critical care time)  Medications Ordered in UC Medications - No data to display  Initial Impression / Assessment and Plan / UC Course  I have reviewed the triage vital signs and the nursing notes.  Pertinent labs & imaging results that were available during my care of the patient were reviewed by me and considered in my medical decision making (see chart for details).  Urinalysis does not show any signs of infection.  Patient with left lower quadrant abdominal pain that she rates 10/10 at present.  Given the limitations of resources in this clinic, and based on patient's current presentation, patient was advised it is recommended that she go to the emergency department for further evaluation.  Patient was in agreement with this plan of care and  verbalized understanding.  Patient's vital signs are mostly stable, she is able to travel via private vehicle.  Patient discharged to the emergency department for further evaluation.   Final Clinical Impressions(s) / UC Diagnoses   Final diagnoses:  Abdominal pain, left lower quadrant   Discharge Instructions   None    ED Prescriptions   None    PDMP not reviewed this encounter.   Abran Cantor, NP 12/11/23 2036

## 2023-12-11 NOTE — ED Triage Notes (Signed)
 Pt reports she has some left side abdominal pain that started today.   Reports 2 Bms made today

## 2023-12-11 NOTE — ED Triage Notes (Signed)
 Patient came to ED via [facility] with c/o of left flank pain. Patient states pain 8/10. History of diverticulitis and feels like it is similar to previous flare up. No N/V or diarrhea.

## 2023-12-11 NOTE — Discharge Instructions (Signed)
 Follow-up with your family doctor next week.  Return sooner if worsening pain or fever or vomiting

## 2023-12-12 ENCOUNTER — Other Ambulatory Visit: Payer: Self-pay

## 2023-12-12 ENCOUNTER — Other Ambulatory Visit (HOSPITAL_COMMUNITY): Payer: Self-pay

## 2023-12-12 ENCOUNTER — Other Ambulatory Visit (HOSPITAL_BASED_OUTPATIENT_CLINIC_OR_DEPARTMENT_OTHER): Payer: Self-pay

## 2023-12-12 LAB — URINE CULTURE: Culture: NO GROWTH

## 2023-12-19 ENCOUNTER — Encounter: Payer: Self-pay | Admitting: Allergy & Immunology

## 2023-12-19 ENCOUNTER — Ambulatory Visit (INDEPENDENT_AMBULATORY_CARE_PROVIDER_SITE_OTHER): Payer: Self-pay | Admitting: Allergy & Immunology

## 2023-12-19 ENCOUNTER — Ambulatory Visit: Payer: 59 | Admitting: Internal Medicine

## 2023-12-19 VITALS — BP 132/98 | HR 88 | Temp 97.9°F | Resp 20 | Ht 60.43 in | Wt 351.0 lb

## 2023-12-19 DIAGNOSIS — L299 Pruritus, unspecified: Secondary | ICD-10-CM | POA: Diagnosis not present

## 2023-12-19 DIAGNOSIS — L2089 Other atopic dermatitis: Secondary | ICD-10-CM

## 2023-12-19 DIAGNOSIS — J3089 Other allergic rhinitis: Secondary | ICD-10-CM

## 2023-12-19 DIAGNOSIS — J302 Other seasonal allergic rhinitis: Secondary | ICD-10-CM

## 2023-12-19 DIAGNOSIS — R062 Wheezing: Secondary | ICD-10-CM

## 2023-12-19 DIAGNOSIS — K219 Gastro-esophageal reflux disease without esophagitis: Secondary | ICD-10-CM

## 2023-12-19 MED ORDER — TRIAMCINOLONE ACETONIDE 0.1 % EX OINT: 1.0000 | TOPICAL_OINTMENT | Freq: Two times a day (BID) | CUTANEOUS | 1 refills | Status: DC

## 2023-12-19 MED ORDER — TACROLIMUS 0.1 % EX OINT: TOPICAL_OINTMENT | Freq: Two times a day (BID) | CUTANEOUS | 1 refills | Status: DC

## 2023-12-19 NOTE — Progress Notes (Unsigned)
 FOLLOW UP  Date of Service/Encounter:  12/19/23   Assessment:   Seasonal and perennial allergic rhinitis (grasses, ragweed, weeds, indoor molds, dust mites and cat) - on allergen immunotherapy with maintenance reached December 2020, stopped March 2024   Itching - minimal improvement with allergen immunotherapy (stopped in March 2024)    Dysphagia - with a history of a benign esophageal mass in 2008   OSA - has CPAP in place, but she does not use it  History of thyroid papillary carcinoma   Plan/Recommendations:   1. Seasonal and perennial allergic rhinitis (grasses, ragweed, weeds, indoor molds, dust mites and cat) - with marked itching - I agree with stopping the allergy shots.  - Continue with the Xyzal for now.  - Consider Nemluvio for control of the itching (handout provided). - We are going to start triamcinolone 0.1% ointment twice daily as needed (DO NOT use on the face). - We are going to start tacrolimus 0.1% ointment twice daily as needed (OK to use on the face).  2. Wheeze - I think a lot of this might be related to uncontrolled reflux.  - Lung testing was a bit lower than it has been in the past.  - With the lack of improvement with the albuterol, I am not convinced that this is albuterol.   3. Gastroesophageal reflux disease - Continue with omeprazole, but increase to twice daily.  - Add on Pepcid 40mg  twice daily as well.   4. Return in about 3 months (around 03/19/2024). You can have the follow up appointment with Dr. Dellis Anes or a Nurse Practicioner (our Nurse Practitioners are excellent and always have Physician oversight!).   Subjective:   Christine Mason is a 53 y.o. female presenting today for follow up of  Chief Complaint  Patient presents with   Follow-up    Mostly wheezing at night     ADER Mason has a history of the following: Patient Active Problem List   Diagnosis Date Noted   Thyroid cancer (HCC) 02/28/2023   Multiple thyroid  nodules 08/24/2022   Hepatic vein stenosis 08/24/2022   Depression 07/11/2022   Other fatigue 06/27/2022   SOBOE (shortness of breath on exertion) 06/27/2022   Osteoarthritis of both knees 06/27/2022   Pre-diabetes 06/27/2022   Other hyperlipidemia 06/27/2022   Depression screening 06/27/2022   Seasonal and perennial allergic rhinitis 03/08/2022   Wheeze 03/08/2022   Gastroesophageal reflux disease 03/08/2022   Hx of papillary thyroid carcinoma 04/14/2021   Status post total thyroidectomy 04/14/2021   Unilateral primary osteoarthritis, left knee 03/24/2021   Chronic pain of left knee 03/24/2021   Morbid obesity (HCC) 02/09/2021   Abnormal liver function tests 02/09/2021   Chest pain 02/09/2021   Chronic low back pain 02/09/2021   Generalized hyperhidrosis 02/09/2021   Major depressive disorder 02/09/2021   Palpitations 02/09/2021   Urinary incontinence 02/09/2021   Impaired fasting glucose 02/01/2021   Essential hypertension 02/01/2021   Mixed hyperlipidemia 02/01/2021   Vitamin D deficiency 02/01/2021   Hypothyroidism 01/12/2021   Hypokalemia 01/12/2021   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   Neoplasm of uncertain behavior of thyroid gland 06/29/2020   Oropharyngeal dysphagia 05/06/2019   Dysphagia, pharyngoesophageal phase 05/05/2019   Vasomotor symptoms due to menopause 03/13/2019   Genetic testing 06/03/2015   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  Discussed the use of AI scribe software for clinical note transcription with the patient  and/or guardian, who gave verbal consent to proceed.  Christine Mason is a 53 y.o. female presenting for a follow up visit.  He was last seen in June 2023.  At that time, she was started on cetirizine 10 mg daily and continued on doxepin for her itching.  She also continue with Nasacort and her allergen immunotherapy.  For her wheezing, she was started on  Arnuity 1 puff once daily as well as albuterol as needed.  She also remained on pantoprazole.  Since last visit, she has done well.   Asthma/Respiratory Symptom History: She experiences wheezing primarily at night, although not every night, with occasional episodes during the day. She uses a nebulizer with albuterol liquid as needed, which she obtained about two weeks ago, after finding an inhaler difficult to maneuver. She sleeps in a recliner and does not use her regular bed. She has a history of smoking but quit in 2019. No episodes of being woken up by wheezing at night. She was a smoker years ago.   Skin Symptom History: She experiences chronic itching all over her body, which persists despite using Xyzal and Benadryl. She applies lotion regularly but finds no significant relief. She has a history of allergies to grasses, ragweed, weeds, indoor molds, cat, and dust mites, with dust mites being the most significant allergen. She has a cat at home but does not find it bothersome. She previously received allergy shots but did not notice any improvement.  GERD Symptom History: She has gastroesophageal reflux disease (GERD) and takes omeprazole once daily. She experiences throat clearing and cough, which she attributes to reflux.  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 She had a thyroidectomy in 2021 and has not had a recurrence since her surgery.  Otherwise, there have been no changes to her past medical history, surgical history, family history, or social history.    Review of systems otherwise negative other than that mentioned in the HPI.    Objective:   Blood pressure (!) 132/98, pulse 88, temperature 97.9 F (36.6 C), resp. rate 20, height 5' 0.43" (1.535 m), weight (!) 351 lb (159.2 kg), last menstrual period 05/22/2015, SpO2 96%. Body mass index is 67.57 kg/m.    Physical Exam Vitals  reviewed.  Constitutional:      Appearance: She is well-developed. She is obese.     Comments: Pleasant.   HENT:     Head: Normocephalic and atraumatic.     Right Ear: Tympanic membrane, ear canal and external ear normal.     Left Ear: Tympanic membrane, ear canal and external ear normal.     Nose: No nasal deformity, septal deviation, mucosal edema or rhinorrhea.     Right Turbinates: Enlarged, swollen and pale.     Left Turbinates: Enlarged, swollen and pale.     Right Sinus: No maxillary sinus tenderness or frontal sinus tenderness.     Left Sinus: No maxillary sinus tenderness or frontal sinus tenderness.     Mouth/Throat:     Mouth: Mucous membranes are not pale and not dry.     Pharynx: Uvula midline.  Eyes:     General:        Right eye: No discharge.        Left eye: No discharge.     Conjunctiva/sclera: Conjunctivae normal.     Right eye: Right conjunctiva is not injected. No chemosis.    Left eye:  Left conjunctiva is not injected. No chemosis.    Pupils: Pupils are equal, round, and reactive to light.  Cardiovascular:     Rate and Rhythm: Normal rate and regular rhythm.     Heart sounds: Normal heart sounds.  Pulmonary:     Effort: Pulmonary effort is normal. No tachypnea, accessory muscle usage or respiratory distress.     Breath sounds: Normal breath sounds. No wheezing, rhonchi or rales.     Comments: Moving air well in all lung fields. No increased work of breathing noted. Chest:     Chest wall: No tenderness.  Lymphadenopathy:     Cervical: No cervical adenopathy.  Skin:    General: Skin is warm.     Capillary Refill: Capillary refill takes less than 2 seconds.     Coloration: Skin is not pale.     Findings: No abrasion, erythema, petechiae or rash. Rash is not papular, urticarial or vesicular.     Comments: Some excoriations present.   Neurological:     Mental Status: She is alert.  Psychiatric:        Behavior: Behavior is cooperative.      Diagnostic  studies:    Spirometry: results abnormal (FEV1: 1.60/69%, FVC: 1.79/62%, FEV1/FVC: 89%).    Spirometry consistent with normal pattern.    Allergy Studies: none      Malachi Bonds, MD  [facility]

## 2023-12-19 NOTE — Patient Instructions (Addendum)
 1. Seasonal and perennial allergic rhinitis (grasses, ragweed, weeds, indoor molds, dust mites and cat) - with marked itching - I agree with stopping the allergy shots.  - Continue with the Xyzal for now.  - Consider Nemluvio for control of the itching (handout provided). - We are going to start triamcinolone 0.1% ointment twice daily as needed (DO NOT use on the face). - We are going to start tacrolimus 0.1% ointment twice daily as needed (OK to use on the face).  2. Wheeze - I think a lot of this might be related to uncontrolled reflux.  - Lung testing was a bit lower than it has been in the past.  - With the lack of improvement with the albuterol, I am not convinced that this is albuterol.   3. Gastroesophageal reflux disease - Continue with omeprazole, but increase to twice daily.  - Add on Pepcid 40mg  twice daily as well.   4. Return in about 3 months (around 03/19/2024). You can have the follow up appointment with Dr. Dellis Anes or a Nurse Practicioner (our Nurse Practitioners are excellent and always have Physician oversight!).    Please inform us of any Emergency Department visits, hospitalizations, or changes in symptoms. Call us before going to the ED for breathing or allergy symptoms since we might be able to fit you in for a sick visit. Feel free to contact us anytime with any questions, problems, or concerns.  It was a pleasure to see you again today!  Websites that have reliable patient information: 1. American Academy of Asthma, Allergy, and Immunology: www.aaaai.org 2. Food Allergy Research and Education (FARE): foodallergy.org 3. Mothers of Asthmatics: http://www.asthmacommunitynetwork.org 4. American College of Allergy, Asthma, and Immunology: www.acaai.org      "Like" [place] on Facebook and Instagram for our latest updates!      A healthy democracy works best when [employer] participate! Make sure you are registered to vote! If you have moved or changed any of your  contact information, you will need to get this updated before voting! Scan the QR codes below to learn more!

## 2023-12-20 NOTE — Addendum Note (Signed)
 Addended by: Alfonse Spruce on: 12/20/2023 08:54 AM   Modules accepted: Orders

## 2023-12-21 ENCOUNTER — Encounter (HOSPITAL_COMMUNITY): Admitting: Occupational Therapy

## 2024-01-02 ENCOUNTER — Encounter (HOSPITAL_COMMUNITY): Admitting: Occupational Therapy

## 2024-01-07 ENCOUNTER — Other Ambulatory Visit (HOSPITAL_COMMUNITY): Payer: Self-pay

## 2024-01-14 ENCOUNTER — Other Ambulatory Visit: Payer: Self-pay

## 2024-01-14 ENCOUNTER — Encounter: Payer: Self-pay | Admitting: Internal Medicine

## 2024-01-14 MED ORDER — LEVOTHYROXINE SODIUM 200 MCG PO TABS: 200.0000 ug | ORAL_TABLET | Freq: Every day | ORAL | 3 refills | Status: DC

## 2024-01-23 ENCOUNTER — Encounter: Payer: Self-pay | Admitting: Internal Medicine

## 2024-01-23 ENCOUNTER — Ambulatory Visit (INDEPENDENT_AMBULATORY_CARE_PROVIDER_SITE_OTHER): Payer: Self-pay | Admitting: Internal Medicine

## 2024-01-23 VITALS — BP 126/84 | HR 84 | Ht 60.43 in | Wt 353.0 lb

## 2024-01-23 DIAGNOSIS — E890 Postprocedural hypothyroidism: Secondary | ICD-10-CM

## 2024-01-23 DIAGNOSIS — C73 Malignant neoplasm of thyroid gland: Secondary | ICD-10-CM | POA: Diagnosis not present

## 2024-01-23 NOTE — Progress Notes (Unsigned)
 Name: Christine Mason  MRN/ DOB: [id number], Oct 30, 1970    Age/ Sex: 53 y.o., female     PCP: Omie Bickers, MD   Reason for Endocrinology Evaluation: Postoperative hypothyroidism/PTC     Initial Endocrinology Clinic Visit: 05/03/2020    PATIENT IDENTIFIER: Christine Mason is a 53 y.o., female with a past medical history of HTN, Hx of PTC. She has followed with [facility] since 05/03/2020 for consultative assistance with management of her postoperative hypothyroidism/PTC.   HISTORICAL SUMMARY: The patient was initially diagnosed with hyperthyroidism and MNG in 02/2020    She is S/P LLL nodule 02/2020 with cytology report consistent  with bethesda  category III , Afirma came back suspicious.   She is S/P total thyroidectomy 07/01/2020 with 1 cm PTC with  1 cm left thyroid  tumor, unifocal , margins uninvolved. 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  Status post benign FNA on 01/18/2022.   Thyroid  bed ultrasound 08/03/2022 showed interval enlargement of the left thyroid  nodule at 1.7 cm.  She is S/P FNA 08/03/2022, despite no malignant cells the evaluation was considered limited due to abundance of histiocytes and inflammatory cells.   WBS  09/2022 no evidence of local neck recurrence or metastases  CT neck 10/2022 revealed 1.2 cm x 1.5 cm partially calcified left level IV node suspicious for nodal metastatic disease  She was evaluated by ENT 11/2022 (Dr. Donalee Fruits) , she is s/p  left level II, III and IV  neck dissection 02/28/2023, 1 out of 26 lymph nodes were positive    Uncle with medullary cancer     SUBJECTIVE:    Today (01/23/2024):  Christine Mason is here for a  follow up on hypothyroidism and Hx of PTC.     She continues to weight gain Denies local neck swelling , just numbness  Has occasional  palpitations  Has occasional  tremors  Denies  diarrhea or loose stools  She continues with not wearing CPAP machine    Levothyroxine  200 mcg daily  Levothyroxine  25 mcg daily      HISTORY:  Past Medical History:  Past Medical History:  Diagnosis Date   Anxiety    Arthritis    Back pain    Carpal tunnel syndrome    bi lat hands   Chronic leg pain    right   Chronic pain of left knee    Depression    Diverticulitis    Fatty liver    GERD (gastroesophageal reflux disease)    Heart murmur    High cholesterol    History of kidney stones    Hypertension    Hypothyroidism    Joint pain    Pre-diabetes    Sleep apnea     no C- Pap   Swallowing difficulty    Thyroid  cancer [facility])    Past Surgical History:  Past Surgical History:  Procedure Laterality Date   ACNE CYST REMOVAL     BREAST BIOPSY Right 12/22/2022   AGGREGATED APOCRINE CYSTS- NEGATIVE FOR MALIGNANCY of the RIGHT breast   CHOLECYSTECTOMY     COLONOSCOPY     DILATION AND CURETTAGE OF UTERUS  1998   ESOPHAGOGASTRODUODENOSCOPY (EGD) WITH PROPOFOL  N/A 06/13/2019   Procedure: ESOPHAGOGASTRODUODENOSCOPY (EGD) WITH PROPOFOL ;  Surgeon: Ruby Corporal, MD;  Location: [facility];  Service: Endoscopy;  Laterality: N/A;  7:30   KNEE SURGERY Left    Meniscus Tear   MALONEY DILATION  06/13/2019   Procedure: MALONEY DILATION;  Surgeon: Ruby Corporal, MD;  Location: [facility];  Service: Endoscopy;;   MASS EXCISION     RADICAL NECK DISSECTION Left 02/28/2023   Procedure: MODIFIED NECK DISSECTION;  Surgeon: Janita Mellow, MD;  Location: [facility] OR;  Service: ENT;  Laterality: Left;   THORACOTOMY Right 2016   THYROIDECTOMY N/A 07/01/2020   Procedure: TOTAL THYROIDECTOMY WITH Central compartment lymph node disection;  Surgeon: Oralee Billow, MD;  Location: [facility] ORS;  Service:  General;  Laterality: N/A;   Social History:  reports that she quit smoking about 6 years ago. Her smoking use included cigarettes. She started smoking about 11 years ago. She has a 2.5 pack-year smoking history. She has never used smokeless tobacco. She reports that she does not drink alcohol  and does not use drugs. Family History:  Family History  Problem Relation Age of Onset   Sleep apnea Mother    Cancer Mother    Hyperlipidemia Mother    Diabetes Mother    Hypertension Mother    Breast cancer Mother 35       dx again at 30; PALB2 and CHEK2 positive   Depression Mother    Obesity Mother    Stroke Father    Depression Father    Obesity Father    Hyperlipidemia Father    Diabetes Father    Hypertension Father    Coronary artery disease Father    Sleep apnea Father    Heart disease Father    Hypertension Sister    Breast cancer Sister        diagnosed in her 23's   Lung cancer Maternal Aunt 57   Thyroid  cancer Maternal Uncle 46       medullary   Lymphoma Maternal Uncle 60       follicular lymphoma   Heart attack Maternal Uncle    Lung cancer Maternal Grandmother    Leukemia Maternal Grandfather 51       dx with hairy cell leukemia at 10; CLL at 52   Lymphoma Maternal Grandfather 49       hodgkins lymphoma   Brain cancer Paternal Grandmother    Prostate cancer Paternal Grandfather    Allergic rhinitis Neg Hx    Asthma Neg Hx      HOME MEDICATIONS: Allergies as of 01/23/2024       Reactions   Penicillins Rash   Has patient had a PCN reaction causing immediate rash, facial/tongue/throat swelling, SOB or lightheadedness with hypotension: {Yes Has patient had a PCN reaction causing severe rash involving mucus membranes or skin necrosis:unknown Has patient had a PCN reaction that required hospitalizationNo Has patient had a PCN reaction occurring within the last 10 years:No If all of the above answers are "NO", then may proceed with Cephalosporin use.         Medication List        Accurate as of Jan 23, 2024  3:03 PM. If you have any questions, ask your nurse or doctor.          STOP taking these medications    ciprofloxacin  500 MG tablet Commonly known as: Cipro  Stopped by: Camilla Cedar Kanen Mottola   meloxicam   15 MG tablet Commonly known as: MOBIC  Stopped by: Camilla Cedar Henderson Fr[place]   metFORMIN 500 MG 24 hr tablet Commonly known as: GLUCOPHAGE-XR Stopped by: Noah Lembke J Navraj Dreibelbis   metroNIDAZOLE  500 MG tablet Commonly known as: FLAGYL  Stopped by: Camilla Cedar Jerzy Roepke   oxyCODONE -acetaminophen  5-325 MG tablet Commonly known as: PERCOCET/ROXICET Stopped by: Camilla Cedar Pricilla Moehle   tacrolimus  0.1 % ointment Commonly known as: PROTOPIC  Stopped by: Darriel Utter J Draxton Luu   triamcinolone  ointment 0.1 % Commonly known as: KENALOG  Stopped by: Camilla Cedar Talyia Allende       TAKE these medications    albuterol  (2.5 MG/3ML) 0.083% nebulizer solution Commonly known as: PROVENTIL  USE 1 VIAL VIA NEBULIZER THREE TIMES DAILY AS NEEDED FOR WHEEZING   atenolol  50 MG tablet Commonly known as: Tenormin  Take 1 tablet (50 mg total) by mouth daily. What changed: when to take this   desvenlafaxine  100 MG 24 hr tablet Commonly known as: PRISTIQ  Take 1 tablet (100 mg total) by mouth daily.   desvenlafaxine  100 MG 24 hr tablet Commonly known as: PRISTIQ  Take 1 tablet by mouth daily.   Desvenlafaxine  ER 100 MG Tb24   fluconazole 150 MG tablet Commonly known as: DIFLUCAN TAKE 1 TABLET BY MOUTH NOW. MAY REPEAT IN 72 HOURS. IF NO IMPROVEMENT   gabapentin  300 MG capsule Commonly known as: Neurontin  Take 1 capsule (300 mg total) by mouth at bedtime.   HYDROcodone -acetaminophen  5-325 MG tablet Commonly known as: NORCO/VICODIN Take 1 tablet by mouth every 6 (six) hours.   levocetirizine 5 MG tablet Commonly known as: XYZAL  Take 1 tablet (5 mg total) by mouth daily.   levothyroxine  50 MCG tablet Commonly known as: Synthroid  Take 0.5  tablets (25 mcg total) by mouth daily before breakfast.   levothyroxine  200 MCG tablet Commonly known as: Synthroid  Take 1 tablet (200 mcg total) by mouth daily.   LORazepam  1 MG tablet Commonly known as: ATIVAN  Take 1 mg by mouth at bedtime.   losartan  100 MG tablet Commonly known as: COZAAR  Take 100 mg by mouth daily.   multivitamin tablet Take 2 tablets by mouth daily. gummy   NON FORMULARY Pt uses a cpap nightly   omeprazole 20 MG capsule Commonly known as: PRILOSEC Take 20 mg by mouth daily.   ondansetron  4 MG disintegrating tablet Commonly known as: ZOFRAN -ODT Take 1 tablet (4 mg total) by mouth every 8 (eight) hours as needed for nausea or vomiting.   oxybutynin  10 MG 24 hr tablet Commonly known as: DITROPAN -XL Take 10 mg by mouth 2 (two) times daily.   rosuvastatin  10 MG tablet Commonly known as: CRESTOR  Take 10 mg by mouth daily.   spironolactone -hydrochlorothiazide  25-25 MG tablet Commonly known as: Aldactazide  Take 1 tablet by mouth every morning.   tiZANidine  4 MG tablet Commonly known as: ZANAFLEX  Take 1 tablet (4 mg total) by mouth every 8 (eight) hours as needed for 10 days.          OBJECTIVE:   PHYSICAL EXAM: VS: BP 126/84 (BP Location: Left Arm, Patient Position: Sitting, Cuff Size: Normal)   Pulse 84   Ht 5' 0.43" (1.535 m)   Wt (!) 353 lb (160.1 kg)   LMP 05/22/2015   SpO2 96%   BMI 67.96 kg/m    EXAM: General: Pt appears well and is in NAD  Neck: General: Supple without adenopathy. Thyroid :   No goiter or nodules appreciated  Lungs: Clear with good BS bilat   Heart: Auscultation: RRR.  Mental Status: Judgment, insight: Intact Orientation:  Oriented to time, place, and person Mood and affect: No depression, anxiety, or agitation     DATA REVIEWED:    Latest Reference Range & Units 03/15/23 10:56  TSH 0.35 - 5.50 uIU/mL 0.03 (L)     Latest Reference Range & Units 02/20/23 10:07  Sodium 135 - 145 mmol/L 139  Potassium  3.5 - 5.1 mmol/L 3.6  Chloride 98 - 111 mmol/L 103  CO2 22 - 32 mmol/L 28  Glucose 70 - 99 mg/dL 130 (H)  BUN 6 - 20 mg/dL 12  Creatinine 8.65 - 7.84 mg/dL 6.96  Calcium  8.9 - 10.3 mg/dL 9.3  Anion gap 5 - 15  8  Alkaline Phosphatase 38 - 126 U/L 62  Albumin 3.5 - 5.0 g/dL 4.0  AST 15 - 41 U/L 27  ALT 0 - 44 U/L 32  Total Protein 6.5 - 8.1 g/dL 7.1  Total Bilirubin 0.3 - 1.2 mg/dL 0.7  GFR, Estimated >29 mL/min >60  (H): Data is abnormally high   Thyroid  Pathology 07/01/2020   Clinical History: Thyroid  neoplasm of uncertain behavior, multiple  thyroid  nodules (crm)      FINAL MICROSCOPIC DIAGNOSIS:   A. THYROID , TOTAL THYROIDECTOMY:  - Papillary thyroid  carcinoma, classical variant, 1 cm, involving  inferior pole of the right lobe  - Papillary thyroid  carcinoma, classical variant, involving left lobe  - Carcinoma is confined to thyroid  gland without extrathyroidal  extension  - Resection margins are negative for carcinoma  - Negative for lymphovascular invasion  - Benign hyperplastic nodule, 2.5 cm, of the right lobe  - See oncology table   B. 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 Current dimensions 1.4 cm x 1.6 cm x 1.7 cm, previously 1.3 cm x 1.2 cm 1.5 cm.   Additional small focal hypodensities medial to the carotid sheath in the surgical thyroid  bed, 6 mm and unchanged from the prior.   IMPRESSION: Total thyroidectomy, with redemonstration of soft tissue nodule lateral to the left carotid sheath. This remains suspicious for residual/recurrence, particularly given slight interval enlargement to a maximum dimension of 1.7 cm. Either a repeat biopsy or  further evaluation with nuclear medicine testing may be useful.     FNA left nodule 01/18/2022   FINAL MICROSCOPIC DIAGNOSIS:  - Consistent with benign follicular nodule (Bethesda category II)   FNA left nodule 08/03/2022  Clinical History: 1.7 cm LLL, left thyroid  bed nodule; Hx thyroidectomy 2021, papillary thyroid  cancer Specimen Submitted:  A. THYROID , LLL, FINE NEEDLE ASPIRATION:   FINAL MICROSCOPIC DIAGNOSIS: - No malignant cells identified - See comment  Comment: While no malignant cells are identified, evaluation is limited by a paucicellular specimen consisting predominantly of histiocytes and chronic inflammatory cells.    WBS 09/27/2022  FINDINGS: No focal activity in thyroid  bed. No abnormal uptake on whole-body I 131 scan. Physiologic uptake noted in the GI and GU tract.   IMPRESSION: No evidence of local thyroid  cancer recurrence or distant metastatic disease.  CT neck 11/06/2022  Status post thyroidectomy without abnormal soft tissue density in the thyroidectomy bed. 2. 1.2 cm x 1.5 cm partially calcified left level IV node suspicious for nodal metastatic disease. A 0.8 cm short axis left level II node is indeterminate.  No other pathologic lymphadenopathy in the imaged neck.   Pathology report 02/28/2023   FINAL MICROSCOPIC DIAGNOSIS:  A. LYMPH NODE, LEFT SELECTED LEVELS 2, 3 AND 4, NECK DISSECTION:      One of twenty-six lymph nodes (level 4), positive for metastatic papillary thyroid  carcinoma (1/26).      Metastatic focus: 12 mm in maximal dimension.      No extranodal extension identified.   Old records , labs and images have been reviewed.    ASSESSMENT / PLAN / RECOMMENDATIONS:   PTC with recurrence:   -S/p total thyroidectomy with 1 cm papillary thyroid  carcinoma on the left, 2/2 lymph node positive for malignancy (06/2020)  - S/P RAI 98.4 mCi I-131 sodium iodide on 08/20/2020 post treatment WBS was showed tracer activity at the  left thyroid  bed  -Thyroid  bed ultrasound revealed 1.5 cm left neck nodule (12/2021), s/p benign FNA May/2023.  Due to interval enlargement we attempted another FNA 08/03/2022 but despite no evidence of malignant cells the sample was considered of limited evaluation - WBS 09/2022 did not show any evidence of local thyroid  recurrence -CT neck 10/2022 revealed 1.5 cm partially calcified left suspicious nodule. -S/p left neck dissection with 1 out of 26 lymph nodes was positive for metastatic PTC -TSH goal 0.1-0.5 uIU/mL  -TG has trended down following her surgery -TG, TG AB pending today -Will proceed with thyroid  bed ultrasound  2. Postoperative hypothyroidism   -TSH below goal, will decrease levothyroxine  as below  Medications  Continue levothyroxine  200 mcg daily PLUS  Decrease levothyroxine  25 mcg daily     Follow-up in 6 months     Signed electronically by: Natale Bail, MD  [facility] Endocrinology  [facility] [address] [place], [place] [address] Phone: [phone] FAX: [phone]      CC: Omie Bickers, MD [address] [place] [address] Phone: [phone]  Fax: [phone]   Return to Endocrinology clinic as below: Future Appointments  Date Time Provider Department Center  01/28/2024  3:00 PM Lea Primmer, LCSW [facility] None  03/24/2024  3:45 PM Rochester Chuck, MD [facility] None  05/05/2024 11:45 AM Cameron Cea, MD [facility] None

## 2024-01-24 ENCOUNTER — Encounter: Payer: Self-pay | Admitting: Internal Medicine

## 2024-01-24 LAB — THYROGLOBULIN ANTIBODY: Thyroglobulin Ab: <1 [IU]/mL (ref <=1)

## 2024-01-24 LAB — THYROGLOBULIN LEVEL: Thyroglobulin: <0.1 ng/mL — ABNORMAL LOW

## 2024-01-24 LAB — TSH: TSH: 1.41 m[IU]/L

## 2024-01-24 MED ORDER — LEVOTHYROXINE SODIUM 200 MCG PO TABS: 200.0000 ug | ORAL_TABLET | Freq: Every day | ORAL | 3 refills | Status: DC

## 2024-01-24 MED ORDER — LEVOTHYROXINE SODIUM 50 MCG PO TABS: 50.0000 ug | ORAL_TABLET | Freq: Every day | ORAL | 3 refills | Status: AC

## 2024-01-25 ENCOUNTER — Encounter: Payer: Self-pay | Admitting: Internal Medicine

## 2024-01-28 ENCOUNTER — Ambulatory Visit (HOSPITAL_COMMUNITY): Admitting: Clinical

## 2024-01-29 ENCOUNTER — Ambulatory Visit
Admission: RE | Admit: 2024-01-29 | Discharge: 2024-01-29 | Discharge status: Home / Self Care | Source: Ambulatory Visit | Attending: Internal Medicine | Admitting: Internal Medicine

## 2024-01-29 DIAGNOSIS — C73 Malignant neoplasm of thyroid gland: Secondary | ICD-10-CM

## 2024-01-31 ENCOUNTER — Encounter: Payer: Self-pay | Admitting: Internal Medicine

## 2024-02-01 ENCOUNTER — Other Ambulatory Visit (HOSPITAL_COMMUNITY): Payer: Self-pay

## 2024-02-16 ENCOUNTER — Encounter: Payer: Self-pay | Admitting: Internal Medicine

## 2024-03-03 ENCOUNTER — Other Ambulatory Visit (HOSPITAL_COMMUNITY): Payer: Self-pay

## 2024-03-17 ENCOUNTER — Ambulatory Visit (HOSPITAL_COMMUNITY): Admitting: Clinical

## 2024-03-24 ENCOUNTER — Ambulatory Visit (INDEPENDENT_AMBULATORY_CARE_PROVIDER_SITE_OTHER): Admitting: Allergy & Immunology

## 2024-03-24 ENCOUNTER — Telehealth: Payer: Self-pay

## 2024-03-24 ENCOUNTER — Encounter: Payer: Self-pay | Admitting: Allergy & Immunology

## 2024-03-24 VITALS — BP 132/82 | HR 77 | Temp 98.2°F | Wt 346.1 lb

## 2024-03-24 DIAGNOSIS — J3089 Other allergic rhinitis: Secondary | ICD-10-CM | POA: Diagnosis not present

## 2024-03-24 DIAGNOSIS — L299 Pruritus, unspecified: Secondary | ICD-10-CM

## 2024-03-24 DIAGNOSIS — R062 Wheezing: Secondary | ICD-10-CM | POA: Diagnosis not present

## 2024-03-24 DIAGNOSIS — J302 Other seasonal allergic rhinitis: Secondary | ICD-10-CM

## 2024-03-24 DIAGNOSIS — K219 Gastro-esophageal reflux disease without esophagitis: Secondary | ICD-10-CM

## 2024-03-24 MED ORDER — HYDROXYZINE HCL 50 MG PO TABS: 50.0000 mg | ORAL_TABLET | Freq: Every evening | ORAL | 1 refills | Status: AC | PRN

## 2024-03-24 NOTE — Telephone Encounter (Signed)
 Thanks, Juline Patch!   Malachi Bonds, MD [facility]

## 2024-03-24 NOTE — Telephone Encounter (Signed)
 Per Dr. Iva 2 Voquezna  10mg  samples have been signed out to be taken to [facility]  03/26/24 fo patient pickup. Samples have been given to admin to place in the sample cabinet on the shot room side.

## 2024-03-24 NOTE — Progress Notes (Unsigned)
 FOLLOW UP  Date of Service/Encounter:  03/24/24   Assessment:   Seasonal and perennial allergic rhinitis (grasses, ragweed, weeds, indoor molds, dust mites and cat) - on allergen immunotherapy with maintenance reached December 2020, stopped March 2024   Itching - minimal improvement with allergen immunotherapy (stopped in March 2024)    Dysphagia - with a history of a benign esophageal mass in 2008   OSA - has CPAP in place, but she does not use it   History of thyroid  papillary carcinoma   Plan/Recommendations:   Patient Instructions  1. Seasonal and perennial allergic rhinitis (grasses, ragweed, weeds, indoor molds, dust mites and cat) - with marked itching - Continue with the Xyzal  for now. - Stop the Benadryl and start hydroxyzine  50mg  at night (this can with itching and anxiety and allergic rhinitis). - Consider Nemluvio for control of the itching.  - Start triamcinolone  0.1% ointment twice daily as needed (DO NOT use on the face). - Start tacrolimus  0.1% ointment twice daily as needed (OK to use on the face).  2. Wheeze - Testing looks excellent today (better than previous findings).  - With the lack of improvement with the albuterol , I am not convinced that this is albuterol .  - TRY using your CPAP again!   3. Gastroesophageal reflux disease - Let's try changing to Voquenza (pick up on Wednesday from the [place] office after work).  - Continue with the omeprazole twice daily and the famotidine  twice daily. - We can remove medications as others start working.   4. Return in about 3 months (around 06/24/2024). You can have the follow up appointment with Dr. Iva or a Nurse Practicioner (our Nurse Practitioners are excellent and always have Physician oversight!).    Subjective:   Christine Mason is a 53 y.o. female presenting today for follow up of  Chief Complaint  Patient presents with  . Follow-up    Sometimes wheezing     Christine Mason has a  history of the following: Patient Active Problem List   Diagnosis Date Noted  . Thyroid  cancer (HCC) 02/28/2023  . Multiple thyroid  nodules 08/24/2022  . Hepatic vein stenosis 08/24/2022  . Depression 07/11/2022  . Other fatigue 06/27/2022  . SOBOE (shortness of breath on exertion) 06/27/2022  . Osteoarthritis of both knees 06/27/2022  . Pre-diabetes 06/27/2022  . Other hyperlipidemia 06/27/2022  . Depression screening 06/27/2022  . Seasonal and perennial allergic rhinitis 03/08/2022  . Wheeze 03/08/2022  . Gastroesophageal reflux disease 03/08/2022  . Hx of papillary thyroid  carcinoma 04/14/2021  . Status post total thyroidectomy 04/14/2021  . Unilateral primary osteoarthritis, left knee 03/24/2021  . Chronic pain of left knee 03/24/2021  . Morbid obesity (HCC) 02/09/2021  . Abnormal liver function tests 02/09/2021  . Chest pain 02/09/2021  . Chronic low back pain 02/09/2021  . Generalized hyperhidrosis 02/09/2021  . Major depressive disorder 02/09/2021  . Palpitations 02/09/2021  . Urinary incontinence 02/09/2021  . Impaired fasting glucose 02/01/2021  . Essential hypertension 02/01/2021  . Mixed hyperlipidemia 02/01/2021  . Vitamin D  deficiency 02/01/2021  . Hypothyroidism 01/12/2021  . Hypokalemia 01/12/2021  . Papillary thyroid  carcinoma (HCC) 07/14/2020  . Neoplasm of uncertain behavior of thyroid  gland 06/29/2020  . Oropharyngeal dysphagia 05/06/2019  . Dysphagia, pharyngoesophageal phase 05/05/2019  . Vasomotor symptoms due to menopause 03/13/2019  . Genetic testing 06/03/2015  . 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  Discussed  the use of AI scribe software for clinical note transcription with the patient and/or guardian, who gave verbal consent to proceed.  Christine Mason is a 53 y.o. female presenting for {Blank single:19197::a food  challenge,a drug challenge,skin testing,a sick visit,an evaluation of ***,a follow up visit}.  She was last seen in April 2025.  At that time, we stopped the allergy  shots.  We continue with Xyzal  for now we talked about adding Newmluvio for control of itching.  We also started triamcinolone  0.1% ointment twice daily as needed and tacrolimus  0.1% ointment twice daily as needed.  For her wheezing, we felt a lot of this might be related to uncontrolled reflux.  Like testing was a bit lower, but there was no improvement with albuterol .  For her GERD, we continue with omeprazole but increase this to twice daily.  We also added on Pepcid  40 mg twice daily as well.  Since last visit,  Asthma/Respiratory Symptom History: ***  Allergic Rhinitis Symptom History: ***  Food Allergy  Symptom History: ***  Skin Symptom History: ***  GERD Symptom History: ***  Infection Symptom History: ***     She has a history of thyroid  cancer, having undergone a neck dissection in June 2023 due to papillary carcinoma in a lymph node. She had a thyroidectomy in 2021 and has not had a recurrence since her surgery.    Otherwise, there have been no changes to her past medical history, surgical history, family history, or social history.    Review of systems otherwise negative other than that mentioned in the HPI.    Objective:   Blood pressure 132/82, pulse 77, temperature 98.2 F (36.8 C), weight (!) 346 lb 2 oz (157 kg), last menstrual period 05/22/2015, SpO2 96%. Body mass index is 66.64 kg/m.    Physical Exam   Diagnostic studies:    Spirometry: results normal (FEV1: 1.76/76%, FVC: 2.00/69%, FEV1/FVC: 88%).    Spirometry consistent with normal pattern. {Blank single:19197::Albuterol /Atrovent nebulizer,Xopenex/Atrovent nebulizer,Albuterol  nebulizer,Albuterol  four puffs via MDI,Xopenex four puffs via MDI} treatment given in clinic with {Blank single:19197::significant  improvement in FEV1 per ATS criteria,significant improvement in FVC per ATS criteria,significant improvement in FEV1 and FVC per ATS criteria,improvement in FEV1, but not significant per ATS criteria,improvement in FVC, but not significant per ATS criteria,improvement in FEV1 and FVC, but not significant per ATS criteria,no improvement}.  Allergy  Studies: {Blank single:19197::none,deferred due to recent antihistamine use,deferred due to insurance stipulations that require a separate visit for testing,labs sent instead, }    {Blank single:19197::Allergy  testing results were read and interpreted by myself, documented by clinical staff., }      Marty Shaggy, MD  [facility] 

## 2024-03-24 NOTE — Patient Instructions (Addendum)
 1. Seasonal and perennial allergic rhinitis (grasses, ragweed, weeds, indoor molds, dust mites and cat) - with marked itching - Continue with the Xyzal  for now. - Stop the Benadryl and start hydroxyzine  50mg  at night (this can with itching and anxiety and allergic rhinitis). - Consider Nemluvio for control of the itching.  - Start triamcinolone  0.1% ointment twice daily as needed (DO NOT use on the face). - Start tacrolimus  0.1% ointment twice daily as needed (OK to use on the face).  2. Wheeze - Testing looks excellent today (better than previous findings).  - With the lack of improvement with the albuterol , I am not convinced that this is albuterol .  - TRY using your CPAP again!   3. Gastroesophageal reflux disease - Let's try changing to Voquenza (pick up on Wednesday from the [place] office after work).  - Continue with the omeprazole twice daily and the famotidine  twice daily. - We can remove medications as others start working.   4. Return in about 3 months (around 06/24/2024). You can have the follow up appointment with Dr. Iva or a Nurse Practicioner (our Nurse Practitioners are excellent and always have Physician oversight!).    Please inform us  of any Emergency Department visits, hospitalizations, or changes in symptoms. Call us  before going to the ED for breathing or allergy  symptoms since we might be able to fit you in for a sick visit. Feel free to contact us  anytime with any questions, problems, or concerns.  It was a pleasure to see you again today!  Websites that have reliable patient information: 1. American Academy of Asthma, Allergy , and Immunology: www.aaaai.org 2. Food Allergy  Research and Education (FARE): foodallergy.org 3. Mothers of Asthmatics: http://www.asthmacommunitynetwork.org 4. American College of Allergy , Asthma, and Immunology: www.acaai.org      "Like" us  on Facebook and Instagram for our latest updates!      A healthy democracy works  best when [employer] participate! Make sure you are registered to vote! If you have moved or changed any of your contact information, you will need to get this updated before voting! Scan the QR codes below to learn more!

## 2024-03-25 ENCOUNTER — Encounter: Payer: Self-pay | Admitting: Allergy & Immunology

## 2024-03-26 MED ORDER — VOQUEZNA 10 MG PO TABS
1.0000 | ORAL_TABLET | Freq: Every morning | ORAL | 1 refills | Status: DC
Start: 2024-03-26 — End: 2024-05-15

## 2024-03-26 NOTE — Addendum Note (Signed)
 Addended by: FRANCIS ROULEAU A on: 03/26/2024 04:24 PM   Modules accepted: Orders

## 2024-03-26 NOTE — Telephone Encounter (Signed)
 I called the patient and left a message to callback to inform that samples are available for pick up at the [facility] office.

## 2024-04-01 ENCOUNTER — Other Ambulatory Visit (HOSPITAL_COMMUNITY): Payer: Self-pay

## 2024-04-04 ENCOUNTER — Telehealth: Payer: Self-pay

## 2024-04-04 NOTE — Telephone Encounter (Signed)
*  AA  Pharmacy Patient Advocate Encounter   Received notification from CoverMyMeds that prior authorization for Voquezna  10MG  tablets  is required/requested.   Insurance verification completed.   The patient is insured through [facility] .   Per test claim: PA required; PA submitted to above mentioned insurance via CoverMyMeds Key/confirmation #/EOC [id number] Status is pending

## 2024-04-07 MED ORDER — TRIAMCINOLONE ACETONIDE 0.1 % EX OINT: 1.0000 | TOPICAL_OINTMENT | Freq: Two times a day (BID) | CUTANEOUS | 1 refills | Status: AC

## 2024-04-07 MED ORDER — TACROLIMUS 0.1 % EX OINT: TOPICAL_OINTMENT | Freq: Two times a day (BID) | CUTANEOUS | 1 refills | Status: AC

## 2024-04-07 NOTE — Telephone Encounter (Signed)
 Please advise on what else to send in as Voquenza  is not covered by patients insurance.

## 2024-04-07 NOTE — Telephone Encounter (Signed)
 Patient called and expressed that she was notified that the Voquenza was denied. Patient stated that she will continue the OTC reflux medications and contact gher insurance to see about them reconsidering the PA or see what they will cover.

## 2024-04-07 NOTE — Addendum Note (Signed)
 Addended by: FRANCIS ROULEAU A on: 04/07/2024 02:24 PM   Modules accepted: Orders

## 2024-04-07 NOTE — Telephone Encounter (Signed)
 Pharmacy Patient Advocate Encounter  Received notification from [facility] that Prior Authorization for Voquezna  10MG  tablets  has been DENIED.  Full denial letter will be uploaded to the media tab. See denial reason below.

## 2024-04-15 NOTE — Telephone Encounter (Signed)
 She has been on everything else. Doesn't the pharmacy cover it is insurance denies? Can we check that?

## 2024-04-22 NOTE — Telephone Encounter (Signed)
 I guess she can restart what she was on in the past. What a disappointment.

## 2024-04-25 NOTE — Telephone Encounter (Signed)
 I guess the denial did not matter then. Thanks for the update, Diandra!

## 2024-04-25 NOTE — Telephone Encounter (Signed)
 Patient has been notified to continue with the omeprazole twice daily and the famotidine  twice daily. Per the treatment plan from her prior appointment.    - Patient did complete the samples of the Voquenza and did not notice a difference while taking it.

## 2024-04-28 ENCOUNTER — Other Ambulatory Visit (HOSPITAL_COMMUNITY): Payer: Self-pay

## 2024-05-05 ENCOUNTER — Ambulatory Visit: Payer: 59 | Admitting: Hematology and Oncology

## 2024-05-15 ENCOUNTER — Inpatient Hospital Stay: Payer: Self-pay | Attending: Hematology and Oncology | Admitting: Hematology and Oncology

## 2024-05-15 VITALS — BP 155/87 | HR 85 | Temp 98.0°F | Resp 16 | Wt 346.9 lb

## 2024-05-15 DIAGNOSIS — Z803 Family history of malignant neoplasm of breast: Secondary | ICD-10-CM | POA: Insufficient documentation

## 2024-05-15 DIAGNOSIS — Z1509 Genetic susceptibility to other malignant neoplasm: Secondary | ICD-10-CM | POA: Insufficient documentation

## 2024-05-15 DIAGNOSIS — Z79899 Other long term (current) drug therapy: Secondary | ICD-10-CM | POA: Insufficient documentation

## 2024-05-15 DIAGNOSIS — Z9013 Acquired absence of bilateral breasts and nipples: Secondary | ICD-10-CM | POA: Diagnosis not present

## 2024-05-15 DIAGNOSIS — Z85850 Personal history of malignant neoplasm of thyroid: Secondary | ICD-10-CM | POA: Insufficient documentation

## 2024-05-15 DIAGNOSIS — Z1501 Genetic susceptibility to malignant neoplasm of breast: Secondary | ICD-10-CM | POA: Diagnosis not present

## 2024-05-15 NOTE — Assessment & Plan Note (Signed)
 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  Lifetime risk of breast cancer in vary from 28% to 38%. Higher with family history of breast cancer.    Breast Cancer Surveillance: 1. Breast exam 05/15/2024: Normal 2. Mammogram 06/05/2023: Benign breast density category A 3.  Breast MRI 12/12/2022: Irregular enhancing mass left breast 1 cm, biopsy: Benign.   Breast cancer risk reduction: She wants to consider doing bilateral mastectomies.  I sent a referral to Dr. Ebbie and Dr. Arelia..   Total thyroidectomy 07/01/2020: Papillary thyroid  carcinoma, classical variant, 1 cm, involving the lower pole of right lobe and left lobe without extrathyroidal extension, margins negative, negative for LVI, metastatic carcinoma in 2/2 lymph nodes focal extranodal extension is present Status post radioactive iodine therapy 01/18/2022 June 2024: Left neck dissection: 1/26 lymph nodes was positive for thyroid  cancer   I would like to see the patient after she undergoes bilateral mastectomies.   Return to clinic in 1 year for follow-up

## 2024-05-15 NOTE — Progress Notes (Signed)
 Patient Care Team: Shona Norleen PEDLAR, MD as PCP - General (Internal Medicine)  DIAGNOSIS:  Encounter Diagnosis  Name Primary?   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  She has a significant family history of breast cancer, with her mother and sister affected. Previous breast imaging raised concerns in both breasts, but the issue in the left breast resolved. She is currently concerned about a cyst in the right breast, which is difficult to assess due to her body size. She is due for a mammogram in September and an MRI later in the year, having missed an MRI in March 2025.  She is considering a double mastectomy due to physical discomfort from her breast size, estimating each breast weighs about fifteen pounds. She is coordinating with her insurance and planning for time off work for the surgery.     ALLERGIES:  is allergic to penicillins.  MEDICATIONS:  Current Outpatient Medications  Medication Sig Dispense Refill   albuterol  (PROVENTIL ) (2.5 MG/3ML) 0.083% nebulizer solution USE 1 VIAL VIA NEBULIZER THREE TIMES DAILY AS NEEDED FOR WHEEZING     atenolol  (TENORMIN ) 50 MG tablet Take 1 tablet (50 mg total) by mouth daily.     desvenlafaxine  (PRISTIQ ) 100 MG 24 hr tablet Take 1 tablet (100 mg total) by mouth daily.     Desvenlafaxine  ER 100 MG TB24      gabapentin  (NEURONTIN ) 300 MG capsule Take 1 capsule (300 mg total) by mouth at bedtime. 90 capsule 3   HYDROcodone -acetaminophen  (NORCO/VICODIN) 5-325 MG tablet Take 1 tablet by mouth every 6 (six) hours.     hydrOXYzine  (ATARAX ) 50 MG tablet Take 1 tablet (50 mg total) by mouth at bedtime as needed. 90 tablet 1   levocetirizine (XYZAL ) 5 MG tablet Take 1 tablet (5 mg total) by mouth daily. 30 tablet 0    levothyroxine  (SYNTHROID ) 200 MCG tablet Take 1 tablet (200 mcg total) by mouth daily. 90 tablet 3   levothyroxine  (SYNTHROID ) 50 MCG tablet Take 1 tablet (50 mcg total) by mouth daily before breakfast. 90 tablet 3   losartan  (COZAAR ) 100 MG tablet Take 100 mg by mouth daily.      Multiple Vitamin (MULTIVITAMIN) tablet Take 2 tablets by mouth daily. gummy     NON FORMULARY Pt uses a cpap nightly     omeprazole (PRILOSEC) 20 MG capsule Take 20 mg by mouth daily.     ondansetron  (ZOFRAN -ODT) 4 MG disintegrating tablet Take 1 tablet (4 mg total) by mouth every 8 (eight) hours as needed for nausea or vomiting. 10 tablet 0   oxybutynin  (DITROPAN -XL) 10 MG 24 hr tablet Take 10 mg by mouth 2 (two) times daily.     rosuvastatin  (CRESTOR ) 10 MG tablet Take 10 mg by mouth daily.     spironolactone -hydrochlorothiazide  (ALDACTAZIDE ) 25-25 MG tablet Take 1 tablet by mouth every morning. 30 tablet 11   tacrolimus  (PROTOPIC ) 0.1 % ointment Apply topically 2 (two) times daily. Safe to use on the face 300 g 1   tiZANidine  (ZANAFLEX ) 4 MG tablet Take 1 tablet (4 mg total) by mouth every 8 (eight) hours as needed for 10 days. 30 tablet 0   topiramate (TOPAMAX) 25 MG tablet Take 25 mg by mouth daily.     desvenlafaxine  (PRISTIQ ) 100  MG 24 hr tablet Take 1 tablet by mouth daily.     triamcinolone  ointment (KENALOG ) 0.1 % Apply 1 Application topically 2 (two) times daily. Do not use on face 454 g 1   No current facility-administered medications for this visit.    PHYSICAL EXAMINATION: ECOG PERFORMANCE STATUS: 1 - Symptomatic but completely ambulatory  Vitals:   05/15/24 1601  BP: (!) 155/87  Pulse: 85  Resp: 16  Temp: 98 F (36.7 C)  SpO2: 95%   Filed Weights   05/15/24 1601  Weight: (!) 346 lb 14.4 oz (157.4 kg)    Physical Exam BREAST: Breasts normal on palpation.  (exam performed in the presence of a chaperone)  LABORATORY DATA:  I have reviewed the data as listed    Latest Ref Rng & Units  12/11/2023    8:12 PM 02/20/2023   10:07 AM 06/27/2022   10:01 AM  CMP  Glucose 70 - 99 mg/dL 899  883  85   BUN 6 - 20 mg/dL 13  12  14    Creatinine 0.44 - 1.00 mg/dL 9.24  9.35  9.22   Sodium 135 - 145 mmol/L 138  139  140   Potassium 3.5 - 5.1 mmol/L 3.8  3.6  4.5   Chloride 98 - 111 mmol/L 99  103  99   CO2 22 - 32 mmol/L 25  28  22    Calcium  8.9 - 10.3 mg/dL 9.7  9.3  9.7   Total Protein 6.5 - 8.1 g/dL 7.9  7.1  7.5   Total Bilirubin 0.0 - 1.2 mg/dL 0.6  0.7  0.4   Alkaline Phos 38 - 126 U/L 59  62  76   AST 15 - 41 U/L 40  27  28   ALT 0 - 44 U/L 51  32  33     Lab Results  Component Value Date   WBC 9.7 12/11/2023   HGB 13.3 12/11/2023   HCT 41.2 12/11/2023   MCV 93.8 12/11/2023   PLT 291 12/11/2023   NEUTROABS 3.5 06/27/2022    ASSESSMENT & PLAN:  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  Lifetime risk of breast cancer in vary from 28% to 38%. Higher with family history of breast cancer.    Breast Cancer Surveillance: 1. Breast exam 05/15/2024: Normal 2. Mammogram 06/05/2023: Benign breast density category A 3.  Breast MRI 12/12/2022: Irregular enhancing mass left breast 1 cm, biopsy: Benign.   Breast cancer risk reduction: She wants to consider doing bilateral mastectomies.  Because of multiple family issues she has decided not to do it previously.  She will let us  know when she is ready.   Total thyroidectomy 07/01/2020: Papillary thyroid  carcinoma, classical variant, 1 cm, involving the lower pole of right lobe and left lobe without extrathyroidal extension, margins negative, negative for LVI, metastatic carcinoma in 2/2 lymph nodes focal extranodal extension is present Status post radioactive iodine therapy 01/18/2022 June 2024: Left neck dissection: 1/26 lymph nodes was  positive for thyroid  cancer   I would like to see the patient after she undergoes bilateral mastectomies.   I ordered a mammogram at [facility] in September and a breast MRI in November. She will let us  know when she is ready to undergo bilateral mastectomies. Return to clinic in 1 year  for follow-up ------------------------------------- Assessment and Plan Assessment & Plan Genetic susceptibility to breast cancer due to CHEK2 mutation Genetic predisposition to breast cancer due to CHEK2 mutation. Right breast cyst under surveillance. Considering risk-reducing double mastectomy. - Order mammogram for September. - Order MRI for November. - Discuss potential double mastectomy with surgeons and plastic surgeons at [facility] when she is ready.  History of thyroid  cancer, post-thyroidectomy Post-thyroidectomy for papillary thyroid  carcinoma, classical variant. Levothyroxine  therapy not optimized, causing weight gain. Weight loss medications contraindicated due to thyroid  cancer history and family history of medullary thyroid  cancer.      No orders of the defined types were placed in this encounter.  The patient has a good understanding of the overall plan. she agrees with it. she will call with any problems that may develop before the next visit here. Total time spent: 30 mins including face to face time and time spent for planning, charting and co-ordination of care   Naomi MARLA Chad, MD 05/15/24

## 2024-05-25 ENCOUNTER — Other Ambulatory Visit: Payer: Self-pay | Admitting: Cardiology

## 2024-05-25 DIAGNOSIS — I10 Essential (primary) hypertension: Secondary | ICD-10-CM

## 2024-05-26 ENCOUNTER — Other Ambulatory Visit (HOSPITAL_COMMUNITY): Payer: Self-pay

## 2024-05-26 ENCOUNTER — Other Ambulatory Visit: Payer: Self-pay

## 2024-05-26 MED ORDER — SPIRONOLACTONE-HCTZ 25-25 MG PO TABS
1.0000 | ORAL_TABLET | ORAL | 0 refills | Status: DC
  Filled 2024-05-26: qty 30, 30d supply, fill #0

## 2024-06-03 ENCOUNTER — Encounter: Payer: Self-pay | Admitting: Internal Medicine

## 2024-06-09 ENCOUNTER — Encounter (HOSPITAL_COMMUNITY): Payer: Self-pay

## 2024-06-09 ENCOUNTER — Ambulatory Visit (HOSPITAL_COMMUNITY)
Admission: RE | Admit: 2024-06-09 | Discharge: 2024-06-09 | Disposition: A | Discharge status: Home / Self Care | Source: Ambulatory Visit | Attending: Hematology and Oncology | Admitting: Hematology and Oncology

## 2024-06-09 DIAGNOSIS — Z1231 Encounter for screening mammogram for malignant neoplasm of breast: Secondary | ICD-10-CM | POA: Diagnosis not present

## 2024-06-09 DIAGNOSIS — Z1501 Genetic susceptibility to malignant neoplasm of breast: Secondary | ICD-10-CM | POA: Diagnosis present

## 2024-06-24 ENCOUNTER — Other Ambulatory Visit: Payer: Self-pay | Admitting: Cardiology

## 2024-06-24 ENCOUNTER — Other Ambulatory Visit: Payer: Self-pay

## 2024-06-24 ENCOUNTER — Other Ambulatory Visit (HOSPITAL_COMMUNITY): Payer: Self-pay

## 2024-06-24 DIAGNOSIS — I10 Essential (primary) hypertension: Secondary | ICD-10-CM

## 2024-06-24 MED ORDER — SPIRONOLACTONE-HCTZ 25-25 MG PO TABS
1.0000 | ORAL_TABLET | ORAL | 0 refills | Status: DC
  Filled 2024-06-24 – 2024-07-03 (×3): qty 15, 15d supply, fill #0

## 2024-06-25 ENCOUNTER — Other Ambulatory Visit: Payer: Self-pay

## 2024-06-25 ENCOUNTER — Encounter: Payer: Self-pay | Admitting: Pharmacist

## 2024-06-25 ENCOUNTER — Other Ambulatory Visit (HOSPITAL_COMMUNITY): Payer: Self-pay

## 2024-06-26 ENCOUNTER — Other Ambulatory Visit: Payer: Self-pay | Admitting: Internal Medicine

## 2024-06-28 ENCOUNTER — Other Ambulatory Visit (HOSPITAL_COMMUNITY): Payer: Self-pay

## 2024-06-29 ENCOUNTER — Encounter: Payer: Self-pay | Admitting: Internal Medicine

## 2024-06-30 ENCOUNTER — Other Ambulatory Visit: Payer: Self-pay

## 2024-06-30 MED ORDER — LEVOTHYROXINE SODIUM 200 MCG PO TABS: 200.0000 ug | ORAL_TABLET | Freq: Every day | ORAL | 1 refills | Status: AC

## 2024-07-03 ENCOUNTER — Other Ambulatory Visit (HOSPITAL_COMMUNITY): Payer: Self-pay

## 2024-07-04 ENCOUNTER — Other Ambulatory Visit: Payer: Self-pay

## 2024-07-18 ENCOUNTER — Ambulatory Visit: Admitting: Family Medicine

## 2024-07-18 NOTE — Patient Instructions (Incomplete)
 Wheezing Continue albuterol  once every 4 hours if needed for cough or wheeze  Allergic rhinitis Continue allergen avoidance measures directed toward grass pollen, ragweed pollen, weed pollen, indoor mold, dust mite, and cat as listed below Continue Xyzal  5 mg once a day if needed for runny nose or itch Consider saline nasal rinses as needed for nasal symptoms. Use this before any medicated nasal sprays for best result  Pruritus Continue a twice a day moisturizing routine Continue tacrolimus  twice a day if needed for red or itchy areas Continue triamcinolone  to red and itchy areas underneath your face up to twice a day.  Do not use this medication longer than 2 weeks in a row Consider Nemluvio for control of itch  Dysphagia  OSA Follow-up with your pulmonary specialist team as recommended  Reflux Continue dietary lifestyle modifications as listed below Continue Voquezna , omeprazole, and famotidine  as previously prescribed  Call the clinic if this treatment plan is not working well for you.  Follow up in *** or sooner if needed.  Reducing Pollen Exposure The American Academy of Allergy , Asthma and Immunology suggests the following steps to reduce your exposure to pollen during allergy  seasons. Do not hang sheets or clothing out to dry; pollen may collect on these items. Do not mow lawns or spend time around freshly cut grass; mowing stirs up pollen. Keep windows closed at night.  Keep car windows closed while driving. Minimize morning activities outdoors, a time when pollen counts are usually at their highest. Stay indoors as much as possible when pollen counts or humidity is high and on windy days when pollen tends to remain in the air longer. Use air conditioning when possible.  Many air conditioners have filters that trap the pollen spores. Use a HEPA room air filter to remove pollen form the indoor air you breathe.  Control of Mold Allergen Mold and fungi can grow on a variety  of surfaces provided certain temperature and moisture conditions exist.  Outdoor molds grow on plants, decaying vegetation and soil.  The major outdoor mold, Alternaria and Cladosporium, are found in very high numbers during hot and dry conditions.  Generally, a late Summer - Fall peak is seen for common outdoor fungal spores.  Rain will temporarily lower outdoor mold spore count, but counts rise rapidly when the rainy period ends.  The most important indoor molds are Aspergillus and Penicillium.  Dark, humid and poorly ventilated basements are ideal sites for mold growth.  The next most common sites of mold growth are the bathroom and the kitchen.  Outdoor [employer] Use air conditioning and keep windows closed Avoid exposure to decaying vegetation. Avoid leaf raking. Avoid grain handling. Consider wearing a face mask if working in moldy areas.  Indoor Mold Control Maintain humidity below 50%. Clean washable surfaces with 5% bleach solution. Remove sources e.g. Contaminated carpets.  Control of Dog or Cat Allergen Avoidance is the best way to manage a dog or cat allergy . If you have a dog or cat and are allergic to dog or cats, consider removing the dog or cat from the home. If you have a dog or cat but don't want to find it a new home, or if your family wants a pet even though someone in the household is allergic, here are some strategies that may help keep symptoms at bay:  Keep the pet out of your bedroom and restrict it to only a few rooms. Be advised that keeping the dog or cat in only  one room will not limit the allergens to that room. Don't pet, hug or kiss the dog or cat; if you do, wash your hands with soap and water . High-efficiency particulate air (HEPA) cleaners run continuously in a bedroom or living room can reduce allergen levels over time. Regular use of a high-efficiency vacuum cleaner or a central vacuum can reduce allergen levels. Giving your dog or cat a bath at least  once a week can reduce airborne allergen.   Control of Dust Mite Allergen Dust mites play a major role in allergic asthma and rhinitis. They occur in environments with high humidity wherever human skin is found. Dust mites absorb humidity from the atmosphere (ie, they do not drink) and feed on organic matter (including shed human and animal skin). Dust mites are a microscopic type of insect that you cannot see with the naked eye. High levels of dust mites have been detected from mattresses, pillows, carpets, upholstered furniture, bed covers, clothes, soft toys and any woven material. The principal allergen of the dust mite is found in its feces. A gram of dust may contain 1,000 mites and 250,000 fecal particles. Mite antigen is easily measured in the air during house cleaning activities. Dust mites do not bite and do not cause harm to humans, other than by triggering allergies/asthma.  Ways to decrease your exposure to dust mites in your home:  1. Encase mattresses, box springs and pillows with a mite-impermeable barrier or cover  2. Wash sheets, blankets and drapes weekly in hot water  (130 F) with detergent and dry them in a dryer on the hot setting.  3. Have the room cleaned frequently with a vacuum cleaner and a damp dust-mop. For carpeting or rugs, vacuuming with a vacuum cleaner equipped with a high-efficiency particulate air (HEPA) filter. The dust mite allergic individual should not be in a room which is being cleaned and should wait 1 hour after cleaning before going into the room.  4. Do not sleep on upholstered furniture (eg, couches).  5. If possible removing carpeting, upholstered furniture and drapery from the home is ideal. Horizontal blinds should be eliminated in the rooms where the person spends the most time (bedroom, study, television room). Washable vinyl, roller-type shades are optimal.  6. Remove all non-washable stuffed toys from the bedroom. Wash stuffed toys weekly like  sheets and blankets above.  7. Reduce indoor humidity to less than 50%. Inexpensive humidity monitors can be purchased at most hardware stores. Do not use a humidifier as can make the problem worse and are not recommended.

## 2024-07-18 NOTE — Progress Notes (Deleted)
   [address] [place] [address] Dept: [phone]  FOLLOW UP NOTE  Patient ID: Christine Mason, female    DOB: [birth year]  Age: 53 y.o. MRN: [id number] Date of Office Visit: 07/18/2024  Assessment  Chief Complaint: No chief complaint on file.  HPI Christine Mason is a 53 year old female who presents to the clinic for follow-up visit.  She was last seen in this clinic on 03/24/2024 by Dr. Iva for evaluation of wheeze, allergic rhinitis, pruritus, reflux, dysphagia, obstructive sleep apnea, and history of thyroid  papillary carcinoma.  Her last environmental allergy  testing was on 01/15/2019 and she completed allergen immunotherapy from 2022 March 2024 directed toward grass pollen, ragweed pollen, weed pollen, indoor mold, dust mite, and cat.  Discussed the use of AI scribe software for clinical note transcription with the patient, who gave verbal consent to proceed.  History of Present Illness      Drug Allergies:  Allergies  Allergen Reactions   Penicillins Rash    Has patient had a PCN reaction causing immediate rash, facial/tongue/throat swelling, SOB or lightheadedness with hypotension: {Yes Has patient had a PCN reaction causing severe rash involving mucus membranes or skin necrosis:unknown Has patient had a PCN reaction that required hospitalizationNo Has patient had a PCN reaction occurring within the last 10 years:No If all of the above answers are NO, then may proceed with Cephalosporin use.     Physical Exam: LMP 05/22/2015    Physical Exam  Diagnostics:    Assessment and Plan: No diagnosis found.  No orders of the defined types were placed in this encounter.   There are no Patient Instructions on file for this visit.  No follow-ups on file.    Thank you for the opportunity to care for this patient.  Please do not hesitate to contact me with questions.  Arlean Mutter, FNP [facility]

## 2024-07-24 ENCOUNTER — Other Ambulatory Visit (HOSPITAL_COMMUNITY): Payer: Self-pay

## 2024-07-24 ENCOUNTER — Other Ambulatory Visit: Payer: Self-pay | Admitting: Cardiology

## 2024-07-24 DIAGNOSIS — I10 Essential (primary) hypertension: Secondary | ICD-10-CM

## 2024-07-24 NOTE — Telephone Encounter (Signed)
 Pt has made an appt with Dr Ladona at next available 10/10/2023

## 2024-07-25 ENCOUNTER — Other Ambulatory Visit (HOSPITAL_COMMUNITY): Payer: Self-pay

## 2024-07-25 ENCOUNTER — Other Ambulatory Visit: Payer: Self-pay

## 2024-07-25 MED ORDER — SPIRONOLACTONE-HCTZ 25-25 MG PO TABS
1.0000 | ORAL_TABLET | ORAL | 0 refills | Status: DC
  Filled 2024-07-25 (×2): qty 90, 90d supply, fill #0

## 2024-07-30 ENCOUNTER — Emergency Department (HOSPITAL_COMMUNITY)

## 2024-07-30 ENCOUNTER — Emergency Department (HOSPITAL_COMMUNITY)
Admission: EM | Admit: 2024-07-30 | Discharge: 2024-07-30 | Disposition: A | Discharge status: Home / Self Care | Attending: Emergency Medicine | Admitting: Emergency Medicine

## 2024-07-30 ENCOUNTER — Encounter (HOSPITAL_COMMUNITY): Payer: Self-pay

## 2024-07-30 ENCOUNTER — Other Ambulatory Visit: Payer: Self-pay

## 2024-07-30 DIAGNOSIS — R0789 Other chest pain: Secondary | ICD-10-CM | POA: Insufficient documentation

## 2024-07-30 DIAGNOSIS — I10 Essential (primary) hypertension: Secondary | ICD-10-CM | POA: Diagnosis not present

## 2024-07-30 DIAGNOSIS — Z85850 Personal history of malignant neoplasm of thyroid: Secondary | ICD-10-CM | POA: Insufficient documentation

## 2024-07-30 DIAGNOSIS — Z79899 Other long term (current) drug therapy: Secondary | ICD-10-CM | POA: Diagnosis not present

## 2024-07-30 DIAGNOSIS — E876 Hypokalemia: Secondary | ICD-10-CM | POA: Diagnosis not present

## 2024-07-30 LAB — CBC WITH DIFFERENTIAL/PLATELET
Abs Immature Granulocytes: 0.02 K/uL (ref 0.00–0.07)
Basophils Absolute: 0 K/uL (ref 0.0–0.1)
Basophils Relative: 1 %
Eosinophils Absolute: 0.1 K/uL (ref 0.0–0.5)
Eosinophils Relative: 2 %
HCT: 39.5 % (ref 36.0–46.0)
Hemoglobin: 13.1 g/dL (ref 12.0–15.0)
Immature Granulocytes: 0 %
Lymphocytes Relative: 32 %
Lymphs Abs: 2.2 K/uL (ref 0.7–4.0)
MCH: 30.9 pg (ref 26.0–34.0)
MCHC: 33.2 g/dL (ref 30.0–36.0)
MCV: 93.2 fL (ref 80.0–100.0)
Monocytes Absolute: 0.7 K/uL (ref 0.1–1.0)
Monocytes Relative: 10 %
Neutro Abs: 3.9 K/uL (ref 1.7–7.7)
Neutrophils Relative %: 55 %
Platelets: 312 K/uL (ref 150–400)
RBC: 4.24 MIL/uL (ref 3.87–5.11)
RDW: 13.9 % (ref 11.5–15.5)
WBC: 7 K/uL (ref 4.0–10.5)
nRBC: 0 % (ref 0.0–0.2)

## 2024-07-30 LAB — MAGNESIUM: Magnesium: 2.1 mg/dL (ref 1.7–2.4)

## 2024-07-30 LAB — COMPREHENSIVE METABOLIC PANEL WITH GFR
ALT: 57 U/L — ABNORMAL HIGH (ref 0–44)
AST: 43 U/L — ABNORMAL HIGH (ref 15–41)
Albumin: 4.5 g/dL (ref 3.5–5.0)
Alkaline Phosphatase: 76 U/L (ref 38–126)
Anion gap: 13 (ref 5–15)
BUN: 13 mg/dL (ref 6–20)
CO2: 25 mmol/L (ref 22–32)
Calcium: 9.4 mg/dL (ref 8.9–10.3)
Chloride: 100 mmol/L (ref 98–111)
Creatinine, Ser: 0.65 mg/dL (ref 0.44–1.00)
GFR, Estimated: >60 mL/min (ref >60)
Glucose, Bld: 146 mg/dL — ABNORMAL HIGH (ref 70–99)
Potassium: 3.3 mmol/L — ABNORMAL LOW (ref 3.5–5.1)
Sodium: 138 mmol/L (ref 135–145)
Total Bilirubin: 0.4 mg/dL (ref 0.0–1.2)
Total Protein: 7.4 g/dL (ref 6.5–8.1)

## 2024-07-30 LAB — LIPASE, BLOOD: Lipase: 44 U/L (ref 11–51)

## 2024-07-30 LAB — TROPONIN T, HIGH SENSITIVITY: Troponin T High Sensitivity: <15 ng/L (ref 0–19)

## 2024-07-30 MED ORDER — LIDOCAINE 5 % EX PTCH: 1.0000 | MEDICATED_PATCH | CUTANEOUS | 0 refills | Status: AC

## 2024-07-30 MED ORDER — METHOCARBAMOL 500 MG PO TABS
1000.0000 mg | ORAL_TABLET | Freq: Once | ORAL | Status: AC
  Administered 2024-07-30: 1000 mg via ORAL
  Filled 2024-07-30: qty 2

## 2024-07-30 MED ORDER — IOHEXOL 350 MG/ML SOLN
100.0000 mL | Freq: Once | INTRAVENOUS | Status: AC | PRN
  Administered 2024-07-30: 100 mL via INTRAVENOUS

## 2024-07-30 MED ORDER — METHOCARBAMOL 500 MG PO TABS: 500.0000 mg | ORAL_TABLET | Freq: Three times a day (TID) | ORAL | 0 refills | Status: AC | PRN

## 2024-07-30 MED ORDER — POTASSIUM CHLORIDE CRYS ER 20 MEQ PO TBCR
40.0000 meq | EXTENDED_RELEASE_TABLET | Freq: Once | ORAL | Status: AC
  Administered 2024-07-30: 40 meq via ORAL
  Filled 2024-07-30: qty 2

## 2024-07-30 MED ORDER — KETOROLAC TROMETHAMINE 15 MG/ML IJ SOLN
15.0000 mg | Freq: Once | INTRAMUSCULAR | Status: AC
  Administered 2024-07-30: 15 mg via INTRAVENOUS
  Filled 2024-07-30: qty 1

## 2024-07-30 MED ORDER — HYDROMORPHONE HCL 1 MG/ML IJ SOLN
0.5000 mg | Freq: Once | INTRAMUSCULAR | Status: AC
  Administered 2024-07-30: 0.5 mg via INTRAVENOUS
  Filled 2024-07-30: qty 0.5

## 2024-07-30 NOTE — ED Notes (Signed)
 Patient transported to CT

## 2024-07-30 NOTE — ED Provider Notes (Signed)
 [place] EMERGENCY DEPARTMENT AT [facility] Provider Note   CSN: [id number] Arrival date & time: 07/30/24  9946     Patient presents with: Flank Pain   Christine Mason is a 53 y.o. female.   HPI Patient presents for left lateral chest wall pain.  Medical history includes HLD, depression, HTN, thyroid  cancer, sleep apnea, prediabetes, GERD, chronic pain.  She was recently started on a antibiotic for treatment of UTI.  Shortly prior to arrival, patient was resting in her recliner when she had sudden onset of left lateral chest wall pain.  She describes tightness in her left shoulder as well.  She denies any other current symptoms.  She received 100 mcg of fentanyl  with no relief prior to arrival.    Prior to Admission medications   Medication Sig Start Date End Date Taking? Authorizing Provider  lidocaine  (LIDODERM ) 5 % Place 1 patch onto the skin daily. Remove & Discard patch within 12 hours or as directed by MD 07/30/24  Yes Melvenia Motto, MD  methocarbamol  (ROBAXIN ) 500 MG tablet Take 1 tablet (500 mg total) by mouth every 8 (eight) hours as needed for muscle spasms. 07/30/24  Yes Melvenia Motto, MD  albuterol  (PROVENTIL ) (2.5 MG/3ML) 0.083% nebulizer solution USE 1 VIAL VIA NEBULIZER THREE TIMES DAILY AS NEEDED FOR WHEEZING    [provider]  atenolol  (TENORMIN ) 50 MG tablet Take 1 tablet (50 mg total) by mouth daily. 04/19/18   Gudena, Vinay, MD  desvenlafaxine  (PRISTIQ ) 100 MG 24 hr tablet Take 1 tablet (100 mg total) by mouth daily. 05/02/22   Gudena, Vinay, MD  desvenlafaxine  (PRISTIQ ) 100 MG 24 hr tablet Take 1 tablet by mouth daily. 05/30/23   [provider]  Desvenlafaxine  ER 100 MG TB24     [provider]  gabapentin  (NEURONTIN ) 300 MG capsule Take 1 capsule (300 mg total) by mouth at bedtime. 11/22/23   Margrette Taft BRAVO, MD  HYDROcodone -acetaminophen  (NORCO/VICODIN) 5-325 MG tablet Take 1 tablet by mouth every 6 (six) hours.    [provider]  hydrOXYzine  (ATARAX ) 50 MG tablet Take 1 tablet (50 mg total) by mouth at bedtime as needed. 03/24/24   Iva Marty Saltness, MD  levocetirizine (XYZAL ) 5 MG tablet Take 1 tablet (5 mg total) by mouth daily. 03/03/23   Luciano Standing, MD  levothyroxine  (SYNTHROID ) 200 MCG tablet Take 1 tablet (200 mcg total) by mouth daily. 06/30/24   Shamleffer, Ibtehal Jaralla, MD  levothyroxine  (SYNTHROID ) 50 MCG tablet Take 1 tablet (50 mcg total) by mouth daily before breakfast. 01/24/24   Shamleffer, Ibtehal Jaralla, MD  losartan  (COZAAR ) 100 MG tablet Take 100 mg by mouth daily.  12/09/18   [provider]  Multiple Vitamin (MULTIVITAMIN) tablet Take 2 tablets by mouth daily. gummy    [provider]  NON FORMULARY Pt uses a cpap nightly    [provider]  omeprazole (PRILOSEC) 20 MG capsule Take 20 mg by mouth daily. 11/30/23   [provider]  ondansetron  (ZOFRAN -ODT) 4 MG disintegrating tablet Take 1 tablet (4 mg total) by mouth every 8 (eight) hours as needed for nausea or vomiting. 02/16/23   Rodriguez-Southworth, Kyra, PA-C  oxybutynin  (DITROPAN -XL) 10 MG 24 hr tablet Take 10 mg by mouth 2 (two) times daily.    [provider]  rosuvastatin  (CRESTOR ) 10 MG tablet Take 10 mg by mouth daily. 11/09/20   [provider]  spironolactone -hydrochlorothiazide  (ALDACTAZIDE ) 25-25 MG tablet Take 1 tablet by mouth every  morning. 07/25/24   Ladona Heinz, MD  tacrolimus  (PROTOPIC ) 0.1 % ointment Apply topically 2 (two) times daily. Safe to use on the face 04/07/24   Iva Marty Saltness, MD  topiramate (TOPAMAX) 25 MG tablet Take 25 mg by mouth daily.    [provider]  triamcinolone  ointment (KENALOG ) 0.1 % Apply 1 Application topically 2 (two) times daily. Do not use on face 04/07/24   Iva Marty Saltness, MD  traZODone  (DESYREL ) 50 MG tablet Take 1 tablet (50 mg total) by mouth at bedtime. 09/26/22 09/27/22      Allergies: Penicillins     Review of Systems  Cardiovascular:  Positive for chest pain.  Musculoskeletal:  Positive for arthralgias.  All other systems reviewed and are negative.   Updated Vital Signs BP (!) 105/46   Pulse 62   Temp 98.1 F (36.7 C) (Oral)   Resp 12   Ht 5' 2 (1.575 m)   Wt (!) 156.5 kg   LMP 05/22/2015   SpO2 90%   BMI 63.10 kg/m   Physical Exam Vitals and nursing note reviewed.  Constitutional:      General: She is not in acute distress.    Appearance: Normal appearance. She is well-developed. She is not ill-appearing, toxic-appearing or diaphoretic.  HENT:     Head: Normocephalic and atraumatic.     Right Ear: External ear normal.     Left Ear: External ear normal.     Nose: Nose normal.     Mouth/Throat:     Mouth: Mucous membranes are moist.  Eyes:     Extraocular Movements: Extraocular movements intact.     Conjunctiva/sclera: Conjunctivae normal.  Cardiovascular:     Rate and Rhythm: Normal rate and regular rhythm.  Pulmonary:     Effort: Pulmonary effort is normal. No respiratory distress.     Breath sounds: Normal breath sounds.  Chest:     Chest wall: Tenderness present.  Abdominal:     General: There is no distension.     Palpations: Abdomen is soft.     Tenderness: There is no abdominal tenderness.  Musculoskeletal:        General: No swelling. Normal range of motion.     Cervical back: Normal range of motion and neck supple.  Skin:    General: Skin is warm and dry.     Coloration: Skin is not jaundiced or pale.  Neurological:     General: No focal deficit present.     Mental Status: She is alert and oriented to person, place, and time.  Psychiatric:        Mood and Affect: Mood normal.        Behavior: Behavior normal.     (all labs ordered are listed, but only abnormal results are displayed) Labs Reviewed  COMPREHENSIVE METABOLIC PANEL WITH GFR - Abnormal; Notable for the following components:      Result Value   Potassium 3.3 (*)    Glucose,  Bld 146 (*)    AST 43 (*)    ALT 57 (*)    All other components within normal limits  MAGNESIUM  LIPASE, BLOOD  CBC WITH DIFFERENTIAL/PLATELET  CBG MONITORING, ED  TROPONIN T, HIGH SENSITIVITY  TROPONIN T, HIGH SENSITIVITY    EKG: EKG Interpretation Date/Time:  Wednesday July 30 2024 01:03:25 EST Ventricular Rate:  95 PR Interval:  40 QRS Duration:  89 QT Interval:  356 QTC Calculation: 448 R Axis:   55  Text Interpretation: Sinus rhythm  Short PR interval Abnormal R-wave progression, early transition Inferior infarct, old Confirmed by Melvenia Motto [phone]) on 07/30/2024 2:46:30 AM  Radiology: CT Angio Chest PE W and/or Wo Contrast Result Date: 07/30/2024 EXAM: CTA CHEST 07/30/2024 02:27:51 AM TECHNIQUE: CTA of the chest was performed after the administration of intravenous contrast. Multiplanar reformatted images are provided for review. MIP images are provided for review. Automated exposure control, iterative reconstruction, and/or weight based adjustment of the mA/kV was utilized to reduce the radiation dose to as low as reasonably achievable. COMPARISON: X-ray from earlier in the same day. CLINICAL HISTORY: Chest and flank pain. FINDINGS: PULMONARY ARTERIES: The pulmonary artery shows a normal branching pattern bilaterally. There is no filling defect to suggest pulmonary embolism is seen. Main pulmonary artery is normal in caliber. MEDIASTINUM: No cardiac enlargement is noted. The thoracic aorta is within normal limits. Esophagus as visualized is within normal limits. LYMPH NODES: No mediastinal, hilar or axillary lymphadenopathy. LUNGS AND PLEURA: Lungs are well aerated bilaterally. No focal infiltrate or effusion is noted. No pneumothorax. UPPER ABDOMEN: Limited images of the upper abdomen show changes from prior cholecystectomy. SOFT TISSUES AND BONES: The thoracic spine is within normal limits. No acute bony abnormality is seen. The prostate is within normal limits. IMPRESSION: 1.  No pulmonary embolism. Electronically signed by: Oneil Devonshire MD 07/30/2024 02:35 AM EST RP Workstation: [id number]   DG Chest Portable 1 View Result Date: 07/30/2024 EXAM: 1 VIEW(S) XRAY OF THE CHEST 07/30/2024 01:14:00 AM COMPARISON: 05/22/2022 CLINICAL HISTORY: FINDINGS: LUNGS AND PLEURA: Low lung volumes. Mild eventration of the right hemidiaphragm. No focal pulmonary opacity. No pulmonary edema. No pleural effusion. No pneumothorax. HEART AND MEDIASTINUM: No acute abnormality of the cardiac and mediastinal silhouettes. BONES AND SOFT TISSUES: No acute osseous abnormality. IMPRESSION: 1. Low lung volumes. 2. No acute findings. Electronically signed by: Pinkie Pebbles MD 07/30/2024 01:17 AM EST RP Workstation: [id number]     Procedures   Medications Ordered in the ED  potassium chloride  SA (KLOR-CON  M) CR tablet 40 mEq (has no administration in time range)  methocarbamol  (ROBAXIN ) tablet 1,000 mg (1,000 mg Oral Given 07/30/24 0111)  ketorolac  (TORADOL ) 15 MG/ML injection 15 mg (15 mg Intravenous Given 07/30/24 0112)  HYDROmorphone  (DILAUDID ) injection 0.5 mg (0.5 mg Intravenous Given 07/30/24 0112)  iohexol  (OMNIPAQUE ) 350 MG/ML injection 100 mL (100 mLs Intravenous Contrast Given 07/30/24 0221)                                    Medical Decision Making Amount and/or Complexity of Data Reviewed Labs: ordered. Radiology: ordered.  Risk Prescription drug management.   This patient presents to the ED for concern of left lateral chest wall pain, this involves an extensive number of treatment options, and is a complaint that carries with it a high risk of complications and morbidity.  The differential diagnosis includes muscle spasm, PE, pneumonia, pneumothorax, ACS, pleural effusion   Co morbidities / Chronic conditions that complicate the patient evaluation  HLD, depression, HTN, thyroid  cancer, sleep apnea, prediabetes, GERD, chronic pain   Additional history  obtained:  Additional history obtained from EMR External records from outside source obtained and reviewed including EMS   Lab Tests:  I Ordered, and personally interpreted labs.  The pertinent results include: Normal hemoglobin, no leukocytosis, mild hypokalemia with otherwise normal electrolytes, normal troponin   Imaging Studies ordered:  I ordered imaging studies including chest x-ray, CTA chest  I independently visualized and interpreted imaging which showed no acute findings I agree with the radiologist interpretation   Cardiac Monitoring: / EKG:  The patient was maintained on a cardiac monitor.  I personally viewed and interpreted the cardiac monitored which showed an underlying rhythm of: Sinus rhythm   Problem List / ED Course / Critical interventions / Medication management  Patient presenting for acute onset of left lateral chest wall pain.  On arrival in the ED, she is overall well-appearing but does appear uncomfortable.  She describes a sharp pain in her left lateral chest wall.  Tenderness is present on exam.  She describes a tightness in her left shoulder.  Range of motion is preserved.  Multimodal pain control was ordered.  Workup was initiated.  Initial lab results were reassuring.  On reassessment, patient's pain is controlled.  She underwent CTA which did not show any acute findings.  She had sustained resolution of her pain.  I suspect intercostal muscle spasm.  Currently, she does take hydrocodone  for chronic pain.  Will prescribe muscle relaxer and lidocaine  patches.  Patient is stable for discharge. I ordered medication including Robaxin , Toradol , Dilaudid  for analgesia; potassium chloride  for hypokalemia Reevaluation of the patient after these medicines showed that the patient improved I have reviewed the patients home medicines and have made adjustments as needed   Social Determinants of Health:  Has PCP     Final diagnoses:  Left-sided chest wall pain     ED Discharge Orders          Ordered    methocarbamol  (ROBAXIN ) 500 MG tablet  Every 8 hours PRN        07/30/24 0254    lidocaine  (LIDODERM ) 5 %  Every 24 hours        07/30/24 0254               Melvenia Motto, MD 07/30/24 [phone]

## 2024-07-30 NOTE — ED Triage Notes (Signed)
 Pt c/o left flank pain that started about an hour ago. Pt was dx with a UTI this morning. Pain radiates to her shoulder and back. Pain rated 10/10 after EMS gave 100 of fentanyl  and 4 of zofran .

## 2024-07-30 NOTE — Discharge Instructions (Signed)
 Your test results today are reassuring.  Prescriptions for lidocaine  patches and muscle relaxers were sent to your pharmacy.  Lidocaine  patches can be switched out daily.  Take muscle relaxer as needed.  Continue your other home pain medications.  Over-the-counter ibuprofen  can be helpful for this type of pain as well.  Return to the emergency department for any new or worsening symptoms of concern.

## 2024-08-08 ENCOUNTER — Encounter: Payer: Self-pay | Admitting: Hematology and Oncology

## 2024-08-16 ENCOUNTER — Ambulatory Visit
Admission: RE | Admit: 2024-08-16 | Discharge: 2024-08-16 | Disposition: A | Discharge status: Home / Self Care | Source: Ambulatory Visit | Attending: Hematology and Oncology | Admitting: Hematology and Oncology

## 2024-08-16 DIAGNOSIS — Z1501 Genetic susceptibility to malignant neoplasm of breast: Secondary | ICD-10-CM

## 2024-08-16 MED ORDER — GADOPICLENOL 0.5 MMOL/ML IV SOLN
10.0000 mL | Freq: Once | INTRAVENOUS | Status: AC | PRN
  Administered 2024-08-16: 10 mL via INTRAVENOUS

## 2024-08-26 NOTE — Progress Notes (Deleted)
   [address] [place] [address] Dept: [phone]  FOLLOW UP NOTE  Patient ID: Christine Mason, female    DOB: 1971/02/18  Age: 53 y.o. MRN: [id number] Date of Office Visit: 08/27/2024  Assessment  Chief Complaint: No chief complaint on file.  HPI ALYS Mason is a 53 year old female who presents to the clinic for follow-up visit.  She was last seen in this clinic on 03/24/2024 by Dr. Iva for evaluation of allergic rhinitis, itch, dysphagia, obstructive sleep apnea on CPAP, wheeze, reflux, and thyroid  papillary carcinoma.  Her last environmental allergy  testing was positive to grass pollen, ragweed pollen, weed pollen, indoor mold, dust mite, and cat for which she completed allergen immunotherapy between 2020 and 2024  Discussed the use of AI scribe software for clinical note transcription with the patient, who gave verbal consent to proceed.  History of Present Illness   CT Angio Chest PE W and/or Wo Contrast [[id number]] Resulted: 07/30/24 0235  Order Status: Completed Updated: 07/30/24 0237  Narrative:    EXAM: CTA CHEST 07/30/2024 02:27:51 AM  TECHNIQUE: CTA of the chest was performed after the administration of intravenous contrast. Multiplanar reformatted images are provided for review. MIP images are provided for review. Automated exposure control, iterative reconstruction, and/or weight based adjustment of the mA/kV was utilized to reduce the radiation dose to as low as reasonably achievable.  COMPARISON: X-ray from earlier in the same day.  CLINICAL HISTORY: Chest and flank pain.  FINDINGS:  PULMONARY ARTERIES: The pulmonary artery shows a normal branching pattern bilaterally. There is no filling defect to suggest pulmonary embolism is seen. Main pulmonary artery is normal in caliber.  MEDIASTINUM: No cardiac enlargement is noted. The thoracic aorta is within normal limits. Esophagus as visualized is within normal limits.  LYMPH  NODES: No mediastinal, hilar or axillary lymphadenopathy.  LUNGS AND PLEURA: Lungs are well aerated bilaterally. No focal infiltrate or effusion is noted. No pneumothorax.  UPPER ABDOMEN: Limited images of the upper abdomen show changes from prior cholecystectomy.  SOFT TISSUES AND BONES: The thoracic spine is within normal limits. No acute bony abnormality is seen. The prostate is within normal limits.  IMPRESSION: 1. No pulmonary embolism.  Electronically signed by: Oneil Devonshire MD 07/30/2024 02:35 AM EST RP Workstation: [id number]     Drug Allergies:  Allergies  Allergen Reactions   Penicillins Rash    Has patient had a PCN reaction causing immediate rash, facial/tongue/throat swelling, SOB or lightheadedness with hypotension: {Yes Has patient had a PCN reaction causing severe rash involving mucus membranes or skin necrosis:unknown Has patient had a PCN reaction that required hospitalizationNo Has patient had a PCN reaction occurring within the last 10 years:No If all of the above answers are NO, then may proceed with Cephalosporin use.     Physical Exam: LMP 05/22/2015    Physical Exam  Diagnostics:    Assessment and Plan: No diagnosis found.  No orders of the defined types were placed in this encounter.   There are no Patient Instructions on file for this visit.  No follow-ups on file.    Thank you for the opportunity to care for this patient.  Please do not hesitate to contact me with questions.  Arlean Mutter, FNP [facility]

## 2024-08-27 ENCOUNTER — Ambulatory Visit: Admitting: Family Medicine

## 2024-10-09 ENCOUNTER — Ambulatory Visit: Payer: Self-pay | Admitting: Cardiology

## 2024-10-09 NOTE — Progress Notes (Unsigned)
 " Cardiology Office Note:  .   Date:  10/09/2024  ID:  Christine Mason, DOB 1970/11/15, MRN [id number] PCP: Shona Norleen PEDLAR, MD  [facility] Providers Cardiologist:  None { Click to update primary MD,subspecialty MD or APP then REFRESH:1}  History of Present Illness: .   Christine Mason is a 54 y.o. 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  She has chronic musculoskeletal chest pain and has had a negative nuclear stress test in 2022 with a normal echocardiogram and coronary calcium  score was 0.  Risk factors have been appropriately managed.  Obesity continues to be the major issue.    Discussed the use of AI scribe software for clinical note transcription with the patient, who gave verbal consent to proceed.  History of Present Illness   Cardiac Studies relevent.   Echocardiogram 03/22/2021: Left ventricle cavity is normal in size and wall thickness. Normal global wall motion. Normal LV systolic function with EF 59%. Normal diastolic filling pattern. Mild tricuspid regurgitation. Estimated pulmonary artery systolic pressure 26 mmHg.    MYOCARDIAL PERFUSION WO LEXISCAN 04/11/2021 Exercise nuclear stress test was performed using Bruce protocol. Patient reached 6.2 METS, and 88% of age predicted maximum heart rate. Exercise capacity was low. No chest pain reported. Normal heart rate response. Hypertensive exercise response with peak BP 222/118 mmHg.  Stress EKG revealed no ischemic changes. Decreased tracer uptake in inferior myocardium at rest and stress images likely due to breast attenuation-imaging performed in sitting position. Stress LVEF 74% with no wall motion and thickening abnormality. Low risk study.   Coronary calcium  score  04/02/2021: Total Agatston Score: 0. MESA database percentile: 0  EKG:      Labs   Lab Results  Component Value Date   CHOL 174 06/27/2022   HDL 63 06/27/2022   LDLCALC 89 06/27/2022   TRIG 126 06/27/2022   CHOLHDL 2.8 06/27/2022   No results found for: LIPOA  Recent Labs    12/11/23 2012 07/30/24 0122  NA 138 138  K 3.8 3.3*  CL 99 100  CO2 25 25  GLUCOSE 100* 146*  BUN 13 13  CREATININE 0.75 0.65  CALCIUM  9.7 9.4  GFRNONAA >60 >60    Lab Results  Component Value Date   ALT 57 (H) 07/30/2024   AST 43 (H) 07/30/2024   ALKPHOS 76 07/30/2024   BILITOT 0.4 07/30/2024      Latest Ref Rng & Units 07/30/2024    1:22 AM 12/11/2023    8:12 PM 02/20/2023   10:07 AM  CBC  WBC 4.0 - 10.5 K/uL 7.0  9.7  9.4   Hemoglobin 12.0 - 15.0 g/dL 86.8  86.6  87.2   Hematocrit 36.0 - 46.0 % 39.5  41.2  39.5   Platelets 150 - 400 K/uL 312  291  325    Lab Results  Component Value Date   HGBA1C 5.6 06/27/2022    Lab Results  Component Value Date   TSH 1.41 01/23/2024    Care everywhere/Faxed External Labs:  ***  ROS  ***ROS Physical Exam:   VS:  LMP 05/22/2015    Wt Readings from Last 3 Encounters:  07/30/24 (!) 345 lb (156.5 kg)  05/15/24 (!) 346 lb 14.4 oz (157.4 kg)  03/24/24 (!) 346 lb 2 oz (157 kg)    BP  Readings from Last 3 Encounters:  07/30/24 (!) 105/46  05/15/24 (!) 155/87  03/24/24 132/82   ***Physical Exam  ASSESSMENT AND PLAN: .      ICD-10-CM   1. Primary hypertension  I10     2. Pure hypercholesterolemia  E78.00     3. Class 3 severe obesity due to excess calories without serious comorbidity with body mass index (BMI) of 50.0 to 59.9 in adult [facility])  Z33.186    Z68.43      Assessment & Plan   Follow up: ***  Signed,  Gordy Bergamo, MD, [facility] 10/09/2024, 7:11 AM [facility] [address] [place], [place] [address] Phone: [phone]. Fax:  [phone]  "

## 2024-10-16 ENCOUNTER — Telehealth: Payer: Self-pay | Admitting: Cardiology

## 2024-10-16 ENCOUNTER — Other Ambulatory Visit (HOSPITAL_COMMUNITY): Payer: Self-pay

## 2024-10-16 NOTE — Telephone Encounter (Signed)
 Called pt she states her PCP would like to know why she needs to keep taking all three blood pressure medications. Pt provided fax number to send the notes to.  Notes sent via Epic fax.

## 2024-10-16 NOTE — Telephone Encounter (Signed)
 Pt states Dr. Ladona cleared her and advised her to continue refills from PCP. PCP office is requesting fax of last visits notes. Please advise.   Christyne Hurst, MD [phone]

## 2024-10-21 ENCOUNTER — Other Ambulatory Visit (HOSPITAL_COMMUNITY): Payer: Self-pay

## 2024-10-21 MED ORDER — SPIRONOLACTONE-HCTZ 25-25 MG PO TABS
1.0000 | ORAL_TABLET | Freq: Every day | ORAL | 0 refills | Status: AC
  Filled 2024-10-21: qty 90, 90d supply, fill #0

## 2024-10-22 ENCOUNTER — Other Ambulatory Visit: Payer: Self-pay

## 2024-10-22 ENCOUNTER — Encounter: Payer: Self-pay | Admitting: Pharmacist

## 2024-10-22 ENCOUNTER — Other Ambulatory Visit (HOSPITAL_COMMUNITY): Payer: Self-pay

## 2024-10-22 ENCOUNTER — Encounter (HOSPITAL_COMMUNITY): Payer: Self-pay

## 2024-10-23 ENCOUNTER — Other Ambulatory Visit (HOSPITAL_COMMUNITY): Payer: Self-pay

## 2024-12-12 ENCOUNTER — Ambulatory Visit: Admitting: Cardiology

## 2025-05-14 ENCOUNTER — Ambulatory Visit: Admitting: Hematology and Oncology

## 2025-05-15 ENCOUNTER — Ambulatory Visit: Admitting: Hematology and Oncology
